# Patient Record
Sex: Female | Born: 1935 | Race: White | Hispanic: No | Smoking: Never smoker
Health system: Southern US, Community
[De-identification: ages and names within clinical notes are randomized; demographics above are authoritative.]

## PROBLEM LIST (undated history)

## (undated) DIAGNOSIS — F410 Panic disorder [episodic paroxysmal anxiety] without agoraphobia: Secondary | ICD-10-CM

## (undated) DIAGNOSIS — I10 Essential (primary) hypertension: Secondary | ICD-10-CM

## (undated) DIAGNOSIS — C801 Malignant (primary) neoplasm, unspecified: Secondary | ICD-10-CM

## (undated) DIAGNOSIS — E119 Type 2 diabetes mellitus without complications: Secondary | ICD-10-CM

## (undated) DIAGNOSIS — E785 Hyperlipidemia, unspecified: Secondary | ICD-10-CM

## (undated) DIAGNOSIS — Z973 Presence of spectacles and contact lenses: Secondary | ICD-10-CM

## (undated) DIAGNOSIS — I471 Supraventricular tachycardia, unspecified: Secondary | ICD-10-CM

## (undated) DIAGNOSIS — E039 Hypothyroidism, unspecified: Secondary | ICD-10-CM

## (undated) HISTORY — PX: ABDOMINAL HYSTERECTOMY: SHX81

## (undated) HISTORY — DX: Hypothyroidism, unspecified: E03.9

## (undated) HISTORY — PX: RECONSTRUCTION BREAST W/ LATISSIMUS DORSI FLAP: SUR1078

## (undated) HISTORY — DX: Essential (primary) hypertension: I10

## (undated) HISTORY — DX: Panic disorder (episodic paroxysmal anxiety): F41.0

## (undated) HISTORY — DX: Malignant (primary) neoplasm, unspecified: C80.1

## (undated) HISTORY — DX: Supraventricular tachycardia: I47.1

## (undated) HISTORY — DX: Supraventricular tachycardia, unspecified: I47.10

## (undated) HISTORY — PX: APPENDECTOMY: SHX54

## (undated) HISTORY — PX: CHOLECYSTECTOMY: SHX55

---

## 1983-12-20 HISTORY — PX: MASTECTOMY: SHX3

## 2000-09-08 ENCOUNTER — Other Ambulatory Visit: Admission: RE | Admit: 2000-09-08 | Discharge: 2000-09-08 | Payer: Self-pay | Admitting: Obstetrics and Gynecology

## 2001-08-30 ENCOUNTER — Ambulatory Visit (HOSPITAL_COMMUNITY): Admission: RE | Admit: 2001-08-30 | Discharge: 2001-08-30 | Payer: Self-pay | Admitting: Gastroenterology

## 2001-08-30 ENCOUNTER — Encounter (INDEPENDENT_AMBULATORY_CARE_PROVIDER_SITE_OTHER): Payer: Self-pay | Admitting: Specialist

## 2003-05-20 ENCOUNTER — Ambulatory Visit (HOSPITAL_COMMUNITY): Admission: RE | Admit: 2003-05-20 | Discharge: 2003-05-20 | Payer: Self-pay | Admitting: Obstetrics and Gynecology

## 2003-05-20 ENCOUNTER — Encounter: Payer: Self-pay | Admitting: Obstetrics and Gynecology

## 2003-05-27 ENCOUNTER — Ambulatory Visit (HOSPITAL_COMMUNITY): Admission: RE | Admit: 2003-05-27 | Discharge: 2003-05-27 | Payer: Self-pay | Admitting: Obstetrics and Gynecology

## 2003-05-27 ENCOUNTER — Encounter: Payer: Self-pay | Admitting: Obstetrics and Gynecology

## 2005-10-26 ENCOUNTER — Ambulatory Visit: Payer: Self-pay | Admitting: Cardiology

## 2005-11-03 ENCOUNTER — Ambulatory Visit: Payer: Self-pay

## 2005-11-03 ENCOUNTER — Encounter: Payer: Self-pay | Admitting: Cardiology

## 2006-07-21 ENCOUNTER — Ambulatory Visit: Payer: Self-pay | Admitting: Cardiology

## 2007-05-01 ENCOUNTER — Ambulatory Visit: Payer: Self-pay | Admitting: Cardiology

## 2007-08-16 ENCOUNTER — Ambulatory Visit: Payer: Self-pay

## 2007-08-16 ENCOUNTER — Ambulatory Visit: Payer: Self-pay | Admitting: Cardiology

## 2007-09-17 ENCOUNTER — Ambulatory Visit: Payer: Self-pay | Admitting: Cardiology

## 2007-10-31 ENCOUNTER — Ambulatory Visit: Payer: Self-pay | Admitting: Cardiology

## 2007-10-31 LAB — CONVERTED CEMR LAB
BUN: 21 mg/dL (ref 6–23)
GFR calc Af Amer: 91 mL/min
GFR calc non Af Amer: 76 mL/min
Potassium: 3.9 meq/L (ref 3.5–5.1)
Sodium: 143 meq/L (ref 135–145)

## 2007-11-02 ENCOUNTER — Ambulatory Visit: Payer: Self-pay | Admitting: Cardiology

## 2008-01-30 ENCOUNTER — Ambulatory Visit: Payer: Self-pay | Admitting: Cardiology

## 2008-10-17 ENCOUNTER — Ambulatory Visit: Payer: Self-pay | Admitting: Cardiology

## 2008-10-29 ENCOUNTER — Ambulatory Visit: Payer: Self-pay | Admitting: Cardiology

## 2009-06-17 ENCOUNTER — Telehealth: Payer: Self-pay | Admitting: Cardiology

## 2009-10-16 DIAGNOSIS — I471 Supraventricular tachycardia, unspecified: Secondary | ICD-10-CM | POA: Insufficient documentation

## 2009-10-16 DIAGNOSIS — E039 Hypothyroidism, unspecified: Secondary | ICD-10-CM | POA: Insufficient documentation

## 2009-10-16 DIAGNOSIS — I10 Essential (primary) hypertension: Secondary | ICD-10-CM

## 2009-10-19 ENCOUNTER — Ambulatory Visit: Payer: Self-pay | Admitting: Cardiology

## 2009-10-19 DIAGNOSIS — E663 Overweight: Secondary | ICD-10-CM | POA: Insufficient documentation

## 2010-01-11 ENCOUNTER — Telehealth: Payer: Self-pay | Admitting: Cardiology

## 2011-01-20 NOTE — Progress Notes (Signed)
Summary: refill--metorpolol  Phone Note Refill Request   Refills Requested: Medication #1:  METOPROLOL TARTRATE 50 MG TABS take one tablet two times a day   Supply Requested: 3 months [facility] on [address]   Method Requested: Fax to Local Pharmacy Initial call taken by: Migdalia Dk,  January 11, 2010 9:18 AM  Follow-up for Phone Call        Rx sent into pharmacy. LMOM for pt. Marrion Coy, CNA  January 11, 2010 12:32 PM  Follow-up by: Marrion Coy, CNA,  January 11, 2010 12:32 PM    Prescriptions: METOPROLOL TARTRATE 50 MG TABS (METOPROLOL TARTRATE) take one tablet two times a day  #180 x 2   Entered by:   Marrion Coy, CNA   Authorized by:   Rollene Rotunda, MD, [facility]   Signed by:   Marrion Coy, CNA on 01/11/2010   Method used:   Electronically to        [facility]  [phone]* (retail)       [address]       [place], [place]  [address]       Ph: [phone] or [phone]       Fax: [phone]   RxID:   [id number]

## 2011-01-26 ENCOUNTER — Other Ambulatory Visit: Payer: Self-pay | Admitting: Family Medicine

## 2011-01-26 DIAGNOSIS — M8580 Other specified disorders of bone density and structure, unspecified site: Secondary | ICD-10-CM

## 2011-02-14 ENCOUNTER — Ambulatory Visit
Admission: RE | Admit: 2011-02-14 | Discharge: 2011-02-14 | Disposition: A | Discharge status: Home / Self Care | Payer: Medicare Other | Source: Ambulatory Visit | Attending: Family Medicine | Admitting: Family Medicine

## 2011-02-14 DIAGNOSIS — M8580 Other specified disorders of bone density and structure, unspecified site: Secondary | ICD-10-CM

## 2011-03-03 ENCOUNTER — Telehealth: Payer: Self-pay | Admitting: Cardiology

## 2011-03-08 NOTE — Progress Notes (Signed)
Summary: refill request  Phone Note Refill Request Message from:  Patient on March 03, 2011 11:13 AM  Refills Requested: Medication #1:  METOPROLOL TARTRATE 50 MG TABS take one tablet two times a day [facility]   Method Requested: Telephone to Pharmacy Initial call taken by: Glynda Jaeger,  March 03, 2011 11:13 AM    Prescriptions: METOPROLOL TARTRATE 50 MG TABS (METOPROLOL TARTRATE) take one tablet two times a day  #180 x 2   Entered by:   Hardin Negus, RMA   Authorized by:   Rollene Rotunda, MD, [facility]   Signed by:   Hardin Negus, RMA on 03/03/2011   Method used:   Electronically to        [facility]  [phone]* (retail)       [address]       [place], [place]  [address]       Ph: [phone] or [phone]       Fax: [phone]   RxID:   [id number]

## 2011-04-09 ENCOUNTER — Encounter: Payer: Self-pay | Admitting: Cardiology

## 2011-04-12 ENCOUNTER — Ambulatory Visit (INDEPENDENT_AMBULATORY_CARE_PROVIDER_SITE_OTHER): Payer: Medicare Other | Admitting: Cardiology

## 2011-04-12 ENCOUNTER — Encounter: Payer: Self-pay | Admitting: Cardiology

## 2011-04-12 DIAGNOSIS — E663 Overweight: Secondary | ICD-10-CM

## 2011-04-12 DIAGNOSIS — I498 Other specified cardiac arrhythmias: Secondary | ICD-10-CM

## 2011-04-12 DIAGNOSIS — I10 Essential (primary) hypertension: Secondary | ICD-10-CM

## 2011-04-12 NOTE — Assessment & Plan Note (Signed)
The blood pressure continues to be high. I have instructed the patient to record a blood pressure diary and recording this. This will be presented for my review and pending these results I will make further suggestions about changes in therapy for optimal blood pressure control.  

## 2011-04-12 NOTE — Patient Instructions (Signed)
Follow up in 6 months with Dr. Hochrein. 

## 2011-04-12 NOTE — Assessment & Plan Note (Addendum)
The patient understands the need to lose weight with diet and exercise. We have discussed specific strategies for this.  We had a very long discussion about the specifics of this I think she is motivated to change. She would like six-month followup to discuss her progress and further plans.

## 2011-04-12 NOTE — Progress Notes (Signed)
HPI The patient presents for followup of hypertension and cardiovascular risk factors. Since I last saw her she has had no new cardiovascular complaints. She has apparently been found to be hyperglycemic. She had her dose of amlodipine reduced because of fatigue. She is doing some exercise but has just gotten back to this. She is not having any chest pressure, neck or arm discomfort. She is not having any palpitations, presyncope or syncope. She is not having any weight gain or edema. Unfortunately she hasn't lost weight and she admits to this being lifestyle with diet in particular.  Allergies  Allergen Reactions  . Penicillins     Current Outpatient Prescriptions  Medication Sig Dispense Refill  . amLODipine (NORVASC) 5 MG tablet Take 5 mg by mouth daily.        Marland Kitchen glimepiride (AMARYL) 1 MG tablet daily      . levothyroxine (SYNTHROID, LEVOTHROID) 88 MCG tablet Daily       . lisinopril-hydrochlorothiazide (PRINZIDE,ZESTORETIC) 20-25 MG per tablet daily      . metFORMIN (GLUCOPHAGE-XR) 500 MG 24 hr tablet Twice daily.      . metoprolol (LOPRESSOR) 50 MG tablet Take 50 mg by mouth 2 (two) times daily.        . pravastatin (PRAVACHOL) 40 MG tablet daily      . DISCONTD: amLODipine (NORVASC) 10 MG tablet Take 10 mg by mouth daily.        Marland Kitchen DISCONTD: hydrochlorothiazide (,MICROZIDE/HYDRODIURIL,) 12.5 MG capsule Take 12.5 mg by mouth daily.          Past Medical History  Diagnosis Date  . Hypertension   . Hypothyroidism   . Panic attacks     mild  . SVT (supraventricular tachycardia)     in the past  . Hx of hysterectomy     Past Surgical History  Procedure Date  . Appendectomy   . Cholecystectomy   . Mastectomy     ROS:  As stated in the HPI and negative for all other systems.  PHYSICAL EXAM BP 140/90  Pulse 98  Ht 5\' 7"  (1.702 m)  Wt 225 lb 6.4 oz (102.241 kg)  BMI 35.30 kg/m2 GENERAL:  Well appearing HEENT:  Pupils equal round and reactive, fundi not visualized, oral  mucosa unremarkable NECK:  No jugular venous distention, waveform within normal limits, carotid upstroke brisk and symmetric, no bruits, no thyromegaly LYMPHATICS:  No cervical, inguinal adenopathy LUNGS:  Clear to auscultation bilaterally BACK:  No CVA tenderness CHEST:  Unremarkable HEART:  PMI not displaced or sustained,S1 and S2 within normal limits, no S3, no S4, no clicks, no rubs, no murmurs ABD:  Flat, positive bowel sounds normal in frequency in pitch, no bruits, no rebound, no guarding, no midline pulsatile mass, no hepatomegaly, no splenomegaly EXT:  2 plus pulses throughout, no edema, no cyanosis no clubbing SKIN:  No rashes no nodules NEURO:  Cranial nerves II through XII grossly intact, motor grossly intact throughout PSYCH:  Cognitively intact, oriented to person place and time  AVW:UJWJX rhythm, rate 98, right axis deviation, first degree AV block, no acute ST-T wave changes  ASSESSMENT AND PLAN

## 2011-04-12 NOTE — Assessment & Plan Note (Signed)
She is having no episodes of this.  No change in therapy is indicated

## 2011-05-03 NOTE — Assessment & Plan Note (Signed)
[facility]                            CARDIOLOGY OFFICE NOTE   Michaela Rhodes, Michaela Rhodes                      MRN:          [id number]  DATE:11/02/2007                            DOB:          1936/07/22    PRIMARY CARE PHYSICIAN:  Molly Maduro L. Foy Guadalajara, M.D.   REASON FOR PRESENTATION:  Evaluate patient with hypertension.   HISTORY OF PRESENT ILLNESS:  Patient is a pleasant 75 year old with  difficult-to-control hypertension.  At the last appointment, I added  hydrochlorothiazide 12.5 mg daily.  She has been keeping a blood  pressure diary, and it has been very well controlled.  She has had some  swings into the 150s, but for the most part, it seems to be 120s-130s.  She has not had any lightheadedness.  She has had no presyncope or  syncope.  She has had no chest discomfort.   PAST MEDICAL HISTORY:  1. Hypertension x10 years.  2. Hypothyroidism.  3. Mild panic attacks.  4. Supraventricular tachycardia.  5. Hysterectomy.  6. Appendectomy in the 1940s.  7. Cholecystectomy in 1961.  8. Breast cancer, resected, with a left mastectomy in 1986.   ALLERGIES:  PENICILLIN.   MEDICATIONS:  1. Metoprolol 50 mg b.i.d.  2. Amlodipine 5 mg daily.  3. Hydrochlorothiazide 12.5 mg daily.   REVIEW OF SYSTEMS:  As stated in the HPI, otherwise negative for other  systems.   PHYSICAL EXAMINATION:  Patient is in no distress.  Blood pressure 147/85, heart rate 71 and regular.  Weight 214 pounds.  Body Mass Index 33.  HEENT:  Eyelids unremarkable.  Pupils are equal, round and reactive to  light.  Fundi not visualized.  Oral mucosa unremarkable.  NECK:  No jugular venous distention at 45 degrees.  Carotid upstroke  brisk and symmetric.  No bruits, no thyromegaly.  LUNGS:  Clear to auscultation bilaterally.  HEART:  PMI not displaced or sustained.  S1 and S2 within normal limits.  No S3, no S4, no clicks, rubs, or murmurs.  ABDOMEN:  Flat, positive bowel sounds.   Normal in frequency and pitch.  No bruits, rebound, guarding.  There are no midline pulsatile masses.  No organomegaly.  EXTREMITIES:  Pulses 2+ throughout.  No edema.   EKG:  Sinus rhythm.  Rate 70.  Axis within normal limits.  Intervals  within normal limits.  No acute ST-T wave changes.   ASSESSMENT/PLAN:  1. Hypertension:  Blood pressure is much better controlled.  At this      point, I will continue the medicines as listed.  She did have labs      demonstrating that she is tolerating hydrochlorothiazide from a      potassium and creatinine standpoint.  2. Followup:  I will bring her back in January for an exercise      treadmill test.     Rollene Rotunda, MD, [facility]  Electronically Signed    JH/MedQ  DD: 11/02/2007  DT: 11/03/2007  Job #: [id number]   cc:   Molly Maduro L. Foy Guadalajara, M.D.

## 2011-05-03 NOTE — Assessment & Plan Note (Signed)
[facility]                            CARDIOLOGY OFFICE NOTE   Michaela Rhodes, Michaela Rhodes                      MRN:          [id number]  DATE:10/17/2008                            DOB:          12-07-1936    PRIMARY CARE PHYSICIAN:  Molly Maduro L. Foy Guadalajara, MD   REASON FOR PRESENTATION:  Evaluate the patient with hypertension.   HISTORY OF PRESENT ILLNESS:  The patient is a now pleasant 75 year old  woman who presents for followup of her hypertension.  Since I last saw  her, she says her blood pressure has been well controlled.  It is  typically in 130s/80s.  It is never over 140 or 90.  She is tolerating  the medicines as listed.  She has had no chest pain or shortness of  breath.  She has not been as active, she use to be.  She cut her finger  and was in a cast for several weeks and so stopped swimming.  She is yet  to get back into this.   Of note, today her EKG demonstrates an atrial tachycardia.  Her rate is  101.  She says she does not feel this.  She does not have any presyncope  or syncope.   PAST MEDICAL HISTORY:  1. Hypertension x10 years.  2. Hypothyroidism.  3. Mild panic attacks.  4. Supraventricular tachycardia in the past.  5. Hysterectomy.  6. Appendectomy in 1940s.  7. Cholecystectomy in 1961.  8. Breast cancer resected with left mastectomy in 1986.   ALLERGIES:  PENICILLIN.   MEDICATIONS:  1. Metoprolol 50 mg twice a day.  2. Amlodipine 5 mg daily.  3. Hydrochlorothiazide 12.5 mg daily.  4. Synthroid 75 mcg daily.   REVIEW OF SYSTEMS:  As stated in the HPI and otherwise negative per  other systems.   PHYSICAL EXAMINATION:  GENERAL:  The patient is a pleasant and in no  distress.  VITAL SIGNS:  Weight 124 pounds (up 10 pounds since her last visit),  blood pressure 139/81, heart rate 101 and regular.  HEENT:  Eyelids are unremarkable, pupils equal, round, and reactive to  light, fundi not visualized, oral mucosa is  unremarkable.  NECK:  No jugular venous distention at 45 degrees, carotid upstroke  brisk and symmetrical, no bruits, no thyromegaly.  LYMPHATICS:  No cervical, axillary, or inguinal adenopathy.  LUNGS:  Clear to auscultation bilaterally.  BACK:  No costovertebral angle tenderness.  CHEST:  Unremarkable.  HEART:  PMI not displaced or sustained, S1 and S2 within normal, no S3,  no S4, no clicks, no rubs, no murmurs.  ABDOMEN:  Flat, positive bowel sounds, normal in frequency and pitch, no  bruits, no rebound, no guarding, no midline pulsatile mass,  no hepatomegaly, no splenomegaly.  SKIN:  No rashes, no nodules.  EXTREMITIES:  Pulses throughout 2+, no edema, no cyanosis, no clubbing.  NEUROLOGIC:  Oriented to person, place, and time, cranial nerves II-XII  grossly intact, motor grossly intact.   EKG sinus rhythm that evolves into the atrial tachycardia with regular  rhythm, the QT appears to be prolonged but  this could represent some of  the infusion of the T-wave and flattening of the QT.  Sinus rhythm with  QT is not prolonged, there is nonspecific T-wave flattening.   ASSESSMENT AND PLAN:  1. Hypertension.  Blood pressure is well controlled.  She will      continue the medications as listed.  2. Obesity.  She has gained weight and she has been inactive.  She is      encouraged to go back to swimming and other activities and try to      bring her weight down.  3. Tachycardia.  I will place 24-hour Holter monitor to make sure she      has no other dysrhythmias.  4. Prolonged QT.  I really think this is an artifact of the      arrhythmia.  Earlier in the EKG when she is in sinus rhythm, her QT      is not particularly prolong.  She does have a first-degree AV      block.  She has not had any tachy palpitations or syncope to      suggest a long QT syndrome.  Previous EKGs have not suggesting the      QT to be prolonged.  We will follow this with EKGs in the future      and I can  also measure on the monitor.  5. Hypothyroidism.  She remains on Synthroid and has this followed.  6. Followup.  We will see her back in 1 year, sooner if she has      anything on Holter that needs to be dressed or any symptomatic      tachy palpitations.     Rollene Rotunda, MD, [facility]  Electronically Signed    JH/MedQ  DD: 10/17/2008  DT: 10/18/2008  Job #: [id number]   cc:   Molly Maduro L. Foy Guadalajara, M.D.

## 2011-05-03 NOTE — Assessment & Plan Note (Signed)
[facility]                            CARDIOLOGY OFFICE NOTE   Michaela Rhodes, Michaela Rhodes                      MRN:          [id number]  DATE:08/16/2007                            DOB:          08-31-1936    PRIMARY CARE PHYSICIAN:  Dr. Marinda Elk.   REASON FOR PRESENTATION:  Evaluate patient with some palpitations and  hypertension.   HISTORY OF PRESENT ILLNESS:  The patient returns for follow up of the  above. At the last appointment, I did take the liberty of increasing her  beta blocker. She has had better blood pressure control following that.  Between this and being off of Synthroid, she has actually had no tachy-  palpitations since I saw her. She was coming in today for an exercise  treadmill test to see if we could induce any of her arrhythmias. She was  anxious about exercising. However, on hooking her up, she was quite  hypertensive. Initially, she was 156/106. I had her repeat this and she  went up to 195/110. She does report that systolics are in the 150s at  home, but diastolics are well controlled. As mentioned, she is not  having any tachy-palpitations. She has had no pre-syncope or syncope.  She has not been having any shortness of breath. She is trying to do a  little water aerobics. She says that when she does exercise, she gets a  musculoskeletal discomfort, but she relates this to breast cancer and  mastectomy where they repaired her chest with muscles from her back. It  feels more like a pulling muscular discomfort.   PAST MEDICAL HISTORY:  Hypertension x10 years, hypothyroidism, mild  panic attacks, supraventricular tachycardia, hysterectomy, appendectomy  in the 1940s, cholecystectomy in 1961, breast cancer resected with left  mastectomy in 1986.   ALLERGIES:  PENICILLIN.   CURRENT MEDICATIONS:  Metoprolol 50 mg b.i.d.   REVIEW OF SYSTEMS:  As stated in the HPI and otherwise negative for  other systems.   PHYSICAL  EXAMINATION:  GENERAL:  The patient is in no distress.  VITAL SIGNS:  Blood pressure 195/100, heart rate 92 and regular.  HEENT:  Eyes unremarkable, pupils equal, round, and reactive to light,  fundi not visualized, oral mucosa unremarkable.  NECK:  No jugular vein distension at 45 degrees, carotid upstroke brisk  and symmetric, no bruits, no thyromegaly.  LUNGS:  Clear to auscultation bilaterally.  HEART:  PMI not displaced or sustained, S1 and S2 within normal limits,  no S3, no S4, no murmurs  ABDOMEN:  Flat, positive bowel sounds, normal to frequency and pitch,  midline bruit loud, no midline pulsatile mass, no hepatomegaly.  SKIN:  No rashes, no nodules.  EXTREMITIES:  2+ pulses, no edema.   EKG:  Sinus rhythm, rate 92, no acute STT wave changes.   ASSESSMENT AND PLAN:  1. Hypertension. The patient's blood pressure is too elevated to do      the treadmill test today. I have taken the liberty of adding      amlodipine 5 mg daily to her regimen. She is going to keep  a blood      pressure check at home. I would like to try to do a treadmill test      again in the future, but not until we have seen her blood pressure      better controlled.  2. Palpitations. She is no longer having these. These can be managed      symptomatically.  3. Follow up. I will see her back with a treadmill test. I will      schedule her in about 6 weeks for a blood pressure check first.     Rollene Rotunda, MD, [facility]  Electronically Signed    JH/MedQ  DD: 08/16/2007  DT: 08/17/2007  Job #: [id number]   cc:   Molly Maduro L. Foy Guadalajara, M.D.

## 2011-05-03 NOTE — Assessment & Plan Note (Signed)
[facility]                            CARDIOLOGY OFFICE NOTE   Michaela Rhodes, Michaela Rhodes                      MRN:          [id number]  DATE:05/01/2007                            DOB:          09/06/1936    REFERRING PHYSICIAN:  Molly Maduro L. Foy Guadalajara, M.D.   REASON FOR PRESENTATION:  Evaluate patient with supraventricular  tachycardia and hypertension.   HISTORY OF PRESENT ILLNESS:  Patient returns for followup.  It has been  a little over a year.  At the initial visit, she had a rapid heart rate.  I put a Holter monitor on her, which demonstrated sinus rhythm and runs  of supraventricular tachycardia.  I managed this with metoprolol 25 mg  b.i.d.  She also had her Synthroid stopped by her primary doctor.  She  has not noticed any symptomatic tachy palpitations.  She has not had any  presyncope or syncope.  She has had no chest discomfort, neck or arm  discomfort.  She has had no shortness of breath, PND, or orthopnea.  She  has not been as active as we would hope.  She stopped working at [employer],  and so does not walk as much in a given day.  She has been a little bit  afraid to because of increased heart rate as well.   PAST MEDICAL HISTORY:  1. Hypertension x10 years, controlled.  2. Hypothyroidism, treated.  3. Mild panic attacks.  4. Supraventricular tachycardia.  5. Hysterectomy.  6. Appendectomy in the 1940s.  7. Cholecystectomy in 1961.  8. Breast cancer resected with left mastectomy in 1986.   ALLERGIES:  PENICILLIN.   MEDICATIONS:  Metoprolol 25 mg b.i.d.   REVIEW OF SYSTEMS:  As stated in the HPI, and otherwise negative for  other systems.   PHYSICAL EXAMINATION:  The patient is in no distress.  Blood pressure 170/110.  Heart rate 70 and regular.  Weight 200 pounds.  Body mass index 33.  HEENT:  Eyes unremarkable.  Pupils equal, round, and reactive to light.  Fundi not visualized.  Oral mucosa unremarkable.  NECK:  No jugular venous  distention.  Wave form within normal limits.  Carotid upstroke brisk and symmetric.  No bruits.  No thyromegaly.  LYMPHATICS:  No cervical, axillary, or inguinal adenopathy.  LUNGS:  Clear to auscultation bilaterally.  BACK:  No costovertebral angle tenderness.  CHEST:  Unremarkable.  HEART:  PMI not displaced or sustained.  S1 and S2 within normal limits.  No S3.  No S4.  No clicks.  No rubs.  No murmurs.  ABDOMEN:  Obese.  Positive bowel sounds.  Normal in frequency and pitch.  No bruits.  No rebound.  No guarding.  No midline pulsatile mass.  No  hepatomegaly.  No splenomegaly.  SKIN:  No rashes.  No nodules.  EXTREMITIES:  Two plus pulses throughout.  No edema.  No cyanosis.  No  clubbing.  NEUROLOGIC:  Oriented to person, place, and time.  Cranial nerves 2  through 12 grossly intact.  Motor grossly intact.   ASSESSMENT AND PLAN:  1. Tachycardia.  The patient  is not having any symptomatic paroxysms      of this.  She did go quickly just when I was listening to her.  She      had been leery of exercising because of this.  I am going to bring      her back and do an exercise treadmill test.  This will be on a      higher dose of beta blocker, 50 mg b.i.d.  This will allow [place] to      look at her heart rate and blood pressure, as well as give her a      prescription for exercise.  2. Hypertension.  This will be managed as above with the increase of      her beta blocker to 50 mg b.i.d.  3. Followup.  We will see the patient back at the time of her      treadmill.  She wants to come back in early July.     Rollene Rotunda, MD, [facility]     JH/MedQ  DD: 05/01/2007  DT: 05/01/2007  Job #: [id number]   cc:   Molly Maduro L. Foy Guadalajara, M.D.

## 2011-05-03 NOTE — Procedures (Signed)
[name]                              EXERCISE TREADMILL   NAME:Michaela Rhodes, Michaela Rhodes                      MRN:          [id number]  DATE:01/30/2008                            DOB:          [birth year]    PROCEDURE:  Exercise treadmill test.   INDICATION:  Evaluate patient with tachycardia, cardiovascular risk  factors and dyspnea.   PROCEDURAL NOTE:  The patient was exercised using standard Bruce  protocol.  Very soon after being put on the he treadmill, she went from  a heart rate of 93 sinus with first degree AV block to a tachycardia  that appeared to have a somewhat gradual onset, but an abrupt end.  It  may have been a reentrant atrial tachycardia.  We stopped the procedure.  The rate at that time was approximately 145, which was above her target.  Again, the rhythm broke very soon after we stopped walking.  We  restarted and saw the same phenomenon.  However, this time the  resolution of the tachycardia was more gradual.  As we watched her at  rest, she had bouts of atrial tachycardia nonsustained.  She never had  any symptoms with any of these.  She did not have any shortness of  breath or chest discomfort.  When she was at her peak heart rate, which  was 142 beats a minute, she had an appropriate blood pressure response  of 175/57.  There were no ischemic ST-T wave changes.  She did have  normal slowing of her heart rate at rest.  There were no premature  ventricular contractions.  The patient overall exercised for only 2  minutes and 59 seconds.   ASSESSMENT/PLAN:  This is technically a negative test as she achieved  target heart rate.  However, it is not particularly helpful in  determining risk as she was exercised for such a short period of time.  Rather this points out the difficulties of her tachyarrhythmia , which  remain predominantly asymptomatic.  She did have a hypertensive blood  pressure response.  At this point I am going to  increase her metoprolol  from 50 twice a day to 100 mg twice a day.  I plan to get a Holter  monitor in one month.  I will then see her after that.     Rollene Rotunda, MD, [facility]  Electronically Signed    JH/MedQ  DD: 01/30/2008  DT: 01/31/2008  Job #: [id number]   cc:   Katrina Stack, Dr.

## 2011-05-03 NOTE — Assessment & Plan Note (Signed)
[facility]                            CARDIOLOGY OFFICE NOTE   Michaela Rhodes, Michaela Rhodes                      MRN:          [id number]  DATE:09/17/2007                            DOB:          10/07/1936    PRIMARY:  Dr. Marinda Elk   REASON FOR PRESENTATION:  Evaluate patient with palpitations and  hypertension.   HISTORY OF PRESENT ILLNESS:  The patient is a 75 year old white female  with difficult-to-control hypertension.  At the last visit I was trying  to do a treadmill test on her but her blood pressure would not allow  this.  I started amlodipine 5 mg.  She has been keeping a blood pressure  diary at home.  Her blood pressure is better controlled but still not at  target.  She is running in the 140s to 160s systolic and her diastolics  are typically in the 90s.  She is not noticing any new palpitations.  She has had no presyncope or syncope.  She has had no chest discomfort,  neck or arm discomfort.  She had had no shortness of breath, PND or  orthopnea.  She tolerated the amlodipine and has not had any lower  extremity swelling.   PAST MEDICAL HISTORY:  1. Hypertension x10 years.  2. Hypothyroidism.  3. Mild panic attacks.  4. Supraventricular tachycardia.  5. Hysterectomy.  6. Appendectomy in the 1940s.  7. Cholecystectomy in 1961.  8. Breast cancer resected with left mastectomy in 1986.   ALLERGIES:  PENICILLIN.   MEDICATIONS:  1. Metoprolol 50 mg b.i.d.  2. Amlodipine 5 mg daily.   REVIEW OF SYSTEMS:  As stated in the HPI and otherwise negative for  other systems.   PHYSICAL EXAMINATION:  The patient is in no distress.  Weight is 212  pounds, body mass index 33, blood pressure 140/100, heart rate 80 and  regular.  HEENT:  Eyelids unremarkable.  Pupils equal, round, and react to light.  Fundi not visualized.  Oral mucosa unremarkable.  NECK:  No jugular venous distention at 45 degrees, carotid upstroke  brisk and symmetric, no  bruits, no thyromegaly.  LYMPHATICS:  No cervical, axillary or inguinal adenopathy.  LUNGS:  Clear to auscultation bilaterally.  BACK:  No costovertebral angle tenderness.  CHEST:  Unremarkable.  HEART:  PMI not displaced or sustained.  S1 and S2 within normal limits.  No S3, no S4.  No clicks, rubs or murmurs.  ABDOMEN:  Obese, positive bowel sounds normal in frequency and pitch.  No bruits, no rebound, no guarding.  No midline pulsatile mass.  No  hepatomegaly, no splenomegaly.  SKIN:  No rashes, no nodules.  EXTREMITIES:  Show 2+ pulses throughout.  No edema, no cyanosis, no  clubbing.  NEUROLOGIC:  Oriented to person, place and time.  Cranial nerves II-XII  grossly intact.  Motor grossly intact.   ASSESSMENT AND PLAN:  1. Hypertension.  Blood pressure is still not at target.  At this      point I am going to institute 12.5 mg of hydrochlorothiazide.  She      had  some problems with that she thought in the past but at a higher      dose.  I do believe that diuretics should be part of any multidrug      regimen unless there are absolutes or strong relative      contraindications.  She and I discussed this.  She will get a BMET      in 2 weeks.  She will keep her blood pressure diary.  2. Palpitations.  She is not having these.  Will manage these      symptomatically.  3. Followup.  I would at some point in time still like to do a      treadmill test and give her a prescription for exercise.  She is      worried about her weight, though she has tried multiple diets.      Exercise is the missing ingredient.   FOLLOWUP:  I will see her back in 2 months to further discuss the blood  pressure and plan for a screening exercise treadmill test.     Rollene Rotunda, MD, [facility]  Electronically Signed    JH/MedQ  DD: 09/17/2007  DT: 09/17/2007  Job #: [id number]   cc:   Molly Maduro L. Foy Guadalajara, M.D.

## 2011-05-06 NOTE — Procedures (Signed)
[facility]  Patient:    Michaela Rhodes, Michaela Rhodes Visit Number: [id number] MRN: [id number]          Service Type: END Location: ENDO Attending Physician:  Nelda Marseille Dictated by:   Petra Kuba, M.D. Proc. Date: 08/30/01 Admit Date:  08/30/2001   CC:         Ranae Plumber, M.D.   Procedure Report  PROCEDURE:  Colonoscopy with polypectomy.  INDICATIONS FOR PROCEDURE:  A patient with resolved anemia, questionable etiology due for colonic screening. Consent was signed after risks, benefits, methods, and options were thoroughly discussed in the office.  MEDICINES USED:  Demerol 50, Versed 6.  DESCRIPTION OF PROCEDURE:  Rectal inspection was pertinent for external hemorrhoids. Digital exam was negative. The video colonoscope was inserted and despite a long looping tortuous colon was able to be advanced to the cecum. This did require rolling her on her back an then back on her left side with various abdominal pressures. The cecum was identified by the appendiceal orifice and the ileocecal valve. In fact, the scope was inserted a short ways into the terminal ileum which was normal on quick evaluation. Photo documentation was obtained. The scope was slowly withdrawn. On insertion, some left sided occasional small diverticula were seen. The scope was slowly withdrawn. The cecum, ascending, transverse and majority of the descending was normal. As the scope was withdrawn around the sigmoid, the occasional diverticula were seen. A small mid sigmoid polyp was seen and was hot biopsied x 1. The scope was slowly withdrawn back to the rectum and no additional findings were seen. The scope was retroflexed pertinent for some internal hemorrhoids. The scope was straightened and readvanced a short ways up the sigmoid, air was suctioned, the scope removed. The patient tolerated the procedure well. There was no obvious or immediate complication.  ENDOSCOPIC  DIAGNOSIS: 1. Internal/external hemorrhoids. 2. Long looping tortuous colon. 3. Occasional left sided diverticula. 4. Tiny sigmoid polyp hot biopsied. 5. Otherwise within normal limits to the cecum and terminal ileum.  PLAN:  Await pathology to determine future colonic screening. Happy to see back p.r.n. otherwise return care to Dr. Foy Guadalajara and Ms. Womble and would recommend a repeat CBC in 1-2 months just to make sure her problem is completely resolved and otherwise recommend yearly rectals and guaiacs per them. Dictated by:   Petra Kuba, M.D. Attending Physician:  Nelda Marseille DD:  08/30/01 TD:  08/30/01 Job: [id number]

## 2012-01-27 LAB — HM MAMMOGRAPHY: HM Mammogram: NEGATIVE

## 2012-05-26 ENCOUNTER — Emergency Department (HOSPITAL_COMMUNITY): Payer: Medicare Other

## 2012-05-26 ENCOUNTER — Inpatient Hospital Stay (HOSPITAL_COMMUNITY)
Admission: EM | Admit: 2012-05-26 | Discharge: 2012-05-30 | DRG: 483 | Disposition: A | Discharge status: Skilled Nursing Facility | Payer: Medicare Other | Attending: Orthopedic Surgery | Admitting: Orthopedic Surgery

## 2012-05-26 ENCOUNTER — Encounter (HOSPITAL_COMMUNITY): Payer: Self-pay | Admitting: Emergency Medicine

## 2012-05-26 DIAGNOSIS — S42301A Unspecified fracture of shaft of humerus, right arm, initial encounter for closed fracture: Secondary | ICD-10-CM

## 2012-05-26 DIAGNOSIS — N179 Acute kidney failure, unspecified: Secondary | ICD-10-CM | POA: Diagnosis present

## 2012-05-26 DIAGNOSIS — E1165 Type 2 diabetes mellitus with hyperglycemia: Secondary | ICD-10-CM | POA: Diagnosis present

## 2012-05-26 DIAGNOSIS — N390 Urinary tract infection, site not specified: Secondary | ICD-10-CM | POA: Diagnosis present

## 2012-05-26 DIAGNOSIS — Y998 Other external cause status: Secondary | ICD-10-CM

## 2012-05-26 DIAGNOSIS — S42201A Unspecified fracture of upper end of right humerus, initial encounter for closed fracture: Secondary | ICD-10-CM | POA: Diagnosis present

## 2012-05-26 DIAGNOSIS — R7309 Other abnormal glucose: Secondary | ICD-10-CM | POA: Diagnosis present

## 2012-05-26 DIAGNOSIS — W108XXA Fall (on) (from) other stairs and steps, initial encounter: Secondary | ICD-10-CM | POA: Diagnosis present

## 2012-05-26 DIAGNOSIS — R000 Tachycardia, unspecified: Secondary | ICD-10-CM | POA: Diagnosis present

## 2012-05-26 DIAGNOSIS — I498 Other specified cardiac arrhythmias: Secondary | ICD-10-CM | POA: Diagnosis present

## 2012-05-26 DIAGNOSIS — D62 Acute posthemorrhagic anemia: Secondary | ICD-10-CM | POA: Diagnosis not present

## 2012-05-26 DIAGNOSIS — R739 Hyperglycemia, unspecified: Secondary | ICD-10-CM | POA: Diagnosis present

## 2012-05-26 DIAGNOSIS — E039 Hypothyroidism, unspecified: Secondary | ICD-10-CM | POA: Diagnosis present

## 2012-05-26 DIAGNOSIS — N189 Chronic kidney disease, unspecified: Secondary | ICD-10-CM | POA: Diagnosis present

## 2012-05-26 DIAGNOSIS — S42209A Unspecified fracture of upper end of unspecified humerus, initial encounter for closed fracture: Principal | ICD-10-CM | POA: Diagnosis present

## 2012-05-26 DIAGNOSIS — IMO0002 Reserved for concepts with insufficient information to code with codable children: Secondary | ICD-10-CM | POA: Diagnosis present

## 2012-05-26 DIAGNOSIS — I10 Essential (primary) hypertension: Secondary | ICD-10-CM | POA: Diagnosis present

## 2012-05-26 HISTORY — DX: Type 2 diabetes mellitus without complications: E11.9

## 2012-05-26 LAB — POCT I-STAT, CHEM 8
BUN: 43 mg/dL — ABNORMAL HIGH (ref 6–23)
Calcium, Ion: 1.14 mmol/L (ref 1.12–1.32)
Chloride: 105 mEq/L (ref 96–112)
Creatinine, Ser: 1.2 mg/dL — ABNORMAL HIGH (ref 0.50–1.10)
Glucose, Bld: 262 mg/dL — ABNORMAL HIGH (ref 70–99)
Potassium: 3.9 mEq/L (ref 3.5–5.1)

## 2012-05-26 LAB — CBC
HCT: 37.7 % (ref 36.0–46.0)
MCHC: 33.4 g/dL (ref 30.0–36.0)
MCV: 84 fL (ref 78.0–100.0)
Platelets: 315 10*3/uL (ref 150–400)
RDW: 13.1 % (ref 11.5–15.5)
WBC: 23.4 10*3/uL — ABNORMAL HIGH (ref 4.0–10.5)

## 2012-05-26 LAB — TYPE AND SCREEN: Antibody Screen: NEGATIVE

## 2012-05-26 MED ORDER — MORPHINE SULFATE 4 MG/ML IJ SOLN
6.0000 mg | Freq: Once | INTRAMUSCULAR | Status: AC
  Administered 2012-05-26: 6 mg via INTRAVENOUS
  Filled 2012-05-26: qty 2

## 2012-05-26 MED ORDER — SODIUM CHLORIDE 0.9 % IV SOLN
INTRAVENOUS | Status: DC
  Administered 2012-05-26: 19:00:00 via INTRAVENOUS

## 2012-05-26 MED ORDER — IOHEXOL 300 MG/ML  SOLN
100.0000 mL | Freq: Once | INTRAMUSCULAR | Status: AC | PRN
  Administered 2012-05-26: 100 mL via INTRAVENOUS

## 2012-05-26 MED ORDER — MORPHINE SULFATE 4 MG/ML IJ SOLN
4.0000 mg | Freq: Once | INTRAMUSCULAR | Status: AC
  Administered 2012-05-26: 4 mg via INTRAVENOUS
  Filled 2012-05-26: qty 1

## 2012-05-26 NOTE — ED Notes (Signed)
Pt lives at home alone. Pt fell down last 4 steps going down stairs. No loc. Bilateral hematomas to eyes. Rt shoulder pain and pain in rt ribs with deep breath

## 2012-05-26 NOTE — ED Notes (Signed)
ZOX:WRUE<AV> Expected date:05/26/12<BR> Expected time: 6:33 PM<BR> Means of arrival:Ambulance<BR> Comments:<BR> M10. 75 f. Fall from 33ft. No LOC, moving everything, on LSB. No bloodthinners or pertinant hx. 10 mins

## 2012-05-26 NOTE — ED Provider Notes (Signed)
History     CSN: [id number]  Arrival date & time 05/26/12  1839   First MD Initiated Contact with Patient 05/26/12 1853      Chief Complaint  Patient presents with  . Fall  . Facial Injury  . Shoulder Injury  . Chest Pain    rt ribs     (Consider location/radiation/quality/duration/timing/severity/associated sxs/prior treatment) HPI Patient reports she fell down 4 steps while at home medially prior to coming here. She does not know why she fell. She thinks she slipped. She denies syncope. Denies loss of consciousness. Complains of right shoulder pain left wrist pain, and pain in right ribs. Felt well prior to event. EMS treated patient with immobilization on long board with hard collar and CID. Pain is nonradiating, moderate to severe no shortness of breath no other associated symptoms. Past Medical History  Diagnosis Date  . Hypertension   . Hypothyroidism   . Panic attacks     mild  . SVT (supraventricular tachycardia)     in the past    Past Surgical History  Procedure Date  . Appendectomy   . Cholecystectomy   . Mastectomy   . Abdominal hysterectomy     Family History  Problem Relation Age of Onset  . Coronary artery disease      family hx of  . Diabetes      family hx of  . Hypertension      family hx of    History  Substance Use Topics  . Smoking status: Never Smoker   . Smokeless tobacco: Not on file  . Alcohol Use: No    OB History    Grav Para Term Preterm Abortions TAB SAB Ect Mult Living                  Review of Systems  Constitutional: Negative.   HENT: Negative.   Respiratory: Negative.   Cardiovascular: Negative.   Gastrointestinal: Negative.   Musculoskeletal: Positive for arthralgias.  Skin: Positive for wound.       Multiple bruises  Neurological: Negative.   Hematological: Negative.   Psychiatric/Behavioral: Negative.     Allergies  Penicillins  Home Medications   Current Outpatient Rx  Name Route Sig Dispense Refill   . AMLODIPINE BESYLATE 5 MG PO TABS Oral Take 5 mg by mouth daily.      Marland Kitchen GLIMEPIRIDE 1 MG PO TABS  daily    . LEVOTHYROXINE SODIUM 88 MCG PO TABS  Daily     . LISINOPRIL-HYDROCHLOROTHIAZIDE 20-25 MG PO TABS  daily    . METFORMIN HCL ER 500 MG PO TB24  Twice daily.    Marland Kitchen METOPROLOL TARTRATE 50 MG PO TABS Oral Take 50 mg by mouth 2 (two) times daily.      Marland Kitchen PRAVASTATIN SODIUM 40 MG PO TABS  daily      There were no vitals taken for this visit.  Physical Exam  Nursing note and vitals reviewed. Constitutional: She is oriented to person, place, and time. She appears well-developed and well-nourished.  HENT:       Positive raccoon eye with abrasions to bilateral temporal areas. Bilateral tympanic membranes normal  Eyes: Conjunctivae are normal. Pupils are equal, round, and reactive to light.  Neck: Neck supple. No tracheal deviation present. No thyromegaly present.  Cardiovascular: Regular rhythm.   No murmur heard.      Tachycardic  Pulmonary/Chest: Effort normal and breath sounds normal.  Abdominal: Soft. Bowel sounds are normal. She exhibits no  distension. There is no tenderness.  Musculoskeletal: Normal range of motion. She exhibits tenderness. She exhibits no edema.       Right upper extremity tender at mid upper arm. Radial pulse 2+ right lower extremity there is a deep abrasion at the proximal shin approximately 1 cm in diameter with mild corresponding tenderness DP pulse 2 plus left upper extremity mild tenderness at rest no deformity radial pulse 2+ left lower extremity without redness swelling or tenderness neurovascularly intact pelvis stable nontender  entire spine nontender  Neurological: She is alert and oriented to person, place, and time. Coordination normal.       Moves all extremities motor strength 5 over 5 overall  Skin: Skin is warm and dry. No rash noted.  Psychiatric: She has a normal mood and affect.    Date: 05/26/2012  Rate: 120  Rhythm: sinus tachycardia  QRS  Axis: normal  Intervals: normal  ST/T Wave abnormalities: normal  Conduction Disutrbances:none  Narrative Interpretation:   Old EKG Reviewed: none available  ED Course  Procedures (including critical care time) 7:10 PM spoke with patient's daughter via phone with patient's permission patient's daughter is a physician who states patient's baseline pulse is in the 110's,   10:20 PM nurse were admitted patient's right shoulder was fully flexed so that that her hand was behind her head.. Procedure: Time outcalled I gently reduced the patient's shoulder the anatomic position without premedication with minimal discomfort to the patient.  . Radial pulse 2+ post reduction. She was placed in a shoulder immobilizer Spoke with Dr. Dion Saucier who came to evaluate patient and made arrangements for transfer to [facility] [place]. At 11:45 PM she remains alert appropriate Glasgow Coma Score 15. She continues to complain of severe right shoulder pain for multiple doses of intravenous morphine Dr. Dion Saucier was present and has taken over care Spoke with Pts Daughter and son at length to advise them of pts condition and need for admission  Labs Reviewed  CBC  TYPE AND SCREEN  URINALYSIS, ROUTINE W REFLEX MICROSCOPIC   No results found.   No diagnosis found.  X-rays reviewed by me  MDM  Patient will need ORIF Diagnosis #1 fall #2 fracture right humerus #3 minor closed head trauma #4 contusions to multiple sites CRITICAL CARE Performed by: Doug Sou   Total critical care time: 30 minute  Critical care time was exclusive of separately billable procedures and treating other patients.  Critical care was necessary to treat or prevent imminent or life-threatening deterioration.  Critical care was time spent personally by me on the following activities: development of treatment plan with patient and/or surrogate as well as nursing, discussions with consultants, evaluation of patient's response to  treatment, examination of patient, obtaining history from patient or surrogate, ordering and performing treatments and interventions, ordering and review of laboratory studies, ordering and review of radiographic studies, pulse oximetry and re-evaluation of patient's condition.       Doug Sou, MD 05/26/12 2350

## 2012-05-26 NOTE — ED Notes (Signed)
Pt requests [place] to wait for pain meds to kick in before we get labs.  RN made aware.

## 2012-05-26 NOTE — ED Notes (Signed)
Ortho tech bedside 

## 2012-05-26 NOTE — ED Notes (Signed)
Ortho MD bedside.

## 2012-05-26 NOTE — ED Notes (Signed)
Patient transported to CT 

## 2012-05-26 NOTE — ED Notes (Signed)
Pt returns to room 

## 2012-05-26 NOTE — H&P (Signed)
ADMISSION H&P  Chief Complaint: Fall with right shoulder pain.  HPI: Michaela Rhodes is a 76 y.o. female who presented to the [facility] room for ligation of her right shoulder after she fell down 4 steps. She does not exactly remember how she fell, or why she fell, but and she denies loss of consciousness. She complains of pain, particularly over her right shoulder, rated 10/10, as well as diffusely, round her body as she says she "hurts all over". She apparently has had potential weight loss, at least 25-30 pounds, over the course of the past 6 months, of unexplained origin. The family is concerned because her blood sugars and thyroid have been in questionable control recently. She is right-hand dominant, and lives at home, and is very active.  Past Medical History  Diagnosis Date  . Hypertension   . Hypothyroidism   . Panic attacks     mild  . SVT (supraventricular tachycardia)     in the past   Past Surgical History  Procedure Date  . Appendectomy   . Cholecystectomy   . Mastectomy   . Abdominal hysterectomy    History   Social History  . Marital Status: Widowed    Spouse Name: N/A    Number of Children: N/A  . Years of Education: N/A   Social History Main Topics  . Smoking status: Never Smoker   . Smokeless tobacco: None  . Alcohol Use: No  . Drug Use: No  . Sexually Active: None   Other Topics Concern  . None   Social History Narrative  . None   Family History  Problem Relation Age of Onset  . Coronary artery disease      family hx of  . Diabetes      family hx of  . Hypertension      family hx of   Allergies  Allergen Reactions  . Penicillins Anaphylaxis and Hives   Prior to Admission medications   Medication Sig Start Date End Date Taking? Authorizing Provider  metoprolol (LOPRESSOR) 50 MG tablet Take 50 mg by mouth 2 (two) times daily.     Yes Historical Provider, MD  amLODipine (NORVASC) 5 MG tablet Take 5 mg by mouth daily.       Historical Provider, MD  glimepiride (AMARYL) 1 MG tablet daily 01/26/11   Historical Provider, MD  levothyroxine (SYNTHROID, LEVOTHROID) 88 MCG tablet Daily  01/26/11   Historical Provider, MD  lisinopril-hydrochlorothiazide (PRINZIDE,ZESTORETIC) 20-25 MG per tablet daily 01/26/11   Historical Provider, MD  metFORMIN (GLUCOPHAGE-XR) 500 MG 24 hr tablet Twice daily. 01/26/11   Historical Provider, MD  pravastatin (PRAVACHOL) 40 MG tablet daily 02/14/11   Historical Provider, MD     Positive ROS: All other systems have been reviewed and were otherwise negative with the exception of those mentioned in the HPI and as above.  Physical Exam: General: Alert, no acute distress. Her neck is not significantly tender. Cardiovascular: No pedal edema she does have tachycardia, with a pulse of 115. According to the daughter this is her baseline pulse rate. Respiratory: No cyanosis, no use of accessory musculature GI: No organomegaly, abdomen is soft and non-tender Skin: No lesions in the area of chief complaint with the exception of bruising over the right shoulder. She also has some bruising and ecchymosis around her face and head.  Neurologic: Sensation intact distally Psychiatric: Patient is competent for consent with normal mood and affect Lymphatic: No axillary or cervical lymphadenopathy  MUSCULOSKELETAL: Right  hand has bounding radial pulse, and all fingers do flex extend abduct. Sensation is intact throughout all nerve distributions. She is unable to lift her arm.  Assessment: Severely displaced comminuted right proximal humerus fracture, status post fall, with coexisting comorbidities including thyroid disease, diabetes, and recent weight loss as well as chronic tachycardia.  Plan: This is an acute severe complicated injury, and carries the risk for long-term morbidity, as well as loss of function. I've discussed the options with her, and her family, and recommended surgical intervention with either  open reduction internal fixation, or hemiarthroplasty versus reverse total shoulder replacement. There is substantial comminution based on the CT scan, and it may be that a were shoulder replacement will be the most predictable outcome, cement we can avoid the complications. I've discussed this with her at length.  I have also spoken at length with her daughter, who is currently in [place], who is a emergency room physician, telephone [phone]. Her name is Toniann Fail. We will be involving the medical team, for consultation, for management of her diabetes, as well as her thyroid, and to assess the possibility of some contributing medical factors to her recent falls.    [name], MD 05/26/2012 11:49 PM

## 2012-05-27 ENCOUNTER — Encounter (HOSPITAL_COMMUNITY): Payer: Self-pay | Admitting: Internal Medicine

## 2012-05-27 ENCOUNTER — Inpatient Hospital Stay (HOSPITAL_COMMUNITY): Payer: Medicare Other | Admitting: Certified Registered"

## 2012-05-27 ENCOUNTER — Encounter (HOSPITAL_COMMUNITY)
Admission: EM | Disposition: A | Discharge status: Skilled Nursing Facility | Payer: Self-pay | Source: Home / Self Care | Attending: Orthopedic Surgery

## 2012-05-27 ENCOUNTER — Encounter (HOSPITAL_COMMUNITY): Payer: Self-pay | Admitting: Certified Registered"

## 2012-05-27 ENCOUNTER — Inpatient Hospital Stay (HOSPITAL_COMMUNITY): Payer: Medicare Other

## 2012-05-27 DIAGNOSIS — E782 Mixed hyperlipidemia: Secondary | ICD-10-CM

## 2012-05-27 DIAGNOSIS — R7989 Other specified abnormal findings of blood chemistry: Secondary | ICD-10-CM

## 2012-05-27 DIAGNOSIS — R55 Syncope and collapse: Secondary | ICD-10-CM

## 2012-05-27 DIAGNOSIS — R739 Hyperglycemia, unspecified: Secondary | ICD-10-CM | POA: Diagnosis present

## 2012-05-27 DIAGNOSIS — R000 Tachycardia, unspecified: Secondary | ICD-10-CM | POA: Diagnosis present

## 2012-05-27 HISTORY — PX: TOTAL SHOULDER ARTHROPLASTY: SHX126

## 2012-05-27 LAB — URINE MICROSCOPIC-ADD ON

## 2012-05-27 LAB — URINALYSIS, ROUTINE W REFLEX MICROSCOPIC
Bilirubin Urine: NEGATIVE
Ketones, ur: 15 mg/dL — AB
Specific Gravity, Urine: 1.033 — ABNORMAL HIGH (ref 1.005–1.030)
pH: 5 (ref 5.0–8.0)

## 2012-05-27 SURGERY — OPEN REDUCTION INTERNAL FIXATION (ORIF) SHOULDER FRACTURE
Anesthesia: General | Laterality: Right

## 2012-05-27 SURGERY — OPEN REDUCTION INTERNAL FIXATION (ORIF) SHOULDER FRACTURE
Anesthesia: General | Site: Shoulder | Laterality: Right | Wound class: Clean

## 2012-05-27 SURGERY — ARTHROPLASTY, SHOULDER, TOTAL
Anesthesia: General | Site: Shoulder | Laterality: Right | Wound class: Clean

## 2012-05-27 MED ORDER — OXYCODONE-ACETAMINOPHEN 10-325 MG PO TABS: 1.0000 | ORAL_TABLET | Freq: Four times a day (QID) | ORAL | Status: AC | PRN

## 2012-05-27 MED ORDER — 0.9 % SODIUM CHLORIDE (POUR BTL) OPTIME
TOPICAL | Status: DC | PRN
  Administered 2012-05-27: 1000 mL

## 2012-05-27 MED ORDER — METHOCARBAMOL 500 MG PO TABS
500.0000 mg | ORAL_TABLET | Freq: Four times a day (QID) | ORAL | Status: DC | PRN
  Filled 2012-05-27: qty 1

## 2012-05-27 MED ORDER — ONDANSETRON HCL 4 MG/2ML IJ SOLN
INTRAMUSCULAR | Status: DC | PRN
  Administered 2012-05-27: 4 mg via INTRAVENOUS

## 2012-05-27 MED ORDER — MIDAZOLAM HCL 5 MG/5ML IJ SOLN
INTRAMUSCULAR | Status: DC | PRN
  Administered 2012-05-27: 1 mg via INTRAVENOUS

## 2012-05-27 MED ORDER — AMLODIPINE BESYLATE 5 MG PO TABS
5.0000 mg | ORAL_TABLET | Freq: Every day | ORAL | Status: DC
  Administered 2012-05-27 – 2012-05-30 (×3): 5 mg via ORAL
  Filled 2012-05-27 (×4): qty 1

## 2012-05-27 MED ORDER — ZOLPIDEM TARTRATE 5 MG PO TABS: 5.0000 mg | ORAL_TABLET | Freq: Every evening | ORAL | Status: DC | PRN

## 2012-05-27 MED ORDER — ACETAMINOPHEN 325 MG PO TABS
650.0000 mg | ORAL_TABLET | Freq: Four times a day (QID) | ORAL | Status: DC | PRN
  Filled 2012-05-27 (×2): qty 2

## 2012-05-27 MED ORDER — INSULIN ASPART 100 UNIT/ML ~~LOC~~ SOLN: 0.0000 [IU] | Freq: Three times a day (TID) | SUBCUTANEOUS | Status: DC

## 2012-05-27 MED ORDER — HYDROMORPHONE HCL PF 1 MG/ML IJ SOLN: 1.0000 mg | INTRAMUSCULAR | Status: DC | PRN

## 2012-05-27 MED ORDER — PHENOL 1.4 % MT LIQD: 1.0000 | OROMUCOSAL | Status: DC | PRN

## 2012-05-27 MED ORDER — LACTATED RINGERS IV SOLN
INTRAVENOUS | Status: DC | PRN
  Administered 2012-05-27 (×3): via INTRAVENOUS

## 2012-05-27 MED ORDER — METHOCARBAMOL 100 MG/ML IJ SOLN
500.0000 mg | Freq: Four times a day (QID) | INTRAVENOUS | Status: DC | PRN
  Filled 2012-05-27: qty 5

## 2012-05-27 MED ORDER — ONDANSETRON HCL 4 MG/2ML IJ SOLN: 4.0000 mg | Freq: Four times a day (QID) | INTRAMUSCULAR | Status: DC | PRN

## 2012-05-27 MED ORDER — LEVOFLOXACIN IN D5W 500 MG/100ML IV SOLN
500.0000 mg | INTRAVENOUS | Status: DC
  Administered 2012-05-27 – 2012-05-29 (×3): 500 mg via INTRAVENOUS
  Filled 2012-05-27 (×3): qty 100

## 2012-05-27 MED ORDER — MORPHINE SULFATE 2 MG/ML IJ SOLN
INTRAMUSCULAR | Status: AC
  Administered 2012-05-27: 2 mg via INTRAVENOUS
  Filled 2012-05-27: qty 1

## 2012-05-27 MED ORDER — LEVOTHYROXINE SODIUM 88 MCG PO TABS: 88.0000 ug | ORAL_TABLET | Freq: Every day | ORAL | Status: DC

## 2012-05-27 MED ORDER — SENNA 8.6 MG PO TABS
1.0000 | ORAL_TABLET | Freq: Two times a day (BID) | ORAL | Status: DC
  Administered 2012-05-27 – 2012-05-30 (×6): 8.6 mg via ORAL
  Filled 2012-05-27 (×7): qty 1

## 2012-05-27 MED ORDER — HYDROCODONE-ACETAMINOPHEN 10-325 MG PO TABS: 1.0000 | ORAL_TABLET | ORAL | Status: DC | PRN

## 2012-05-27 MED ORDER — LEVOTHYROXINE SODIUM 88 MCG PO TABS
88.0000 ug | ORAL_TABLET | Freq: Every day | ORAL | Status: DC
  Administered 2012-05-27 – 2012-05-30 (×4): 88 ug via ORAL
  Filled 2012-05-27 (×5): qty 1

## 2012-05-27 MED ORDER — CALCIUM CARBONATE-VITAMIN D 500-200 MG-UNIT PO TABS: 1.0000 | ORAL_TABLET | Freq: Every day | ORAL | Status: AC

## 2012-05-27 MED ORDER — METOPROLOL TARTRATE 50 MG PO TABS
50.0000 mg | ORAL_TABLET | Freq: Two times a day (BID) | ORAL | Status: DC
  Administered 2012-05-27 – 2012-05-30 (×7): 50 mg via ORAL
  Filled 2012-05-27 (×9): qty 1

## 2012-05-27 MED ORDER — ACETAMINOPHEN 650 MG RE SUPP: 650.0000 mg | Freq: Four times a day (QID) | RECTAL | Status: DC | PRN

## 2012-05-27 MED ORDER — LISINOPRIL 20 MG PO TABS
20.0000 mg | ORAL_TABLET | Freq: Every day | ORAL | Status: DC
  Administered 2012-05-27 – 2012-05-30 (×3): 20 mg via ORAL
  Filled 2012-05-27 (×4): qty 1

## 2012-05-27 MED ORDER — HETASTARCH-ELECTROLYTES 6 % IV SOLN
INTRAVENOUS | Status: DC | PRN
  Administered 2012-05-27: 11:00:00 via INTRAVENOUS

## 2012-05-27 MED ORDER — OXYCODONE HCL 5 MG PO TABS
5.0000 mg | ORAL_TABLET | ORAL | Status: DC | PRN
  Administered 2012-05-28: 10 mg via ORAL
  Filled 2012-05-27: qty 2

## 2012-05-27 MED ORDER — SIMVASTATIN 20 MG PO TABS
20.0000 mg | ORAL_TABLET | Freq: Every day | ORAL | Status: DC
  Administered 2012-05-27 – 2012-05-29 (×3): 20 mg via ORAL
  Filled 2012-05-27 (×4): qty 1

## 2012-05-27 MED ORDER — DIPHENHYDRAMINE HCL 12.5 MG/5ML PO ELIX
12.5000 mg | ORAL_SOLUTION | ORAL | Status: DC | PRN
  Filled 2012-05-27: qty 10

## 2012-05-27 MED ORDER — METOCLOPRAMIDE HCL 5 MG PO TABS
5.0000 mg | ORAL_TABLET | Freq: Three times a day (TID) | ORAL | Status: DC | PRN
  Filled 2012-05-27: qty 2

## 2012-05-27 MED ORDER — ACETAMINOPHEN 325 MG PO TABS: 650.0000 mg | ORAL_TABLET | Freq: Four times a day (QID) | ORAL | Status: DC | PRN

## 2012-05-27 MED ORDER — DOCUSATE SODIUM 100 MG PO CAPS
100.0000 mg | ORAL_CAPSULE | Freq: Two times a day (BID) | ORAL | Status: DC
  Administered 2012-05-27 – 2012-05-30 (×6): 100 mg via ORAL
  Filled 2012-05-27 (×7): qty 1

## 2012-05-27 MED ORDER — CEFAZOLIN SODIUM 1-5 GM-% IV SOLN
INTRAVENOUS | Status: AC
  Filled 2012-05-27: qty 100

## 2012-05-27 MED ORDER — HYDROCHLOROTHIAZIDE 25 MG PO TABS
25.0000 mg | ORAL_TABLET | Freq: Every day | ORAL | Status: DC
  Administered 2012-05-29 – 2012-05-30 (×2): 25 mg via ORAL
  Filled 2012-05-27 (×5): qty 1

## 2012-05-27 MED ORDER — ACETAMINOPHEN 10 MG/ML IV SOLN
1000.0000 mg | Freq: Once | INTRAVENOUS | Status: AC | PRN
  Filled 2012-05-27: qty 100

## 2012-05-27 MED ORDER — PHENYLEPHRINE HCL 10 MG/ML IJ SOLN
10.0000 mg | INTRAVENOUS | Status: DC | PRN
  Administered 2012-05-27: 10 ug/min via INTRAVENOUS
  Administered 2012-05-27: 11:00:00 via INTRAVENOUS

## 2012-05-27 MED ORDER — DOCUSATE SODIUM 100 MG PO CAPS
100.0000 mg | ORAL_CAPSULE | Freq: Two times a day (BID) | ORAL | Status: DC
  Filled 2012-05-27 (×2): qty 1

## 2012-05-27 MED ORDER — METOPROLOL TARTRATE 50 MG PO TABS: 50.0000 mg | ORAL_TABLET | Freq: Two times a day (BID) | ORAL | Status: DC

## 2012-05-27 MED ORDER — METOCLOPRAMIDE HCL 5 MG/ML IJ SOLN
5.0000 mg | Freq: Three times a day (TID) | INTRAMUSCULAR | Status: DC | PRN
  Filled 2012-05-27: qty 2

## 2012-05-27 MED ORDER — PROPOFOL 10 MG/ML IV EMUL
INTRAVENOUS | Status: DC | PRN
  Administered 2012-05-27: 130 mg via INTRAVENOUS

## 2012-05-27 MED ORDER — ONDANSETRON HCL 4 MG PO TABS: 4.0000 mg | ORAL_TABLET | Freq: Four times a day (QID) | ORAL | Status: DC | PRN

## 2012-05-27 MED ORDER — SIMVASTATIN 20 MG PO TABS: 20.0000 mg | ORAL_TABLET | Freq: Every day | ORAL | Status: DC

## 2012-05-27 MED ORDER — HYDROMORPHONE HCL PF 1 MG/ML IJ SOLN
0.5000 mg | INTRAMUSCULAR | Status: DC | PRN
  Administered 2012-05-27: 1 mg via INTRAVENOUS
  Filled 2012-05-27: qty 1

## 2012-05-27 MED ORDER — FENTANYL CITRATE 0.05 MG/ML IJ SOLN
INTRAMUSCULAR | Status: DC | PRN
Start: 2012-05-27 — End: 2012-05-27
  Administered 2012-05-27: 100 ug via INTRAVENOUS
  Administered 2012-05-27: 50 ug via INTRAVENOUS

## 2012-05-27 MED ORDER — CEFAZOLIN SODIUM-DEXTROSE 2-3 GM-% IV SOLR
2.0000 g | Freq: Four times a day (QID) | INTRAVENOUS | Status: AC
  Administered 2012-05-27 – 2012-05-28 (×3): 2 g via INTRAVENOUS
  Filled 2012-05-27 (×3): qty 50

## 2012-05-27 MED ORDER — ALUM & MAG HYDROXIDE-SIMETH 200-200-20 MG/5ML PO SUSP: 30.0000 mL | ORAL | Status: DC | PRN

## 2012-05-27 MED ORDER — SODIUM CHLORIDE 0.9 % IV SOLN: INTRAVENOUS | Status: DC

## 2012-05-27 MED ORDER — ROCURONIUM BROMIDE 100 MG/10ML IV SOLN
INTRAVENOUS | Status: DC | PRN
  Administered 2012-05-27: 50 mg via INTRAVENOUS

## 2012-05-27 MED ORDER — PHENYLEPHRINE HCL 10 MG/ML IJ SOLN
INTRAMUSCULAR | Status: DC | PRN
  Administered 2012-05-27 (×2): 80 ug via INTRAVENOUS
  Administered 2012-05-27: 120 ug via INTRAVENOUS
  Administered 2012-05-27: 80 ug via INTRAVENOUS
  Administered 2012-05-27: 120 ug via INTRAVENOUS

## 2012-05-27 MED ORDER — GLIMEPIRIDE 1 MG PO TABS
1.0000 mg | ORAL_TABLET | Freq: Every day | ORAL | Status: DC
  Filled 2012-05-27 (×2): qty 1

## 2012-05-27 MED ORDER — METOCLOPRAMIDE HCL 5 MG/ML IJ SOLN: 5.0000 mg | Freq: Three times a day (TID) | INTRAMUSCULAR | Status: DC | PRN

## 2012-05-27 MED ORDER — METHOCARBAMOL 500 MG PO TABS: 500.0000 mg | ORAL_TABLET | Freq: Four times a day (QID) | ORAL | Status: AC

## 2012-05-27 MED ORDER — MORPHINE SULFATE 2 MG/ML IJ SOLN: 1.0000 mg | INTRAMUSCULAR | Status: DC | PRN

## 2012-05-27 MED ORDER — MENTHOL 3 MG MT LOZG: 1.0000 | LOZENGE | OROMUCOSAL | Status: DC | PRN

## 2012-05-27 MED ORDER — HYDROMORPHONE HCL PF 1 MG/ML IJ SOLN: 0.2500 mg | INTRAMUSCULAR | Status: DC | PRN

## 2012-05-27 MED ORDER — ONDANSETRON HCL 4 MG/2ML IJ SOLN
4.0000 mg | Freq: Four times a day (QID) | INTRAMUSCULAR | Status: DC | PRN
  Filled 2012-05-27: qty 2

## 2012-05-27 MED ORDER — OXYCODONE-ACETAMINOPHEN 5-325 MG PO TABS
1.0000 | ORAL_TABLET | ORAL | Status: DC | PRN
  Administered 2012-05-27 – 2012-05-29 (×4): 2 via ORAL
  Administered 2012-05-30 (×2): 1 via ORAL
  Filled 2012-05-27: qty 1
  Filled 2012-05-27: qty 2
  Filled 2012-05-27: qty 1
  Filled 2012-05-27 (×3): qty 2

## 2012-05-27 MED ORDER — POTASSIUM CHLORIDE IN NACL 20-0.45 MEQ/L-% IV SOLN
INTRAVENOUS | Status: DC
  Administered 2012-05-27 – 2012-05-29 (×4): via INTRAVENOUS
  Filled 2012-05-27 (×6): qty 1000

## 2012-05-27 MED ORDER — CEFAZOLIN SODIUM 1-5 GM-% IV SOLN
INTRAVENOUS | Status: DC | PRN
  Administered 2012-05-27: 2 g via INTRAVENOUS

## 2012-05-27 MED ORDER — LISINOPRIL-HYDROCHLOROTHIAZIDE 20-25 MG PO TABS: 1.0000 | ORAL_TABLET | Freq: Every day | ORAL | Status: DC

## 2012-05-27 MED ORDER — INSULIN ASPART 100 UNIT/ML ~~LOC~~ SOLN
0.0000 [IU] | Freq: Three times a day (TID) | SUBCUTANEOUS | Status: DC
  Administered 2012-05-27: 2 [IU] via SUBCUTANEOUS
  Administered 2012-05-28: 3 [IU] via SUBCUTANEOUS
  Administered 2012-05-28 (×2): 2 [IU] via SUBCUTANEOUS
  Administered 2012-05-29: 3 [IU] via SUBCUTANEOUS
  Administered 2012-05-29: 2 [IU] via SUBCUTANEOUS
  Administered 2012-05-29: 3 [IU] via SUBCUTANEOUS
  Administered 2012-05-30: 2 [IU] via SUBCUTANEOUS
  Administered 2012-05-30: 3 [IU] via SUBCUTANEOUS

## 2012-05-27 MED ORDER — ONDANSETRON HCL 4 MG/2ML IJ SOLN: 4.0000 mg | Freq: Once | INTRAMUSCULAR | Status: AC | PRN

## 2012-05-27 MED ORDER — METOCLOPRAMIDE HCL 5 MG PO TABS: 5.0000 mg | ORAL_TABLET | Freq: Three times a day (TID) | ORAL | Status: DC | PRN

## 2012-05-27 MED ORDER — MORPHINE SULFATE 2 MG/ML IJ SOLN
2.0000 mg | Freq: Once | INTRAMUSCULAR | Status: AC
  Administered 2012-05-27: 2 mg via INTRAVENOUS

## 2012-05-27 MED ORDER — SENNA 8.6 MG PO TABS
1.0000 | ORAL_TABLET | Freq: Two times a day (BID) | ORAL | Status: DC
  Filled 2012-05-27 (×2): qty 1

## 2012-05-27 MED ORDER — POTASSIUM CHLORIDE IN NACL 20-0.45 MEQ/L-% IV SOLN
INTRAVENOUS | Status: DC
  Administered 2012-05-27: 03:00:00 via INTRAVENOUS
  Filled 2012-05-27 (×3): qty 1000

## 2012-05-27 SURGICAL SUPPLY — 60 items
BENZOIN TINCTURE PRP APPL 2/3 (GAUZE/BANDAGES/DRESSINGS) ×2 IMPLANT
BIT DRILL TWIST 2.7 (BIT) ×2 IMPLANT
CEMENT BONE DEPUY (Cement) ×2 IMPLANT
CEMENT RESTRICTOR DEPUY SZ 4 (Cement) ×2 IMPLANT
CLEANER TIP ELECTROSURG 2X2 (MISCELLANEOUS) IMPLANT
CLOTH BEACON ORANGE TIMEOUT ST (SAFETY) ×2 IMPLANT
CLSR STERI-STRIP ANTIMIC 1/2X4 (GAUZE/BANDAGES/DRESSINGS) ×2 IMPLANT
COVER SURGICAL LIGHT HANDLE (MISCELLANEOUS) ×2 IMPLANT
DRAPE C-ARM 42X72 X-RAY (DRAPES) ×2 IMPLANT
DRAPE INCISE IOBAN 66X45 STRL (DRAPES) ×2 IMPLANT
DRAPE U-SHAPE 47X51 STRL (DRAPES) ×2 IMPLANT
DRSG MEPILEX BORDER 4X8 (GAUZE/BANDAGES/DRESSINGS) ×2 IMPLANT
DURAPREP 26ML APPLICATOR (WOUND CARE) ×2 IMPLANT
ELECT NEEDLE BLADE 2-5/6 (NEEDLE) ×2 IMPLANT
ELECT REM PT RETURN 9FT ADLT (ELECTROSURGICAL)
ELECTRODE REM PT RTRN 9FT ADLT (ELECTROSURGICAL) IMPLANT
GAUZE XEROFORM 1X8 LF (GAUZE/BANDAGES/DRESSINGS) ×2 IMPLANT
GLOVE BIOGEL PI IND STRL 8 (GLOVE) ×2 IMPLANT
GLOVE BIOGEL PI INDICATOR 8 (GLOVE) ×2
GLOVE ORTHO TXT STRL SZ7.5 (GLOVE) ×6 IMPLANT
GLOVE SURG ORTHO 8.0 STRL STRW (GLOVE) ×4 IMPLANT
GOWN STRL NON-REIN LRG LVL3 (GOWN DISPOSABLE) IMPLANT
HANDPIECE INTERPULSE COAX TIP (DISPOSABLE) ×1
KIT BASIN OR (CUSTOM PROCEDURE TRAY) ×2 IMPLANT
KIT ROOM TURNOVER OR (KITS) ×2 IMPLANT
MANIFOLD NEPTUNE II (INSTRUMENTS) ×2 IMPLANT
NEEDLE HYPO 25GX1X1/2 BEV (NEEDLE) IMPLANT
NS IRRIG 1000ML POUR BTL (IV SOLUTION) ×2 IMPLANT
PACK SHOULDER (CUSTOM PROCEDURE TRAY) ×2 IMPLANT
PAD ARMBOARD 7.5X6 YLW CONV (MISCELLANEOUS) ×4 IMPLANT
PIN THREADED REVERSE (PIN) ×2 IMPLANT
SCREW  TITAN STRL 6.5MMX30MM (Screw) ×2 IMPLANT
SCREW BONE LOCKING 30MMX4.75MM (Screw) ×2 IMPLANT
SCREW LOCKING 15MMX4.7MM (Screw) ×4 IMPLANT
SCREW LOCKING 4.75MMX35MM (Screw) ×2 IMPLANT
SET HNDPC FAN SPRY TIP SCT (DISPOSABLE) ×1 IMPLANT
SPONGE GAUZE 4X4 12PLY (GAUZE/BANDAGES/DRESSINGS) ×4 IMPLANT
SPONGE LAP 18X18 X RAY DECT (DISPOSABLE) ×4 IMPLANT
SPONGE LAP 4X18 X RAY DECT (DISPOSABLE) IMPLANT
STAPLER VISISTAT 35W (STAPLE) IMPLANT
STRIP CLOSURE SKIN 1/2X4 (GAUZE/BANDAGES/DRESSINGS) ×2 IMPLANT
SUCTION FRAZIER TIP 10 FR DISP (SUCTIONS) ×2 IMPLANT
SUPPORT WRAP ARM LG (MISCELLANEOUS) ×2 IMPLANT
SUT FIBERWIRE #2 38 REV NDL BL (SUTURE) ×14
SUT FIBERWIRE #2 38 T-5 BLUE (SUTURE)
SUT MNCRL AB 4-0 PS2 18 (SUTURE) ×2 IMPLANT
SUT VIC AB 0 CTB1 27 (SUTURE) IMPLANT
SUT VIC AB 2-0 CT1 36 (SUTURE) ×2 IMPLANT
SUT VIC AB 3-0 FS2 27 (SUTURE) ×2 IMPLANT
SUT VIC AB 3-0 SH 18 (SUTURE) ×2 IMPLANT
SUTURE FIBERWR #2 38 T-5 BLUE (SUTURE) IMPLANT
SUTURE FIBERWR#2 38 REV NDL BL (SUTURE) ×7 IMPLANT
SYR BULB IRRIGATION 50ML (SYRINGE) ×2 IMPLANT
SYR CONTROL 10ML LL (SYRINGE) IMPLANT
TOWEL OR 17X24 6PK STRL BLUE (TOWEL DISPOSABLE) ×2 IMPLANT
TOWEL OR 17X26 10 PK STRL BLUE (TOWEL DISPOSABLE) ×2 IMPLANT
TOWER CARTRIDGE SMART MIX (DISPOSABLE) ×2 IMPLANT
TRAY HUM STD 44MM (Orthopedic Implant) ×2 IMPLANT
WATER STERILE IRR 1000ML POUR (IV SOLUTION) IMPLANT
humeral tray reverse shoulder ×2 IMPLANT

## 2012-05-27 NOTE — Anesthesia Preprocedure Evaluation (Addendum)
Anesthesia Evaluation  Patient identified by MRN, date of birth, ID band Patient awake    Reviewed: Allergy & Precautions, H&P , NPO status , Patient's Chart, lab work & pertinent test results  Airway Mallampati: II      Dental  (+) Teeth Intact and Dental Advisory Given   Pulmonary  breath sounds clear to auscultation        Cardiovascular Rhythm:Regular Rate:Tachycardia     Neuro/Psych    GI/Hepatic   Endo/Other    Renal/GU      Musculoskeletal   Abdominal (+) + obese,  Abdomen: soft.    Peds  Hematology   Anesthesia Other Findings   Reproductive/Obstetrics                           Anesthesia Physical Anesthesia Plan  ASA: III  Anesthesia Plan: General   Post-op Pain Management:    Induction: Intravenous  Airway Management Planned: Oral ETT  Additional Equipment:   Intra-op Plan:   Post-operative Plan: Extubation in OR  Informed Consent: I have reviewed the patients History and Physical, chart, labs and discussed the procedure including the risks, benefits and alternatives for the proposed anesthesia with the patient or authorized representative who has indicated his/her understanding and acceptance.   Dental advisory given  Plan Discussed with: CRNA and Surgeon  Anesthesia Plan Comments: (R. Comminuted Proximal Humerus Fracture Type 2 DM glucose 148 Htn H/O SVT per records NL LV function by Echo 05/27/12  Plan GA with interscalene block.  Kipp Brood, MD)       Anesthesia Quick Evaluation

## 2012-05-27 NOTE — Transfer of Care (Signed)
Immediate Anesthesia Transfer of Care Note  Patient: Michaela Rhodes  Procedure(s) Performed: Procedure(s) (LRB): TOTAL SHOULDER ARTHROPLASTY (Right)  Patient Location: PACU  Anesthesia Type: General  Level of Consciousness: awake, alert , oriented and patient cooperative  Airway & Oxygen Therapy: Patient Spontanous Breathing and Patient connected to face mask oxygen  Post-op Assessment: Report given to PACU RN, Post -op Vital signs reviewed and stable and Patient moving all extremities  Post vital signs: Reviewed and stable  Complications: No apparent anesthesia complications

## 2012-05-27 NOTE — Progress Notes (Signed)
Admitted pt to rm [id number] from [facility], pt alert and oriented, placed on bed comfortably, oriented to room, call bell placed within reach, orders carried out. Will monitor.

## 2012-05-27 NOTE — Progress Notes (Signed)
Subjective:  Patient reports pain as moderate, but occasionally severe.  Objective:   VITALS:   Filed Vitals:   05/27/12 0030 05/27/12 0045 05/27/12 0219 05/27/12 0514  BP: 123/68 121/70 124/82 97/45  Pulse: 112 106 122 81  Temp:   98 F (36.7 C) 97.5 F (36.4 C)  TempSrc:   Oral Oral  Resp:   18 18  Height:   5\' 7"  (1.702 m)   Weight:   93.2 kg (205 lb 7.5 oz)   SpO2: 89% 96% 97% 99%    Neurologically intact  LABS  Results for orders placed during the hospital encounter of 05/26/12 (from the past 24 hour(s))  CBC     Status: Abnormal   Collection Time   05/26/12  8:23 PM      Component Value Range   WBC 23.4 (*) 4.0 - 10.5 (K/uL)   RBC 4.49  3.87 - 5.11 (MIL/uL)   Hemoglobin 12.6  12.0 - 15.0 (g/dL)   HCT 16.1  09.6 - 04.5 (%)   MCV 84.0  78.0 - 100.0 (fL)   MCH 28.1  26.0 - 34.0 (pg)   MCHC 33.4  30.0 - 36.0 (g/dL)   RDW 40.9  81.1 - 91.4 (%)   Platelets 315  150 - 400 (K/uL)  TYPE AND SCREEN     Status: Normal   Collection Time   05/26/12  8:25 PM      Component Value Range   ABO/RH(D) B POS     Antibody Screen NEG     Sample Expiration 05/29/2012    ABO/RH     Status: Normal   Collection Time   05/26/12  8:25 PM      Component Value Range   ABO/RH(D) B POS    POCT I-STAT, CHEM 8     Status: Abnormal   Collection Time   05/26/12  8:36 PM      Component Value Range   Sodium 140  135 - 145 (mEq/L)   Potassium 3.9  3.5 - 5.1 (mEq/L)   Chloride 105  96 - 112 (mEq/L)   BUN 43 (*) 6 - 23 (mg/dL)   Creatinine, Ser 7.82 (*) 0.50 - 1.10 (mg/dL)   Glucose, Bld 956 (*) 70 - 99 (mg/dL)   Calcium, Ion 2.13  0.86 - 1.32 (mmol/L)   TCO2 22  0 - 100 (mmol/L)   Hemoglobin 13.3  12.0 - 15.0 (g/dL)   HCT 57.8  46.9 - 62.9 (%)  URINALYSIS, ROUTINE W REFLEX MICROSCOPIC     Status: Abnormal   Collection Time   05/26/12 11:49 PM      Component Value Range   Color, Urine YELLOW  YELLOW    APPearance CLOUDY (*) CLEAR    Specific Gravity, Urine 1.033 (*) 1.005 - 1.030      pH 5.0  5.0 - 8.0    Glucose, UA 250 (*) NEGATIVE (mg/dL)   Hgb urine dipstick SMALL (*) NEGATIVE    Bilirubin Urine NEGATIVE  NEGATIVE    Ketones, ur 15 (*) NEGATIVE (mg/dL)   Protein, ur NEGATIVE  NEGATIVE (mg/dL)   Urobilinogen, UA 1.0  0.0 - 1.0 (mg/dL)   Nitrite NEGATIVE  NEGATIVE    Leukocytes, UA SMALL (*) NEGATIVE   URINE MICROSCOPIC-ADD ON     Status: Abnormal   Collection Time   05/26/12 11:49 PM      Component Value Range   Squamous Epithelial / LPF RARE  RARE    WBC,  UA 7-10  <3 (WBC/hpf)   RBC / HPF 0-2  <3 (RBC/hpf)   Bacteria, UA MANY (*) RARE    Urine-Other MUCOUS PRESENT    SURGICAL PCR SCREEN     Status: Normal   Collection Time   05/27/12  3:41 AM      Component Value Range   MRSA, PCR NEGATIVE  NEGATIVE    Staphylococcus aureus NEGATIVE  NEGATIVE     Dg Ribs Unilateral W/chest Right  05/26/2012  *RADIOLOGY REPORT*  Clinical Data: Fall, chest pain  RIGHT RIBS AND CHEST - 3+ VIEW  Comparison: Chest radiographs dated 05/26/2012  Findings: No right rib fracture is seen.  Displaced right proximal humerus fracture.  Visualized right lung is clear.  IMPRESSION: No right rib fracture is seen.  Displaced right proximal humerus fracture.  Original Report Authenticated By: Charline Bills, M.D.   Dg Shoulder Right  05/26/2012  *RADIOLOGY REPORT*  Clinical Data: Fall down stairs, severe right shoulder fracture  RIGHT SHOULDER - 2+ VIEW  Comparison: None.  Findings: Evaluation is constrained due to difficulty with patient positioning.  Suspected three-part proximal humeral fracture.  Additional mild comminution.  Dominant component is a displaced femoral neck fracture.  Distal fracture fragments are inferiorly/medially displaced.  IMPRESSION: Evaluation is constrained due to difficulty with patient positioning.  Suspected three-part proximal humeral fracture.  Dominant component is a displaced femoral neck fracture, as above.  Original Report Authenticated By: Charline Bills,  M.D.   Dg Wrist Complete Left  05/26/2012  *RADIOLOGY REPORT*  Clinical Data: Fall down stairs, wrist pain  LEFT WRIST - COMPLETE 3+ VIEW  Comparison: None.  Findings: Mild deformity of the distal radius, favored to be chronic.  Otherwise, no evidence of fracture or dislocation.  Degenerative changes.  The visualized soft tissues are unremarkable.  IMPRESSION: Mild deformity of the distal radius, favored to be chronic. Correlate with the site of the patient's pain.  Otherwise, no evidence of fracture or dislocation.  Original Report Authenticated By: Charline Bills, M.D.   Dg Tibia/fibula Right  05/26/2012  *RADIOLOGY REPORT*  Clinical Data: Fall, anterior midshaft tibial pain  RIGHT TIBIA AND FIBULA - 2 VIEW  Comparison: None.  Findings: No fracture or dislocation is seen.  The joint spaces are preserved.  Mild soft tissue swelling overlying the anterior proximal tibia.  IMPRESSION: No fracture or dislocation is seen.  Mild soft tissue swelling.  Original Report Authenticated By: Charline Bills, M.D.   Ct Head Wo Contrast  05/26/2012  *RADIOLOGY REPORT*  Clinical Data:  Fall, head/neck pain  CT HEAD WITHOUT CONTRAST CT CERVICAL SPINE WITHOUT CONTRAST  Technique:  Multidetector CT imaging of the head and cervical spine was performed following the standard protocol without intravenous contrast.  Multiplanar CT image reconstructions of the cervical spine were also generated.  Comparison:  None.  CT HEAD  Findings: No evidence of parenchymal hemorrhage or extra-axial fluid collection. No mass lesion, mass effect, or midline shift.  No CT evidence of acute infarction.  Mild intracranial atherosclerosis.  Cerebral volume is age appropriate.  No ventriculomegaly.  The visualized paranasal sinuses are essentially clear. The mastoid air cells are unopacified.  Large extracranial hematoma overlying the right frontotemporal region (series 3/image 21).  No evidence of calvarial fracture.  IMPRESSION: Large  extracranial hematoma overlying the right frontotemporal region.  No evidence of calvarial fracture.  No evidence of acute intracranial abnormality.  CT CERVICAL SPINE  Findings: Normal cervical lordosis.  No evidence of fracture or dislocation.  Vertebral body heights are maintained.  The dens appears intact.  No prevertebral soft tissue swelling.  Mild multilevel degenerative changes.  Visualized thyroid is unremarkable.  Visualized lung apices are clear.  IMPRESSION: No evidence of traumatic injury to the cervical spine.  Mild multilevel degenerative changes.  Original Report Authenticated By: Charline Bills, M.D.   Ct Cervical Spine Wo Contrast  05/26/2012  *RADIOLOGY REPORT*  Clinical Data:  Fall, head/neck pain  CT HEAD WITHOUT CONTRAST CT CERVICAL SPINE WITHOUT CONTRAST  Technique:  Multidetector CT imaging of the head and cervical spine was performed following the standard protocol without intravenous contrast.  Multiplanar CT image reconstructions of the cervical spine were also generated.  Comparison:  None.  CT HEAD  Findings: No evidence of parenchymal hemorrhage or extra-axial fluid collection. No mass lesion, mass effect, or midline shift.  No CT evidence of acute infarction.  Mild intracranial atherosclerosis.  Cerebral volume is age appropriate.  No ventriculomegaly.  The visualized paranasal sinuses are essentially clear. The mastoid air cells are unopacified.  Large extracranial hematoma overlying the right frontotemporal region (series 3/image 21).  No evidence of calvarial fracture.  IMPRESSION: Large extracranial hematoma overlying the right frontotemporal region.  No evidence of calvarial fracture.  No evidence of acute intracranial abnormality.  CT CERVICAL SPINE  Findings: Normal cervical lordosis.  No evidence of fracture or dislocation.  Vertebral body heights are maintained.  The dens appears intact.  No prevertebral soft tissue swelling.  Mild multilevel degenerative changes.   Visualized thyroid is unremarkable.  Visualized lung apices are clear.  IMPRESSION: No evidence of traumatic injury to the cervical spine.  Mild multilevel degenerative changes.  Original Report Authenticated By: Charline Bills, M.D.   Ct Abdomen Pelvis W Contrast  05/26/2012  *RADIOLOGY REPORT*  Clinical Data: Fall, abdominal pain, prior appendectomy, cholecystectomy, hysterectomy, and mastectomy  CT ABDOMEN AND PELVIS WITH CONTRAST  Technique:  Multidetector CT imaging of the abdomen and pelvis was performed following the standard protocol during bolus administration of intravenous contrast.  Contrast: OMNIPAQUE IOHEXOL 300 MG/ML  SOLN  Comparison: None.  Findings: Mild dependent atelectasis at the lung bases.  Left mastectomy with breast reconstruction.  Soft tissue hematoma along the right lateral chest wall (series 2/image 1).  Small hiatal hernia.  Multiple hepatic cysts.  Spleen, pancreas, and adrenal glands are within normal limits.  Status post cholecystectomy.  Mild intrahepatic/extrahepatic ductal dilatation, likely postsurgical.  Common duct measures 11 mm.  Kidneys are notable for bilateral renal sinus cysts.  No hydronephrosis.  No evidence of bowel obstruction.  Prior appendectomy.  Colonic diverticulosis, without associated inflammatory changes.  No evidence of abdominal aortic aneurysm.  No abdominopelvic ascites.  No hemoperitoneum.  No free air.  No suspicious abdominopelvic lymphadenopathy.  Status post hysterectomy.  Bladder is within normal limits.  Degenerative changes of the visualized thoracolumbar spine.  Mild to moderate superior endplate compression deformity at L1, chronic.  IMPRESSION: No evidence of traumatic injury to the abdomen/pelvis.  Soft tissue hematoma along the right lateral chest wall, likely extending inferiorly from the right shoulder.  Additional ancillary findings as above.  Original Report Authenticated By: Charline Bills, M.D.   Dg Pelvis  Portable  05/26/2012  *RADIOLOGY REPORT*  Clinical Data: Fall, pelvic pain  PORTABLE PELVIS  Comparison: None.  Findings: There is no evidence of fracture or dislocation.  There is no evidence of arthropathy or other focal bony abnormality. Soft tissues are unremarkable.  IMPRESSION: Negative exam.  Original Report Authenticated  By: Elsie Stain, M.D.   Dg Chest Port 1 View  05/26/2012  *RADIOLOGY REPORT*  Clinical Data: Fall, pain  PORTABLE CHEST - 1 VIEW  Comparison: None.  Findings: Cardiomegaly.  Left mastectomy and axillary node dissection.  Clear lung fields. No effusion or pneumothorax. Right shoulder fracture, incompletely evaluated. Apparent overriding fracture fragments. Consider upper extremity radiographs for further evaluation.  IMPRESSION: Cardiomegaly without active infiltrates, rib fracture, or pneumothorax.  Right shoulder fracture, incompletely evaluated.  Original Report Authenticated By: Elsie Stain, M.D.   Dg Humerus Right  05/26/2012  *RADIOLOGY REPORT*  Clinical Data: Fall down stairs, severe right shoulder fracture  RIGHT HUMERUS - 2+ VIEW  Comparison: None.  Findings: Suspected three-part proximal humerus fracture.  See report from right shoulder radiographs for further description.  IMPRESSION: Suspected three-part proximal humerus fracture.  Original Report Authenticated By: Charline Bills, M.D.    Assessment/Plan: Day of Surgery   Principal Problem:  *Closed fracture of right proximal humerus Active Problems:  Tachycardia  Hyperglycemia   Right shoulder ORIF vs. Hemi vs. Reverse TSA.  RBA discussed.     Ketrina Boateng P 05/27/2012, 8:36 AM   Teryl Lucy, MD [phone] pager

## 2012-05-27 NOTE — ED Notes (Signed)
Report called to [id number], [facility] Notified

## 2012-05-27 NOTE — Anesthesia Postprocedure Evaluation (Signed)
  Anesthesia Post-op Note  Patient: Michaela Rhodes  Procedure(s) Performed: Procedure(s) (LRB): TOTAL SHOULDER ARTHROPLASTY (Right)  Patient Location: PACU  Anesthesia Type: General  Level of Consciousness: awake, alert  and oriented  Airway and Oxygen Therapy: Patient Spontanous Breathing and Patient connected to nasal cannula oxygen  Post-op Pain: none  Post-op Assessment: Post-op Vital signs reviewed and Patient's Cardiovascular Status Stable  Post-op Vital Signs: stable  Complications: No apparent anesthesia complications

## 2012-05-27 NOTE — Progress Notes (Signed)
Reviewed chart, labs, imaging and consult note by Dr.Kim.  Will continue to follow pt through post op course.  Will D/C glimepiride as it carries high risk for hypoglycemia in an elderly patient.  Will replace potassium and monitor lytes, check Mg level.   See orders.  Maryln Manuel, MD

## 2012-05-27 NOTE — Anesthesia Procedure Notes (Addendum)
Anesthesia Regional Block:  Interscalene brachial plexus block  Pre-Anesthetic Checklist: ,, timeout performed, Correct Patient, Correct Site, Correct Laterality, Correct Procedure, Correct Position, site marked, Risks and benefits discussed,  Surgical consent,  Pre-op evaluation,  At surgeon's request and post-op pain management  Laterality: Right  Prep: chloraprep       Needles:  Injection technique: Single-shot  Needle Type: Echogenic Stimulator Needle      Needle Gauge: 22 and 22 G    Additional Needles:  Procedures: ultrasound guided and nerve stimulator Interscalene brachial plexus block Narrative:  Start time: 05/27/2012 8:45 AM End time: 05/27/2012 8:55 AM  Performed by: Personally   Additional Notes: 20 cc 0.5% Naropin injected without difficulty  Kipp Brood, MD  Interscalene brachial plexus block Procedure Name: Intubation Date/Time: 05/27/2012 9:44 AM Performed by: Jerilee Hoh Pre-anesthesia Checklist: Patient identified, Emergency Drugs available, Suction available and Patient being monitored Patient Re-evaluated:Patient Re-evaluated prior to inductionOxygen Delivery Method: Circle system utilized Preoxygenation: Pre-oxygenation with 100% oxygen Intubation Type: IV induction Ventilation: Mask ventilation without difficulty Laryngoscope Size: Miller and 3 Grade View: Grade III Tube type: Oral Tube size: 7.5 mm Number of attempts: 3 Airway Equipment and Method: Bougie stylet Placement Confirmation: ETT inserted through vocal cords under direct vision,  positive ETCO2 and breath sounds checked- equal and bilateral Secured at: 22 cm Tube secured with: Tape Dental Injury: Teeth and Oropharynx as per pre-operative assessment  Difficulty Due To: Difficulty was unanticipated and Difficult Airway- due to anterior larynx Comments: Easy mask airway.  DL with MAC 3 blade, Grade III view, attempted to intubate.  Esophageal intubation immediately recognized. Resumed  mask ventilation.  DL x 2, attempted to pass bougie, but unable to.  DL x 3 by Dr. Noreene Larsson with Hyacinth Meeker 3 blade.  Bougie passed atraumatically, ETT easily passed over bougie.  Atraumatic intubation after 3rd attempt.

## 2012-05-27 NOTE — Op Note (Signed)
05/26/2012 - 05/27/2012  1:15 PM  PATIENT:  Michaela Rhodes    PRE-OPERATIVE DIAGNOSIS:  Four-part proximal humerus fracture  POST-OPERATIVE DIAGNOSIS:  Same  PROCEDURE:  TOTAL SHOULDER ARTHROPLASTY, reverse  SURGEON:  [name], MD  First assistant: Jones Broom, M.D., present and scrubbed throughout the case, critical for assistance with exposure, decision-making, instrumentation, and implantation.   Second  ASSISTANT: Janace Litten, OPA-C,  ANESTHESIA:   General  PREOPERATIVE INDICATIONS:  Michaela Rhodes is a  76 y.o. female with a diagnosis of fracture of proximal end of right humerus who failed conservative measures and elected for surgical management.    The risks benefits and alternatives were discussed with the patient preoperatively including but not limited to the risks of infection, bleeding, nerve injury, cardiopulmonary complications, the need for revision surgery, among others, and the patient was willing to proceed. We'll see discussed the risks of dislocation, brachial plexus palsy, loosening, the need for revision surgery, among others.  OPERATIVE IMPLANTS: Biomet size 12 cemented humeral fracture stem with a size +5 x 44 mm humeral tray with a size 44 x 36 mm diameter of curvature standard humeral bearing tray with a total of one bag of the every bone cement and a standard size base plate with a central nonlocking screw size 30 mm, and a total of 4 peripheral locking screws with a standard glenoid sphere set at DD position with slight inferior offset, using a 36 mm glenoid sphere. I also used a single versa dial standard adapter, and I implanted, but removed because it did not provide the adequate height, a size 44 mm standard reverse tray for the humeral tray.  OPERATIVE FINDINGS: Four-part proximal humerus fracture with a head split component and very poor bone quality  OPERATIVE PROCEDURE: The patient was brought to the operating room and placed in the supine  position. General anesthesia was administered. She was on Levaquin previously, however I ordered 2 g of Ancef with a test dose given her penicillin allergy. She tolerated this well. She was also placed in a beachchair position and had a regional block. The right upper extremity was prepped and draped in usual sterile fashion. Time out was performed. Deltopectoral approach was carried out. The shaft was significantly displaced medially. I identified the tuberosities, and the biceps tendon, and performed a biceps tenodesis, and divided the tuberosities in the bicipital groove. The subscapularis was tagged. A total of 4 #2 FiberWire were sutures were placed through the supraspinatus tuberosity.  I then exposed the shaft and cleaned the canal.  I then exposed the glenoid, and placed a central guidewire with slight inferior inclination after circumferentially releasing the labrum. A standard size base plate was selected. I drilled the central hole, and then reamed over the hole, and removing the cartilage. I took care to preserve the subchondral plate. I then impacted the baseplate in place, and then secured it centrally with a nonlocking screw, and then peripherally with locking screws. Excellent fixation was achieved. I then placed the glenoid sphere into position. I did so with a slight inferior offset.  I then turned my attention to the humerus. I sequentially hand reamed, up to size 13, and selected the size 12. I trialed this, and felt like it had reasonable soft tissue tension. I placed a canal restrictor distally, and then cemented the real prosthesis into place. I trialed with a trial tray, and selected where I thought was the right size, and then after placing the real tray and  implant into place, and was not happy with the soft tissue restoration. It had one finger tension, and was easily dislocatable. Therefore I removed the tray, and replaced with a +5, which improved the soft tissue tension to the  appropriate 2 finger tightness. I did this after trialing extensively with multiple different trials.  Then irrigated was copiously, and I had our to pass sutures through the shaft, as well as around the tuberosity, and around the medial side of the prosthesis. I secured the greater tuberosity to the lesser tuberosity and subscapularis, although given the lateralization and distal location of the shaft, the did not reach the shaft very well. Nonetheless I secured them around the shaft, and placed extensive bone graft in order to try and optimize tuberosity healing. Given the fact that the reverse prosthesis is a nonanatomic reconstruction, I was not able to anatomically get the greater tuberosity back to the shaft, but we did the best we could, and tried to provide adequate soft tissue sleeve and coverage over the stem and tray and the glenosphere.  I then irrigated the wounds copiously, and repaired the deltopectoral interval, as well as the cutaneous tissue with Vicryl, followed by routine closure for the skin with Steri-Strips and sterile gauze. She was placed in a sling. She was awakened and returned back in stable and satisfactory condition. There no complications and she tolerated the procedure well.

## 2012-05-27 NOTE — Progress Notes (Signed)
Subjective: Interval History: resting quietly, no complaints.  Objective: Vital signs in last 24 hours: Temp:  [98 F (36.7 C)] 98 F (36.7 C) (06/09 0219) Pulse Rate:  [106-122] 122  (06/09 0219) Resp:  [10-24] 18  (06/09 0219) BP: (112-137)/(65-82) 124/82 mmHg (06/09 0219) SpO2:  [89 %-98 %] 97 % (06/09 0219) Weight:  [93.2 kg (205 lb 7.5 oz)] 93.2 kg (205 lb 7.5 oz) (06/09 0219)  Intake/Output from previous day:   Intake/Output this shift:    BP 124/82  Pulse 122  Temp(Src) 98 F (36.7 C) (Oral)  Resp 18  Ht 5\' 7"  (1.702 m)  Wt 93.2 kg (205 lb 7.5 oz)  BMI 32.18 kg/m2  SpO2 97% General appearance: alert, cooperative and no distress Lungs: clear to auscultation bilaterally Heart: regular rate and rhythm  Results for orders placed during the hospital encounter of 05/26/12 (from the past 24 hour(s))  CBC     Status: Abnormal   Collection Time   05/26/12  8:23 PM      Component Value Range   WBC 23.4 (*) 4.0 - 10.5 (K/uL)   RBC 4.49  3.87 - 5.11 (MIL/uL)   Hemoglobin 12.6  12.0 - 15.0 (g/dL)   HCT 16.1  09.6 - 04.5 (%)   MCV 84.0  78.0 - 100.0 (fL)   MCH 28.1  26.0 - 34.0 (pg)   MCHC 33.4  30.0 - 36.0 (g/dL)   RDW 40.9  81.1 - 91.4 (%)   Platelets 315  150 - 400 (K/uL)  TYPE AND SCREEN     Status: Normal   Collection Time   05/26/12  8:25 PM      Component Value Range   ABO/RH(D) B POS     Antibody Screen NEG     Sample Expiration 05/29/2012    ABO/RH     Status: Normal   Collection Time   05/26/12  8:25 PM      Component Value Range   ABO/RH(D) B POS    POCT I-STAT, CHEM 8     Status: Abnormal   Collection Time   05/26/12  8:36 PM      Component Value Range   Sodium 140  135 - 145 (mEq/L)   Potassium 3.9  3.5 - 5.1 (mEq/L)   Chloride 105  96 - 112 (mEq/L)   BUN 43 (*) 6 - 23 (mg/dL)   Creatinine, Ser 7.82 (*) 0.50 - 1.10 (mg/dL)   Glucose, Bld 956 (*) 70 - 99 (mg/dL)   Calcium, Ion 2.13  0.86 - 1.32 (mmol/L)   TCO2 22  0 - 100 (mmol/L)   Hemoglobin 13.3   12.0 - 15.0 (g/dL)   HCT 57.8  46.9 - 62.9 (%)  URINALYSIS, ROUTINE W REFLEX MICROSCOPIC     Status: Abnormal   Collection Time   05/26/12 11:49 PM      Component Value Range   Color, Urine YELLOW  YELLOW    APPearance CLOUDY (*) CLEAR    Specific Gravity, Urine 1.033 (*) 1.005 - 1.030    pH 5.0  5.0 - 8.0    Glucose, UA 250 (*) NEGATIVE (mg/dL)   Hgb urine dipstick SMALL (*) NEGATIVE    Bilirubin Urine NEGATIVE  NEGATIVE    Ketones, ur 15 (*) NEGATIVE (mg/dL)   Protein, ur NEGATIVE  NEGATIVE (mg/dL)   Urobilinogen, UA 1.0  0.0 - 1.0 (mg/dL)   Nitrite NEGATIVE  NEGATIVE    Leukocytes, UA SMALL (*) NEGATIVE   URINE MICROSCOPIC-ADD  ON     Status: Abnormal   Collection Time   05/26/12 11:49 PM      Component Value Range   Squamous Epithelial / LPF RARE  RARE    WBC, UA 7-10  <3 (WBC/hpf)   RBC / HPF 0-2  <3 (RBC/hpf)   Bacteria, UA MANY (*) RARE    Urine-Other MUCOUS PRESENT      Studies/Results: Dg Ribs Unilateral W/chest Right  05/26/2012  *RADIOLOGY REPORT*  Clinical Data: Fall, chest pain  RIGHT RIBS AND CHEST - 3+ VIEW  Comparison: Chest radiographs dated 05/26/2012  Findings: No right rib fracture is seen.  Displaced right proximal humerus fracture.  Visualized right lung is clear.  IMPRESSION: No right rib fracture is seen.  Displaced right proximal humerus fracture.  Original Report Authenticated By: Charline Bills, M.D.   Dg Shoulder Right  05/26/2012  *RADIOLOGY REPORT*  Clinical Data: Fall down stairs, severe right shoulder fracture  RIGHT SHOULDER - 2+ VIEW  Comparison: None.  Findings: Evaluation is constrained due to difficulty with patient positioning.  Suspected three-part proximal humeral fracture.  Additional mild comminution.  Dominant component is a displaced femoral neck fracture.  Distal fracture fragments are inferiorly/medially displaced.  IMPRESSION: Evaluation is constrained due to difficulty with patient positioning.  Suspected three-part proximal humeral  fracture.  Dominant component is a displaced femoral neck fracture, as above.  Original Report Authenticated By: Charline Bills, M.D.   Dg Wrist Complete Left  05/26/2012  *RADIOLOGY REPORT*  Clinical Data: Fall down stairs, wrist pain  LEFT WRIST - COMPLETE 3+ VIEW  Comparison: None.  Findings: Mild deformity of the distal radius, favored to be chronic.  Otherwise, no evidence of fracture or dislocation.  Degenerative changes.  The visualized soft tissues are unremarkable.  IMPRESSION: Mild deformity of the distal radius, favored to be chronic. Correlate with the site of the patient's pain.  Otherwise, no evidence of fracture or dislocation.  Original Report Authenticated By: Charline Bills, M.D.   Dg Tibia/fibula Right  05/26/2012  *RADIOLOGY REPORT*  Clinical Data: Fall, anterior midshaft tibial pain  RIGHT TIBIA AND FIBULA - 2 VIEW  Comparison: None.  Findings: No fracture or dislocation is seen.  The joint spaces are preserved.  Mild soft tissue swelling overlying the anterior proximal tibia.  IMPRESSION: No fracture or dislocation is seen.  Mild soft tissue swelling.  Original Report Authenticated By: Charline Bills, M.D.   Ct Head Wo Contrast  05/26/2012  *RADIOLOGY REPORT*  Clinical Data:  Fall, head/neck pain  CT HEAD WITHOUT CONTRAST CT CERVICAL SPINE WITHOUT CONTRAST  Technique:  Multidetector CT imaging of the head and cervical spine was performed following the standard protocol without intravenous contrast.  Multiplanar CT image reconstructions of the cervical spine were also generated.  Comparison:  None.  CT HEAD  Findings: No evidence of parenchymal hemorrhage or extra-axial fluid collection. No mass lesion, mass effect, or midline shift.  No CT evidence of acute infarction.  Mild intracranial atherosclerosis.  Cerebral volume is age appropriate.  No ventriculomegaly.  The visualized paranasal sinuses are essentially clear. The mastoid air cells are unopacified.  Large extracranial  hematoma overlying the right frontotemporal region (series 3/image 21).  No evidence of calvarial fracture.  IMPRESSION: Large extracranial hematoma overlying the right frontotemporal region.  No evidence of calvarial fracture.  No evidence of acute intracranial abnormality.  CT CERVICAL SPINE  Findings: Normal cervical lordosis.  No evidence of fracture or dislocation.  Vertebral body heights are maintained.  The dens appears intact.  No prevertebral soft tissue swelling.  Mild multilevel degenerative changes.  Visualized thyroid is unremarkable.  Visualized lung apices are clear.  IMPRESSION: No evidence of traumatic injury to the cervical spine.  Mild multilevel degenerative changes.  Original Report Authenticated By: Charline Bills, M.D.   Ct Cervical Spine Wo Contrast  05/26/2012  *RADIOLOGY REPORT*  Clinical Data:  Fall, head/neck pain  CT HEAD WITHOUT CONTRAST CT CERVICAL SPINE WITHOUT CONTRAST  Technique:  Multidetector CT imaging of the head and cervical spine was performed following the standard protocol without intravenous contrast.  Multiplanar CT image reconstructions of the cervical spine were also generated.  Comparison:  None.  CT HEAD  Findings: No evidence of parenchymal hemorrhage or extra-axial fluid collection. No mass lesion, mass effect, or midline shift.  No CT evidence of acute infarction.  Mild intracranial atherosclerosis.  Cerebral volume is age appropriate.  No ventriculomegaly.  The visualized paranasal sinuses are essentially clear. The mastoid air cells are unopacified.  Large extracranial hematoma overlying the right frontotemporal region (series 3/image 21).  No evidence of calvarial fracture.  IMPRESSION: Large extracranial hematoma overlying the right frontotemporal region.  No evidence of calvarial fracture.  No evidence of acute intracranial abnormality.  CT CERVICAL SPINE  Findings: Normal cervical lordosis.  No evidence of fracture or dislocation.  Vertebral body heights  are maintained.  The dens appears intact.  No prevertebral soft tissue swelling.  Mild multilevel degenerative changes.  Visualized thyroid is unremarkable.  Visualized lung apices are clear.  IMPRESSION: No evidence of traumatic injury to the cervical spine.  Mild multilevel degenerative changes.  Original Report Authenticated By: Charline Bills, M.D.   Ct Abdomen Pelvis W Contrast  05/26/2012  *RADIOLOGY REPORT*  Clinical Data: Fall, abdominal pain, prior appendectomy, cholecystectomy, hysterectomy, and mastectomy  CT ABDOMEN AND PELVIS WITH CONTRAST  Technique:  Multidetector CT imaging of the abdomen and pelvis was performed following the standard protocol during bolus administration of intravenous contrast.  Contrast: OMNIPAQUE IOHEXOL 300 MG/ML  SOLN  Comparison: None.  Findings: Mild dependent atelectasis at the lung bases.  Left mastectomy with breast reconstruction.  Soft tissue hematoma along the right lateral chest wall (series 2/image 1).  Small hiatal hernia.  Multiple hepatic cysts.  Spleen, pancreas, and adrenal glands are within normal limits.  Status post cholecystectomy.  Mild intrahepatic/extrahepatic ductal dilatation, likely postsurgical.  Common duct measures 11 mm.  Kidneys are notable for bilateral renal sinus cysts.  No hydronephrosis.  No evidence of bowel obstruction.  Prior appendectomy.  Colonic diverticulosis, without associated inflammatory changes.  No evidence of abdominal aortic aneurysm.  No abdominopelvic ascites.  No hemoperitoneum.  No free air.  No suspicious abdominopelvic lymphadenopathy.  Status post hysterectomy.  Bladder is within normal limits.  Degenerative changes of the visualized thoracolumbar spine.  Mild to moderate superior endplate compression deformity at L1, chronic.  IMPRESSION: No evidence of traumatic injury to the abdomen/pelvis.  Soft tissue hematoma along the right lateral chest wall, likely extending inferiorly from the right shoulder.   Additional ancillary findings as above.  Original Report Authenticated By: Charline Bills, M.D.   Dg Pelvis Portable  05/26/2012  *RADIOLOGY REPORT*  Clinical Data: Fall, pelvic pain  PORTABLE PELVIS  Comparison: None.  Findings: There is no evidence of fracture or dislocation.  There is no evidence of arthropathy or other focal bony abnormality. Soft tissues are unremarkable.  IMPRESSION: Negative exam.  Original Report Authenticated By: Elsie Stain, M.D.  Dg Chest Port 1 View  05/26/2012  *RADIOLOGY REPORT*  Clinical Data: Fall, pain  PORTABLE CHEST - 1 VIEW  Comparison: None.  Findings: Cardiomegaly.  Left mastectomy and axillary node dissection.  Clear lung fields. No effusion or pneumothorax. Right shoulder fracture, incompletely evaluated. Apparent overriding fracture fragments. Consider upper extremity radiographs for further evaluation.  IMPRESSION: Cardiomegaly without active infiltrates, rib fracture, or pneumothorax.  Right shoulder fracture, incompletely evaluated.  Original Report Authenticated By: Elsie Stain, M.D.   Dg Humerus Right  05/26/2012  *RADIOLOGY REPORT*  Clinical Data: Fall down stairs, severe right shoulder fracture  RIGHT HUMERUS - 2+ VIEW  Comparison: None.  Findings: Suspected three-part proximal humerus fracture.  See report from right shoulder radiographs for further description.  IMPRESSION: Suspected three-part proximal humerus fracture.  Original Report Authenticated By: Charline Bills, M.D.    Scheduled Meds:   . amLODipine  5 mg Oral Daily  . docusate sodium  100 mg Oral BID  . glimepiride  1 mg Oral Q breakfast  . lisinopril  20 mg Oral Daily   And  . hydrochlorothiazide  25 mg Oral Daily  . insulin aspart  0-15 Units Subcutaneous TID WC  . levofloxacin (LEVAQUIN) IV  500 mg Intravenous Q24H  . levothyroxine  88 mcg Oral QAC breakfast  . metoprolol tartrate  50 mg Oral BID  .  morphine injection  2 mg Intravenous Once  .  morphine injection  4 mg  Intravenous Once  .  morphine injection  4 mg Intravenous Once  .  morphine injection  6 mg Intravenous Once  . senna  1 tablet Oral BID  . simvastatin  20 mg Oral q1800  . DISCONTD: insulin aspart  0-9 Units Subcutaneous TID WC  . DISCONTD: levothyroxine  88 mcg Oral QAC breakfast  . DISCONTD: lisinopril-hydrochlorothiazide  1 tablet Oral Daily  . DISCONTD: metoprolol  50 mg Oral BID  . DISCONTD: simvastatin  20 mg Oral q1800   Continuous Infusions:   . 0.45 % NaCl with KCl 20 mEq / L 75 mL/hr at 05/27/12 0325  . DISCONTD: sodium chloride 125 mL/hr at 05/26/12 1855  . DISCONTD: sodium chloride     PRN Meds:acetaminophen, acetaminophen, alum & mag hydroxide-simeth, diphenhydrAMINE, HYDROcodone-acetaminophen, HYDROmorphone (DILAUDID) injection, iohexol, menthol-cetylpyridinium, methocarbamol (ROBAXIN) IV, methocarbamol, metoCLOPramide (REGLAN) injection, metoCLOPramide, morphine injection, ondansetron (ZOFRAN) IV, ondansetron, phenol, zolpidem  Assessment/Plan: Right proximal humerus fracture: transferred from [facility] for surgery this am. Vital signs are stable, tachycardic in 120's. Pain controlled at this time.   LOS: 1 day   Commodore Bellew A.

## 2012-05-27 NOTE — Discharge Instructions (Signed)
Humerus Fracture, Treated with Open Reduction You have a fracture (break in bone) of your humerus. This is the large bone in your upper arm. These fractures are easily diagnosed with x-rays. TREATMENT  Simple fractures that will heal without disability are treated with simple immobilization. This is often followed with early range of motion exercises to keep the shoulder from becoming frozen (stuck in one position). Your caregiver feels you have an unstable fracture. Unstable fractures are treated with an open reduction (operation) and internal fixation. A screw is put into the fracture to hold the bone pieces in position while they heal. This is done to obtain the best possible result for shoulder function. These fixation devices are often left in place after healing. They generally do not cause problems and you usually will not know they are there. RELATED COMPLICATIONS The most common complication of upper arm fractures is adhesive capsulitis. This means the shoulder is frozen or difficult to move. Other complications include infection, non-union or malunion of the bones. This means the bones do not heal in the correct position or direction. BEFORE AND AFTER YOUR SURGERY Prior to surgery, an IV (intravenous line connected to your vein for giving fluids) may be started. You will be given an anesthetic (medications and gas to make you sleep). After surgery, you will be taken to the recovery area where a nurse will watch your progress. You may have a catheter (a long, narrow, hollow tube) in your bladder that helps you pass your water. Once you're awake, stable, and taking fluids well, you'll be returned to your room. You will receive physical therapy and other care until you are doing well and your caregiver feels it is safe for you to be transferred either to home or to an extended care facility. HOME CARE INSTRUCTIONS  You may resume normal diet and activities as directed or allowed.   Change  dressings if necessary or as instructed.   Only take over-the-counter or prescription medicines for pain, discomfort, or fever as directed by your caregiver.  SEEK IMMEDIATE MEDICAL CARE IF:   There is redness, swelling, or increasing pain in the wound.   There is pus coming from wound.   An unexplained oral temperature above 102 F (38.9 C) develops, or as your caregiver suggests.   A bad smell is coming from the wound or dressing.   The edges of the wound are not staying together after sutures or staples have been removed.  MAKE SURE YOU:   Understand these instructions.   Will watch your condition.   Will get help right away if you are not doing well or get worse.  Document Released: 05/31/2001 Document Revised: 11/24/2011 Document Reviewed: 07/25/2008 [employer] Patient Information 2012 [employer]. 

## 2012-05-28 ENCOUNTER — Encounter (HOSPITAL_COMMUNITY): Payer: Self-pay | Admitting: Orthopedic Surgery

## 2012-05-28 DIAGNOSIS — R000 Tachycardia, unspecified: Secondary | ICD-10-CM

## 2012-05-28 DIAGNOSIS — R55 Syncope and collapse: Secondary | ICD-10-CM

## 2012-05-28 DIAGNOSIS — R7989 Other specified abnormal findings of blood chemistry: Secondary | ICD-10-CM

## 2012-05-28 DIAGNOSIS — E782 Mixed hyperlipidemia: Secondary | ICD-10-CM

## 2012-05-28 LAB — CBC
HCT: 24.5 % — ABNORMAL LOW (ref 36.0–46.0)
MCV: 85.7 fL (ref 78.0–100.0)
RBC: 2.86 MIL/uL — ABNORMAL LOW (ref 3.87–5.11)
WBC: 9.3 10*3/uL (ref 4.0–10.5)

## 2012-05-28 LAB — COMPREHENSIVE METABOLIC PANEL
BUN: 41 mg/dL — ABNORMAL HIGH (ref 6–23)
CO2: 22 mEq/L (ref 19–32)
Chloride: 106 mEq/L (ref 96–112)
Creatinine, Ser: 1.32 mg/dL — ABNORMAL HIGH (ref 0.50–1.10)
GFR calc Af Amer: 45 mL/min — ABNORMAL LOW (ref >90)
GFR calc non Af Amer: 39 mL/min — ABNORMAL LOW (ref >90)
Total Bilirubin: 0.2 mg/dL — ABNORMAL LOW (ref 0.3–1.2)

## 2012-05-28 LAB — HEMOGLOBIN A1C
Hgb A1c MFr Bld: 6 % — ABNORMAL HIGH (ref <5.7)
Mean Plasma Glucose: 126 mg/dL — ABNORMAL HIGH (ref <117)

## 2012-05-28 LAB — GLUCOSE, CAPILLARY

## 2012-05-28 NOTE — Care Management Note (Signed)
    Page 1 of 1   05/28/2012     3:22:08 PM   CARE MANAGEMENT NOTE 05/28/2012  Patient:  Michaela Rhodes, Michaela Rhodes   Account Number:  [id number]  Date Initiated:  05/28/2012  Documentation initiated by:  Fransico Michael  Subjective/Objective Assessment:   admitted 05/26/12 s/p fall with shoulder pain     Action/Plan:   prior to admission, patient lived at home alone and was independent with ADLs   Anticipated DC Date:  05/31/2012   Anticipated DC Plan:  SKILLED NURSING FACILITY      DC Planning Services  CM consult      Choice offered to / List presented to:             Status of service:  In process, will continue to follow Medicare Important Message given?   (If response is "NO", the following Medicare IM given date fields will be blank) Date Medicare IM given:   Date Additional Medicare IM given:    Discharge Disposition:    Per UR Regulation:  Reviewed for med. necessity/level of care/duration of stay  If discussed at Long Length of Stay Meetings, dates discussed:    Comments:  PCP:  ContactSINIYAH, EVANGELIST (son) [phone]  05/28/12-1033-J.Conlee Sliter,RN,BSN  [id number]      76 yo female admitted on 05/26/12 s/p fall with fractured shoulder. Underwent total shoulder arthroplasty on 05/27/12. OT and Pt evaluated patient and recommended short term rehab at [facility]. CSW aware. CM will continue to monitor for discharge and home needs.

## 2012-05-28 NOTE — Progress Notes (Signed)
PT BP manually 92/52 HR 125. Pt has metoprolol, lisinopril, amlodipine, and HCTZ ordered for 1000.  Dr. Laural Benes on floor and notified. Will hold lisinopril, amlodipine, and HCTZ. Order placed to hold lisinopril, amlodipine, and HCTZ if SBP <110.  Metoprolol given per previous order to only hold if SBP <90 or HR<60. Will continue to monitor.

## 2012-05-28 NOTE — Evaluation (Signed)
Physical Therapy Evaluation Patient Details Name: Michaela Rhodes MRN: [id number] DOB: 03-11-1936 Today's Date: 05/28/2012 Time: 4098-1191 PT Time Calculation (min): 20 min  PT Assessment / Plan / Recommendation Clinical Impression  Pt is a 76 y/o female admitted for Right total shoulder secondary to fractures from fall. Pt requires assististance for all forms of mobility and as pt lives alone with no consistent support I would suggest D/C to short term SNF.     PT Assessment  Patient needs continued PT services    Follow Up Recommendations  Skilled nursing facility;Supervision/Assistance - 24 hour    Barriers to Discharge Decreased caregiver support Pt lives alone with no consistenct caregiver.     lEquipment Recommendations  Defer to next venue    Recommendations for Other Services     Frequency Min 3X/week    Precautions / Restrictions Precautions Precautions: Fall;Shoulder Precaution Booklet Issued: No Required Braces or Orthoses: Other Brace/Splint Other Brace/Splint: Right Shoulder immobilizer.  Restrictions Weight Bearing Restrictions: Yes RUE Weight Bearing: Non weight bearing   Pertinent Vitals/Pain Pt denied pain.  O2 sats on room air >90% throughout session.        Mobility  Bed Mobility Bed Mobility: Sit to Sidelying Left Sit to Sidelying Left: 2: Max assist;HOB flat Details for Bed Mobility Assistance: max assist for bilateral LE management.  Verbal and tactile cueing for technique pt attempting to transition from sit to supine vs sidelying.  Transfers Transfers: Sit to Stand;Stand to Sit Sit to Stand: 1: +2 Total assist;2: Max assist;With upper extremity assist;From chair/3-in-1;With armrests Sit to Stand: Patient Percentage: 40% Stand to Sit: 3: Mod assist;4: Min assist;To chair/3-in-1;With upper extremity assist;To bed Details for Transfer Assistance: Cues for technique incluling L hand placement and LE proximity to chair.  Required min-mod assist for  controlled descent to chair.   (Two trials #1 mod assist; #2 Min assist.  ) Ambulation/Gait Ambulation/Gait Assistance: 3: Mod assist Ambulation Distance (Feet): 10 Feet Assistive device: 1 person hand held assist Ambulation/Gait Assistance Details: Cues for trunk posture, assist to stabilize pt for balance in standing.  Cues to increase step length and estend bilatera knees.  Gait Pattern: Step-to pattern;Decreased stride length;Decreased dorsiflexion - left;Right flexed knee in stance;Left flexed knee in stance;Trunk flexed General Gait Details: Pt having difficulty advancing LLE, Pt unable to fully extend knees bilaterally, pt fatigues quickly and pt presents with posterior lean in standing.   Stairs: No Wheelchair Mobility Wheelchair Mobility: No    Exercises Total Joint Exercises Ankle Circles/Pumps: Both;10 reps;Seated Quad Sets: Both;10 reps;Seated   PT Diagnosis: Difficulty walking;Generalized weakness;Abnormality of gait;Acute pain  PT Problem List: Decreased strength;Decreased range of motion;Decreased activity tolerance;Decreased balance;Decreased mobility;Decreased cognition;Decreased knowledge of use of DME;Decreased safety awareness;Decreased knowledge of precautions;Cardiopulmonary status limiting activity;Pain PT Treatment Interventions: Gait training;DME instruction;Stair training;Functional mobility training;Therapeutic activities;Therapeutic exercise;Neuromuscular re-education;Balance training;Patient/family education   PT Goals Acute Rehab PT Goals PT Goal Formulation: With patient Time For Goal Achievement: 06/11/12 Potential to Achieve Goals: Fair Pt will go Supine/Side to Sit: with modified independence PT Goal: Supine/Side to Sit - Progress: Goal set today Pt will Sit at Edge of Bed: with modified independence;3-5 min PT Goal: Sit at Edge Of Bed - Progress: Goal set today Pt will go Sit to Supine/Side: with modified independence;with HOB 0 degrees PT Goal: Sit to  Supine/Side - Progress: Goal set today Pt will go Sit to Stand: with modified independence;with upper extremity assist PT Goal: Sit to Stand - Progress: Goal set today Pt will  go Stand to Sit: with modified independence PT Goal: Stand to Sit - Progress: Goal set today Pt will Ambulate: 51 - 150 feet;with modified independence;with least restrictive assistive device PT Goal: Ambulate - Progress: Goal set today  Visit Information  Last PT Received On: 05/28/12    Subjective Data      Prior Functioning  Home Living Lives With: Alone Available Help at Discharge: Available PRN/intermittently;Family Type of Home: House Home Access: Stairs to enter [employer] of Steps: 6 Entrance Stairs-Rails: Right Home Layout: Laundry or work area in basement;Two level Alternate Level Stairs-Number of Steps: full flight Alternate Level Stairs-Rails: Right Bathroom Shower/Tub: Health visitor: Standard Home Adaptive Equipment: None Prior Function Level of Independence: Independent Able to Take Stairs?: Yes Driving: Yes Vocation: Retired Musician: No difficulties Dominant Hand: Right    Cognition  Overall Cognitive Status: Appears within functional limits for tasks assessed/performed Arousal/Alertness: Awake/alert Orientation Level: Oriented X4 / Intact Behavior During Session: WFL for tasks performed    Extremity/Trunk Assessment Right Upper Extremity Assessment RUE ROM/Strength/Tone: Unable to fully assess Left Upper Extremity Assessment LUE ROM/Strength/Tone: Deficits;Due to pain LUE ROM/Strength/Tone Deficits: Limited use of L UE due to pain in L wrist.   Right Lower Extremity Assessment RLE ROM/Strength/Tone: Within functional levels Left Lower Extremity Assessment LLE ROM/Strength/Tone: Within functional levels Trunk Assessment Trunk Assessment: Normal   Balance Balance Balance Assessed: Yes Static Sitting Balance Static Sitting -  Balance Support: No upper extremity supported;Feet supported Static Sitting - Level of Assistance: 4: Min assist Static Sitting - Comment/# of Minutes: <1 min sitting with back unsupported.  Pt prensents with posterior lean required repeated tactile cues to correct.  Static Standing Balance Static Standing - Balance Support: No upper extremity supported Static Standing - Level of Assistance: 3: Mod assist Static Standing - Comment/# of Minutes: < 1 min with posterior lean required assist to correct.     End of Session PT - End of Session Equipment Utilized During Treatment: Gait belt Activity Tolerance: Patient limited by fatigue Patient left: in bed;with call bell/phone within reach;with nursing in room Nurse Communication: Mobility status   Martha Soltys 05/28/2012, 12:56 PM  Arien Morine L. Lexianna Weinrich DPT [phone]

## 2012-05-28 NOTE — Progress Notes (Signed)
  Echocardiogram 2D Echocardiogram has been performed.  Michaela Rhodes A 05/28/2012, 3:09 PM

## 2012-05-28 NOTE — Progress Notes (Signed)
*  PRELIMINARY RESULTS* Vascular Ultrasound Carotid Duplex (Doppler) has been completed.  Preliminary findings: Bilaterally no significant ICA stenosis with antegrade vertebral flow.  Farrel Demark, RDMS 05/28/2012, 12:10 PM

## 2012-05-28 NOTE — Progress Notes (Signed)
[facility] Progress Note  05/28/2012  Subjective: Pt without complaints.  Pt says that pain is controlled.  No SOB. No CP.    Objective:  Vital signs in last 24 hours: Filed Vitals:   05/27/12 1729 05/27/12 2228 05/28/12 0143 05/28/12 0457  BP: 103/53 109/51 94/50 108/49  Pulse: 87 101 79 82  Temp:  100.2 F (37.9 C) 97.2 F (36.2 C) 98.2 F (36.8 C)  TempSrc:  Oral Oral Oral  Resp:  18 18 18   Height:      Weight:    98.2 kg (216 lb 7.9 oz)  SpO2:  96% 96% 95%   Weight change: 5 kg (11 lb 0.4 oz)  Intake/Output Summary (Last 24 hours) at 05/28/12 0934 Last data filed at 05/28/12 0858  Gross per 24 hour  Intake 4542.5 ml  Output   1135 ml  Net 3407.5 ml   No results found for this basename: HGBA1C   Lab Results  Component Value Date   CREATININE 1.32* 05/28/2012    Review of Systems As above, otherwise all reviewed and reported negative  Physical Exam General - awake, no distress, cooperative HEENT - NCAT, MMM Lungs - BBS, clear CV - normal s1, s2 sounds Abd - soft, nondistended, no masses, nontender Ext - right shoulder sling  Lab Results: Results for orders placed during the hospital encounter of 05/26/12 (from the past 24 hour(s))  GLUCOSE, CAPILLARY     Status: Abnormal   Collection Time   05/27/12  1:46 PM      Component Value Range   Glucose-Capillary 115 (*) 70 - 99 (mg/dL)  GLUCOSE, CAPILLARY     Status: Abnormal   Collection Time   05/27/12  3:55 PM      Component Value Range   Glucose-Capillary 150 (*) 70 - 99 (mg/dL)   Comment 1 Documented in Chart     Comment 2 Notify RN    GLUCOSE, CAPILLARY     Status: Abnormal   Collection Time   05/27/12  9:07 PM      Component Value Range   Glucose-Capillary 139 (*) 70 - 99 (mg/dL)  CBC     Status: Abnormal   Collection Time   05/28/12  5:55 AM      Component Value Range   WBC 9.3  4.0 - 10.5 (K/uL)   RBC 2.86 (*) 3.87 - 5.11 (MIL/uL)   Hemoglobin 8.3 (*) 12.0 - 15.0 (g/dL)   HCT 16.1 (*)  09.6 - 46.0 (%)   MCV 85.7  78.0 - 100.0 (fL)   MCH 29.0  26.0 - 34.0 (pg)   MCHC 33.9  30.0 - 36.0 (g/dL)   RDW 04.5  40.9 - 81.1 (%)   Platelets 200  150 - 400 (K/uL)  COMPREHENSIVE METABOLIC PANEL     Status: Abnormal   Collection Time   05/28/12  5:55 AM      Component Value Range   Sodium 138  135 - 145 (mEq/L)   Potassium 3.9  3.5 - 5.1 (mEq/L)   Chloride 106  96 - 112 (mEq/L)   CO2 22  19 - 32 (mEq/L)   Glucose, Bld 157 (*) 70 - 99 (mg/dL)   BUN 41 (*) 6 - 23 (mg/dL)   Creatinine, Ser 9.14 (*) 0.50 - 1.10 (mg/dL)   Calcium 7.6 (*) 8.4 - 10.5 (mg/dL)   Total Protein 4.7 (*) 6.0 - 8.3 (g/dL)   Albumin 2.3 (*) 3.5 - 5.2 (g/dL)   AST 30  0 - 37 (U/L)   ALT 17  0 - 35 (U/L)   Alkaline Phosphatase 53  39 - 117 (U/L)   Total Bilirubin 0.2 (*) 0.3 - 1.2 (mg/dL)   GFR calc non Af Amer 39 (*) >90 (mL/min)   GFR calc Af Amer 45 (*) >90 (mL/min)  GLUCOSE, CAPILLARY     Status: Abnormal   Collection Time   05/28/12  6:30 AM      Component Value Range   Glucose-Capillary 149 (*) 70 - 99 (mg/dL)   Comment 1 Notify RN      Micro Results: Recent Results (from the past 240 hour(s))  SURGICAL PCR SCREEN     Status: Normal   Collection Time   05/27/12  3:41 AM      Component Value Range Status Comment   MRSA, PCR NEGATIVE  NEGATIVE  Final    Staphylococcus aureus NEGATIVE  NEGATIVE  Final     Medications:  Scheduled Meds:   . amLODipine  5 mg Oral Daily  .  ceFAZolin (ANCEF) IV  2 g Intravenous Q6H  . docusate sodium  100 mg Oral BID  . lisinopril  20 mg Oral Daily   And  . hydrochlorothiazide  25 mg Oral Daily  . insulin aspart  0-15 Units Subcutaneous TID WC  . levofloxacin (LEVAQUIN) IV  500 mg Intravenous Q24H  . levothyroxine  88 mcg Oral QAC breakfast  . metoprolol tartrate  50 mg Oral BID  . senna  1 tablet Oral BID  . simvastatin  20 mg Oral q1800  . DISCONTD: docusate sodium  100 mg Oral BID  . DISCONTD: glimepiride  1 mg Oral Q breakfast  . DISCONTD: senna  1  tablet Oral BID   Continuous Infusions:   . 0.45 % NaCl with KCl 20 mEq / L 75 mL/hr at 05/27/12 2303  . DISCONTD: 0.45 % NaCl with KCl 20 mEq / L 75 mL/hr at 05/27/12 0325   PRN Meds:.acetaminophen, acetaminophen, acetaminophen, alum & mag hydroxide-simeth, diphenhydrAMINE, HYDROmorphone (DILAUDID) injection, menthol-cetylpyridinium, methocarbamol (ROBAXIN) IV, methocarbamol, metoCLOPramide (REGLAN) injection, metoCLOPramide, ondansetron (ZOFRAN) IV, ondansetron (ZOFRAN) IV, ondansetron, oxyCODONE, oxyCODONE-acetaminophen, phenol, zolpidem, DISCONTD: 0.9 % irrigation (POUR BTL), DISCONTD: acetaminophen, DISCONTD: acetaminophen DISCONTD: alum & mag hydroxide-simeth, DISCONTD: diphenhydrAMINE, DISCONTD: HYDROcodone-acetaminophen, DISCONTD:  HYDROmorphone (DILAUDID) injection, DISCONTD:  HYDROmorphone (DILAUDID) injection, DISCONTD: menthol-cetylpyridinium, DISCONTD: methocarbamol (ROBAXIN) IV, DISCONTD: methocarbamol, DISCONTD: metoCLOPramide (REGLAN) injection, DISCONTD: metoCLOPramide, DISCONTD:  morphine injection, DISCONTD: ondansetron (ZOFRAN) IV DISCONTD: ondansetron, DISCONTD: phenol, DISCONTD: zolpidem  Assessment/Plan: POD1 s/p total right shoulder arthroplasty - post op mgmt per orthopedics team - pain well controlled  HTN - pt is having some low BPs post op, will hold amlodipine, hctz and lisinopril for SBP<110.   continue metoprolol - echo pending to evaluate systolic function  Anemia - likely from hydration and post op - will follow  Hypothyroidism - resume home meds  SVT - continue the metoprolol  DM 2 - controlled with supplemental insulin - holding oral meds while in hospital  UTI - continue Levaquin IV - awaiting urine culture  ARF vs CRF - IV fluids (monitoring volume status) - Monitoring   LOS: 2 days   Michaela Rhodes 05/28/2012, 9:34 AM  Cleora Fleet, MD, CDE, FAAFP [facility] [place], [place]  [phone]

## 2012-05-28 NOTE — Plan of Care (Signed)
Problem: Phase I Progression Outcomes Goal: OOB as tolerated unless otherwise ordered Outcome: Progressing OOB to chair     

## 2012-05-28 NOTE — Progress Notes (Signed)
Orthopedic Tech Progress Note Patient Details:  Michaela Rhodes 30-Jan-1936 [id number]  Ortho Devices Type of Ortho Device: Sling immobilizer Ortho Device/Splint Interventions: Application   [name], [name] 05/28/2012, 10:38 AM Trapeze bar

## 2012-05-28 NOTE — Evaluation (Signed)
Occupational Therapy Evaluation Patient Details Name: Michaela Rhodes MRN: [id number] DOB: Dec 22, 1935 Today's Date: 05/28/2012 Time: 1191-4782 OT Time Calculation (min): 29 min  OT Assessment / Plan / Recommendation Clinical Impression  Pt. presents after fall and s/p total right shoulder arthroplasty. Pt. will benefit from skilled OT to increase functional independence to min assist-supervision level at [facility] to next venue of care.    OT Assessment  Patient needs continued OT Services    Follow Up Recommendations  Skilled nursing facility    Barriers to Discharge Decreased caregiver support    Equipment Recommendations  Defer to next venue       Frequency  Min 2X/week    Precautions / Restrictions Precautions Precautions: Fall;Shoulder Precaution Booklet Issued: No Required Braces or Orthoses: Other Brace/Splint Other Brace/Splint: Right Shoulder immobilizer.  Restrictions Weight Bearing Restrictions: Yes RUE Weight Bearing: Non weight bearing       ADL  Eating/Feeding: Simulated;Moderate assistance (due to rt. UE restrictions) Where Assessed - Eating/Feeding: Chair Grooming: Performed;Wash/dry face;Set up (left handed) Where Assessed - Grooming: Supine, head of bed up Upper Body Bathing: Simulated;Maximal assistance Where Assessed - Upper Body Bathing: Supported sitting Lower Body Bathing: Simulated;+1 Total assistance Where Assessed - Lower Body Bathing: Supine, head of bed up;Rolling right and/or left Upper Body Dressing: Performed;Maximal assistance (don gown) Where Assessed - Upper Body Dressing: Supported sitting Lower Body Dressing: Performed;+1 Total assistance (donning bil socks) Where Assessed - Lower Body Dressing: Supine, head of bed up Toilet Transfer: Simulated;Moderate assistance Toilet Transfer Method: Stand pivot Toilet Transfer Equipment: Other (comment) Nurse, children's) Toileting - Clothing Manipulation and Hygiene: Simulated;Maximal assistance Where  Assessed - Toileting Clothing Manipulation and Hygiene: Sit on 3-in-1 or toilet Tub/Shower Transfer Method: Not assessed Transfers/Ambulation Related to ADLs: Pt. provided with max verbal cues for hand placement and technique ADL Comments: Pt. with increased confusion today due to recently receiving pain meds. Pt. stating "I doubt I will remember any of this". Pt. educated on don/doff of sling on rt. UE and hand and wrist exercises to decrease swelling along with ue positioning on pillow and use of ice to decrease pain/swelling.    OT Diagnosis: Generalized weakness;Acute pain  OT Problem List: Decreased strength;Decreased activity tolerance;Impaired balance (sitting and/or standing);Decreased safety awareness;Decreased knowledge of use of DME or AE;Decreased knowledge of precautions;Pain OT Treatment Interventions: Self-care/ADL training;DME and/or AE instruction;Therapeutic activities;Patient/family education;Balance training   OT Goals Acute Rehab OT Goals OT Goal Formulation: With patient Time For Goal Achievement: 06/11/12 Potential to Achieve Goals: Good ADL Goals Pt Will Perform Grooming: with set-up;with supervision;Standing at sink ADL Goal: Grooming - Progress: Goal set today Pt Will Perform Upper Body Bathing: with set-up;with supervision;Sitting, edge of bed ADL Goal: Upper Body Bathing - Progress: Goal set today Pt Will Perform Upper Body Dressing: with set-up;with supervision;Sitting, bed ADL Goal: Upper Body Dressing - Progress: Goal set today Pt Will Transfer to Toilet: with min assist;Stand pivot transfer;3-in-1 ADL Goal: Toilet Transfer - Progress: Goal set today Additional ADL Goal #1: Pt. will don/doff sling independently ADL Goal: Additional Goal #1 - Progress: Goal set today Additional ADL Goal #2: Pt. will complete bed mobility with supervision  ADL Goal: Additional Goal #2 - Progress: Goal set today Arm Goals Pt Will Perform AROM: with supervision, verbal cues  required/provided;to maintain range of motion;Right upper extremity;10 reps (hand/wrist) Arm Goal: AROM - Progress: Goal set today  Visit Information  Last OT Received On: 05/28/12 Assistance Needed: +2    Subjective Data  Subjective: "ha  ha ha ha, I knew you would come for me" Patient Stated Goal: "I guess to move around again"   Prior Functioning  Home Living Lives With: Alone Available Help at Discharge: Available PRN/intermittently;Family Type of Home: House Home Access: Stairs to enter [employer] of Steps: 6 Entrance Stairs-Rails: Right Home Layout: Laundry or work area in basement;Two level Alternate Level Stairs-Number of Steps: full flight Alternate Level Stairs-Rails: Right Bathroom Shower/Tub: Health visitor: Standard Home Adaptive Equipment: None Prior Function Level of Independence: Independent Able to Take Stairs?: Yes Driving: Yes Vocation: Retired Musician: No difficulties Dominant Hand: Right    Cognition  Overall Cognitive Status: Impaired Area of Impairment: Safety/judgement;Other (comment) (due to pain meds) Arousal/Alertness: Awake/alert Orientation Level: Oriented X4 / Intact Behavior During Session:  (intermittent confusion due to pain meds) Safety/Judgement: Decreased awareness of safety precautions;Decreased safety judgement for tasks assessed;Decreased awareness of need for assistance Safety/Judgement - Other Comments: Pt. not aware of rt. UE precautions Cognition - Other Comments: Pt. recently received pain meds and unable to recall certain words when reporting home setup and needing increased time to respond to commands    Extremity/Trunk Assessment Right Upper Extremity Assessment RUE ROM/Strength/Tone: Unable to fully assess (due to fracture) RUE Sensation: WFL - Light Touch RUE Coordination: WFL - fine motor Left Upper Extremity Assessment LUE ROM/Strength/Tone: Deficits;Due to pain LUE  ROM/Strength/Tone Deficits: Limited use of L UE due to pain in L wrist.   (Due to pt. used left hand to scoot herself after fall) LUE Sensation: WFL - Light Touch LUE Coordination: WFL - gross/fine motor Right Lower Extremity Assessment RLE ROM/Strength/Tone: Within functional levels Left Lower Extremity Assessment LLE ROM/Strength/Tone: Within functional levels Trunk Assessment Trunk Assessment: Normal   Mobility Bed Mobility Bed Mobility: Supine to Sit Supine to Sit: 3: Mod assist;HOB elevated (~45 degrees) Sit to Sidelying Left: 2: Max assist;HOB flat Details for Bed Mobility Assistance: Mod verbal cues for hand placement and technique Transfers Transfers: Sit to Stand;Stand to Sit Sit to Stand: 3: Mod assist;From bed;From elevated surface Sit to Stand: Patient Percentage: 40% Stand to Sit: 3: Mod assist;4: Min assist;To chair/3-in-1;With upper extremity assist;To bed Details for Transfer Assistance: Mod verbal cues for left hand placement and technique from elevated surface to chair         End of Session OT - End of Session Equipment Utilized During Treatment: Gait belt;Other (comment) (rt. UE sling) Activity Tolerance: Other (comment) (recent pain meds decreased cognition ) Patient left: in chair;with call bell/phone within reach Nurse Communication: Mobility status;Precautions   Cassandria Anger, OTR/L Pager [phone] 05/28/2012, 2:40 PM

## 2012-05-28 NOTE — Progress Notes (Signed)
Patient ID: Michaela Rhodes, female   DOB: [birth year], 76 y.o.   MRN: [id number]     Subjective:  Patient reports pain as mild.  She complains of a dull pain and denies any CP or SOB.  Objective:   VITALS:   Filed Vitals:   05/27/12 1729 05/27/12 2228 05/28/12 0143 05/28/12 0457  BP: 103/53 109/51 94/50 108/49  Pulse: 87 101 79 82  Temp:  100.2 F (37.9 C) 97.2 F (36.2 C) 98.2 F (36.8 C)  TempSrc:  Oral Oral Oral  Resp:  18 18 18   Height:      Weight:    98.2 kg (216 lb 7.9 oz)  SpO2:  96% 96% 95%    ABD soft Sensation intact distally Dorsiflexion/Plantar flexion intact Incision: dressing C/D/I and no drainage  LABS  Results for orders placed during the hospital encounter of 05/26/12 (from the past 24 hour(s))  GLUCOSE, CAPILLARY     Status: Abnormal   Collection Time   05/27/12  1:46 PM      Component Value Range   Glucose-Capillary 115 (*) 70 - 99 (mg/dL)  GLUCOSE, CAPILLARY     Status: Abnormal   Collection Time   05/27/12  3:55 PM      Component Value Range   Glucose-Capillary 150 (*) 70 - 99 (mg/dL)   Comment 1 Documented in Chart     Comment 2 Notify RN    GLUCOSE, CAPILLARY     Status: Abnormal   Collection Time   05/27/12  9:07 PM      Component Value Range   Glucose-Capillary 139 (*) 70 - 99 (mg/dL)  CBC     Status: Abnormal   Collection Time   05/28/12  5:55 AM      Component Value Range   WBC 9.3  4.0 - 10.5 (K/uL)   RBC 2.86 (*) 3.87 - 5.11 (MIL/uL)   Hemoglobin 8.3 (*) 12.0 - 15.0 (g/dL)   HCT 40.9 (*) 81.1 - 46.0 (%)   MCV 85.7  78.0 - 100.0 (fL)   MCH 29.0  26.0 - 34.0 (pg)   MCHC 33.9  30.0 - 36.0 (g/dL)   RDW 91.4  78.2 - 95.6 (%)   Platelets 200  150 - 400 (K/uL)  COMPREHENSIVE METABOLIC PANEL     Status: Abnormal   Collection Time   05/28/12  5:55 AM      Component Value Range   Sodium 138  135 - 145 (mEq/L)   Potassium 3.9  3.5 - 5.1 (mEq/L)   Chloride 106  96 - 112 (mEq/L)   CO2 22  19 - 32 (mEq/L)   Glucose, Bld 157 (*) 70 - 99  (mg/dL)   BUN 41 (*) 6 - 23 (mg/dL)   Creatinine, Ser 2.13 (*) 0.50 - 1.10 (mg/dL)   Calcium 7.6 (*) 8.4 - 10.5 (mg/dL)   Total Protein 4.7 (*) 6.0 - 8.3 (g/dL)   Albumin 2.3 (*) 3.5 - 5.2 (g/dL)   AST 30  0 - 37 (U/L)   ALT 17  0 - 35 (U/L)   Alkaline Phosphatase 53  39 - 117 (U/L)   Total Bilirubin 0.2 (*) 0.3 - 1.2 (mg/dL)   GFR calc non Af Amer 39 (*) >90 (mL/min)   GFR calc Af Amer 45 (*) >90 (mL/min)  GLUCOSE, CAPILLARY     Status: Abnormal   Collection Time   05/28/12  6:30 AM      Component Value Range   Glucose-Capillary 149 (*)  70 - 99 (mg/dL)   Comment 1 Notify RN      Dg Ribs Unilateral W/chest Right  05/26/2012  *RADIOLOGY REPORT*  Clinical Data: Fall, chest pain  RIGHT RIBS AND CHEST - 3+ VIEW  Comparison: Chest radiographs dated 05/26/2012  Findings: No right rib fracture is seen.  Displaced right proximal humerus fracture.  Visualized right lung is clear.  IMPRESSION: No right rib fracture is seen.  Displaced right proximal humerus fracture.  Original Report Authenticated By: Charline Bills, M.D.   Dg Shoulder Right  05/26/2012  *RADIOLOGY REPORT*  Clinical Data: Fall down stairs, severe right shoulder fracture  RIGHT SHOULDER - 2+ VIEW  Comparison: None.  Findings: Evaluation is constrained due to difficulty with patient positioning.  Suspected three-part proximal humeral fracture.  Additional mild comminution.  Dominant component is a displaced femoral neck fracture.  Distal fracture fragments are inferiorly/medially displaced.  IMPRESSION: Evaluation is constrained due to difficulty with patient positioning.  Suspected three-part proximal humeral fracture.  Dominant component is a displaced femoral neck fracture, as above.  Original Report Authenticated By: Charline Bills, M.D.   Dg Wrist Complete Left  05/26/2012  *RADIOLOGY REPORT*  Clinical Data: Fall down stairs, wrist pain  LEFT WRIST - COMPLETE 3+ VIEW  Comparison: None.  Findings: Mild deformity of the distal  radius, favored to be chronic.  Otherwise, no evidence of fracture or dislocation.  Degenerative changes.  The visualized soft tissues are unremarkable.  IMPRESSION: Mild deformity of the distal radius, favored to be chronic. Correlate with the site of the patient's pain.  Otherwise, no evidence of fracture or dislocation.  Original Report Authenticated By: Charline Bills, M.D.   Dg Tibia/fibula Right  05/26/2012  *RADIOLOGY REPORT*  Clinical Data: Fall, anterior midshaft tibial pain  RIGHT TIBIA AND FIBULA - 2 VIEW  Comparison: None.  Findings: No fracture or dislocation is seen.  The joint spaces are preserved.  Mild soft tissue swelling overlying the anterior proximal tibia.  IMPRESSION: No fracture or dislocation is seen.  Mild soft tissue swelling.  Original Report Authenticated By: Charline Bills, M.D.   Ct Head Wo Contrast  05/26/2012  *RADIOLOGY REPORT*  Clinical Data:  Fall, head/neck pain  CT HEAD WITHOUT CONTRAST CT CERVICAL SPINE WITHOUT CONTRAST  Technique:  Multidetector CT imaging of the head and cervical spine was performed following the standard protocol without intravenous contrast.  Multiplanar CT image reconstructions of the cervical spine were also generated.  Comparison:  None.  CT HEAD  Findings: No evidence of parenchymal hemorrhage or extra-axial fluid collection. No mass lesion, mass effect, or midline shift.  No CT evidence of acute infarction.  Mild intracranial atherosclerosis.  Cerebral volume is age appropriate.  No ventriculomegaly.  The visualized paranasal sinuses are essentially clear. The mastoid air cells are unopacified.  Large extracranial hematoma overlying the right frontotemporal region (series 3/image 21).  No evidence of calvarial fracture.  IMPRESSION: Large extracranial hematoma overlying the right frontotemporal region.  No evidence of calvarial fracture.  No evidence of acute intracranial abnormality.  CT CERVICAL SPINE  Findings: Normal cervical lordosis.  No  evidence of fracture or dislocation.  Vertebral body heights are maintained.  The dens appears intact.  No prevertebral soft tissue swelling.  Mild multilevel degenerative changes.  Visualized thyroid is unremarkable.  Visualized lung apices are clear.  IMPRESSION: No evidence of traumatic injury to the cervical spine.  Mild multilevel degenerative changes.  Original Report Authenticated By: Charline Bills, M.D.   Ct Cervical Spine  Wo Contrast  05/26/2012  *RADIOLOGY REPORT*  Clinical Data:  Fall, head/neck pain  CT HEAD WITHOUT CONTRAST CT CERVICAL SPINE WITHOUT CONTRAST  Technique:  Multidetector CT imaging of the head and cervical spine was performed following the standard protocol without intravenous contrast.  Multiplanar CT image reconstructions of the cervical spine were also generated.  Comparison:  None.  CT HEAD  Findings: No evidence of parenchymal hemorrhage or extra-axial fluid collection. No mass lesion, mass effect, or midline shift.  No CT evidence of acute infarction.  Mild intracranial atherosclerosis.  Cerebral volume is age appropriate.  No ventriculomegaly.  The visualized paranasal sinuses are essentially clear. The mastoid air cells are unopacified.  Large extracranial hematoma overlying the right frontotemporal region (series 3/image 21).  No evidence of calvarial fracture.  IMPRESSION: Large extracranial hematoma overlying the right frontotemporal region.  No evidence of calvarial fracture.  No evidence of acute intracranial abnormality.  CT CERVICAL SPINE  Findings: Normal cervical lordosis.  No evidence of fracture or dislocation.  Vertebral body heights are maintained.  The dens appears intact.  No prevertebral soft tissue swelling.  Mild multilevel degenerative changes.  Visualized thyroid is unremarkable.  Visualized lung apices are clear.  IMPRESSION: No evidence of traumatic injury to the cervical spine.  Mild multilevel degenerative changes.  Original Report Authenticated By:  Charline Bills, M.D.   Ct Abdomen Pelvis W Contrast  05/26/2012  *RADIOLOGY REPORT*  Clinical Data: Fall, abdominal pain, prior appendectomy, cholecystectomy, hysterectomy, and mastectomy  CT ABDOMEN AND PELVIS WITH CONTRAST  Technique:  Multidetector CT imaging of the abdomen and pelvis was performed following the standard protocol during bolus administration of intravenous contrast.  Contrast: OMNIPAQUE IOHEXOL 300 MG/ML  SOLN  Comparison: None.  Findings: Mild dependent atelectasis at the lung bases.  Left mastectomy with breast reconstruction.  Soft tissue hematoma along the right lateral chest wall (series 2/image 1).  Small hiatal hernia.  Multiple hepatic cysts.  Spleen, pancreas, and adrenal glands are within normal limits.  Status post cholecystectomy.  Mild intrahepatic/extrahepatic ductal dilatation, likely postsurgical.  Common duct measures 11 mm.  Kidneys are notable for bilateral renal sinus cysts.  No hydronephrosis.  No evidence of bowel obstruction.  Prior appendectomy.  Colonic diverticulosis, without associated inflammatory changes.  No evidence of abdominal aortic aneurysm.  No abdominopelvic ascites.  No hemoperitoneum.  No free air.  No suspicious abdominopelvic lymphadenopathy.  Status post hysterectomy.  Bladder is within normal limits.  Degenerative changes of the visualized thoracolumbar spine.  Mild to moderate superior endplate compression deformity at L1, chronic.  IMPRESSION: No evidence of traumatic injury to the abdomen/pelvis.  Soft tissue hematoma along the right lateral chest wall, likely extending inferiorly from the right shoulder.  Additional ancillary findings as above.  Original Report Authenticated By: Charline Bills, M.D.   Ct Shoulder Right Wo Contrast  05/27/2012  *RADIOLOGY REPORT*  Clinical Data: Status post fall.  Evaluate humeral fracture.  CT OF THE RIGHT SHOULDER WITHOUT CONTRAST  Technique:  Multidetector CT imaging was performed according to the  standard protocol. Multiplanar CT image reconstructions were also generated.  Comparison: Radiographs same date.  Findings: There is an extensively comminuted and displaced fracture of the proximal right humerus.  The humeral head is posteriorly and inferiorly subluxed with respect to the glenoid.  There is an extensively comminuted fracture involving the tuberosities. Transverse fracture through the humeral neck demonstrates marked anterior medial displacement, lodged along the anterior glenoid rim.  No glenoid or other scapular  fracture is demonstrated.  The right clavicle and acromioclavicular joint are intact.  There is moderate sized shoulder joint effusion.  There is diffuse soft tissue edema/hemorrhage within the right axilla and deltoid musculature.  IMPRESSION: Extensively comminuted and displaced fracture of the proximal right humerus as described.  The humeral head is posteriorly and inferiorly subluxed with respect to the glenoid.  Original Report Authenticated By: Gerrianne Scale, M.D.   Dg Pelvis Portable  05/26/2012  *RADIOLOGY REPORT*  Clinical Data: Fall, pelvic pain  PORTABLE PELVIS  Comparison: None.  Findings: There is no evidence of fracture or dislocation.  There is no evidence of arthropathy or other focal bony abnormality. Soft tissues are unremarkable.  IMPRESSION: Negative exam.  Original Report Authenticated By: Elsie Stain, M.D.   Dg Chest Port 1 View  05/26/2012  *RADIOLOGY REPORT*  Clinical Data: Fall, pain  PORTABLE CHEST - 1 VIEW  Comparison: None.  Findings: Cardiomegaly.  Left mastectomy and axillary node dissection.  Clear lung fields. No effusion or pneumothorax. Right shoulder fracture, incompletely evaluated. Apparent overriding fracture fragments. Consider upper extremity radiographs for further evaluation.  IMPRESSION: Cardiomegaly without active infiltrates, rib fracture, or pneumothorax.  Right shoulder fracture, incompletely evaluated.  Original Report  Authenticated By: Elsie Stain, M.D.   Dg Shoulder Right Port  05/27/2012  *RADIOLOGY REPORT*  Clinical Data: Fracture of the proximal right humerus.  PORTABLE RIGHT SHOULDER - 2+ VIEW  Comparison: CT 05/26/2002  Findings: Two views of the right shoulder demonstrate a total shoulder arthroplasty.  The patient has low lung volumes.  The shoulder appears to be grossly located on these two views.  IMPRESSION: Right shoulder arthroplasty without gross hardware complication.  Original Report Authenticated By: Richarda Overlie, M.D.   Dg Humerus Right  05/26/2012  *RADIOLOGY REPORT*  Clinical Data: Fall down stairs, severe right shoulder fracture  RIGHT HUMERUS - 2+ VIEW  Comparison: None.  Findings: Suspected three-part proximal humerus fracture.  See report from right shoulder radiographs for further description.  IMPRESSION: Suspected three-part proximal humerus fracture.  Original Report Authenticated By: Charline Bills, M.D.    Assessment/Plan: 1 Day Post-Op   Principal Problem:  *Closed fracture of right proximal humerus Active Problems:  Tachycardia  Hyperglycemia   Advance diet Up with therapy ABLA: will observe for now.  Pulse is improved.  Recheck h/h in am.    Haskel Khan 05/28/2012, 7:52 AM   Teryl Lucy, MD [phone] pager

## 2012-05-29 ENCOUNTER — Encounter (HOSPITAL_COMMUNITY): Payer: Self-pay | Admitting: Orthopedic Surgery

## 2012-05-29 DIAGNOSIS — R55 Syncope and collapse: Secondary | ICD-10-CM

## 2012-05-29 DIAGNOSIS — R000 Tachycardia, unspecified: Secondary | ICD-10-CM

## 2012-05-29 DIAGNOSIS — E782 Mixed hyperlipidemia: Secondary | ICD-10-CM

## 2012-05-29 DIAGNOSIS — D62 Acute posthemorrhagic anemia: Secondary | ICD-10-CM | POA: Diagnosis not present

## 2012-05-29 DIAGNOSIS — R7989 Other specified abnormal findings of blood chemistry: Secondary | ICD-10-CM

## 2012-05-29 LAB — BASIC METABOLIC PANEL
CO2: 19 mEq/L (ref 19–32)
Calcium: 7.7 mg/dL — ABNORMAL LOW (ref 8.4–10.5)
Chloride: 105 mEq/L (ref 96–112)
Potassium: 4.5 mEq/L (ref 3.5–5.1)
Sodium: 135 mEq/L (ref 135–145)

## 2012-05-29 LAB — URINE CULTURE
Colony Count: NO GROWTH
Culture  Setup Time: 201306100218

## 2012-05-29 LAB — CBC
MCV: 86.9 fL (ref 78.0–100.0)
Platelets: 183 10*3/uL (ref 150–400)
RBC: 2.67 MIL/uL — ABNORMAL LOW (ref 3.87–5.11)
WBC: 9.6 10*3/uL (ref 4.0–10.5)

## 2012-05-29 LAB — GLUCOSE, CAPILLARY
Glucose-Capillary: 177 mg/dL — ABNORMAL HIGH (ref 70–99)
Glucose-Capillary: 148 mg/dL — ABNORMAL HIGH (ref 70–99)
Glucose-Capillary: 151 mg/dL — ABNORMAL HIGH (ref 70–99)
Glucose-Capillary: 178 mg/dL — ABNORMAL HIGH (ref 70–99)
Glucose-Capillary: 135 mg/dL — ABNORMAL HIGH (ref 70–99)

## 2012-05-29 LAB — PREPARE RBC (CROSSMATCH)

## 2012-05-29 LAB — ABO/RH: ABO/RH(D): B POS

## 2012-05-29 MED ORDER — LEVOFLOXACIN 500 MG PO TABS
500.0000 mg | ORAL_TABLET | ORAL | Status: DC
  Administered 2012-05-29: 500 mg via ORAL
  Filled 2012-05-29 (×2): qty 1

## 2012-05-29 MED ORDER — DIPHENHYDRAMINE HCL 25 MG PO CAPS
25.0000 mg | ORAL_CAPSULE | Freq: Once | ORAL | Status: AC
  Administered 2012-05-29: 25 mg via ORAL
  Filled 2012-05-29: qty 1

## 2012-05-29 MED ORDER — ACETAMINOPHEN 325 MG PO TABS
650.0000 mg | ORAL_TABLET | Freq: Once | ORAL | Status: AC
  Administered 2012-05-29: 650 mg via ORAL

## 2012-05-29 NOTE — Progress Notes (Signed)
Patient explained that she has not been able to void much after the removal of her foley. We got her up to [facility] where she voided 200. The post void residual from the subsequent bladder scan showed 624. An In and Out cath was  Performed with an output of 750. Will continue to monitor throughout the night. Michaela Rhodes, [name]

## 2012-05-29 NOTE — Progress Notes (Signed)
Subjective:  Patient reports pain as mild.  Feels a little "woozy".  Objective:   VITALS:   Filed Vitals:   05/28/12 1451 05/28/12 1800 05/28/12 2100 05/29/12 0402  BP:  126/57 127/59 130/59  Pulse:  104 92 83  Temp:  98.7 F (37.1 C) 98.8 F (37.1 C) 99 F (37.2 C)  TempSrc:  Oral Oral Oral  Resp:  18 18 18   Height:      Weight:    98.8 kg (217 lb 13 oz)  SpO2: 94% 92% 97% 96%   Dressing clean. Neurologically intact Intact pulses distally  LABS  Results for orders placed during the hospital encounter of 05/26/12 (from the past 24 hour(s))  GLUCOSE, CAPILLARY     Status: Abnormal   Collection Time   05/28/12 11:10 AM      Component Value Range   Glucose-Capillary 159 (*) 70 - 99 (mg/dL)   Comment 1 Notify RN    GLUCOSE, CAPILLARY     Status: Abnormal   Collection Time   05/28/12  3:56 PM      Component Value Range   Glucose-Capillary 147 (*) 70 - 99 (mg/dL)   Comment 1 Notify RN    GLUCOSE, CAPILLARY     Status: Abnormal   Collection Time   05/28/12  9:04 PM      Component Value Range   Glucose-Capillary 169 (*) 70 - 99 (mg/dL)   Comment 1 Notify RN    BASIC METABOLIC PANEL     Status: Abnormal   Collection Time   05/29/12  5:00 AM      Component Value Range   Sodium 135  135 - 145 (mEq/L)   Potassium 4.5  3.5 - 5.1 (mEq/L)   Chloride 105  96 - 112 (mEq/L)   CO2 19  19 - 32 (mEq/L)   Glucose, Bld 162 (*) 70 - 99 (mg/dL)   BUN 36 (*) 6 - 23 (mg/dL)   Creatinine, Ser 1.30  0.50 - 1.10 (mg/dL)   Calcium 7.7 (*) 8.4 - 10.5 (mg/dL)   GFR calc non Af Amer 54 (*) >90 (mL/min)   GFR calc Af Amer 62 (*) >90 (mL/min)  CBC     Status: Abnormal   Collection Time   05/29/12  5:00 AM      Component Value Range   WBC 9.6  4.0 - 10.5 (K/uL)   RBC 2.67 (*) 3.87 - 5.11 (MIL/uL)   Hemoglobin 7.5 (*) 12.0 - 15.0 (g/dL)   HCT 86.5 (*) 78.4 - 46.0 (%)   MCV 86.9  78.0 - 100.0 (fL)   MCH 28.1  26.0 - 34.0 (pg)   MCHC 32.3  30.0 - 36.0 (g/dL)   RDW 69.6  29.5 - 28.4  (%)   Platelets 183  150 - 400 (K/uL)  GLUCOSE, CAPILLARY     Status: Abnormal   Collection Time   05/29/12  6:25 AM      Component Value Range   Glucose-Capillary 151 (*) 70 - 99 (mg/dL)   Comment 1 Notify RN      Dg Shoulder Right Port  05/27/2012  *RADIOLOGY REPORT*  Clinical Data: Fracture of the proximal right humerus.  PORTABLE RIGHT SHOULDER - 2+ VIEW  Comparison: CT 05/26/2002  Findings: Two views of the right shoulder demonstrate a total shoulder arthroplasty.  The patient has low lung volumes.  The shoulder appears to be grossly located on these two views.  IMPRESSION: Right shoulder arthroplasty without gross  hardware complication.  Original Report Authenticated By: Richarda Overlie, M.D.    Assessment/Plan: 2 Days Post-Op   Principal Problem:  *Closed fracture of right proximal humerus Active Problems:  Tachycardia  Hyperglycemia  Postoperative anemia due to acute blood loss will transfuse 2 units prbcs.  Advance diet Up with therapy Discharge to SNF   Michaela Rhodes P 05/29/2012, 8:52 AM   Teryl Lucy, MD [phone] pager

## 2012-05-29 NOTE — Progress Notes (Signed)
Clinical Social Work Department BRIEF PSYCHOSOCIAL ASSESSMENT 05/29/2012  Patient:  Michaela Rhodes, Michaela Rhodes     Account Number:  [id number]     Admit date:  05/26/2012  Clinical Social Worker:  Juliette Mangle  Date/Time:  05/29/2012 11:00 AM  Referred by:  Physician  Date Referred:  05/28/2012 Referred for  SNF Placement   Other Referral:   Interview type:  Patient Other interview type:    PSYCHOSOCIAL DATA Living Status:  ALONE Admitted from facility:   Level of care:   Primary support name:  Michaela Rhodes Primary support relationship to patient:  FAMILY Degree of support available:   Good    CURRENT CONCERNS Current Concerns  Post-Acute Placement   Other Concerns:    SOCIAL WORK ASSESSMENT / PLAN CSW received a referral for SNF placement. CSW met with patient at bedside.  CSW explained role, offered support and discussed SNF placement. Patient is agreeable to this. Patient reported that she lives alone but has help from her son and daughter-in-law. CSW will fax out patient's information and provide her with bed offers. Patient requested that CSW contact her daughter-in-law, Nicholos Johns for decisions.  CSW agreed to do so.   Assessment/plan status:  Psychosocial Support/Ongoing Assessment of Needs Other assessment/ plan:   Information/referral to community resources:   Provided patient with choice list of facilities.    PATIENT'S/FAMILY'S RESPONSE TO PLAN OF CARE: patient was appreciative and receptive to information provided by CSW. CSW will contact patient's daughter-in-law annd continue to follow and assist with all d/c needs.   Sabino Niemann, MSW, [employer] [phone]

## 2012-05-29 NOTE — Progress Notes (Signed)
Attempt PIV placement using [place] to locate and access vein in LLFA and left AC - veins accessed with blood return but unable to thread into vein. VSandritter RN/VABC

## 2012-05-29 NOTE — Progress Notes (Signed)
OT Cancellation Note  Treatment cancelled today due to medical issues with patient which prohibited therapy. Pt. With hgb of 7.5, will re-attempt after hgb increases.  Arieanna Pressey, OTR/L Pager [phone] 05/29/2012, 1:11 PM

## 2012-05-29 NOTE — Progress Notes (Signed)
Utilization Review Completed.Sandra Tellefsen T6/10/2012   

## 2012-05-29 NOTE — Progress Notes (Signed)
Unable to give blood, lost IV, discussed options with patient and family (who is an ED physician), they have elected to forego blood transfusion.  Will continue to monitor for severe symptoms.  DC transfusion at this time.

## 2012-05-29 NOTE — Progress Notes (Signed)
Pt/family did not want pt to receive blood at this time , IV team unable to get I IV ,see note on this please , I called MD Landau to inform him of this and gave number for him to call family  Waiting on further orders

## 2012-05-29 NOTE — Progress Notes (Deleted)
Clinical Social Work Department BRIEF PSYCHOSOCIAL ASSESSMENT 05/29/2012  Patient:  Michaela Rhodes     Account Number:  [id number]     Admit date:  05/25/2012  Clinical Social Worker:  Juliette Mangle  Date/Time:  05/29/2012 02:00 PM  Referred by:  Physician  Date Referred:  05/28/2012 Referred for  SNF Placement   Other Referral:   Interview type:  Patient Other interview type:    PSYCHOSOCIAL DATA Living Status:  ALONE Admitted from facility:   Level of care:   Primary support name:  Michaela Rhodes Primary support relationship to patient:  FRIEND Degree of support available:   Poor    CURRENT CONCERNS Current Concerns  Post-Acute Placement   Other Concerns:    SOCIAL WORK ASSESSMENT / PLAN CSW received a referral for SNF placement. CSW met with patient at bedside.  CSW explained role, offered support and discussed SNF placement. Patient is agreeable to this. Patient reported that she lives alone and has very little support. CSW provided patient with choice list. CSW  will fax out patient's information and provide her with bed offers   Assessment/plan status:  Psychosocial Support/Ongoing Assessment of Needs Other assessment/ plan:   Information/referral to community resources:   CSW provided patient with choice list.    PATIENT'S/FAMILY'S RESPONSE TO PLAN OF CARE: patient was appreciative and receptive to information provided by CSW. Patient is agreeable to the discharge disposition    Michaela Rhodes, MSW, [place] [phone]

## 2012-05-29 NOTE — Progress Notes (Signed)
Pt helped up to [facility]  Will sit up for dinner tolerated well CMS to right arm remains unchanged and sling in place

## 2012-05-29 NOTE — Progress Notes (Addendum)
Clinical Social Work Department CLINICAL SOCIAL WORK PLACEMENT NOTE 05/29/2012  Patient:  Michaela Rhodes, Michaela Rhodes  Account Number:  [id number] Admit date:  05/26/2012  Clinical Social Worker:  Faven Watterson Lubertha Basque  Date/time:  05/29/2012 11:00 AM  Clinical Social Work is seeking post-discharge placement for this patient at the following level of care:   SKILLED NURSING   (*CSW will update this form in Epic as items are completed)   05/29/2012  Patient/family provided with [facility] Department of Clinical Social Work's list of facilities offering this level of care within the geographic area requested by the patient (or if unable, by the patient's family).  05/29/2012  Patient/family informed of their freedom to choose among providers that offer the needed level of care, that participate in Medicare, Medicaid or managed care program needed by the patient, have an available bed and are willing to accept the patient.  05/29/2012  Patient/family informed of [facility]' ownership interest in [facility], as well as of the fact that they are under no obligation to receive care at this facility.  PASARR submitted to EDS on 05/29/2012 PASARR number received from EDS on 05/29/2012  FL2 transmitted to all facilities in geographic area requested by pt/family on  05/29/2012 FL2 transmitted to all facilities within larger geographic area on 05/29/2012  Patient informed that his/her managed care company has contracts with or will negotiate with  certain facilities, including the following:     Patient/family informed of bed offers received:  05/29/2012 Patient chooses bed at [facility] Physician recommends and patient chooses bed at   [facility]   Patient to be transferred to  on  05/30/12 Patient to be transferred to facility by  05/30/12   The following physician request were entered in Epic:   Additional Comments:  Sabino Niemann, MSW,  LCSWA [phone]

## 2012-05-29 NOTE — Progress Notes (Signed)
[facility] Progress Note  05/29/2012  Subjective: Pt says she is in NO pain.  She denies CP, SOB or palpatations.  She is eating this morning.    Objective:  Vital signs in last 24 hours: Filed Vitals:   05/28/12 1800 05/28/12 2100 05/29/12 0402 05/29/12 1133  BP: 126/57 127/59 130/59 100/67  Pulse: 104 92 83 74  Temp: 98.7 F (37.1 C) 98.8 F (37.1 C) 99 F (37.2 C) 98 F (36.7 C)  TempSrc: Oral Oral Oral Oral  Resp: 18 18 18 18   Height:      Weight:   98.8 kg (217 lb 13 oz)   SpO2: 92% 97% 96% 99%   Weight change: 0.6 kg (1 lb 5.2 oz)  Intake/Output Summary (Last 24 hours) at 05/29/12 1214 Last data filed at 05/29/12 0700  Gross per 24 hour  Intake 2206.25 ml  Output   1150 ml  Net 1056.25 ml   Lab Results  Component Value Date   HGBA1C 6.0* 05/28/2012   Lab Results  Component Value Date   CREATININE 1.01 05/29/2012    Review of Systems As above, otherwise all reviewed and reported negative  Physical Exam General - awake, no distress, cooperative  HEENT - NCAT, MMM Lungs - BBS, clear  CV - normal s1, s2 sounds  Abd - soft, nondistended, no masses, nontender  Ext - right shoulder sling  Lab Results: Results for orders placed during the hospital encounter of 05/26/12 (from the past 24 hour(s))  GLUCOSE, CAPILLARY     Status: Abnormal   Collection Time   05/28/12  3:56 PM      Component Value Range   Glucose-Capillary 147 (*) 70 - 99 (mg/dL)   Comment 1 Notify RN    GLUCOSE, CAPILLARY     Status: Abnormal   Collection Time   05/28/12  9:04 PM      Component Value Range   Glucose-Capillary 169 (*) 70 - 99 (mg/dL)   Comment 1 Notify RN    BASIC METABOLIC PANEL     Status: Abnormal   Collection Time   05/29/12  5:00 AM      Component Value Range   Sodium 135  135 - 145 (mEq/L)   Potassium 4.5  3.5 - 5.1 (mEq/L)   Chloride 105  96 - 112 (mEq/L)   CO2 19  19 - 32 (mEq/L)   Glucose, Bld 162 (*) 70 - 99 (mg/dL)   BUN 36 (*) 6 - 23 (mg/dL)   Creatinine, Ser 4.09  0.50 - 1.10 (mg/dL)   Calcium 7.7 (*) 8.4 - 10.5 (mg/dL)   GFR calc non Af Amer 54 (*) >90 (mL/min)   GFR calc Af Amer 62 (*) >90 (mL/min)  CBC     Status: Abnormal   Collection Time   05/29/12  5:00 AM      Component Value Range   WBC 9.6  4.0 - 10.5 (K/uL)   RBC 2.67 (*) 3.87 - 5.11 (MIL/uL)   Hemoglobin 7.5 (*) 12.0 - 15.0 (g/dL)   HCT 81.1 (*) 91.4 - 46.0 (%)   MCV 86.9  78.0 - 100.0 (fL)   MCH 28.1  26.0 - 34.0 (pg)   MCHC 32.3  30.0 - 36.0 (g/dL)   RDW 78.2  95.6 - 21.3 (%)   Platelets 183  150 - 400 (K/uL)  GLUCOSE, CAPILLARY     Status: Abnormal   Collection Time   05/29/12  6:25 AM      Component  Value Range   Glucose-Capillary 151 (*) 70 - 99 (mg/dL)   Comment 1 Notify RN    TYPE AND SCREEN     Status: Normal (Preliminary result)   Collection Time   05/29/12  8:50 AM      Component Value Range   ABO/RH(D) B POS     Antibody Screen NEG     Sample Expiration 06/01/2012     Unit Number [id number]     Blood Component Type RED CELLS,LR     Unit division 00     Status of Unit ALLOCATED     Transfusion Status OK TO TRANSFUSE     Crossmatch Result Compatible     Unit Number [id number]     Blood Component Type RED CELLS,LR     Unit division 00     Status of Unit ALLOCATED     Transfusion Status OK TO TRANSFUSE     Crossmatch Result Compatible    PREPARE RBC (CROSSMATCH)     Status: Normal   Collection Time   05/29/12  8:50 AM      Component Value Range   Order Confirmation ORDER PROCESSED BY BLOOD BANK    ABO/RH     Status: Normal   Collection Time   05/29/12  8:50 AM      Component Value Range   ABO/RH(D) B POS    GLUCOSE, CAPILLARY     Status: Abnormal   Collection Time   05/29/12 11:14 AM      Component Value Range   Glucose-Capillary 178 (*) 70 - 99 (mg/dL)   Comment 1 Notify RN      Micro Results: Recent Results (from the past 240 hour(s))  SURGICAL PCR SCREEN     Status: Normal   Collection Time   05/27/12  3:41 AM      Component  Value Range Status Comment   MRSA, PCR NEGATIVE  NEGATIVE  Final    Staphylococcus aureus NEGATIVE  NEGATIVE  Final   URINE CULTURE     Status: Normal   Collection Time   05/27/12  7:00 PM      Component Value Range Status Comment   Specimen Description URINE, RANDOM   Final    Special Requests NONE   Final    Culture  Setup Time [id number]   Final    Colony Count NO GROWTH   Final    Culture NO GROWTH   Final    Report Status 05/29/2012 FINAL   Final     Medications:  Scheduled Meds:   . acetaminophen  650 mg Oral Once  . amLODipine  5 mg Oral Daily  . diphenhydrAMINE  25 mg Oral Once  . docusate sodium  100 mg Oral BID  . lisinopril  20 mg Oral Daily   And  . hydrochlorothiazide  25 mg Oral Daily  . insulin aspart  0-15 Units Subcutaneous TID WC  . levofloxacin (LEVAQUIN) IV  500 mg Intravenous Q24H  . levothyroxine  88 mcg Oral QAC breakfast  . metoprolol tartrate  50 mg Oral BID  . senna  1 tablet Oral BID  . simvastatin  20 mg Oral q1800   Continuous Infusions:   . 0.45 % NaCl with KCl 20 mEq / L 75 mL/hr at 05/29/12 1033   PRN Meds:.acetaminophen, acetaminophen, alum & mag hydroxide-simeth, diphenhydrAMINE, HYDROmorphone (DILAUDID) injection, menthol-cetylpyridinium, methocarbamol (ROBAXIN) IV, methocarbamol, metoCLOPramide (REGLAN) injection, metoCLOPramide, ondansetron (ZOFRAN) IV, ondansetron, oxyCODONE, oxyCODONE-acetaminophen, phenol, zolpidem  Assessment/Plan:  POD 2  s/p total right shoulder arthroplasty  - post op mgmt per orthopedics team  - pain well controlled  - to SNF rehab when bed available  HTN  - continue home amlodipine, hctz and lisinopril holding for SBP<110. continue metoprolol as well - echo reviewed, normal systolic heart function, pt notified of results   Anemia  - likely from hydration and post op  - transfusing PRBCs today per ortho  Hypothyroidism  - resume home meds   SVT  - continue the metoprolol   DM 2  - controlled with  supplemental insulin  - holding oral meds while in hospital  - change to carb modified diet  UTI  - change Levaquin to p.o.    ARF vs CRF  - IV fluids (monitoring volume status)  - improved   LOS: 3 days   Oneisha Ammons 05/29/2012, 12:14 PM  Cleora Fleet, MD, CDE, FAAFP [facility] [place], [place]  [phone]

## 2012-05-30 DIAGNOSIS — IMO0002 Reserved for concepts with insufficient information to code with codable children: Secondary | ICD-10-CM | POA: Diagnosis present

## 2012-05-30 DIAGNOSIS — E1165 Type 2 diabetes mellitus with hyperglycemia: Secondary | ICD-10-CM | POA: Diagnosis present

## 2012-05-30 DIAGNOSIS — N179 Acute kidney failure, unspecified: Secondary | ICD-10-CM | POA: Diagnosis present

## 2012-05-30 DIAGNOSIS — E782 Mixed hyperlipidemia: Secondary | ICD-10-CM

## 2012-05-30 DIAGNOSIS — E119 Type 2 diabetes mellitus without complications: Secondary | ICD-10-CM

## 2012-05-30 DIAGNOSIS — I10 Essential (primary) hypertension: Secondary | ICD-10-CM | POA: Diagnosis present

## 2012-05-30 LAB — GLUCOSE, CAPILLARY: Glucose-Capillary: 183 mg/dL — ABNORMAL HIGH (ref 70–99)

## 2012-05-30 LAB — CBC
HCT: 22.1 % — ABNORMAL LOW (ref 36.0–46.0)
Hemoglobin: 7.4 g/dL — ABNORMAL LOW (ref 12.0–15.0)
MCH: 28.5 pg (ref 26.0–34.0)
MCHC: 33.5 g/dL (ref 30.0–36.0)
RDW: 13.6 % (ref 11.5–15.5)

## 2012-05-30 LAB — BASIC METABOLIC PANEL
BUN: 28 mg/dL — ABNORMAL HIGH (ref 6–23)
Calcium: 8.2 mg/dL — ABNORMAL LOW (ref 8.4–10.5)
GFR calc Af Amer: 68 mL/min — ABNORMAL LOW (ref >90)
GFR calc non Af Amer: 59 mL/min — ABNORMAL LOW (ref >90)
Glucose, Bld: 149 mg/dL — ABNORMAL HIGH (ref 70–99)
Sodium: 136 mEq/L (ref 135–145)

## 2012-05-30 MED ORDER — SENNA 8.6 MG PO TABS: 1.0000 | ORAL_TABLET | Freq: Two times a day (BID) | ORAL | Status: DC

## 2012-05-30 MED ORDER — DSS 100 MG PO CAPS: 100.0000 mg | ORAL_CAPSULE | Freq: Two times a day (BID) | ORAL | Status: AC

## 2012-05-30 NOTE — Progress Notes (Signed)
Discussed discharge instructions with pt including signs and symptoms of infx, how and when to call the diet, wound care, diet, activity restrictions, follow up appts, and medications. Pt verbalized understanding and denied any questions. Pt is to be transported to [facility] via ambulance. Will continue to monitor pt closely until ambulance arrives.  Juliane Lack, RN

## 2012-05-30 NOTE — Consult Note (Signed)
[facility] PROGRESS NOTE  Michaela Rhodes ZHY:[id number] DOB: Jul 23, 1936 DOA: 05/26/2012 PCP: No primary provider on file. [facility] Requesting physician: Teryl Lucy, MD Reason for consultation: Management of diabetes, thyroid issues.  Attending service: Orthopedics  Impression/Recommendations: 1. Status post fall with right humerus fracture: Patient denied syncope. May have lost her balance. Syncope evaluation unremarkable. Blood sugar was high on admission. 2. Status post total right shoulder arthroplasty: Per orthopedics. 3. Hypertension: Stable. Tachycardic on admission secondary to missed metoprolol dose at home. Continue amlodipine, hydrochlorothiazide, lisinopril, metoprolol.  4. History of SVT: Stable. Continue metoprolol. 5. Diabetes mellitus type 2: Stable. Hemoglobin A1c 6.0. Continue oral medications on discharge.  6. Possible UTI: Completed a course of Levaquin. Urine culture was unrevealing. 7. Acute renal failure: Appears resolved. 8. Acute blood loss anemia: Stable. Patient refused transfusion. 9. Hypothyroidism: Continue replacement therapy.  Medical issues appear stable. Will sign off. Please call for any questions.  Family Communication: None bedside Disposition Plan: Skilled nursing facility per orthopedics today.  Brendia Sacks, MD  [facility] Pager [phone]. If 8PM-8AM, please contact night-coverage at [email or web address], password [facility] 05/30/2012, 9:27 AM  LOS: 4 days   Brief narrative: 76 year old woman presented to the emergency department status post fall with shoulder fracture.  Other Consultants:  Physical therapy: Skilled nursing facility  Occupational therapy: Skilled nursing facility  Procedures:  05/27/2012 Total shoulder arthroplasty, reverse  05/28/2012 Carotid duplex: No significant bilateral ICA stenosis with antegrade vertebral flow bilaterally.  05/28/2012 2-D echocardiogram: Left  ventricular ejection fraction 50-55%, normal. Grade 2 diastolic dysfunction. Pulmonary hypertension.  HPI/Subjective: Feels okay.  Objective: Filed Vitals:   05/29/12 2137 05/29/12 2148 05/30/12 0502 05/30/12 0903  BP:  121/55 136/55 133/68  Pulse: 82 82 74 135  Temp:  98.9 F (37.2 C) 98.2 F (36.8 C) 98.2 F (36.8 C)  TempSrc:  Oral Oral Oral  Resp:  20 18 18   Height:      Weight:   97.705 kg (215 lb 6.4 oz)   SpO2:  99% 100% 94%    Intake/Output Summary (Last 24 hours) at 05/30/12 0927 Last data filed at 05/30/12 0819  Gross per 24 hour  Intake    480 ml  Output   2025 ml  Net  -1545 ml    Exam:   General:  Appears calm and comfortable.  Cardiovascular: Regular rate and rhythm. No murmur, rub, gallop.  Respiratory: Clear to auscultation bilaterally. No wheezes, rales, rhonchi. Normal respiratory effort.  Psychiatric: Grossly normal mood and affect. Speech fluent and appropriate.  Data Reviewed: Basic Metabolic Panel:  Lab 05/30/12 3244 05/29/12 0500 05/28/12 0555 05/26/12 2036  NA 136 135 138 140  K 3.7 4.5 3.9 3.9  CL 103 105 106 105  CO2 24 19 22  --  GLUCOSE 149* 162* 157* 262*  BUN 28* 36* 41* 43*  CREATININE 0.94 1.01 1.32* 1.20*  CALCIUM 8.2* 7.7* 7.6* --  MG -- -- -- --  PHOS -- -- -- --   Liver Function Tests:  Lab 05/28/12 0555  AST 30  ALT 17  ALKPHOS 53  BILITOT 0.2*  PROT 4.7*  ALBUMIN 2.3*   CBC:  Lab 05/30/12 0650 05/29/12 0500 05/28/12 0555 05/26/12 2036 05/26/12 2023  WBC 9.4 9.6 9.3 -- 23.4*  NEUTROABS -- -- -- -- --  HGB 7.4* 7.5* 8.3* 13.3 12.6  HCT 22.1* 23.2* 24.5* 39.0 37.7  MCV 85.0 86.9 85.7 -- 84.0  PLT 209 183 200 -- 315  CBG:  Lab 05/30/12 0554 05/29/12 2139 05/29/12 1558 05/29/12 1114 05/29/12 0625  GLUCAP 127* 179* 135* 178* 151*    Recent Results (from the past 240 hour(s))  SURGICAL PCR SCREEN     Status: Normal   Collection Time   05/27/12  3:41 AM      Component Value Range Status Comment   MRSA,  PCR NEGATIVE  NEGATIVE Final    Staphylococcus aureus NEGATIVE  NEGATIVE Final   URINE CULTURE     Status: Normal   Collection Time   05/27/12  7:00 PM      Component Value Range Status Comment   Specimen Description URINE, RANDOM   Final    Special Requests NONE   Final    Culture  Setup Time [id number]   Final    Colony Count NO GROWTH   Final    Culture NO GROWTH   Final    Report Status 05/29/2012 FINAL   Final      Studies: Dg Ribs Unilateral W/chest Right  05/26/2012  *RADIOLOGY REPORT*  Clinical Data: Fall, chest pain  RIGHT RIBS AND CHEST - 3+ VIEW  Comparison: Chest radiographs dated 05/26/2012  Findings: No right rib fracture is seen.  Displaced right proximal humerus fracture.  Visualized right lung is clear.  IMPRESSION: No right rib fracture is seen.  Displaced right proximal humerus fracture.  Original Report Authenticated By: Charline Bills, M.D.   Dg Shoulder Right  05/26/2012  *RADIOLOGY REPORT*  Clinical Data: Fall down stairs, severe right shoulder fracture  RIGHT SHOULDER - 2+ VIEW  Comparison: None.  Findings: Evaluation is constrained due to difficulty with patient positioning.  Suspected three-part proximal humeral fracture.  Additional mild comminution.  Dominant component is a displaced femoral neck fracture.  Distal fracture fragments are inferiorly/medially displaced.  IMPRESSION: Evaluation is constrained due to difficulty with patient positioning.  Suspected three-part proximal humeral fracture.  Dominant component is a displaced femoral neck fracture, as above.  Original Report Authenticated By: Charline Bills, M.D.   Dg Wrist Complete Left  05/26/2012  *RADIOLOGY REPORT*  Clinical Data: Fall down stairs, wrist pain  LEFT WRIST - COMPLETE 3+ VIEW  Comparison: None.  Findings: Mild deformity of the distal radius, favored to be chronic.  Otherwise, no evidence of fracture or dislocation.  Degenerative changes.  The visualized soft tissues are unremarkable.   IMPRESSION: Mild deformity of the distal radius, favored to be chronic. Correlate with the site of the patient's pain.  Otherwise, no evidence of fracture or dislocation.  Original Report Authenticated By: Charline Bills, M.D.   Dg Tibia/fibula Right  05/26/2012  *RADIOLOGY REPORT*  Clinical Data: Fall, anterior midshaft tibial pain  RIGHT TIBIA AND FIBULA - 2 VIEW  Comparison: None.  Findings: No fracture or dislocation is seen.  The joint spaces are preserved.  Mild soft tissue swelling overlying the anterior proximal tibia.  IMPRESSION: No fracture or dislocation is seen.  Mild soft tissue swelling.  Original Report Authenticated By: Charline Bills, M.D.   Ct Head Wo Contrast  05/26/2012  *RADIOLOGY REPORT*  Clinical Data:  Fall, head/neck pain  CT HEAD WITHOUT CONTRAST CT CERVICAL SPINE WITHOUT CONTRAST  Technique:  Multidetector CT imaging of the head and cervical spine was performed following the standard protocol without intravenous contrast.  Multiplanar CT image reconstructions of the cervical spine were also generated.  Comparison:  None.  CT HEAD  Findings: No evidence of parenchymal hemorrhage or extra-axial fluid collection. No mass lesion, mass effect,  or midline shift.  No CT evidence of acute infarction.  Mild intracranial atherosclerosis.  Cerebral volume is age appropriate.  No ventriculomegaly.  The visualized paranasal sinuses are essentially clear. The mastoid air cells are unopacified.  Large extracranial hematoma overlying the right frontotemporal region (series 3/image 21).  No evidence of calvarial fracture.  IMPRESSION: Large extracranial hematoma overlying the right frontotemporal region.  No evidence of calvarial fracture.  No evidence of acute intracranial abnormality.  CT CERVICAL SPINE  Findings: Normal cervical lordosis.  No evidence of fracture or dislocation.  Vertebral body heights are maintained.  The dens appears intact.  No prevertebral soft tissue swelling.  Mild  multilevel degenerative changes.  Visualized thyroid is unremarkable.  Visualized lung apices are clear.  IMPRESSION: No evidence of traumatic injury to the cervical spine.  Mild multilevel degenerative changes.  Original Report Authenticated By: Charline Bills, M.D.   Ct Abdomen Pelvis W Contrast  05/26/2012  *RADIOLOGY REPORT*  Clinical Data: Fall, abdominal pain, prior appendectomy, cholecystectomy, hysterectomy, and mastectomy  CT ABDOMEN AND PELVIS WITH CONTRAST  Technique:  Multidetector CT imaging of the abdomen and pelvis was performed following the standard protocol during bolus administration of intravenous contrast.  Contrast: OMNIPAQUE IOHEXOL 300 MG/ML  SOLN  Comparison: None.  Findings: Mild dependent atelectasis at the lung bases.  Left mastectomy with breast reconstruction.  Soft tissue hematoma along the right lateral chest wall (series 2/image 1).  Small hiatal hernia.  Multiple hepatic cysts.  Spleen, pancreas, and adrenal glands are within normal limits.  Status post cholecystectomy.  Mild intrahepatic/extrahepatic ductal dilatation, likely postsurgical.  Common duct measures 11 mm.  Kidneys are notable for bilateral renal sinus cysts.  No hydronephrosis.  No evidence of bowel obstruction.  Prior appendectomy.  Colonic diverticulosis, without associated inflammatory changes.  No evidence of abdominal aortic aneurysm.  No abdominopelvic ascites.  No hemoperitoneum.  No free air.  No suspicious abdominopelvic lymphadenopathy.  Status post hysterectomy.  Bladder is within normal limits.  Degenerative changes of the visualized thoracolumbar spine.  Mild to moderate superior endplate compression deformity at L1, chronic.  IMPRESSION: No evidence of traumatic injury to the abdomen/pelvis.  Soft tissue hematoma along the right lateral chest wall, likely extending inferiorly from the right shoulder.  Additional ancillary findings as above.  Original Report Authenticated By: Charline Bills,  M.D.   Ct Shoulder Right Wo Contrast  05/27/2012  *RADIOLOGY REPORT*  Clinical Data: Status post fall.  Evaluate humeral fracture.  CT OF THE RIGHT SHOULDER WITHOUT CONTRAST  Technique:  Multidetector CT imaging was performed according to the standard protocol. Multiplanar CT image reconstructions were also generated.  Comparison: Radiographs same date.  Findings: There is an extensively comminuted and displaced fracture of the proximal right humerus.  The humeral head is posteriorly and inferiorly subluxed with respect to the glenoid.  There is an extensively comminuted fracture involving the tuberosities. Transverse fracture through the humeral neck demonstrates marked anterior medial displacement, lodged along the anterior glenoid rim.  No glenoid or other scapular fracture is demonstrated.  The right clavicle and acromioclavicular joint are intact.  There is moderate sized shoulder joint effusion.  There is diffuse soft tissue edema/hemorrhage within the right axilla and deltoid musculature.  IMPRESSION: Extensively comminuted and displaced fracture of the proximal right humerus as described.  The humeral head is posteriorly and inferiorly subluxed with respect to the glenoid.  Original Report Authenticated By: Gerrianne Scale, M.D.   Dg Pelvis Portable  05/26/2012  *RADIOLOGY  REPORT*  Clinical Data: Fall, pelvic pain  PORTABLE PELVIS  Comparison: None.  Findings: There is no evidence of fracture or dislocation.  There is no evidence of arthropathy or other focal bony abnormality. Soft tissues are unremarkable.  IMPRESSION: Negative exam.  Original Report Authenticated By: Elsie Stain, M.D.   Dg Chest Port 1 View  05/26/2012  *RADIOLOGY REPORT*  Clinical Data: Fall, pain  PORTABLE CHEST - 1 VIEW  Comparison: None.  Findings: Cardiomegaly.  Left mastectomy and axillary node dissection.  Clear lung fields. No effusion or pneumothorax. Right shoulder fracture, incompletely evaluated. Apparent overriding  fracture fragments. Consider upper extremity radiographs for further evaluation.  IMPRESSION: Cardiomegaly without active infiltrates, rib fracture, or pneumothorax.  Right shoulder fracture, incompletely evaluated.  Original Report Authenticated By: Elsie Stain, M.D.   Scheduled Meds:   . acetaminophen  650 mg Oral Once  . amLODipine  5 mg Oral Daily  . diphenhydrAMINE  25 mg Oral Once  . docusate sodium  100 mg Oral BID  . lisinopril  20 mg Oral Daily   And  . hydrochlorothiazide  25 mg Oral Daily  . insulin aspart  0-15 Units Subcutaneous TID WC  . levofloxacin  500 mg Oral Q24H  . levothyroxine  88 mcg Oral QAC breakfast  . metoprolol tartrate  50 mg Oral BID  . senna  1 tablet Oral BID  . simvastatin  20 mg Oral q1800  . DISCONTD: levofloxacin (LEVAQUIN) IV  500 mg Intravenous Q24H   Continuous Infusions:   . DISCONTD: 0.45 % NaCl with KCl 20 mEq / L 75 mL/hr at 05/29/12 1033    Principal Problem:  *Closed fracture of right proximal humerus Active Problems:  Tachycardia  Hyperglycemia  Postoperative anemia due to acute blood loss

## 2012-05-30 NOTE — Progress Notes (Signed)
Called [facility] and gave report to Brion Aliment, LPN.  Juliane Lack, RN

## 2012-05-30 NOTE — Progress Notes (Signed)
CSW spoke with patient's daughter-in-law and she reported that the patient insurance is not in-network with [facility]. Patient and patient's daughter-in-law would like to accept the bed at [facility]. Janie at [facility] confirmed they have a bed available today.   Sabino Niemann, MSW, [employer] [phone]

## 2012-05-30 NOTE — Progress Notes (Signed)
Patient successfully voided independently this AM. Kenitra Leventhal, [name]

## 2012-05-30 NOTE — Progress Notes (Signed)
Physical Therapy Treatment Patient Details Name: Michaela Rhodes MRN: [id number] DOB: Nov 06, 1936 Today's Date: 05/30/2012 Time: 4098-1191 PT Time Calculation (min): 20 min  PT Assessment / Plan / Recommendation Comments on Treatment Session  Pt mobility imprvoving.  Plan to D/c to snf today.     Follow Up Recommendations  Skilled nursing facility;Supervision/Assistance - 24 hour    Barriers to Discharge        Equipment Recommendations  Defer to next venue    Recommendations for Other Services    Frequency Min 3X/week   Plan Discharge plan remains appropriate;Frequency remains appropriate    Precautions / Restrictions Precautions Precautions: Fall;Shoulder Precaution Booklet Issued: No Required Braces or Orthoses: Other Brace/Splint Other Brace/Splint: Right Shoulder immobilizer.  Restrictions Weight Bearing Restrictions: Yes RUE Weight Bearing: Non weight bearing   Pertinent Vitals/Pain Pt reports pain 2/10. Initially pt stated 8/10, changed to 2/10 after PT explained the pain scale.   O2 sats dropped for mid 90s on room air at rest to mid 80s on room air while ambulating.  HR as high as 135 while ambulating.       Mobility  Bed Mobility Bed Mobility: Not assessed Transfers Transfers: Sit to Stand;Stand to Sit Sit to Stand: 4: Min assist;From chair/3-in-1;With upper extremity assist Sit to Stand: Patient Percentage: 80% Stand to Sit: To chair/3-in-1;With upper extremity assist;4: Min assist Details for Transfer Assistance: Min assist for LE and trunk extension to stand due to LE weakness.  Cues for hand placement for both standing and sitting.   Ambulation/Gait Ambulation/Gait Assistance: 4: Min assist Ambulation Distance (Feet): 60 Feet Assistive device: Lofstrands Ambulation/Gait Assistance Details: repeated cueing for sequencing.  Pt had difficutly remembering.  Pt unsteady and required intermittent assist for balance.   Gait Pattern: Step-to pattern;Decreased  stride length;Decreased dorsiflexion - left;Right flexed knee in stance;Left flexed knee in stance;Trunk flexed Gait velocity: decreased General Gait Details: Pt able to advance L LE better today, pt reports feeling more secure with loftstrand crutch.   Stairs: No Wheelchair Mobility Wheelchair Mobility: No    Exercises Shoulder Exercises Elbow Flexion: AAROM;Right;10 reps;Seated Elbow Extension: AAROM;10 reps;Seated;Right Wrist Flexion: AROM;Right;10 reps Wrist Extension: AROM;10 reps;Seated Digit Composite Flexion: Right;AROM;10 reps;Seated Composite Extension: AROM;Right;10 reps;Seated Neck Flexion: AROM;10 reps;Seated Neck Extension: AROM;10 reps;Seated Neck Lateral Flexion - Right: AROM;10 reps;Seated Neck Lateral Flexion - Left: AROM;10 reps;Seated   PT Diagnosis:    PT Problem List:   PT Treatment Interventions:     PT Goals Acute Rehab PT Goals PT Goal Formulation: With patient Time For Goal Achievement: 06/11/12 Potential to Achieve Goals: Fair Pt will go Supine/Side to Sit: with modified independence Pt will go Sit to Stand: with modified independence;with upper extremity assist PT Goal: Sit to Stand - Progress: Progressing toward goal Pt will go Stand to Sit: with modified independence PT Goal: Stand to Sit - Progress: Progressing toward goal Pt will Ambulate: 51 - 150 feet;with modified independence;with least restrictive assistive device PT Goal: Ambulate - Progress: Progressing toward goal  Visit Information  Last PT Received On: 05/30/12 Assistance Needed: +2    Subjective Data      Cognition  Overall Cognitive Status: Appears within functional limits for tasks assessed/performed Arousal/Alertness: Awake/alert Orientation Level: Oriented X4 / Intact Behavior During Session: [facility] for tasks performed    Balance  Balance Balance Assessed: No  End of Session PT - End of Session Equipment Utilized During Treatment: Gait belt Activity Tolerance: Patient  tolerated treatment well Patient left: in chair;with call  bell/phone within reach Nurse Communication: Mobility status    Michaela Rhodes 05/30/2012, 12:53 PM

## 2012-05-30 NOTE — Progress Notes (Signed)
Clinical social worker assisted with patient discharge to skilled nursing facility, [facility].  CSW addressed all family questions and concerns. CSW copied chart and added all important documents. CSW also set up patient transportation with [facility]. Clinical Social Worker will sign off for now as social work intervention is no longer needed.   Taniqua Issa, MSW, LCSWA [phone] 

## 2012-05-30 NOTE — Discharge Summary (Signed)
Physician Discharge Summary  Patient ID: Michaela Rhodes MRN: [id number] DOB/AGE: February 22, 1936 76 y.o.  Admit date: 05/26/2012 Discharge date: 05/30/2012  Admission Diagnoses:  Closed fracture of right proximal humerus  Discharge Diagnoses:  Principal Problem:  *Closed fracture of right proximal humerus Active Problems:  Tachycardia  Hyperglycemia  Postoperative anemia due to acute blood loss   Past Medical History  Diagnosis Date  . Hypertension   . Hypothyroidism   . Panic attacks     mild  . SVT (supraventricular tachycardia)     in the past  . DM2 (diabetes mellitus, type 2)     Surgeries: Procedure(s): TOTAL SHOULDER ARTHROPLASTY on 05/26/2012 - 05/27/2012   Consultants (if any): Treatment Team:  Joanna Hews, MD  Discharged Condition: Improved  Hospital Course: Michaela Rhodes is an 76 y.o. female who was admitted 05/26/2012 with a diagnosis of Closed fracture of right proximal humerus and went to the operating room on 05/26/2012 - 05/27/2012 and underwent the above named procedures.    She was given perioperative antibiotics:  Anti-infectives     Start     Dose/Rate Route Frequency Ordered Stop   05/30/12 0000   levofloxacin (LEVAQUIN) tablet 500 mg        500 mg Oral Every 24 hours 05/29/12 1220 06/01/12 2359   05/27/12 1630   ceFAZolin (ANCEF) IVPB 2 g/50 mL premix        2 g 100 mL/hr over 30 Minutes Intravenous Every 6 hours 05/27/12 1541 05/28/12 0530   05/27/12 0130   levofloxacin (LEVAQUIN) IVPB 500 mg  Status:  Discontinued        500 mg 100 mL/hr over 60 Minutes Intravenous Every 24 hours 05/27/12 0120 05/29/12 1220        .  She was given sequential compression devices, early ambulation, and scds for DVT prophylaxis.  She benefited maximally from the hospital stay and there were no complications.  She did have ABLA and elected not to have transfusions after extensive discussions with patient and daughter who is an ED MD.  Recent vital signs:  Filed  Vitals:   05/30/12 0502  BP: 136/55  Pulse: 74  Temp: 98.2 F (36.8 C)  Resp: 18    Recent laboratory studies:  Lab Results  Component Value Date   HGB 7.4* 05/30/2012   HGB 7.5* 05/29/2012   HGB 8.3* 05/28/2012   Lab Results  Component Value Date   WBC 9.4 05/30/2012   PLT 209 05/30/2012   No results found for this basename: INR   Lab Results  Component Value Date   NA 136 05/30/2012   K 3.7 05/30/2012   CL 103 05/30/2012   CO2 24 05/30/2012   BUN 28* 05/30/2012   CREATININE 0.94 05/30/2012   GLUCOSE 149* 05/30/2012    Discharge Medications:   Medication List  As of 05/30/2012  8:49 AM   TAKE these medications         amLODipine 5 MG tablet   Commonly known as: NORVASC   Take 5 mg by mouth daily.      calcium-vitamin D 500-200 MG-UNIT per tablet   Commonly known as: OSCAL WITH D   Take 1 tablet by mouth daily.      DSS 100 MG Caps   Take 100 mg by mouth 2 (two) times daily.      glimepiride 1 MG tablet   Commonly known as: AMARYL   Take 1 mg by mouth daily before breakfast.  levothyroxine 100 MCG tablet   Commonly known as: SYNTHROID, LEVOTHROID   Take 100 mcg by mouth daily.      lisinopril-hydrochlorothiazide 20-25 MG per tablet   Commonly known as: PRINZIDE,ZESTORETIC   Take 1 tablet by mouth daily.      metFORMIN 500 MG tablet   Commonly known as: GLUCOPHAGE   Take 1,000 mg by mouth 2 (two) times daily with a meal.      methocarbamol 500 MG tablet   Commonly known as: ROBAXIN   Take 1 tablet (500 mg total) by mouth 4 (four) times daily.      metoprolol 50 MG tablet   Commonly known as: LOPRESSOR   Take 50 mg by mouth 2 (two) times daily.      oxyCODONE-acetaminophen 10-325 MG per tablet   Commonly known as: PERCOCET   Take 1-2 tablets by mouth every 6 (six) hours as needed for pain. MAXIMUM TOTAL ACETAMINOPHEN DOSE IS 4000 MG PER DAY      pravastatin 40 MG tablet   Commonly known as: PRAVACHOL   Take 40 mg by mouth daily.      senna 8.6 MG  Tabs   Commonly known as: SENOKOT   Take 1 tablet (8.6 mg total) by mouth 2 (two) times daily.            Diagnostic Studies: Dg Ribs Unilateral W/chest Right  05/26/2012  *RADIOLOGY REPORT*  Clinical Data: Fall, chest pain  RIGHT RIBS AND CHEST - 3+ VIEW  Comparison: Chest radiographs dated 05/26/2012  Findings: No right rib fracture is seen.  Displaced right proximal humerus fracture.  Visualized right lung is clear.  IMPRESSION: No right rib fracture is seen.  Displaced right proximal humerus fracture.  Original Report Authenticated By: Charline Bills, M.D.   Dg Shoulder Right  05/26/2012  *RADIOLOGY REPORT*  Clinical Data: Fall down stairs, severe right shoulder fracture  RIGHT SHOULDER - 2+ VIEW  Comparison: None.  Findings: Evaluation is constrained due to difficulty with patient positioning.  Suspected three-part proximal humeral fracture.  Additional mild comminution.  Dominant component is a displaced femoral neck fracture.  Distal fracture fragments are inferiorly/medially displaced.  IMPRESSION: Evaluation is constrained due to difficulty with patient positioning.  Suspected three-part proximal humeral fracture.  Dominant component is a displaced femoral neck fracture, as above.  Original Report Authenticated By: Charline Bills, M.D.   Dg Wrist Complete Left  05/26/2012  *RADIOLOGY REPORT*  Clinical Data: Fall down stairs, wrist pain  LEFT WRIST - COMPLETE 3+ VIEW  Comparison: None.  Findings: Mild deformity of the distal radius, favored to be chronic.  Otherwise, no evidence of fracture or dislocation.  Degenerative changes.  The visualized soft tissues are unremarkable.  IMPRESSION: Mild deformity of the distal radius, favored to be chronic. Correlate with the site of the patient's pain.  Otherwise, no evidence of fracture or dislocation.  Original Report Authenticated By: Charline Bills, M.D.   Dg Tibia/fibula Right  05/26/2012  *RADIOLOGY REPORT*  Clinical Data: Fall, anterior  midshaft tibial pain  RIGHT TIBIA AND FIBULA - 2 VIEW  Comparison: None.  Findings: No fracture or dislocation is seen.  The joint spaces are preserved.  Mild soft tissue swelling overlying the anterior proximal tibia.  IMPRESSION: No fracture or dislocation is seen.  Mild soft tissue swelling.  Original Report Authenticated By: Charline Bills, M.D.   Ct Head Wo Contrast  05/26/2012  *RADIOLOGY REPORT*  Clinical Data:  Fall, head/neck pain  CT HEAD WITHOUT CONTRAST  CT CERVICAL SPINE WITHOUT CONTRAST  Technique:  Multidetector CT imaging of the head and cervical spine was performed following the standard protocol without intravenous contrast.  Multiplanar CT image reconstructions of the cervical spine were also generated.  Comparison:  None.  CT HEAD  Findings: No evidence of parenchymal hemorrhage or extra-axial fluid collection. No mass lesion, mass effect, or midline shift.  No CT evidence of acute infarction.  Mild intracranial atherosclerosis.  Cerebral volume is age appropriate.  No ventriculomegaly.  The visualized paranasal sinuses are essentially clear. The mastoid air cells are unopacified.  Large extracranial hematoma overlying the right frontotemporal region (series 3/image 21).  No evidence of calvarial fracture.  IMPRESSION: Large extracranial hematoma overlying the right frontotemporal region.  No evidence of calvarial fracture.  No evidence of acute intracranial abnormality.  CT CERVICAL SPINE  Findings: Normal cervical lordosis.  No evidence of fracture or dislocation.  Vertebral body heights are maintained.  The dens appears intact.  No prevertebral soft tissue swelling.  Mild multilevel degenerative changes.  Visualized thyroid is unremarkable.  Visualized lung apices are clear.  IMPRESSION: No evidence of traumatic injury to the cervical spine.  Mild multilevel degenerative changes.  Original Report Authenticated By: Charline Bills, M.D.   Ct Cervical Spine Wo Contrast  05/26/2012   *RADIOLOGY REPORT*  Clinical Data:  Fall, head/neck pain  CT HEAD WITHOUT CONTRAST CT CERVICAL SPINE WITHOUT CONTRAST  Technique:  Multidetector CT imaging of the head and cervical spine was performed following the standard protocol without intravenous contrast.  Multiplanar CT image reconstructions of the cervical spine were also generated.  Comparison:  None.  CT HEAD  Findings: No evidence of parenchymal hemorrhage or extra-axial fluid collection. No mass lesion, mass effect, or midline shift.  No CT evidence of acute infarction.  Mild intracranial atherosclerosis.  Cerebral volume is age appropriate.  No ventriculomegaly.  The visualized paranasal sinuses are essentially clear. The mastoid air cells are unopacified.  Large extracranial hematoma overlying the right frontotemporal region (series 3/image 21).  No evidence of calvarial fracture.  IMPRESSION: Large extracranial hematoma overlying the right frontotemporal region.  No evidence of calvarial fracture.  No evidence of acute intracranial abnormality.  CT CERVICAL SPINE  Findings: Normal cervical lordosis.  No evidence of fracture or dislocation.  Vertebral body heights are maintained.  The dens appears intact.  No prevertebral soft tissue swelling.  Mild multilevel degenerative changes.  Visualized thyroid is unremarkable.  Visualized lung apices are clear.  IMPRESSION: No evidence of traumatic injury to the cervical spine.  Mild multilevel degenerative changes.  Original Report Authenticated By: Charline Bills, M.D.   Ct Abdomen Pelvis W Contrast  05/26/2012  *RADIOLOGY REPORT*  Clinical Data: Fall, abdominal pain, prior appendectomy, cholecystectomy, hysterectomy, and mastectomy  CT ABDOMEN AND PELVIS WITH CONTRAST  Technique:  Multidetector CT imaging of the abdomen and pelvis was performed following the standard protocol during bolus administration of intravenous contrast.  Contrast: OMNIPAQUE IOHEXOL 300 MG/ML  SOLN  Comparison: None.   Findings: Mild dependent atelectasis at the lung bases.  Left mastectomy with breast reconstruction.  Soft tissue hematoma along the right lateral chest wall (series 2/image 1).  Small hiatal hernia.  Multiple hepatic cysts.  Spleen, pancreas, and adrenal glands are within normal limits.  Status post cholecystectomy.  Mild intrahepatic/extrahepatic ductal dilatation, likely postsurgical.  Common duct measures 11 mm.  Kidneys are notable for bilateral renal sinus cysts.  No hydronephrosis.  No evidence of bowel obstruction.  Prior appendectomy.  Colonic diverticulosis, without associated inflammatory changes.  No evidence of abdominal aortic aneurysm.  No abdominopelvic ascites.  No hemoperitoneum.  No free air.  No suspicious abdominopelvic lymphadenopathy.  Status post hysterectomy.  Bladder is within normal limits.  Degenerative changes of the visualized thoracolumbar spine.  Mild to moderate superior endplate compression deformity at L1, chronic.  IMPRESSION: No evidence of traumatic injury to the abdomen/pelvis.  Soft tissue hematoma along the right lateral chest wall, likely extending inferiorly from the right shoulder.  Additional ancillary findings as above.  Original Report Authenticated By: Charline Bills, M.D.   Ct Shoulder Right Wo Contrast  05/27/2012  *RADIOLOGY REPORT*  Clinical Data: Status post fall.  Evaluate humeral fracture.  CT OF THE RIGHT SHOULDER WITHOUT CONTRAST  Technique:  Multidetector CT imaging was performed according to the standard protocol. Multiplanar CT image reconstructions were also generated.  Comparison: Radiographs same date.  Findings: There is an extensively comminuted and displaced fracture of the proximal right humerus.  The humeral head is posteriorly and inferiorly subluxed with respect to the glenoid.  There is an extensively comminuted fracture involving the tuberosities. Transverse fracture through the humeral neck demonstrates marked anterior medial displacement,  lodged along the anterior glenoid rim.  No glenoid or other scapular fracture is demonstrated.  The right clavicle and acromioclavicular joint are intact.  There is moderate sized shoulder joint effusion.  There is diffuse soft tissue edema/hemorrhage within the right axilla and deltoid musculature.  IMPRESSION: Extensively comminuted and displaced fracture of the proximal right humerus as described.  The humeral head is posteriorly and inferiorly subluxed with respect to the glenoid.  Original Report Authenticated By: Gerrianne Scale, M.D.   Dg Pelvis Portable  05/26/2012  *RADIOLOGY REPORT*  Clinical Data: Fall, pelvic pain  PORTABLE PELVIS  Comparison: None.  Findings: There is no evidence of fracture or dislocation.  There is no evidence of arthropathy or other focal bony abnormality. Soft tissues are unremarkable.  IMPRESSION: Negative exam.  Original Report Authenticated By: Elsie Stain, M.D.   Dg Chest Port 1 View  05/26/2012  *RADIOLOGY REPORT*  Clinical Data: Fall, pain  PORTABLE CHEST - 1 VIEW  Comparison: None.  Findings: Cardiomegaly.  Left mastectomy and axillary node dissection.  Clear lung fields. No effusion or pneumothorax. Right shoulder fracture, incompletely evaluated. Apparent overriding fracture fragments. Consider upper extremity radiographs for further evaluation.  IMPRESSION: Cardiomegaly without active infiltrates, rib fracture, or pneumothorax.  Right shoulder fracture, incompletely evaluated.  Original Report Authenticated By: Elsie Stain, M.D.   Dg Shoulder Right Port  05/27/2012  *RADIOLOGY REPORT*  Clinical Data: Fracture of the proximal right humerus.  PORTABLE RIGHT SHOULDER - 2+ VIEW  Comparison: CT 05/26/2002  Findings: Two views of the right shoulder demonstrate a total shoulder arthroplasty.  The patient has low lung volumes.  The shoulder appears to be grossly located on these two views.  IMPRESSION: Right shoulder arthroplasty without gross hardware complication.   Original Report Authenticated By: Richarda Overlie, M.D.   Dg Humerus Right  05/26/2012  *RADIOLOGY REPORT*  Clinical Data: Fall down stairs, severe right shoulder fracture  RIGHT HUMERUS - 2+ VIEW  Comparison: None.  Findings: Suspected three-part proximal humerus fracture.  See report from right shoulder radiographs for further description.  IMPRESSION: Suspected three-part proximal humerus fracture.  Original Report Authenticated By: Charline Bills, M.D.    Disposition: Final discharge disposition not confirmed  Discharge Orders    Future Orders Please Complete By Expires   Diet  general      Call MD / Call 911      Comments:   If you experience chest pain or shortness of breath, CALL 911 and be transported to the hospital emergency room.  If you develope a fever above 101 F, pus (white drainage) or increased drainage or redness at the wound, or calf pain, call your surgeon's office.   Discharge instructions      Comments:   Change dressing in 3 days and reapply fresh dressing, unless you have a splint (half cast).  If you have a splint/cast, just leave in place until your follow-up appointment.    Keep wounds dry for 3 weeks.  Leave steri-strips in place on skin.  Do not apply lotion or anything to the wound.   Constipation Prevention      Comments:   Drink plenty of fluids.  Prune juice may be helpful.  You may use a stool softener, such as Colace (over the counter) 100 mg twice a day.  Use MiraLax (over the counter) for constipation as needed.      Follow-up Information    Follow up with Klair Leising P, MD in 2 weeks.   Contact information:   [facility] [address], [address] [place] [address] [phone]           Signed: [name] 05/30/2012, 8:49 AM

## 2012-05-30 NOTE — Progress Notes (Signed)
Occupational Therapy Treatment Patient Details Name: Michaela Rhodes MRN: [id number] DOB: May 11, 1936 Today's Date: 05/30/2012 Time: 4098-1191 OT Time Calculation (min): 26 min  OT Assessment / Plan / Recommendation Comments on Treatment Session Pt. anticipates D/C to SNF today    Follow Up Recommendations  Skilled nursing facility       Equipment Recommendations  Defer to next venue       Frequency Min 2X/week      Precautions / Restrictions Precautions Precautions: Fall;Shoulder Precaution Booklet Issued: No Required Braces or Orthoses: Other Brace/Splint Other Brace/Splint: Right Shoulder immobilizer.  Restrictions Weight Bearing Restrictions: Yes RUE Weight Bearing: Non weight bearing   Pertinent Vitals/Pain 5/10 shoulder    ADL  Grooming: Performed;Teeth care;Minimal assistance Where Assessed - Grooming: Supported sitting ADL Comments: Pt. educated on sling use don/doff and techniques for completing ADLs with maintaining precautions      OT Goals Acute Rehab OT Goals OT Goal Formulation: With patient Time For Goal Achievement: 06/11/12 Potential to Achieve Goals: Good ADL Goals Additional ADL Goal #1: Pt. will don/doff sling independently ADL Goal: Additional Goal #1 - Progress: Progressing toward goals Arm Goals Pt Will Perform AROM: with supervision, verbal cues required/provided;to maintain range of motion;Right upper extremity;10 reps Arm Goal: AROM - Progress: Progressing toward goal  Visit Information  Last OT Received On: 05/30/12 Assistance Needed: +2          Cognition  Overall Cognitive Status: Appears within functional limits for tasks assessed/performed Arousal/Alertness: Awake/alert Orientation Level: Oriented X4 / Intact Behavior During Session: [facility] for tasks performed       Exercises Shoulder Exercises Elbow Flexion: AAROM;Right;10 reps;Seated Elbow Extension: AAROM;10 reps;Seated;Right Wrist Flexion: AROM;Right;10 reps Wrist  Extension: AROM;10 reps;Seated Digit Composite Flexion: Right;AROM;10 reps;Seated Composite Extension: AROM;Right;10 reps;Seated Neck Flexion: AROM;10 reps;Seated Neck Extension: AROM;10 reps;Seated Neck Lateral Flexion - Right: AROM;10 reps;Seated Neck Lateral Flexion - Left: AROM;10 reps;Seated  Balance    End of Session OT - End of Session Patient left: in chair;with call bell/phone within reach Nurse Communication: Mobility status;Precautions   Cassandria Anger, OTR/L Pager [phone] 05/30/2012, 12:26 PM

## 2012-05-31 LAB — TYPE AND SCREEN
ABO/RH(D): B POS
Antibody Screen: NEGATIVE
Unit division: 0

## 2012-06-26 ENCOUNTER — Ambulatory Visit (INDEPENDENT_AMBULATORY_CARE_PROVIDER_SITE_OTHER): Payer: Medicare Other | Admitting: Cardiology

## 2012-06-26 ENCOUNTER — Encounter: Payer: Self-pay | Admitting: Cardiology

## 2012-06-26 VITALS — BP 106/73 | HR 114 | Ht 68.0 in | Wt 205.8 lb

## 2012-06-26 DIAGNOSIS — I10 Essential (primary) hypertension: Secondary | ICD-10-CM

## 2012-06-26 DIAGNOSIS — R55 Syncope and collapse: Secondary | ICD-10-CM

## 2012-06-26 DIAGNOSIS — I498 Other specified cardiac arrhythmias: Secondary | ICD-10-CM

## 2012-06-26 DIAGNOSIS — E119 Type 2 diabetes mellitus without complications: Secondary | ICD-10-CM

## 2012-06-26 NOTE — Assessment & Plan Note (Signed)
As above she will discuss this with her primary provider.

## 2012-06-26 NOTE — Patient Instructions (Addendum)
The current medical regimen is effective;  continue present plan and medications.  Follow up with Dr Hochrein in 2 months. 

## 2012-06-26 NOTE — Assessment & Plan Note (Signed)
It is not clear that the patient had a syncopal episode. Symptoms however are concerning. I will have her keep a strict blood pressure diary with frequent readings and we talked about this at length. I have asked her to go back to her primary doctors to discuss any modifications to her diabetes medication given one documented episode of hypoglycemia and an A1c is 6.0. Further evaluation of this will be based on future symptoms or her blood pressure readings area of

## 2012-06-26 NOTE — Assessment & Plan Note (Signed)
She's had no symptomatic recurrence of this. I do not think is playing a role with regard to her recent event.

## 2012-06-26 NOTE — Assessment & Plan Note (Signed)
I will follow the BP diary as above.  For now she will continue the current medications.  Of note she was not orthostatic in the office though her repeat blood pressure was somewhat low.

## 2012-06-26 NOTE — Progress Notes (Signed)
HPI The patient presents for followup of hypertension and SVT.  She was in the hospital recently with a broken right shoulder. She fell down some stairs. She thinks she might have had a syncopal episode. She doesn't recall the actual event. She wonders whether she could have had low blood sugar or low blood pressure. She does get some mild dizziness occasionally upon standing or first lying down. She doesn't feel any palpitations and has had no symptoms similar to her previous SVT. She does not get chest pressure, neck or arm discomfort. She has no shortness of breath, PND or orthopnea. She has also had one low blood sugar reported in rehabilitation. I did go back and look at her hospital records and found that her most recent hemoglobin A1c was 6.0. She does not check her blood sugars were her blood pressure at home.  Allergies  Allergen Reactions  . Penicillins Anaphylaxis and Hives    Current Outpatient Prescriptions  Medication Sig Dispense Refill  . calcium-vitamin D (OSCAL WITH D) 500-200 MG-UNIT per tablet Take 1 tablet by mouth daily.  100 tablet  2  . glimepiride (AMARYL) 1 MG tablet Take 1 mg by mouth daily before breakfast.      . levothyroxine (SYNTHROID, LEVOTHROID) 100 MCG tablet Take 100 mcg by mouth daily.      Marland Kitchen lisinopril-hydrochlorothiazide (PRINZIDE,ZESTORETIC) 20-25 MG per tablet Take 1 tablet by mouth daily.      . metFORMIN (GLUCOPHAGE) 500 MG tablet Take 1,000 mg by mouth 2 (two) times daily with a meal.      . metoprolol (LOPRESSOR) 50 MG tablet Take 50 mg by mouth 2 (two) times daily.        . pravastatin (PRAVACHOL) 40 MG tablet Take 40 mg by mouth daily.        Past Medical History  Diagnosis Date  . Hypertension   . Hypothyroidism   . Panic attacks     mild  . SVT (supraventricular tachycardia)     in the past  . DM2 (diabetes mellitus, type 2)     Past Surgical History  Procedure Date  . Appendectomy   . Cholecystectomy   . Mastectomy     Left  .  Abdominal hysterectomy     BSO as well  . Reconstruction breast w/ latissimus dorsi flap   . Orif shoulder fracture 05/27/2012    Procedure: OPEN REDUCTION INTERNAL FIXATION (ORIF) SHOULDER FRACTURE;  Surgeon: [name], MD;  Location: [facility] OR;  Service: Orthopedics;  Laterality: Right;  . Total shoulder arthroplasty 05/27/2012    Procedure: TOTAL SHOULDER ARTHROPLASTY;  Surgeon: [name], MD;  Location: [facility] OR;  Service: Orthopedics;  Laterality: Right;    ROS:  As stated in the HPI and negative for all other systems.  PHYSICAL EXAM BP 130/85  Pulse 99  Ht 5\' 8"  (1.727 m)  Wt 205 lb 12.8 oz (93.35 kg)  BMI 31.29 kg/m2 GENERAL:  Well appearing HEENT:  Pupils equal round and reactive, fundi not visualized, oral mucosa unremarkable NECK:  No jugular venous distention, waveform within normal limits, carotid upstroke brisk and symmetric, no bruits, no thyromegaly LYMPHATICS:  No cervical, inguinal adenopathy LUNGS:  Clear to auscultation bilaterally BACK:  No CVA tenderness CHEST:  Unremarkable HEART:  PMI not displaced or sustained,S1 and S2 within normal limits, no S3, no S4, no clicks, no rubs, no murmurs ABD:  Flat, positive bowel sounds normal in frequency in pitch, no bruits, no rebound, no guarding,  no midline pulsatile mass, no hepatomegaly, no splenomegaly EXT:  2 plus pulses throughout, no edema, no cyanosis no clubbing SKIN:  No rashes no nodules NEURO:  Cranial nerves II through XII grossly intact, motor grossly intact throughout [facility]:  Cognitively intact, oriented to person place and time  JXB:[id number] rhythm, rate 98, right axis deviation, first degree AV block, no acute ST-T wave changes  06/26/2012   ASSESSMENT AND PLAN

## 2012-07-12 ENCOUNTER — Other Ambulatory Visit: Payer: Self-pay | Admitting: Cardiology

## 2012-07-12 MED ORDER — LEVOTHYROXINE SODIUM 100 MCG PO TABS: 100.0000 ug | ORAL_TABLET | Freq: Every day | ORAL | Status: DC

## 2012-07-12 MED ORDER — METOPROLOL TARTRATE 50 MG PO TABS: 50.0000 mg | ORAL_TABLET | Freq: Two times a day (BID) | ORAL | Status: DC

## 2012-07-26 ENCOUNTER — Encounter: Payer: Self-pay | Admitting: Family Medicine

## 2012-07-26 ENCOUNTER — Ambulatory Visit (INDEPENDENT_AMBULATORY_CARE_PROVIDER_SITE_OTHER): Payer: Medicare Other | Admitting: Family Medicine

## 2012-07-26 VITALS — BP 140/78 | HR 80 | Temp 97.8°F | Resp 12 | Ht 65.0 in | Wt 208.0 lb

## 2012-07-26 DIAGNOSIS — I498 Other specified cardiac arrhythmias: Secondary | ICD-10-CM

## 2012-07-26 DIAGNOSIS — C50912 Malignant neoplasm of unspecified site of left female breast: Secondary | ICD-10-CM

## 2012-07-26 DIAGNOSIS — C50919 Malignant neoplasm of unspecified site of unspecified female breast: Secondary | ICD-10-CM

## 2012-07-26 DIAGNOSIS — D649 Anemia, unspecified: Secondary | ICD-10-CM

## 2012-07-26 DIAGNOSIS — E119 Type 2 diabetes mellitus without complications: Secondary | ICD-10-CM

## 2012-07-26 DIAGNOSIS — I10 Essential (primary) hypertension: Secondary | ICD-10-CM

## 2012-07-26 DIAGNOSIS — E039 Hypothyroidism, unspecified: Secondary | ICD-10-CM

## 2012-07-26 LAB — POCT HEMOGLOBIN: Hemoglobin: 12 g/dL — AB (ref 12.2–16.2)

## 2012-07-26 NOTE — Patient Instructions (Addendum)
Touch base prior to office follow up in 2 months if blood sugar is consistently climbing back over 160.

## 2012-07-26 NOTE — Progress Notes (Signed)
Subjective:    Patient ID: Michaela Rhodes, female    DOB: 1936/03/30, 76 y.o.   MRN: [id number]  HPI  New patient to establish care. Past medical history reviewed. She has remote history of left breast cancer, history of obesity, hypertension, hyperlipidemia, hypothyroidism, and type 2 diabetes. Also history of reported SVT. She had episode back in June were she had apparent syncopal episode. It was not apparent whether this was hypoglycemia induced. She had severe fracture right humerus which required right shoulder replacement. She had anemia with hemoglobin 7.4 leaving hospital. She's had good dietary intake since then and no dizziness and no recurrent syncope. Hemoglobin A1c 6.0% back in June.  Patient has not had confirmed hypoglycemia but took herself off of medications including Amaryl and metformin several weeks ago. Her blood sugars been relatively stable since then. Most fasting blood sugars low 100s. No symptoms of hyperglycemia. Patient had reported TSH back in March which was reportedly normal. She is compliant with all regular medications. No recent chest pains. No palpitations.  She's had multiple surgeries including cholecystectomy, breast reconstructive surgery, appendectomy, and hysterectomy for benign disease.  Family history significant for mother with heart disease and father with history of stroke.  Patient is a widowed. Retired Youth worker. No history of smoking or alcohol use.  Past Medical History  Diagnosis Date  . Hypertension   . Hypothyroidism   . Panic attacks     mild  . SVT (supraventricular tachycardia)     in the past  . DM2 (diabetes mellitus, type 2)    Past Surgical History  Procedure Date  . Appendectomy   . Cholecystectomy   . Mastectomy     Left  . Abdominal hysterectomy     BSO as well  . Reconstruction breast w/ latissimus dorsi flap   . Orif shoulder fracture 05/27/2012    Procedure: OPEN REDUCTION INTERNAL FIXATION (ORIF)  SHOULDER FRACTURE;  Surgeon: [name], MD;  Location: [facility] OR;  Service: Orthopedics;  Laterality: Right;  . Total shoulder arthroplasty 05/27/2012    Procedure: TOTAL SHOULDER ARTHROPLASTY;  Surgeon: [name], MD;  Location: [facility] OR;  Service: Orthopedics;  Laterality: Right;    reports that she has never smoked. She does not have any smokeless tobacco history on file. She reports that she does not drink alcohol or use illicit drugs. family history includes Coronary artery disease in an unspecified family member; Diabetes in an unspecified family member; Heart disease in her mother; Hypertension in her father, mother, sister, and unspecified family member; and Stroke in her father. Allergies  Allergen Reactions  . Penicillins Anaphylaxis and Hives      Review of Systems  Constitutional: Negative for fever, activity change, appetite change and fatigue.  HENT: Negative for hearing loss, ear pain, sore throat and trouble swallowing.   Eyes: Negative for visual disturbance.  Respiratory: Negative for cough and shortness of breath.   Cardiovascular: Negative for chest pain and palpitations.  Gastrointestinal: Negative for abdominal pain, diarrhea, constipation and blood in stool.  Genitourinary: Negative for dysuria and hematuria.  Musculoskeletal: Negative for myalgias, back pain and arthralgias.  Skin: Negative for rash.  Neurological: Negative for dizziness, syncope and headaches.  Hematological: Negative for adenopathy.  Psychiatric/Behavioral: Negative for confusion and dysphoric mood.       Objective:   Physical Exam  Constitutional: She is oriented to person, place, and time. She appears well-developed and well-nourished.  HENT:  Mouth/Throat: Oropharynx is clear and  moist.  Neck: Neck supple. No thyromegaly present.  Cardiovascular: Normal rate and regular rhythm.   Pulmonary/Chest: Effort normal and breath sounds normal. No respiratory distress. She has no wheezes.  She has no rales.  Musculoskeletal: She exhibits no edema.  Lymphadenopathy:    She has no cervical adenopathy.  Neurological: She is alert and oriented to person, place, and time.          Assessment & Plan:  #1 type 2 diabetes. Recent A1c showed good control. No prior confirmed hypoglycemia but blood sugars have been fairly stable off medications for the past few weeks. Followup in 2 months. Reassess A1c them. Continue monitoring her blood sugars in the meantime and be in touch if consistent fasting blood sugars over 140 -160 range #2 anemia post surgery. Recheck hemoglobin though clinically appears improved  #3 hypertension. Marginal control by today's reading. Work on weight loss #4 history of hypothyroidism #5 history of SVT currently appears to be sinus rhythm and rate controlled #6 history of hyperlipidemia

## 2012-08-28 ENCOUNTER — Encounter: Payer: Self-pay | Admitting: Cardiology

## 2012-08-28 ENCOUNTER — Ambulatory Visit (INDEPENDENT_AMBULATORY_CARE_PROVIDER_SITE_OTHER): Payer: Medicare Other | Admitting: Cardiology

## 2012-08-28 VITALS — BP 153/97 | HR 109 | Ht 66.0 in | Wt 207.4 lb

## 2012-08-28 DIAGNOSIS — R002 Palpitations: Secondary | ICD-10-CM

## 2012-08-28 NOTE — Patient Instructions (Addendum)
The current medical regimen is effective;  continue present plan and medications.  Please have blood work today (TSH).  Follow up in 1 year with Dr Hochrein.  You will receive a letter in the mail 2 months before you are due.  Please call us when you receive this letter to schedule your follow up appointment.  

## 2012-08-28 NOTE — Progress Notes (Signed)
HPI The patient presents for followup of hypertension and SVT.  Since I last saw her she has had no syncopal episodes. She's had no symptomatic tachypalpitations that were her heart rate going fast. She still recovering from her syncopal episode with fall and right shoulder injury. He has very limited use of her right shoulder but he is going through physical therapy. With this she denies any symptoms such as chest pressure, neck or arm discomfort. She's had no new shortness of breath, PND orthopnea.  Allergies  Allergen Reactions  . Penicillins Anaphylaxis and Hives    Current Outpatient Prescriptions  Medication Sig Dispense Refill  . calcium-vitamin D (OSCAL WITH D) 500-200 MG-UNIT per tablet Take 1 tablet by mouth daily.  100 tablet  2  . glimepiride (AMARYL) 1 MG tablet Take 1 mg by mouth daily before breakfast.      . levothyroxine (SYNTHROID, LEVOTHROID) 100 MCG tablet Take 1 tablet (100 mcg total) by mouth daily.  30 tablet  9  . lisinopril-hydrochlorothiazide (PRINZIDE,ZESTORETIC) 20-25 MG per tablet Take 1 tablet by mouth daily.      . metFORMIN (GLUCOPHAGE) 500 MG tablet Take 1,000 mg by mouth 2 (two) times daily with a meal.      . metoprolol (LOPRESSOR) 50 MG tablet Take 1 tablet (50 mg total) by mouth 2 (two) times daily.  60 tablet  9  . pravastatin (PRAVACHOL) 40 MG tablet Take 40 mg by mouth daily.        Past Medical History  Diagnosis Date  . Hypertension   . Hypothyroidism   . Panic attacks     mild  . SVT (supraventricular tachycardia)     in the past  . DM2 (diabetes mellitus, type 2)     Past Surgical History  Procedure Date  . Appendectomy   . Cholecystectomy   . Mastectomy     Left  . Abdominal hysterectomy     BSO as well  . Reconstruction breast w/ latissimus dorsi flap   . Orif shoulder fracture 05/27/2012    Procedure: OPEN REDUCTION INTERNAL FIXATION (ORIF) SHOULDER FRACTURE;  Surgeon: [name], MD;  Location: [facility];  Service:  Orthopedics;  Laterality: Right;  . Total shoulder arthroplasty 05/27/2012    Procedure: TOTAL SHOULDER ARTHROPLASTY;  Surgeon: [name], MD;  Location: [facility] OR;  Service: Orthopedics;  Laterality: Right;    ROS:  As stated in the HPI and negative for all other systems.  PHYSICAL EXAM BP 153/97  Pulse 109  Ht 5\' 6"  (1.676 m)  Wt 207 lb 6.4 oz (94.076 kg)  BMI 33.48 kg/m2 GENERAL:  Well appearing NECK:  No jugular venous distention, waveform within normal limits, carotid upstroke brisk and symmetric, no bruits, no thyromegaly LYMPHATICS:  No cervical, inguinal adenopathy LUNGS:  Clear to auscultation bilaterally BACK:  No CVA tenderness CHEST:  Unremarkable HEART:  PMI not displaced or sustained,S1 and S2 within normal limits, no S3, no S4, no clicks, no rubs, no murmurs ABD:  Flat, positive bowel sounds normal in frequency in pitch, no bruits, no rebound, no guarding, no midline pulsatile mass, no hepatomegaly, no splenomegaly EXT:  2 plus pulses throughout, no edema, no cyanosis no clubbing    ASSESSMENT AND PLAN  Syncope -  She's had no further episodes. No further evaluation is warranted.  SUPRAVENTRICULAR TACHYCARDIA -  She's had no symptomatic recurrence of this. I do not think is playing a role with regard to her recent event.  She does have a sinus tachycardia today and I note on her blood pressure diary that is usually quite elevated. I will check a TSH.  HYPERTENSION -  The BP diary of blood pressures are running with systolics in the 110 range and she tolerates this. No change in therapy is indicated.

## 2012-09-03 ENCOUNTER — Other Ambulatory Visit: Payer: Self-pay | Admitting: Cardiology

## 2012-09-03 MED ORDER — PRAVASTATIN SODIUM 40 MG PO TABS: 40.0000 mg | ORAL_TABLET | Freq: Every day | ORAL | Status: DC

## 2012-09-03 MED ORDER — METOPROLOL TARTRATE 50 MG PO TABS: 50.0000 mg | ORAL_TABLET | Freq: Two times a day (BID) | ORAL | Status: DC

## 2012-09-25 ENCOUNTER — Ambulatory Visit (INDEPENDENT_AMBULATORY_CARE_PROVIDER_SITE_OTHER): Payer: Medicare Other | Admitting: Family Medicine

## 2012-09-25 ENCOUNTER — Encounter: Payer: Self-pay | Admitting: Family Medicine

## 2012-09-25 VITALS — BP 150/100 | Temp 97.5°F | Wt 212.0 lb

## 2012-09-25 DIAGNOSIS — Z23 Encounter for immunization: Secondary | ICD-10-CM

## 2012-09-25 DIAGNOSIS — E119 Type 2 diabetes mellitus without complications: Secondary | ICD-10-CM

## 2012-09-25 DIAGNOSIS — E785 Hyperlipidemia, unspecified: Secondary | ICD-10-CM | POA: Insufficient documentation

## 2012-09-25 DIAGNOSIS — I10 Essential (primary) hypertension: Secondary | ICD-10-CM

## 2012-09-25 LAB — LIPID PANEL
Cholesterol: 161 mg/dL (ref 0–200)
HDL: 41.4 mg/dL (ref >39.00)
LDL Cholesterol: 89 mg/dL (ref 0–99)
Total CHOL/HDL Ratio: 4
Triglycerides: 151 mg/dL — ABNORMAL HIGH (ref 0.0–149.0)
VLDL: 30.2 mg/dL (ref 0.0–40.0)

## 2012-09-25 LAB — HEMOGLOBIN A1C: Hgb A1c MFr Bld: 8.1 % — ABNORMAL HIGH (ref 4.6–6.5)

## 2012-09-25 LAB — BASIC METABOLIC PANEL
BUN: 27 mg/dL — ABNORMAL HIGH (ref 6–23)
Chloride: 103 mEq/L (ref 96–112)
GFR: 48.5 mL/min — ABNORMAL LOW (ref >60.00)
Potassium: 4.5 mEq/L (ref 3.5–5.1)
Sodium: 140 mEq/L (ref 135–145)

## 2012-09-25 LAB — HEPATIC FUNCTION PANEL
Albumin: 3.6 g/dL (ref 3.5–5.2)
Alkaline Phosphatase: 105 U/L (ref 39–117)
Total Protein: 6.9 g/dL (ref 6.0–8.3)

## 2012-09-25 NOTE — Progress Notes (Signed)
  Subjective:    Patient ID: Michaela Rhodes, female    DOB: 04/25/1936, 76 y.o.   MRN: [id number]  HPI  Medical followup. Patient has history of supraventricular tachycardia, type 2 diabetes, hypertension, breast cancer, and hypothyroidism. She had recent TSH which was normal. Refer to prior note. She stopped taking metformin and Amaryl a couple months ago. Blood sugars range generally between 100- 150 fasting. We planned repeat A1c today. She takes pravastatin for hyperlipidemia. No recent lipids. She does not have any history of CAD. Cardiac symptoms stable. No recent chest pains or dizziness. No recurrent syncope.  Patient not had flu vaccine yet. No history of Pneumovax. DEXA scan less than 2 years ago normal  Past Medical History  Diagnosis Date  . Hypertension   . Hypothyroidism   . Panic attacks     mild  . SVT (supraventricular tachycardia)     in the past  . DM2 (diabetes mellitus, type 2)    Past Surgical History  Procedure Date  . Appendectomy   . Cholecystectomy   . Mastectomy     Left  . Abdominal hysterectomy     BSO as well  . Reconstruction breast w/ latissimus dorsi flap   . Orif shoulder fracture 05/27/2012    Procedure: OPEN REDUCTION INTERNAL FIXATION (ORIF) SHOULDER FRACTURE;  Surgeon: [name], MD;  Location: [facility];  Service: Orthopedics;  Laterality: Right;  . Total shoulder arthroplasty 05/27/2012    Procedure: TOTAL SHOULDER ARTHROPLASTY;  Surgeon: [name], MD;  Location: [facility];  Service: Orthopedics;  Laterality: Right;    reports that she has never smoked. She does not have any smokeless tobacco history on file. She reports that she does not drink alcohol or use illicit drugs. family history includes Coronary artery disease in an unspecified family member; Diabetes in an unspecified family member; Heart disease in her mother; Hypertension in her father, mother, sister, and unspecified family member; and Stroke in her father. Allergies    Allergen Reactions  . Penicillins Anaphylaxis and Hives     Review of Systems  Constitutional: Negative for chills, appetite change, fatigue and unexpected weight change.  Eyes: Negative for visual disturbance.  Respiratory: Negative for cough, chest tightness, shortness of breath and wheezing.   Cardiovascular: Negative for chest pain, palpitations and leg swelling.  Genitourinary: Negative for dysuria.  Neurological: Negative for dizziness, seizures, syncope, weakness, light-headedness and headaches.  Hematological: Negative for adenopathy.       Objective:   Physical Exam  Constitutional: She is oriented to person, place, and time. She appears well-developed and well-nourished.  HENT:  Mouth/Throat: Oropharynx is clear and moist.  Neck: Neck supple. No thyromegaly present.  Cardiovascular: Normal rate and regular rhythm.   Pulmonary/Chest: Effort normal and breath sounds normal. No respiratory distress. She has no wheezes. She has no rales.  Musculoskeletal: She exhibits no edema.  Neurological: She is alert and oriented to person, place, and time.  Skin: No rash noted.          Assessment & Plan:  #1 type 2 diabetes. Currently off medications. Recheck A1c. She's had previous diarrhea with metformin. Consider reinitiating Amaryl if A1c is climbing back up #2 hyperlipidemia. Check lipid and hepatic panel  #3 hypothyroidism with recent TSH at goal  #4 health maintenance. Flu vaccine given. Pneumovax given. DEXA less than 2 years ago. Continue yearly mammogram

## 2012-09-25 NOTE — Patient Instructions (Addendum)
Consider bringing your blood pressure cuff next visit to compare with ours Continue to monitor blood pressure closely and be in touch if blood pressure readings consistently greater than 140/90

## 2012-09-27 ENCOUNTER — Other Ambulatory Visit: Payer: Self-pay | Admitting: *Deleted

## 2012-09-27 MED ORDER — GLIMEPIRIDE 1 MG PO TABS: 1.0000 mg | ORAL_TABLET | Freq: Every day | ORAL | Status: DC

## 2012-09-27 NOTE — Progress Notes (Signed)
Quick Note:  Attempt to call- VM - LMTCB to discuss labs and med change ______

## 2012-09-27 NOTE — Progress Notes (Signed)
Quick Note:  Pt informed ______ 

## 2012-10-04 LAB — HM DIABETES EYE EXAM

## 2012-10-05 ENCOUNTER — Encounter: Payer: Self-pay | Admitting: Family Medicine

## 2012-11-05 ENCOUNTER — Other Ambulatory Visit: Payer: Self-pay

## 2012-11-05 MED ORDER — LISINOPRIL-HYDROCHLOROTHIAZIDE 20-25 MG PO TABS: 1.0000 | ORAL_TABLET | Freq: Every day | ORAL | Status: DC

## 2012-11-05 NOTE — Telephone Encounter (Signed)
..   Requested Prescriptions   Signed Prescriptions Disp Refills  . lisinopril-hydrochlorothiazide (PRINZIDE,ZESTORETIC) 20-25 MG per tablet 30 tablet 6    Sig: Take 1 tablet by mouth daily.    Authorizing Provider: Rollene Rotunda    Ordering User: Christella Hartigan, Lashane Whelpley Judie Petit

## 2012-12-26 ENCOUNTER — Ambulatory Visit: Payer: Medicare Other | Admitting: Family Medicine

## 2013-02-21 ENCOUNTER — Ambulatory Visit (INDEPENDENT_AMBULATORY_CARE_PROVIDER_SITE_OTHER): Payer: Medicare Other | Admitting: Family Medicine

## 2013-02-21 ENCOUNTER — Encounter: Payer: Self-pay | Admitting: Family Medicine

## 2013-02-21 VITALS — BP 124/80 | HR 100 | Temp 97.8°F | Wt 225.0 lb

## 2013-02-21 DIAGNOSIS — I10 Essential (primary) hypertension: Secondary | ICD-10-CM

## 2013-02-21 LAB — HEMOGLOBIN A1C: Hgb A1c MFr Bld: 8.2 % — ABNORMAL HIGH (ref 4.6–6.5)

## 2013-02-21 NOTE — Progress Notes (Signed)
  Subjective:    Patient ID: Michaela Rhodes, female    DOB: September 05, 1936, 77 y.o.   MRN: [id number]  HPI Medical followup.  Type 2 diabetes. Last A1c 8.0%. She takes metformin 500 mg twice daily and unable to titrate because of diarrhea We started glimepiride 1 mg daily last visit. Fasting blood sugars averaging around 120 No hypoglycemia. Gets eye exams every September. She has early cataracts  Hypertension treated with lisinopril HCTZ and metoprolol. Blood pressures have generally been well controlled. She continues in physical therapy regarding right shoulder injury.  Hyperlipidemia and lipids were at goal when checked last fall. Hypothyroidism treated with levothyroxine. Recent TSH normal. Compliant with all medications.  Past Medical History  Diagnosis Date  . Hypertension   . Hypothyroidism   . Panic attacks     mild  . SVT (supraventricular tachycardia)     in the past  . DM2 (diabetes mellitus, type 2)    Past Surgical History  Procedure Laterality Date  . Appendectomy    . Cholecystectomy    . Mastectomy      Left  . Abdominal hysterectomy      BSO as well  . Reconstruction breast w/ latissimus dorsi flap    . Orif shoulder fracture  05/27/2012    Procedure: OPEN REDUCTION INTERNAL FIXATION (ORIF) SHOULDER FRACTURE;  Surgeon: [name], MD;  Location: [facility];  Service: Orthopedics;  Laterality: Right;  . Total shoulder arthroplasty  05/27/2012    Procedure: TOTAL SHOULDER ARTHROPLASTY;  Surgeon: [name], MD;  Location: [facility];  Service: Orthopedics;  Laterality: Right;    reports that she has never smoked. She does not have any smokeless tobacco history on file. She reports that she does not drink alcohol or use illicit drugs. family history includes Coronary artery disease in an unspecified family member; Diabetes in an unspecified family member; Heart disease in her mother; Hypertension in her father, mother, sister, and unspecified family member; and  Stroke in her father. Allergies  Allergen Reactions  . Penicillins Anaphylaxis and Hives      Review of Systems  Constitutional: Negative for fatigue and unexpected weight change.  Eyes: Negative for visual disturbance.  Respiratory: Negative for cough, chest tightness, shortness of breath and wheezing.   Cardiovascular: Negative for chest pain, palpitations and leg swelling.  Endocrine: Negative for polyuria.  Neurological: Negative for dizziness, seizures, syncope, weakness, light-headedness and headaches.       Objective:   Physical Exam  Constitutional: She appears well-developed and well-nourished.  Neck: Neck supple. No thyromegaly present.  Cardiovascular: Normal rate and regular rhythm.   Pulmonary/Chest: Effort normal and breath sounds normal. No respiratory distress. She has no wheezes. She has no rales.  Musculoskeletal: She exhibits no edema.          Assessment & Plan:  #1 type 2 diabetes. Recent suboptimal control. Try to lose some weight. Recheck A1c. Titrate Amaryl if necessary #2 hypertension. Stable at goal. Continue current medications #3 hypothyroidism. Recent TSH normal #4 hyperlipidemia. Continue pravastatin. Recent lipids at goal

## 2013-02-21 NOTE — Progress Notes (Signed)
Quick Note:  Pt informed on VM ______ 

## 2013-02-21 NOTE — Patient Instructions (Signed)
I want you to lose some weight Continue to monitor blood pressure. We will call you with lab results.

## 2013-05-24 ENCOUNTER — Ambulatory Visit: Payer: Medicare Other | Admitting: Family Medicine

## 2013-07-08 ENCOUNTER — Telehealth: Payer: Self-pay | Admitting: Family Medicine

## 2013-07-08 MED ORDER — GLIMEPIRIDE 1 MG PO TABS: 1.0000 mg | ORAL_TABLET | Freq: Every day | ORAL | Status: DC
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
## 2013-07-08 NOTE — Telephone Encounter (Signed)
Refill for 6 months. 

## 2013-07-08 NOTE — Telephone Encounter (Signed)
Please advise 

## 2013-07-08 NOTE — Telephone Encounter (Signed)
Refill sent to pharmacy.   

## 2013-07-09 ENCOUNTER — Other Ambulatory Visit: Payer: Self-pay | Admitting: *Deleted

## 2013-07-09 MED ORDER — METOPROLOL TARTRATE 50 MG PO TABS: 50.0000 mg | ORAL_TABLET | Freq: Two times a day (BID) | ORAL | Status: DC

## 2013-07-09 MED ORDER — PRAVASTATIN SODIUM 40 MG PO TABS: 40.0000 mg | ORAL_TABLET | Freq: Every day | ORAL | Status: DC

## 2013-07-09 MED ORDER — LISINOPRIL-HYDROCHLOROTHIAZIDE 20-25 MG PO TABS: 1.0000 | ORAL_TABLET | Freq: Every day | ORAL | Status: DC

## 2013-08-27 ENCOUNTER — Ambulatory Visit: Payer: Medicare Other | Admitting: Family Medicine

## 2013-08-30 ENCOUNTER — Ambulatory Visit (INDEPENDENT_AMBULATORY_CARE_PROVIDER_SITE_OTHER): Payer: Medicare Other | Admitting: Family Medicine

## 2013-08-30 ENCOUNTER — Encounter: Payer: Self-pay | Admitting: Family Medicine

## 2013-08-30 VITALS — BP 124/82 | HR 111 | Temp 98.6°F | Wt 224.0 lb

## 2013-08-30 DIAGNOSIS — Z23 Encounter for immunization: Secondary | ICD-10-CM

## 2013-08-30 DIAGNOSIS — E119 Type 2 diabetes mellitus without complications: Secondary | ICD-10-CM

## 2013-08-30 DIAGNOSIS — E785 Hyperlipidemia, unspecified: Secondary | ICD-10-CM

## 2013-08-30 DIAGNOSIS — I10 Essential (primary) hypertension: Secondary | ICD-10-CM

## 2013-08-30 DIAGNOSIS — E669 Obesity, unspecified: Secondary | ICD-10-CM | POA: Insufficient documentation

## 2013-08-30 DIAGNOSIS — E039 Hypothyroidism, unspecified: Secondary | ICD-10-CM

## 2013-08-30 LAB — HEPATIC FUNCTION PANEL
AST: 22 U/L (ref 0–37)
Alkaline Phosphatase: 128 U/L — ABNORMAL HIGH (ref 39–117)
Bilirubin, Direct: 0.1 mg/dL (ref 0.0–0.3)
Total Protein: 7 g/dL (ref 6.0–8.3)

## 2013-08-30 LAB — LIPID PANEL
Cholesterol: 190 mg/dL (ref 0–200)
Total CHOL/HDL Ratio: 5
Triglycerides: 235 mg/dL — ABNORMAL HIGH (ref 0.0–149.0)

## 2013-08-30 LAB — LDL CHOLESTEROL, DIRECT: Direct LDL: 119.2 mg/dL

## 2013-08-30 LAB — BASIC METABOLIC PANEL
BUN: 29 mg/dL — ABNORMAL HIGH (ref 6–23)
Calcium: 9.5 mg/dL (ref 8.4–10.5)
Chloride: 106 mEq/L (ref 96–112)
Creatinine, Ser: 1.1 mg/dL (ref 0.4–1.2)

## 2013-08-30 LAB — TSH: TSH: 7.88 u[IU]/mL — ABNORMAL HIGH (ref 0.35–5.50)

## 2013-08-30 MED ORDER — GLIMEPIRIDE 1 MG PO TABS: 1.0000 mg | ORAL_TABLET | Freq: Every day | ORAL | Status: DC

## 2013-08-30 MED ORDER — METFORMIN HCL ER 750 MG PO TB24: 750.0000 mg | ORAL_TABLET | Freq: Every day | ORAL | Status: DC

## 2013-08-30 MED ORDER — METOPROLOL TARTRATE 50 MG PO TABS: 50.0000 mg | ORAL_TABLET | Freq: Two times a day (BID) | ORAL | Status: DC

## 2013-08-30 MED ORDER — PRAVASTATIN SODIUM 40 MG PO TABS: 40.0000 mg | ORAL_TABLET | Freq: Every day | ORAL | Status: DC

## 2013-08-30 MED ORDER — LEVOTHYROXINE SODIUM 100 MCG PO TABS: 100.0000 ug | ORAL_TABLET | Freq: Every day | ORAL | Status: DC

## 2013-08-30 MED ORDER — LISINOPRIL-HYDROCHLOROTHIAZIDE 20-25 MG PO TABS: 1.0000 | ORAL_TABLET | Freq: Every day | ORAL | Status: DC

## 2013-08-30 NOTE — Patient Instructions (Signed)
Call and let me know if you have any diarrhea on your new metformin.

## 2013-08-30 NOTE — Progress Notes (Signed)
Subjective:    Patient ID: Michaela Rhodes, female    DOB: [birth year], 77 y.o.   MRN: [id number]  HPI  Patient here for medical followup. Problems include history of obesity, type 2 diabetes, hypertension, dyslipidemia, remote history of breast cancer, history of SVT, hypothyroidism. Medications reviewed. She stopped metformin on her own several weeks ago secondary to diarrhea. She had been on metformin 500 mg twice daily. Diarrhea has improved off this.  Last A1c 8.2%. Fasting CBGs around 160. Last eye exam was October and needs to set up repeat. Denies any neuropathy symptoms. Currently only takes Amaryl 1 mg daily. Needs flu vaccine  Denies any recent palpitations, dizziness, or chest pains. Hypertension well controlled with lisinopril HCTZ and metoprolol.  Takes levothyroxin for hypothyroidism. She needs followup labs. Checks pravastatin for hyperlipidemia. No myalgias.  Past Medical History  Diagnosis Date  . Hypertension   . Hypothyroidism   . Panic attacks     mild  . SVT (supraventricular tachycardia)     in the past  . DM2 (diabetes mellitus, type 2)    Past Surgical History  Procedure Laterality Date  . Appendectomy    . Cholecystectomy    . Mastectomy      Left  . Abdominal hysterectomy      BSO as well  . Reconstruction breast w/ latissimus dorsi flap    . Orif shoulder fracture  05/27/2012    Procedure: OPEN REDUCTION INTERNAL FIXATION (ORIF) SHOULDER FRACTURE;  Surgeon: [name], MD;  Location: [facility];  Service: Orthopedics;  Laterality: Right;  . Total shoulder arthroplasty  05/27/2012    Procedure: TOTAL SHOULDER ARTHROPLASTY;  Surgeon: [name], MD;  Location: [facility];  Service: Orthopedics;  Laterality: Right;    reports that she has never smoked. She does not have any smokeless tobacco history on file. She reports that she does not drink alcohol or use illicit drugs. family history includes Coronary artery disease in an other family member; Diabetes in  an other family member; Heart disease in her mother; Hypertension in her father, mother, sister, and another family member; Stroke in her father. Allergies  Allergen Reactions  . Penicillins Anaphylaxis and Hives     Review of Systems  Constitutional: Negative for appetite change, fatigue and unexpected weight change.  Eyes: Negative for visual disturbance.  Respiratory: Negative for cough, chest tightness, shortness of breath and wheezing.   Cardiovascular: Negative for chest pain, palpitations and leg swelling.  Endocrine: Negative for polydipsia and polyuria.  Genitourinary: Negative for dysuria.  Neurological: Negative for dizziness, seizures, syncope, weakness, light-headedness and headaches.       Objective:   Physical Exam  Constitutional: She is oriented to person, place, and time. She appears well-developed and well-nourished.  Neck: Neck supple. No thyromegaly present.  Cardiovascular: Normal rate and regular rhythm.   Pulmonary/Chest: Effort normal and breath sounds normal. No respiratory distress. She has no wheezes. She has no rales.  Musculoskeletal: She exhibits no edema.  Neurological: She is alert and oriented to person, place, and time. No cranial nerve deficit.          Assessment & Plan:  #1 type 2 diabetes. History of poor compliance. History of suboptimal control. Try changing to extended release metformin 750 mg once daily-secondary to diarrhea with immediate release. Continue low-dose Amaryl. Recheck A1c today. Set up eye exam. Work on weight loss. #2 hypertension. Stable. Refilled medications for one year #3 hypothyroidism. Recheck TSH. Refill levothyroxin for one year #  4 hyperlipidemia. Recheck lipid and hepatic panel. Refill pravastatin for one year #5 health maintenance. Flu vaccine given. Pneumovax up-to-date.

## 2013-09-03 ENCOUNTER — Other Ambulatory Visit: Payer: Self-pay

## 2013-09-03 MED ORDER — LEVOTHYROXINE SODIUM 112 MCG PO TABS: 112.0000 ug | ORAL_TABLET | Freq: Every day | ORAL | Status: DC

## 2013-09-12 ENCOUNTER — Encounter: Payer: Self-pay | Admitting: Cardiology

## 2013-09-12 ENCOUNTER — Ambulatory Visit (INDEPENDENT_AMBULATORY_CARE_PROVIDER_SITE_OTHER): Payer: Medicare Other | Admitting: Cardiology

## 2013-09-12 VITALS — BP 148/109 | HR 109 | Ht 66.0 in | Wt 226.0 lb

## 2013-09-12 DIAGNOSIS — I10 Essential (primary) hypertension: Secondary | ICD-10-CM

## 2013-09-12 DIAGNOSIS — R55 Syncope and collapse: Secondary | ICD-10-CM

## 2013-09-12 DIAGNOSIS — I498 Other specified cardiac arrhythmias: Secondary | ICD-10-CM

## 2013-09-12 NOTE — Patient Instructions (Addendum)
The current medical regimen is effective;  continue present plan and medications.  Follow up in 1 year with Dr Hochrein.  You will receive a letter in the mail 2 months before you are due.  Please call us when you receive this letter to schedule your follow up appointment.  

## 2013-09-12 NOTE — Progress Notes (Signed)
HPI The patient presents for followup of hypertension and SVT.  Since I last saw her she has had no syncopal episodes or recurrent symptomatic tachypalpitations that lasted for more than 15 minutes. She then she's done well. She's under stress trying to sell her house. The patient denies any new symptoms such as chest discomfort, neck or arm discomfort. There has been no new shortness of breath, PND or orthopnea. There has been no presyncope or syncope.  Her thyroid medicine was recently adjusted. Her A1c is elevated at 8.2.  Allergies  Allergen Reactions  . Penicillins Anaphylaxis and Hives    Current Outpatient Prescriptions  Medication Sig Dispense Refill  . calcium-vitamin D (OSCAL WITH D) 500-200 MG-UNIT per tablet Take 1 tablet by mouth daily.  100 tablet  2  . glimepiride (AMARYL) 1 MG tablet Take 1 tablet (1 mg total) by mouth daily before breakfast.  90 tablet  3  . levothyroxine (SYNTHROID, LEVOTHROID) 112 MCG tablet Take 1 tablet (112 mcg total) by mouth daily.  90 tablet  3  . lisinopril-hydrochlorothiazide (PRINZIDE,ZESTORETIC) 20-25 MG per tablet Take 1 tablet by mouth daily.  90 tablet  3  . metFORMIN (GLUCOPHAGE XR) 750 MG 24 hr tablet Take 1 tablet (750 mg total) by mouth daily with breakfast.  30 tablet  6  . metoprolol (LOPRESSOR) 50 MG tablet Take 1 tablet (50 mg total) by mouth 2 (two) times daily.  180 tablet  3  . pravastatin (PRAVACHOL) 40 MG tablet Take 1 tablet (40 mg total) by mouth daily.  90 tablet  3   No current facility-administered medications for this visit.    Past Medical History  Diagnosis Date  . Hypertension   . Hypothyroidism   . Panic attacks     mild  . SVT (supraventricular tachycardia)     in the past  . DM2 (diabetes mellitus, type 2)     Past Surgical History  Procedure Laterality Date  . Appendectomy    . Cholecystectomy    . Mastectomy      Left  . Abdominal hysterectomy      BSO as well  . Reconstruction breast w/ latissimus  dorsi flap    . Orif shoulder fracture  05/27/2012    Procedure: OPEN REDUCTION INTERNAL FIXATION (ORIF) SHOULDER FRACTURE;  Surgeon: [name], MD;  Location: [facility];  Service: Orthopedics;  Laterality: Right;  . Total shoulder arthroplasty  05/27/2012    Procedure: TOTAL SHOULDER ARTHROPLASTY;  Surgeon: [name], MD;  Location: [facility];  Service: Orthopedics;  Laterality: Right;    ROS:  As stated in the HPI and negative for all other systems.  PHYSICAL EXAM BP 148/109  Pulse 109  Ht 5\' 6"  (1.676 m)  Wt 226 lb (102.513 kg)  BMI 36.49 kg/m2 GENERAL:  Well appearing NECK:  No jugular venous distention, waveform within normal limits, carotid upstroke brisk and symmetric, no bruits, no thyromegaly LYMPHATICS:  No cervical, inguinal adenopathy LUNGS:  Clear to auscultation bilaterally BACK:  No CVA tenderness HEART:  PMI not displaced or sustained,S1 and S2 within normal limits, no S3, no S4, no clicks, no rubs, no murmurs ABD:  Flat, positive bowel sounds normal in frequency in pitch, no bruits, no rebound, no guarding, no midline pulsatile mass, no hepatomegaly, no splenomegaly EXT:  2 plus pulses throughout, no edema, no cyanosis no clubbing  EKG:  SINUS TACHYCARDIA, RATE 107, FIRST DEGREE av BLOCK, rightward axis, nonspecific T-wave flattening. No change from  previous.  09/12/2013  ASSESSMENT AND PLAN   SUPRAVENTRICULAR TACHYCARDIA -  She's had no symptomatic recurrence of this. I do not think is playing a role with regard to her recent event.  She does have a sinus tachycardia today and I note on her blood pressure diary that is usually quite elevated. I will check a TSH.  HYPERTENSION -  The BP diary of blood pressures are running with systolics in the 110 range and she tolerates this. No change in therapy is indicated.

## 2013-09-19 ENCOUNTER — Encounter: Payer: Self-pay | Admitting: Family Medicine

## 2013-11-28 ENCOUNTER — Ambulatory Visit: Payer: Medicare Other | Admitting: Family Medicine

## 2013-11-28 ENCOUNTER — Telehealth: Payer: Self-pay

## 2013-11-28 DIAGNOSIS — Z0289 Encounter for other administrative examinations: Secondary | ICD-10-CM

## 2013-11-28 NOTE — Telephone Encounter (Signed)
Left message that patient missed appt today

## 2014-05-16 ENCOUNTER — Other Ambulatory Visit: Payer: Self-pay

## 2014-05-16 DIAGNOSIS — E119 Type 2 diabetes mellitus without complications: Secondary | ICD-10-CM

## 2014-05-16 MED ORDER — METFORMIN HCL ER 750 MG PO TB24: 750.0000 mg | ORAL_TABLET | Freq: Every day | ORAL | Status: DC

## 2014-06-18 ENCOUNTER — Telehealth: Payer: Self-pay | Admitting: Family Medicine

## 2014-06-18 MED ORDER — METFORMIN HCL ER 750 MG PO TB24: 750.0000 mg | ORAL_TABLET | Freq: Every day | ORAL | Status: DC

## 2014-06-18 NOTE — Telephone Encounter (Signed)
Rx sent to pharmacy   

## 2014-06-18 NOTE — Telephone Encounter (Signed)
[facility] [place], [place] - [address] Is requesting 90 day re-fill on metFORMIN (GLUCOPHAGE XR) 750 MG 24 hr tablet

## 2014-08-13 LAB — HM DIABETES EYE EXAM

## 2014-08-14 ENCOUNTER — Encounter: Payer: Self-pay | Admitting: Family Medicine

## 2014-09-10 ENCOUNTER — Other Ambulatory Visit: Payer: Self-pay | Admitting: Cardiology

## 2014-10-14 ENCOUNTER — Encounter: Payer: Self-pay | Admitting: Cardiology

## 2014-10-14 ENCOUNTER — Ambulatory Visit (INDEPENDENT_AMBULATORY_CARE_PROVIDER_SITE_OTHER): Payer: Medicare Other | Admitting: Cardiology

## 2014-10-14 VITALS — BP 110/60 | HR 72 | Ht 67.0 in | Wt 206.0 lb

## 2014-10-14 DIAGNOSIS — I10 Essential (primary) hypertension: Secondary | ICD-10-CM

## 2014-10-14 DIAGNOSIS — I471 Supraventricular tachycardia, unspecified: Secondary | ICD-10-CM

## 2014-10-14 NOTE — Progress Notes (Signed)
HPI The patient presents for followup of hypertension and SVT.  Since I last saw her she has had no syncopal episodes or recurrent symptomatic tachypalpitations.  She is not exercising.  However, she does chores of daily living.  With this she denies any cardiovascular symptoms.  The patient denies any new symptoms such as chest discomfort, neck or arm discomfort. There has been no new shortness of breath, PND or orthopnea. There have been no reported palpitations, presyncope or syncope.  Allergies  Allergen Reactions  . Penicillins Anaphylaxis and Hives    Current Outpatient Prescriptions  Medication Sig Dispense Refill  . calcium-vitamin D (OSCAL WITH D) 500-200 MG-UNIT per tablet Take 1 tablet by mouth daily.  100 tablet  2  . glimepiride (AMARYL) 1 MG tablet Take 1 tablet (1 mg total) by mouth daily before breakfast.  90 tablet  3  . levothyroxine (SYNTHROID, LEVOTHROID) 112 MCG tablet Take 1 tablet (112 mcg total) by mouth daily.  90 tablet  3  . lisinopril-hydrochlorothiazide (PRINZIDE,ZESTORETIC) 20-25 MG per tablet TAKE ONE TABLET BY MOUTH ONCE DAILY.  90 tablet  0  . metFORMIN (GLUCOPHAGE XR) 750 MG 24 hr tablet Take 1 tablet (750 mg total) by mouth daily with breakfast.  90 tablet  0  . metoprolol (LOPRESSOR) 50 MG tablet TAKE ONE TABLET BY MOUTH TWICE DAILY.  180 tablet  0  . pravastatin (PRAVACHOL) 40 MG tablet TAKE ONE TABLET BY MOUTH ONCE DAILY.  90 tablet  0   No current facility-administered medications for this visit.    Past Medical History  Diagnosis Date  . Hypertension   . Hypothyroidism   . Panic attacks     mild  . SVT (supraventricular tachycardia)     in the past  . DM2 (diabetes mellitus, type 2)     Past Surgical History  Procedure Laterality Date  . Appendectomy    . Cholecystectomy    . Mastectomy      Left  . Abdominal hysterectomy      BSO as well  . Reconstruction breast w/ latissimus dorsi flap    . Orif shoulder fracture  05/27/2012   Procedure: OPEN REDUCTION INTERNAL FIXATION (ORIF) SHOULDER FRACTURE;  Surgeon: Johnny Bridge, MD;  Location: [place];  Service: Orthopedics;  Laterality: Right;  . Total shoulder arthroplasty  05/27/2012    Procedure: TOTAL SHOULDER ARTHROPLASTY;  Surgeon: Johnny Bridge, MD;  Location: [place];  Service: Orthopedics;  Laterality: Right;    ROS:  As stated in the HPI and negative for all other systems.  PHYSICAL EXAM BP 110/60  Pulse 72  Ht 5\' 7"  (1.702 m)  Wt 206 lb (93.441 kg)  BMI 32.26 kg/m2 GENERAL:  Well appearing NECK:  No jugular venous distention, waveform within normal limits, carotid upstroke brisk and symmetric, no bruits, no thyromegaly LYMPHATICS:  No cervical, inguinal adenopathy LUNGS:  Clear to auscultation bilaterally BACK:  No CVA tenderness HEART:  PMI not displaced or sustained,S1 and S2 within normal limits, no S3, no S4, no clicks, no rubs, no murmurs ABD:  Flat, positive bowel sounds normal in frequency in pitch, no bruits, no rebound, no guarding, no midline pulsatile mass, no hepatomegaly, no splenomegaly EXT:  2 plus pulses throughout, no edema, no cyanosis no clubbing  EKG:  Sinus rhythm, rate 72, axis within normal limits, RSR in V1, poor anterior R wave progression, cannot exclude old inferior infarct, intervals within normal limits, no acute ST-T wave changes. I will a  10/14/2014  ASSESSMENT AND PLAN   SUPRAVENTRICULAR TACHYCARDIA -  She's had no symptomatic recurrence of this. She has had no falls or syncope.  Therefore, no further work up is indicated.   HYPERTENSION -  The blood pressure is at target. No change in medications is indicated. We will continue with therapeutic lifestyle changes (TLC).

## 2014-10-14 NOTE — Patient Instructions (Signed)
Your physician recommends that you schedule a follow-up appointment in: as needed with Dr. Hochrein  

## 2014-12-16 ENCOUNTER — Telehealth: Payer: Self-pay

## 2014-12-16 NOTE — Telephone Encounter (Signed)
Pt is calling to schedule an appt with Dr. Elease Hashimoto to f/u on meds and to get her flu shot. Please call pt and schedule.

## 2014-12-16 NOTE — Telephone Encounter (Signed)
Pt sch for 12-22-2014

## 2014-12-22 ENCOUNTER — Ambulatory Visit (INDEPENDENT_AMBULATORY_CARE_PROVIDER_SITE_OTHER): Payer: Commercial Managed Care - HMO | Admitting: Family Medicine

## 2014-12-22 ENCOUNTER — Ambulatory Visit (INDEPENDENT_AMBULATORY_CARE_PROVIDER_SITE_OTHER): Payer: Commercial Managed Care - HMO

## 2014-12-22 ENCOUNTER — Encounter: Payer: Self-pay | Admitting: Family Medicine

## 2014-12-22 VITALS — BP 120/80 | HR 113 | Temp 98.2°F | Wt 214.0 lb

## 2014-12-22 DIAGNOSIS — L259 Unspecified contact dermatitis, unspecified cause: Secondary | ICD-10-CM

## 2014-12-22 DIAGNOSIS — IMO0002 Reserved for concepts with insufficient information to code with codable children: Secondary | ICD-10-CM

## 2014-12-22 DIAGNOSIS — E039 Hypothyroidism, unspecified: Secondary | ICD-10-CM | POA: Diagnosis not present

## 2014-12-22 DIAGNOSIS — E1165 Type 2 diabetes mellitus with hyperglycemia: Secondary | ICD-10-CM | POA: Diagnosis not present

## 2014-12-22 DIAGNOSIS — I10 Essential (primary) hypertension: Secondary | ICD-10-CM | POA: Diagnosis not present

## 2014-12-22 DIAGNOSIS — E785 Hyperlipidemia, unspecified: Secondary | ICD-10-CM

## 2014-12-22 DIAGNOSIS — Z23 Encounter for immunization: Secondary | ICD-10-CM

## 2014-12-22 LAB — HEPATIC FUNCTION PANEL
ALT: 15 U/L (ref 0–35)
AST: 15 U/L (ref 0–37)
Albumin: 4 g/dL (ref 3.5–5.2)
Alkaline Phosphatase: 129 U/L — ABNORMAL HIGH (ref 39–117)
Bilirubin, Direct: 0.1 mg/dL (ref 0.0–0.3)
TOTAL PROTEIN: 7.1 g/dL (ref 6.0–8.3)
Total Bilirubin: 0.6 mg/dL (ref 0.2–1.2)

## 2014-12-22 LAB — BASIC METABOLIC PANEL
BUN: 41 mg/dL — ABNORMAL HIGH (ref 6–23)
CALCIUM: 9.6 mg/dL (ref 8.4–10.5)
CO2: 26 mEq/L (ref 19–32)
Chloride: 102 mEq/L (ref 96–112)
Creatinine, Ser: 1.4 mg/dL — ABNORMAL HIGH (ref 0.4–1.2)
GFR: 39.12 mL/min — ABNORMAL LOW (ref >60.00)
GLUCOSE: 308 mg/dL — AB (ref 70–99)
Potassium: 5 mEq/L (ref 3.5–5.1)
Sodium: 139 mEq/L (ref 135–145)

## 2014-12-22 LAB — TSH: TSH: 9.04 u[IU]/mL — ABNORMAL HIGH (ref 0.35–4.50)

## 2014-12-22 LAB — LIPID PANEL
CHOLESTEROL: 154 mg/dL (ref 0–200)
HDL: 47.4 mg/dL (ref >39.00)
LDL Cholesterol: 84 mg/dL (ref 0–99)
NONHDL: 106.6
TRIGLYCERIDES: 113 mg/dL (ref 0.0–149.0)
Total CHOL/HDL Ratio: 3
VLDL: 22.6 mg/dL (ref 0.0–40.0)

## 2014-12-22 LAB — HEMOGLOBIN A1C: Hgb A1c MFr Bld: 10.1 % — ABNORMAL HIGH (ref 4.6–6.5)

## 2014-12-22 LAB — HM DIABETES FOOT EXAM: HM DIABETIC FOOT EXAM: NORMAL

## 2014-12-22 MED ORDER — METHYLPREDNISOLONE ACETATE 80 MG/ML IJ SUSP
80.0000 mg | Freq: Once | INTRAMUSCULAR | Status: AC
  Administered 2014-12-22: 80 mg via INTRAMUSCULAR

## 2014-12-22 NOTE — Progress Notes (Signed)
Pre visit review using our clinic review tool, if applicable. No additional management support is needed unless otherwise documented below in the visit note. 

## 2014-12-22 NOTE — Patient Instructions (Signed)

## 2014-12-22 NOTE — Addendum Note (Signed)
Addended by: Marcina Millard on: 12/22/2014 09:48 AM   Modules accepted: Orders

## 2014-12-22 NOTE — Progress Notes (Signed)
Subjective:    Patient ID: Michaela Rhodes, female    DOB: May 21, 1936, 79 y.o.   MRN: [id number]  HPI Patient seen for medical follow-up. Her chronic problems include history of obesity, type 2 diabetes, hypothyroidism, hypertension, dyslipidemia. Medications reviewed. Compliant with all. She rarely checks blood sugars. Last A1c was elevated. She also had elevated TSH last year we increased her levothyroxin and she has not had follow-up labs in over one year. She still needs flu vaccine. No recent chest pains. No dizziness. No headaches. Generally feels well. No fatigue issues.  She did have recent left ankle injury. She sprained her ankle and applied some type of sports cream and developed pruritic rash on her left leg along with some edema with the rash. No pain. No fevers or chills. She also has small eschar which she thinks may have occurred with the injury. On exam 6 weeks ago reportedly normal  Reviewed with no change:  Past Medical History  Diagnosis Date  . Hypertension   . Hypothyroidism   . Panic attacks     mild  . SVT (supraventricular tachycardia)     in the past  . DM2 (diabetes mellitus, type 2)    Past Surgical History  Procedure Laterality Date  . Appendectomy    . Cholecystectomy    . Mastectomy      Left  . Abdominal hysterectomy      BSO as well  . Reconstruction breast w/ latissimus dorsi flap    . Orif shoulder fracture  05/27/2012    Procedure: OPEN REDUCTION INTERNAL FIXATION (ORIF) SHOULDER FRACTURE;  Surgeon: Johnny Bridge, MD;  Location: [place];  Service: Orthopedics;  Laterality: Right;  . Total shoulder arthroplasty  05/27/2012    Procedure: TOTAL SHOULDER ARTHROPLASTY;  Surgeon: Johnny Bridge, MD;  Location: [place];  Service: Orthopedics;  Laterality: Right;    reports that she has never smoked. She does not have any smokeless tobacco history on file. She reports that she does not drink alcohol or use illicit drugs. family history includes Coronary  artery disease in an other family member; Diabetes in an other family member; Heart disease in her mother; Hypertension in her father, mother, sister, and another family member; Stroke in her father. Allergies  Allergen Reactions  . Penicillins Anaphylaxis and Hives      Review of Systems  Constitutional: Negative for fatigue.  Eyes: Negative for visual disturbance.  Respiratory: Negative for cough, chest tightness, shortness of breath and wheezing.   Cardiovascular: Positive for leg swelling. Negative for chest pain and palpitations.  Gastrointestinal: Negative for nausea and vomiting.  Endocrine: Negative for polydipsia and polyuria.  Genitourinary: Negative for dysuria.  Neurological: Negative for dizziness, seizures, syncope, weakness, light-headedness and headaches.       Objective:   Physical Exam  Constitutional: She appears well-developed and well-nourished.  Neck: Neck supple. No thyromegaly present.  Cardiovascular: Normal rate and regular rhythm.   Pulmonary/Chest: Effort normal and breath sounds normal. No respiratory distress. She has no wheezes. She has no rales.  Musculoskeletal:  Left leg reveals 1 cm eschar anterior leg. She has some diffuse edema of the left leg mostly anterior and lateral. Nontender. No calf tenderness. Good distal pulses  Lymphadenopathy:    She has no cervical adenopathy.  Skin:  Slightly raised nontender rash involving much of the left anterior and lateral leg. This is nontender and no increased warmth. Consistent with contact dermatitis. No evidence to suggest cellulitis  Assessment & Plan:  #1 type 2 diabetes. History of poor control and poor compliance with follow-up. Repeat A1c. Monitor blood sugar more frequently #2 dyslipidemia. Recheck lipid panel. Goal LDL less than 100. Continue pravastatin #3 hypertension stable and at goal #4 hypothyroidism. Recheck TSH. #5 contact dermatitis left leg. Discussed options. Cautious  Depo-Medrol 80 mg IM. Monitor blood sugars closely and she is aware these may increase slightly over the next week or so. May need to double up her glimepiride short-term watch closely for signs of secondary infection #6 health maintenance. Flu vaccine given

## 2014-12-23 ENCOUNTER — Other Ambulatory Visit: Payer: Self-pay

## 2014-12-23 ENCOUNTER — Telehealth: Payer: Self-pay | Admitting: Family Medicine

## 2014-12-23 ENCOUNTER — Other Ambulatory Visit: Payer: Self-pay | Admitting: Family Medicine

## 2014-12-23 DIAGNOSIS — E039 Hypothyroidism, unspecified: Secondary | ICD-10-CM

## 2014-12-23 DIAGNOSIS — IMO0002 Reserved for concepts with insufficient information to code with codable children: Secondary | ICD-10-CM

## 2014-12-23 DIAGNOSIS — E1165 Type 2 diabetes mellitus with hyperglycemia: Secondary | ICD-10-CM

## 2014-12-23 MED ORDER — METOPROLOL TARTRATE 50 MG PO TABS: 50.0000 mg | ORAL_TABLET | Freq: Two times a day (BID) | ORAL | Status: DC

## 2014-12-23 MED ORDER — GLIMEPIRIDE 2 MG PO TABS: 2.0000 mg | ORAL_TABLET | Freq: Every day | ORAL | Status: DC

## 2014-12-23 MED ORDER — METFORMIN HCL ER 750 MG PO TB24: 750.0000 mg | ORAL_TABLET | Freq: Every day | ORAL | Status: DC

## 2014-12-23 MED ORDER — LEVOTHYROXINE SODIUM 125 MCG PO TABS: 125.0000 ug | ORAL_TABLET | Freq: Every day | ORAL | Status: DC

## 2014-12-23 MED ORDER — LISINOPRIL-HYDROCHLOROTHIAZIDE 20-25 MG PO TABS: 1.0000 | ORAL_TABLET | Freq: Every day | ORAL | Status: DC

## 2014-12-23 MED ORDER — PRAVASTATIN SODIUM 40 MG PO TABS: 40.0000 mg | ORAL_TABLET | Freq: Every day | ORAL | Status: DC

## 2014-12-23 NOTE — Telephone Encounter (Signed)
emmi mailed  °

## 2014-12-25 ENCOUNTER — Encounter: Payer: Self-pay | Admitting: Family Medicine

## 2014-12-25 ENCOUNTER — Ambulatory Visit (INDEPENDENT_AMBULATORY_CARE_PROVIDER_SITE_OTHER): Payer: Commercial Managed Care - HMO | Admitting: Family Medicine

## 2014-12-25 ENCOUNTER — Ambulatory Visit (HOSPITAL_BASED_OUTPATIENT_CLINIC_OR_DEPARTMENT_OTHER): Payer: Commercial Managed Care - HMO | Admitting: Cardiology

## 2014-12-25 VITALS — BP 124/80 | HR 80 | Temp 97.9°F

## 2014-12-25 DIAGNOSIS — F410 Panic disorder [episodic paroxysmal anxiety] without agoraphobia: Secondary | ICD-10-CM | POA: Diagnosis present

## 2014-12-25 DIAGNOSIS — M7732 Calcaneal spur, left foot: Secondary | ICD-10-CM | POA: Diagnosis not present

## 2014-12-25 DIAGNOSIS — E785 Hyperlipidemia, unspecified: Secondary | ICD-10-CM | POA: Diagnosis present

## 2014-12-25 DIAGNOSIS — X58XXXA Exposure to other specified factors, initial encounter: Secondary | ICD-10-CM

## 2014-12-25 DIAGNOSIS — I10 Essential (primary) hypertension: Secondary | ICD-10-CM

## 2014-12-25 DIAGNOSIS — I82812 Embolism and thrombosis of superficial veins of left lower extremities: Secondary | ICD-10-CM | POA: Insufficient documentation

## 2014-12-25 DIAGNOSIS — Z888 Allergy status to other drugs, medicaments and biological substances status: Secondary | ICD-10-CM | POA: Diagnosis not present

## 2014-12-25 DIAGNOSIS — E119 Type 2 diabetes mellitus without complications: Secondary | ICD-10-CM | POA: Insufficient documentation

## 2014-12-25 DIAGNOSIS — Z96619 Presence of unspecified artificial shoulder joint: Secondary | ICD-10-CM | POA: Diagnosis present

## 2014-12-25 DIAGNOSIS — R600 Localized edema: Secondary | ICD-10-CM

## 2014-12-25 DIAGNOSIS — Z90710 Acquired absence of both cervix and uterus: Secondary | ICD-10-CM | POA: Diagnosis not present

## 2014-12-25 DIAGNOSIS — N179 Acute kidney failure, unspecified: Secondary | ICD-10-CM | POA: Diagnosis present

## 2014-12-25 DIAGNOSIS — E1165 Type 2 diabetes mellitus with hyperglycemia: Secondary | ICD-10-CM | POA: Diagnosis present

## 2014-12-25 DIAGNOSIS — L259 Unspecified contact dermatitis, unspecified cause: Secondary | ICD-10-CM | POA: Diagnosis present

## 2014-12-25 DIAGNOSIS — Z9049 Acquired absence of other specified parts of digestive tract: Secondary | ICD-10-CM | POA: Diagnosis present

## 2014-12-25 DIAGNOSIS — E039 Hypothyroidism, unspecified: Secondary | ICD-10-CM | POA: Diagnosis present

## 2014-12-25 DIAGNOSIS — M7989 Other specified soft tissue disorders: Secondary | ICD-10-CM

## 2014-12-25 DIAGNOSIS — L0390 Cellulitis, unspecified: Secondary | ICD-10-CM | POA: Diagnosis not present

## 2014-12-25 DIAGNOSIS — E038 Other specified hypothyroidism: Secondary | ICD-10-CM | POA: Diagnosis not present

## 2014-12-25 DIAGNOSIS — D72829 Elevated white blood cell count, unspecified: Secondary | ICD-10-CM | POA: Diagnosis present

## 2014-12-25 DIAGNOSIS — S93402A Sprain of unspecified ligament of left ankle, initial encounter: Secondary | ICD-10-CM | POA: Insufficient documentation

## 2014-12-25 DIAGNOSIS — R609 Edema, unspecified: Secondary | ICD-10-CM | POA: Diagnosis not present

## 2014-12-25 DIAGNOSIS — S8012XA Contusion of left lower leg, initial encounter: Secondary | ICD-10-CM | POA: Diagnosis not present

## 2014-12-25 DIAGNOSIS — Z880 Allergy status to penicillin: Secondary | ICD-10-CM | POA: Diagnosis not present

## 2014-12-25 DIAGNOSIS — L03116 Cellulitis of left lower limb: Secondary | ICD-10-CM | POA: Diagnosis present

## 2014-12-25 DIAGNOSIS — S81802A Unspecified open wound, left lower leg, initial encounter: Secondary | ICD-10-CM | POA: Diagnosis not present

## 2014-12-25 DIAGNOSIS — S8992XA Unspecified injury of left lower leg, initial encounter: Secondary | ICD-10-CM | POA: Diagnosis not present

## 2014-12-25 DIAGNOSIS — R21 Rash and other nonspecific skin eruption: Secondary | ICD-10-CM | POA: Diagnosis present

## 2014-12-25 MED ORDER — CEPHALEXIN 500 MG PO CAPS: 500.0000 mg | ORAL_CAPSULE | Freq: Three times a day (TID) | ORAL | Status: DC

## 2014-12-25 NOTE — Patient Instructions (Signed)

## 2014-12-25 NOTE — Progress Notes (Signed)
Left lower venous duplex performed  

## 2014-12-25 NOTE — Progress Notes (Signed)
Pre visit review using our clinic review tool, if applicable. No additional management support is needed unless otherwise documented below in the visit note. 

## 2014-12-25 NOTE — Progress Notes (Signed)
Subjective:    Patient ID: Michaela Rhodes, female    DOB: 1936/09/08, 79 y.o.   MRN: [id number]  HPI Patient seen with some progressive left leg swelling, drainage, erythema and warmth to touch. Refer to recent note for details: "She did have recent left ankle injury. She sprained her ankle and applied some type of sports cream and developed pruritic rash on her left leg along with some edema with the rash. No pain. No fevers or chills. She also has small eschar which she thinks may have occurred with the injury. On exam 6 weeks ago reportedly normal"  This appeared to be compatible with contact type dermatitis. She received Depo-Medrol but has not seen any improvement in superficial blisters. She has a little more tenderness to palpation now. She is still ambulating without difficulty. No fevers or chills. She does not recall direct injury to her mid leg region but did have small eschar there without visible drainage. Area around the eschar appears to be slightly more swollen now. Patient does have history of type 2 diabetes  Past Medical History  Diagnosis Date  . Hypertension   . Hypothyroidism   . Panic attacks     mild  . SVT (supraventricular tachycardia)     in the past  . DM2 (diabetes mellitus, type 2)    Past Surgical History  Procedure Laterality Date  . Appendectomy    . Cholecystectomy    . Mastectomy      Left  . Abdominal hysterectomy      BSO as well  . Reconstruction breast w/ latissimus dorsi flap    . Orif shoulder fracture  05/27/2012    Procedure: OPEN REDUCTION INTERNAL FIXATION (ORIF) SHOULDER FRACTURE;  Surgeon: Johnny Bridge, MD;  Location: [place];  Service: Orthopedics;  Laterality: Right;  . Total shoulder arthroplasty  05/27/2012    Procedure: TOTAL SHOULDER ARTHROPLASTY;  Surgeon: Johnny Bridge, MD;  Location: [place];  Service: Orthopedics;  Laterality: Right;    reports that she has never smoked. She does not have any smokeless tobacco history on  file. She reports that she does not drink alcohol or use illicit drugs. family history includes Coronary artery disease in an other family member; Diabetes in an other family member; Heart disease in her mother; Hypertension in her father, mother, sister, and another family member; Stroke in her father. Allergies  Allergen Reactions  . Penicillins Anaphylaxis and Hives       Review of Systems  Constitutional: Negative for fever and chills.  Respiratory: Negative for cough and shortness of breath.   Cardiovascular: Positive for leg swelling. Negative for chest pain and palpitations.  Gastrointestinal: Negative for nausea and vomiting.  Neurological: Negative for dizziness.       Objective:   Physical Exam  Constitutional: She appears well-developed and well-nourished.  Cardiovascular: Normal rate and regular rhythm.   Pulmonary/Chest: Effort normal and breath sounds normal. No respiratory distress. She has no wheezes. She has no rales.  Musculoskeletal:  Left lower extremity reveals fairly diffuse edema entire left lower extremity. She has large area involving most of the ankle and leg with erythema and slight warmth. She has some superficial vesicles are draining in places She has eschar along the midportion of the leg and approximately 3-4 cm surrounding zone of some fluctuance and minimal tenderness. No visible purulent drainage. No foul odor          Assessment & Plan:  Left leg edema with probable  cellulitis changes. Obtain venous Doppler to rule out DVT. Start Keflex 500 mg 3 times a day for 10 days. Set up orthopedic referral as she has some mild fluctuance near site of eschar-?hematoma.

## 2014-12-26 ENCOUNTER — Emergency Department (HOSPITAL_COMMUNITY): Payer: Commercial Managed Care - HMO

## 2014-12-26 ENCOUNTER — Encounter (HOSPITAL_COMMUNITY): Payer: Self-pay | Admitting: Emergency Medicine

## 2014-12-26 ENCOUNTER — Inpatient Hospital Stay (HOSPITAL_COMMUNITY)
Admission: EM | Admit: 2014-12-26 | Discharge: 2014-12-31 | DRG: 603 | Disposition: A | Discharge status: Home / Self Care | Payer: Commercial Managed Care - HMO | Attending: Internal Medicine | Admitting: Internal Medicine

## 2014-12-26 DIAGNOSIS — Z96619 Presence of unspecified artificial shoulder joint: Secondary | ICD-10-CM | POA: Diagnosis present

## 2014-12-26 DIAGNOSIS — T148XXA Other injury of unspecified body region, initial encounter: Secondary | ICD-10-CM

## 2014-12-26 DIAGNOSIS — E039 Hypothyroidism, unspecified: Secondary | ICD-10-CM | POA: Diagnosis present

## 2014-12-26 DIAGNOSIS — Z9049 Acquired absence of other specified parts of digestive tract: Secondary | ICD-10-CM | POA: Diagnosis present

## 2014-12-26 DIAGNOSIS — Z888 Allergy status to other drugs, medicaments and biological substances status: Secondary | ICD-10-CM | POA: Diagnosis not present

## 2014-12-26 DIAGNOSIS — N179 Acute kidney failure, unspecified: Secondary | ICD-10-CM | POA: Diagnosis present

## 2014-12-26 DIAGNOSIS — D72829 Elevated white blood cell count, unspecified: Secondary | ICD-10-CM | POA: Diagnosis present

## 2014-12-26 DIAGNOSIS — L0390 Cellulitis, unspecified: Secondary | ICD-10-CM | POA: Diagnosis not present

## 2014-12-26 DIAGNOSIS — R739 Hyperglycemia, unspecified: Secondary | ICD-10-CM | POA: Diagnosis present

## 2014-12-26 DIAGNOSIS — Z880 Allergy status to penicillin: Secondary | ICD-10-CM

## 2014-12-26 DIAGNOSIS — Z90710 Acquired absence of both cervix and uterus: Secondary | ICD-10-CM | POA: Diagnosis not present

## 2014-12-26 DIAGNOSIS — I10 Essential (primary) hypertension: Secondary | ICD-10-CM | POA: Diagnosis present

## 2014-12-26 DIAGNOSIS — M869 Osteomyelitis, unspecified: Secondary | ICD-10-CM

## 2014-12-26 DIAGNOSIS — E038 Other specified hypothyroidism: Secondary | ICD-10-CM | POA: Insufficient documentation

## 2014-12-26 DIAGNOSIS — E785 Hyperlipidemia, unspecified: Secondary | ICD-10-CM | POA: Diagnosis present

## 2014-12-26 DIAGNOSIS — E1165 Type 2 diabetes mellitus with hyperglycemia: Secondary | ICD-10-CM | POA: Diagnosis present

## 2014-12-26 DIAGNOSIS — L03116 Cellulitis of left lower limb: Secondary | ICD-10-CM | POA: Diagnosis present

## 2014-12-26 DIAGNOSIS — L259 Unspecified contact dermatitis, unspecified cause: Secondary | ICD-10-CM | POA: Diagnosis present

## 2014-12-26 DIAGNOSIS — R609 Edema, unspecified: Secondary | ICD-10-CM

## 2014-12-26 DIAGNOSIS — F410 Panic disorder [episodic paroxysmal anxiety] without agoraphobia: Secondary | ICD-10-CM | POA: Diagnosis present

## 2014-12-26 DIAGNOSIS — R21 Rash and other nonspecific skin eruption: Secondary | ICD-10-CM | POA: Diagnosis present

## 2014-12-26 HISTORY — DX: Hyperlipidemia, unspecified: E78.5

## 2014-12-26 LAB — CBC WITH DIFFERENTIAL/PLATELET
BASOS ABS: 0.1 10*3/uL (ref 0.0–0.1)
Basophils Relative: 0 % (ref 0–1)
Eosinophils Absolute: 1.3 10*3/uL — ABNORMAL HIGH (ref 0.0–0.7)
Eosinophils Relative: 9 % — ABNORMAL HIGH (ref 0–5)
HCT: 40.6 % (ref 36.0–46.0)
Hemoglobin: 13.5 g/dL (ref 12.0–15.0)
LYMPHS ABS: 2.4 10*3/uL (ref 0.7–4.0)
Lymphocytes Relative: 17 % (ref 12–46)
MCH: 28.2 pg (ref 26.0–34.0)
MCHC: 33.3 g/dL (ref 30.0–36.0)
MCV: 84.9 fL (ref 78.0–100.0)
MONO ABS: 0.7 10*3/uL (ref 0.1–1.0)
MONOS PCT: 5 % (ref 3–12)
NEUTROS PCT: 69 % (ref 43–77)
Neutro Abs: 9.9 10*3/uL — ABNORMAL HIGH (ref 1.7–7.7)
Platelets: 340 10*3/uL (ref 150–400)
RBC: 4.78 MIL/uL (ref 3.87–5.11)
RDW: 12.8 % (ref 11.5–15.5)
WBC: 14.3 10*3/uL — AB (ref 4.0–10.5)

## 2014-12-26 LAB — I-STAT CHEM 8, ED
BUN: 48 mg/dL — AB (ref 6–23)
CREATININE: 1.4 mg/dL — AB (ref 0.50–1.10)
Calcium, Ion: 1.21 mmol/L (ref 1.13–1.30)
Chloride: 103 mEq/L (ref 96–112)
GLUCOSE: 164 mg/dL — AB (ref 70–99)
HCT: 43 % (ref 36.0–46.0)
Hemoglobin: 14.6 g/dL (ref 12.0–15.0)
Potassium: 4.3 mmol/L (ref 3.5–5.1)
SODIUM: 141 mmol/L (ref 135–145)
TCO2: 24 mmol/L (ref 0–100)

## 2014-12-26 MED ORDER — LIDOCAINE-EPINEPHRINE 1 %-1:100000 IJ SOLN
10.0000 mL | Freq: Once | INTRAMUSCULAR | Status: DC
  Filled 2014-12-26: qty 1

## 2014-12-26 MED ORDER — VANCOMYCIN HCL IN DEXTROSE 1-5 GM/200ML-% IV SOLN
1000.0000 mg | Freq: Once | INTRAVENOUS | Status: AC
  Administered 2014-12-26: 1000 mg via INTRAVENOUS
  Filled 2014-12-26: qty 200

## 2014-12-26 MED ORDER — SODIUM CHLORIDE 0.9 % IV SOLN
Freq: Once | INTRAVENOUS | Status: AC
  Administered 2014-12-26: via INTRAVENOUS

## 2014-12-26 NOTE — ED Notes (Signed)
No answer

## 2014-12-26 NOTE — H&P (Signed)
Michaela Rhodes is an 79 y.o. female.   Dr. Elease Hashimoto (pcp) Michaela Rhodes (ortho)  Chief Complaint: rash HPI: 79 yo female with dm2, htn, hyperlipidemia, hypothyroidism apparently c/o left leg redness and swelling x 1 week.  Pt was tx with keflex which gave her itching.  Pt had u/s at orthopedics which was negative for DVT.  Pt presented to the ED for evaluation and Dr. Sabra Heck performed and I and D.  And found blood.  No pus.  Denies fever, chills.  Pt will be admitted for cellulitis.   Past Medical History  Diagnosis Date  . Hypertension   . Hypothyroidism   . Panic attacks     mild  . SVT (supraventricular tachycardia)     in the past  . DM2 (diabetes mellitus, type 2)   . Hyperlipidemia     Past Surgical History  Procedure Laterality Date  . Appendectomy    . Cholecystectomy    . Mastectomy      Left  . Abdominal hysterectomy      BSO as well  . Reconstruction breast w/ latissimus dorsi flap    . Orif shoulder fracture  05/27/2012    Procedure: OPEN REDUCTION INTERNAL FIXATION (ORIF) SHOULDER FRACTURE;  Surgeon: Johnny Bridge, MD;  Location: [place];  Service: Orthopedics;  Laterality: Right;  . Total shoulder arthroplasty  05/27/2012    Procedure: TOTAL SHOULDER ARTHROPLASTY;  Surgeon: Johnny Bridge, MD;  Location: [place];  Service: Orthopedics;  Laterality: Right;    Family History  Problem Relation Age of Onset  . Coronary artery disease      family hx of  . Diabetes      family hx of  . Hypertension      family hx of  . Heart disease Mother   . Hypertension Mother   . Stroke Father   . Hypertension Father   . Hypertension Sister    Social History:  reports that she has never smoked. She does not have any smokeless tobacco history on file. She reports that she does not drink alcohol or use illicit drugs.  Allergies:  Allergies  Allergen Reactions  . Penicillins Anaphylaxis and Hives  . Keflex [Cephalexin]     Rash      (Not in a hospital  admission)  Results for orders placed or performed during the hospital encounter of 12/26/14 (from the past 48 hour(s))  CBC with Differential     Status: Abnormal   Collection Time: 12/26/14 10:36 PM  Result Value Ref Range   WBC 14.3 (H) 4.0 - 10.5 K/uL   RBC 4.78 3.87 - 5.11 MIL/uL   Hemoglobin 13.5 12.0 - 15.0 g/dL   HCT 40.6 36.0 - 46.0 %   MCV 84.9 78.0 - 100.0 fL   MCH 28.2 26.0 - 34.0 pg   MCHC 33.3 30.0 - 36.0 g/dL   RDW 12.8 11.5 - 15.5 %   Platelets 340 150 - 400 K/uL   Neutrophils Relative % 69 43 - 77 %   Neutro Abs 9.9 (H) 1.7 - 7.7 K/uL   Lymphocytes Relative 17 12 - 46 %   Lymphs Abs 2.4 0.7 - 4.0 K/uL   Monocytes Relative 5 3 - 12 %   Monocytes Absolute 0.7 0.1 - 1.0 K/uL   Eosinophils Relative 9 (H) 0 - 5 %   Eosinophils Absolute 1.3 (H) 0.0 - 0.7 K/uL   Basophils Relative 0 0 - 1 %   Basophils Absolute 0.1 0.0 -  0.1 K/uL  I-stat chem 8, ed     Status: Abnormal   Collection Time: 12/26/14 10:53 PM  Result Value Ref Range   Sodium 141 135 - 145 mmol/L   Potassium 4.3 3.5 - 5.1 mmol/L   Chloride 103 96 - 112 mEq/L   BUN 48 (H) 6 - 23 mg/dL   Creatinine, Ser 1.40 (H) 0.50 - 1.10 mg/dL   Glucose, Bld 164 (H) 70 - 99 mg/dL   Calcium, Ion 1.21 1.13 - 1.30 mmol/L   TCO2 24 0 - 100 mmol/L   Hemoglobin 14.6 12.0 - 15.0 g/dL   HCT 43.0 36.0 - 46.0 %   No results found.  Review of Systems  Constitutional: Negative for fever, chills, weight loss, malaise/fatigue and diaphoresis.  HENT: Negative for congestion, ear discharge, ear pain, hearing loss, nosebleeds, sore throat and tinnitus.   Eyes: Negative for blurred vision, double vision, photophobia, pain, discharge and redness.  Respiratory: Negative for cough, hemoptysis, sputum production, shortness of breath, wheezing and stridor.   Cardiovascular: Negative for chest pain, palpitations, orthopnea, claudication, leg swelling and PND.  Gastrointestinal: Negative for heartburn, nausea, vomiting, abdominal pain,  diarrhea, constipation, blood in stool and melena.  Genitourinary: Negative for dysuria, urgency, frequency, hematuria and flank pain.  Musculoskeletal: Negative for myalgias, back pain, joint pain, falls and neck pain.  Skin: Negative for itching and rash.  Neurological: Negative for dizziness, tingling, tremors, sensory change, speech change, focal weakness, seizures, loss of consciousness, weakness and headaches.  Endo/Heme/Allergies: Negative for environmental allergies and polydipsia. Does not bruise/bleed easily.  Psychiatric/Behavioral: Negative for depression, suicidal ideas, hallucinations, memory loss and substance abuse. The patient is not nervous/anxious and does not have insomnia.     Blood pressure 151/86, pulse 110, temperature 97.7 F (36.5 C), temperature source Oral, resp. rate 18, height 5\' 6"  (1.676 m), weight 95.255 kg (210 lb), SpO2 96 %. Physical Exam  Constitutional: She is oriented to person, place, and time. She appears well-developed and well-nourished.  HENT:  Head: Normocephalic and atraumatic.  Eyes: Conjunctivae and EOM are normal. Pupils are equal, round, and reactive to light. No scleral icterus.  Neck: Normal range of motion. Neck supple. No JVD present. No tracheal deviation present. No thyromegaly present.  Cardiovascular: Normal rate and regular rhythm.  Exam reveals no gallop and no friction rub.   No murmur heard. Respiratory: Effort normal and breath sounds normal. No respiratory distress. She has no wheezes. She has no rales.  GI: Soft. Bowel sounds are normal. She exhibits no distension. There is no tenderness. There is no rebound and no guarding.  Musculoskeletal: Normal range of motion. She exhibits no edema or tenderness.  Lymphadenopathy:    She has no cervical adenopathy.  Neurological: She is alert and oriented to person, place, and time. She has normal reflexes. She displays normal reflexes. No cranial nerve deficit. She exhibits normal muscle  tone. Coordination normal.  Skin: Rash noted. There is erythema. No pallor.  Psychiatric: She has a normal mood and affect. Her behavior is normal. Judgment and thought content normal.     Assessment/Plan Cellulitis Culture pending, appreciate ER input.  Start on iv vanco and iv levaquin Blood culture x2 sets  ARF:  Mild renal insufficiency Hydrate gently with normal saline Consider u/a, urine sodium urine creatinine and urine eosinophils if persistent  Hyperglycemia/Dm2: fsbs ac and qhs, iss  Hypothyroidism Check tsh  Hypertension:  Cont current tx    Jani Gravel 12/26/2014, 11:52 PM

## 2014-12-26 NOTE — ED Notes (Signed)
Pt sent by PCP for treatment for possible infection in left lower leg with stasis ulcers and blistering; pt had neg [place] already

## 2014-12-26 NOTE — ED Provider Notes (Signed)
CSN: [id number]     Arrival date & time 12/26/14  1722 History   First MD Initiated Contact with Patient 12/26/14 2157     Chief Complaint  Patient presents with  . Leg Pain     (Consider location/radiation/quality/duration/timing/severity/associated sxs/prior Treatment) HPI Comments: 79 year old female who is a diabetic who presents with left lower extremity swelling after bumping her anterior leg 2 weeks ago when she fell up the steps. She started with a small amount of redness which has progressed to involve her entire left lower extremity from the knee through the foot and is now associated with blistering to the right and left lateral ankles. She was seen at the orthopedic office today for an ultrasound of the leg which was negative reportedly for DVT, she was sent to the hospital for evaluation of infectious symptoms. She has been on Keflex by her family doctor but this has not helped in fact it is constant allergic reaction which caused itching. She has a known sensitivity to penicillin. She denies systemic symptoms including fevers chills nausea or vomiting. The symptoms are persistent, gradually worsening and are now severe. She has minimal pain in her leg but states that she has diabetic neuropathy which causes decreased sensation at baseline.  Patient is a 79 y.o. female presenting with leg pain. The history is provided by the patient.  Leg Pain   Past Medical History  Diagnosis Date  . Hypertension   . Hypothyroidism   . Panic attacks     mild  . SVT (supraventricular tachycardia)     in the past  . DM2 (diabetes mellitus, type 2)    Past Surgical History  Procedure Laterality Date  . Appendectomy    . Cholecystectomy    . Mastectomy      Left  . Abdominal hysterectomy      BSO as well  . Reconstruction breast w/ latissimus dorsi flap    . Orif shoulder fracture  05/27/2012    Procedure: OPEN REDUCTION INTERNAL FIXATION (ORIF) SHOULDER FRACTURE;  Surgeon: Johnny Bridge,  MD;  Location: [facility];  Service: Orthopedics;  Laterality: Right;  . Total shoulder arthroplasty  05/27/2012    Procedure: TOTAL SHOULDER ARTHROPLASTY;  Surgeon: Johnny Bridge, MD;  Location: [facility];  Service: Orthopedics;  Laterality: Right;   Family History  Problem Relation Age of Onset  . Coronary artery disease      family hx of  . Diabetes      family hx of  . Hypertension      family hx of  . Heart disease Mother   . Hypertension Mother   . Stroke Father   . Hypertension Father   . Hypertension Sister    History  Substance Use Topics  . Smoking status: Never Smoker   . Smokeless tobacco: Not on file  . Alcohol Use: No   OB History    No data available     Review of Systems  All other systems reviewed and are negative.     Allergies  Penicillins and Keflex  Home Medications   Prior to Admission medications   Medication Sig Start Date End Date Taking? Authorizing Provider  calcium-vitamin D (OSCAL WITH D) 500-200 MG-UNIT per tablet Take 1 tablet by mouth daily. 05/27/12  Yes Johnny Bridge, MD  glimepiride (AMARYL) 2 MG tablet Take 1 tablet (2 mg total) by mouth daily with breakfast. 12/23/14  Yes [name], MD  levothyroxine (SYNTHROID, LEVOTHROID) 125 MCG tablet  Take 1 tablet (125 mcg total) by mouth daily before breakfast. 12/23/14  Yes [name], MD  lisinopril-hydrochlorothiazide (PRINZIDE,ZESTORETIC) 20-25 MG per tablet Take 1 tablet by mouth daily. 12/23/14  Yes [name], MD  metFORMIN (GLUCOPHAGE XR) 750 MG 24 hr tablet Take 1 tablet (750 mg total) by mouth daily with breakfast. 12/23/14  Yes [name], MD  metoprolol (LOPRESSOR) 50 MG tablet Take 1 tablet (50 mg total) by mouth 2 (two) times daily. 12/23/14  Yes [name], MD  pravastatin (PRAVACHOL) 40 MG tablet Take 1 tablet (40 mg total) by mouth daily. 12/23/14  Yes [name], MD  cephALEXin (KEFLEX) 500 MG capsule Take 1 capsule (500 mg total) by mouth 3 (three)  times daily. Patient not taking: Reported on 12/26/2014 12/25/14   [name], MD   BP 151/86 mmHg  Pulse 110  Temp(Src) 97.7 F (36.5 C) (Oral)  Resp 18  Ht 5\' 6"  (1.676 m)  Wt 210 lb (95.255 kg)  BMI 33.91 kg/m2  SpO2 96% Physical Exam  Constitutional: She appears well-developed and well-nourished. No distress.  HENT:  Head: Normocephalic and atraumatic.  Mouth/Throat: Oropharynx is clear and moist. No oropharyngeal exudate.  Eyes: Conjunctivae and EOM are normal. Pupils are equal, round, and reactive to light. Right eye exhibits no discharge. Left eye exhibits no discharge. No scleral icterus.  Neck: Normal range of motion. Neck supple. No JVD present. No thyromegaly present.  Cardiovascular: Normal rate, regular rhythm, normal heart sounds and intact distal pulses.  Exam reveals no gallop and no friction rub.   No murmur heard. Pulmonary/Chest: Effort normal and breath sounds normal. No respiratory distress. She has no wheezes. She has no rales.  Abdominal: Soft. Bowel sounds are normal. She exhibits no distension and no mass. There is no tenderness.  Musculoskeletal: Normal range of motion. She exhibits edema and tenderness.  Multiple superficial blistering lesions draining clear fluid, no purulent drainage, significant redness from just below the knee through the foot. Blistering lesions to the right and left lateral malleolus areas. Central hematoma to the left anterior medial mid lower extremity. Minimal tenderness to the leg throughout. No subcutaneous emphysema palpated.  Lymphadenopathy:    She has no cervical adenopathy.  Neurological: She is alert. Coordination normal.  Skin: Skin is warm and dry. No rash noted. No erythema.  Psychiatric: She has a normal mood and affect. Her behavior is normal.  Nursing note and vitals reviewed.   ED Course  Procedures (including critical care time) Labs Review Labs Reviewed  CBC WITH DIFFERENTIAL - Abnormal; Notable for the  following:    WBC 14.3 (*)    Neutro Abs 9.9 (*)    Eosinophils Relative 9 (*)    Eosinophils Absolute 1.3 (*)    All other components within normal limits  I-STAT CHEM 8, ED - Abnormal; Notable for the following:    BUN 48 (*)    Creatinine, Ser 1.40 (*)    Glucose, Bld 164 (*)    All other components within normal limits    Imaging Review No results found.    MDM   Final diagnoses:  Swelling  Cellulitis of left lower extremity  Hematoma    Swelling present to the left lower extremity, bedside ultrasound confirms that she has a deep tissue infection including a fluid collection as well as evidence of cellulitis throughout. She will need incision and drainage, x-ray to rule out free air or subcutaneous air and admission to  the hospital for failed outpatient therapy of what appears to be a lower extremity infection of the soft tissues. Will give IV vancomycin, labs, anticipate admission.  Procedure Note:  Ultrasound soft tissue performed by Johnna Acosta, MD  Lower extremity Pain and Soft tissue infection Abcess present and Cellulitis present Abcess present and Cellulitis present  I personally performed the ultrasound and interpreted the results.  White blood cell count elevated at 14,000, renal function creatinine 1.4 and BUN of 48. Performed incision and drainage with large amount of hematoma that was expressed from the wound. Sterile dressing ordered for nursing to place, antibiotics ordered, discussed with Dr. Maudie Mercury who will admit.  INCISION AND DRAINAGE Performed by: Noemi Chapel D Consent: Verbal consent obtained. Risks and benefits: risks, benefits and alternatives were discussed Type: abscess  Body area: Left lower extremity  Anesthesia: local infiltration  Incision was made with a scalpel.  Local anesthetic: lidocaine 1 % with epinephrine  Anesthetic total: 2 ml  Complexity: complex Blunt dissection to break up loculations  Drainage: Bloody, hematoma  present   Drainage amount: Large   Packing material: None  Patient tolerance: Patient tolerated the procedure well with no immediate complications.   Meds given in ED:  Medications  lidocaine-EPINEPHrine (XYLOCAINE W/EPI) 1 %-1:100000 (with pres) injection 10 mL (not administered)  vancomycin (VANCOCIN) IVPB 1000 mg/200 mL premix (not administered)      Johnna Acosta, MD 12/26/14 2319

## 2014-12-26 NOTE — ED Notes (Signed)
Pt st's she tripped on steps 2 weeks ago.  Now pt has open wound with blisters, redness and swelling to left lower leg.

## 2014-12-27 DIAGNOSIS — E038 Other specified hypothyroidism: Secondary | ICD-10-CM

## 2014-12-27 DIAGNOSIS — I10 Essential (primary) hypertension: Secondary | ICD-10-CM

## 2014-12-27 DIAGNOSIS — L03116 Cellulitis of left lower limb: Principal | ICD-10-CM

## 2014-12-27 DIAGNOSIS — E785 Hyperlipidemia, unspecified: Secondary | ICD-10-CM

## 2014-12-27 DIAGNOSIS — L0390 Cellulitis, unspecified: Secondary | ICD-10-CM | POA: Diagnosis present

## 2014-12-27 DIAGNOSIS — E1165 Type 2 diabetes mellitus with hyperglycemia: Secondary | ICD-10-CM

## 2014-12-27 LAB — CBC WITH DIFFERENTIAL/PLATELET
Basophils Absolute: 0 10*3/uL (ref 0.0–0.1)
Basophils Relative: 0 % (ref 0–1)
Eosinophils Absolute: 1.2 10*3/uL — ABNORMAL HIGH (ref 0.0–0.7)
Eosinophils Relative: 10 % — ABNORMAL HIGH (ref 0–5)
HEMATOCRIT: 37.4 % (ref 36.0–46.0)
HEMOGLOBIN: 12.2 g/dL (ref 12.0–15.0)
LYMPHS ABS: 2.4 10*3/uL (ref 0.7–4.0)
Lymphocytes Relative: 19 % (ref 12–46)
MCH: 27.3 pg (ref 26.0–34.0)
MCHC: 32.6 g/dL (ref 30.0–36.0)
MCV: 83.7 fL (ref 78.0–100.0)
Monocytes Absolute: 0.6 10*3/uL (ref 0.1–1.0)
Monocytes Relative: 5 % (ref 3–12)
Neutro Abs: 8.3 10*3/uL — ABNORMAL HIGH (ref 1.7–7.7)
Neutrophils Relative %: 66 % (ref 43–77)
PLATELETS: 321 10*3/uL (ref 150–400)
RBC: 4.47 MIL/uL (ref 3.87–5.11)
RDW: 12.8 % (ref 11.5–15.5)
WBC: 12.6 10*3/uL — AB (ref 4.0–10.5)

## 2014-12-27 LAB — COMPREHENSIVE METABOLIC PANEL
ALBUMIN: 3.2 g/dL — AB (ref 3.5–5.2)
ALT: 16 U/L (ref 0–35)
ANION GAP: 7 (ref 5–15)
AST: 17 U/L (ref 0–37)
Alkaline Phosphatase: 111 U/L (ref 39–117)
BUN: 41 mg/dL — ABNORMAL HIGH (ref 6–23)
CO2: 23 mmol/L (ref 19–32)
CREATININE: 1.25 mg/dL — AB (ref 0.50–1.10)
Calcium: 8.5 mg/dL (ref 8.4–10.5)
Chloride: 104 mEq/L (ref 96–112)
GFR calc Af Amer: 47 mL/min — ABNORMAL LOW (ref >90)
GFR calc non Af Amer: 41 mL/min — ABNORMAL LOW (ref >90)
Glucose, Bld: 247 mg/dL — ABNORMAL HIGH (ref 70–99)
Potassium: 3.7 mmol/L (ref 3.5–5.1)
Sodium: 134 mmol/L — ABNORMAL LOW (ref 135–145)
TOTAL PROTEIN: 5.7 g/dL — AB (ref 6.0–8.3)
Total Bilirubin: 0.5 mg/dL (ref 0.3–1.2)

## 2014-12-27 LAB — HEMOGLOBIN A1C
Hgb A1c MFr Bld: 9.9 % — ABNORMAL HIGH (ref <5.7)
Mean Plasma Glucose: 237 mg/dL — ABNORMAL HIGH (ref <117)

## 2014-12-27 LAB — GLUCOSE, CAPILLARY
GLUCOSE-CAPILLARY: 238 mg/dL — AB (ref 70–99)
Glucose-Capillary: 214 mg/dL — ABNORMAL HIGH (ref 70–99)
Glucose-Capillary: 237 mg/dL — ABNORMAL HIGH (ref 70–99)

## 2014-12-27 LAB — SEDIMENTATION RATE: Sed Rate: 11 mm/hr (ref 0–22)

## 2014-12-27 MED ORDER — LISINOPRIL-HYDROCHLOROTHIAZIDE 20-25 MG PO TABS: 1.0000 | ORAL_TABLET | Freq: Every day | ORAL | Status: DC

## 2014-12-27 MED ORDER — INSULIN ASPART 100 UNIT/ML ~~LOC~~ SOLN: 0.0000 [IU] | Freq: Every day | SUBCUTANEOUS | Status: DC

## 2014-12-27 MED ORDER — INSULIN ASPART 100 UNIT/ML ~~LOC~~ SOLN
0.0000 [IU] | SUBCUTANEOUS | Status: DC
  Administered 2014-12-27: 3 [IU] via SUBCUTANEOUS
  Administered 2014-12-27 – 2014-12-28 (×2): 2 [IU] via SUBCUTANEOUS
  Administered 2014-12-28 (×2): 5 [IU] via SUBCUTANEOUS
  Administered 2014-12-28: 2 [IU] via SUBCUTANEOUS
  Administered 2014-12-29: 3 [IU] via SUBCUTANEOUS
  Administered 2014-12-29: 5 [IU] via SUBCUTANEOUS
  Administered 2014-12-29 – 2014-12-30 (×2): 2 [IU] via SUBCUTANEOUS
  Administered 2014-12-30 (×2): 3 [IU] via SUBCUTANEOUS
  Administered 2014-12-30: 5 [IU] via SUBCUTANEOUS
  Administered 2014-12-30: 3 [IU] via SUBCUTANEOUS
  Administered 2014-12-31: 2 [IU] via SUBCUTANEOUS
  Administered 2014-12-31 (×2): 3 [IU] via SUBCUTANEOUS

## 2014-12-27 MED ORDER — VANCOMYCIN HCL 10 G IV SOLR
1250.0000 mg | INTRAVENOUS | Status: DC
  Administered 2014-12-27 – 2014-12-31 (×4): 1250 mg via INTRAVENOUS
  Filled 2014-12-27 (×6): qty 1250

## 2014-12-27 MED ORDER — ONDANSETRON HCL 4 MG/2ML IJ SOLN: 4.0000 mg | Freq: Three times a day (TID) | INTRAMUSCULAR | Status: AC | PRN

## 2014-12-27 MED ORDER — INSULIN ASPART 100 UNIT/ML ~~LOC~~ SOLN
0.0000 [IU] | Freq: Three times a day (TID) | SUBCUTANEOUS | Status: DC
  Administered 2014-12-27: 3 [IU] via SUBCUTANEOUS

## 2014-12-27 MED ORDER — HYDROCHLOROTHIAZIDE 25 MG PO TABS
25.0000 mg | ORAL_TABLET | Freq: Every day | ORAL | Status: DC
  Administered 2014-12-27 – 2014-12-31 (×5): 25 mg via ORAL
  Filled 2014-12-27 (×5): qty 1

## 2014-12-27 MED ORDER — ACETAMINOPHEN 325 MG PO TABS: 650.0000 mg | ORAL_TABLET | Freq: Four times a day (QID) | ORAL | Status: DC | PRN

## 2014-12-27 MED ORDER — METOPROLOL TARTRATE 50 MG PO TABS
50.0000 mg | ORAL_TABLET | Freq: Two times a day (BID) | ORAL | Status: DC
  Administered 2014-12-27 – 2014-12-31 (×9): 50 mg via ORAL
  Filled 2014-12-27 (×10): qty 1

## 2014-12-27 MED ORDER — LEVOTHYROXINE SODIUM 125 MCG PO TABS
125.0000 ug | ORAL_TABLET | Freq: Every day | ORAL | Status: DC
  Administered 2014-12-27: 125 ug via ORAL
  Filled 2014-12-27 (×2): qty 1

## 2014-12-27 MED ORDER — LISINOPRIL 20 MG PO TABS
20.0000 mg | ORAL_TABLET | Freq: Every day | ORAL | Status: DC
  Administered 2014-12-27 – 2014-12-31 (×5): 20 mg via ORAL
  Filled 2014-12-27 (×5): qty 1

## 2014-12-27 MED ORDER — CALCIUM CARBONATE-VITAMIN D 500-200 MG-UNIT PO TABS
1.0000 | ORAL_TABLET | Freq: Every day | ORAL | Status: DC
  Administered 2014-12-27 – 2014-12-31 (×5): 1 via ORAL
  Filled 2014-12-27 (×5): qty 1

## 2014-12-27 MED ORDER — PRAVASTATIN SODIUM 40 MG PO TABS
40.0000 mg | ORAL_TABLET | Freq: Every day | ORAL | Status: DC
  Administered 2014-12-27: 40 mg via ORAL
  Filled 2014-12-27 (×2): qty 1

## 2014-12-27 MED ORDER — METFORMIN HCL ER 750 MG PO TB24
750.0000 mg | ORAL_TABLET | Freq: Every day | ORAL | Status: DC
  Administered 2014-12-27 – 2014-12-29 (×3): 750 mg via ORAL
  Filled 2014-12-27 (×4): qty 1

## 2014-12-27 MED ORDER — SODIUM CHLORIDE 0.9 % IJ SOLN
3.0000 mL | Freq: Two times a day (BID) | INTRAMUSCULAR | Status: DC
  Administered 2014-12-28 – 2014-12-30 (×3): 3 mL via INTRAVENOUS

## 2014-12-27 MED ORDER — GLIMEPIRIDE 2 MG PO TABS
2.0000 mg | ORAL_TABLET | Freq: Every day | ORAL | Status: DC
  Administered 2014-12-27 – 2014-12-28 (×2): 2 mg via ORAL
  Filled 2014-12-27 (×3): qty 1

## 2014-12-27 MED ORDER — LEVOFLOXACIN IN D5W 750 MG/150ML IV SOLN
750.0000 mg | INTRAVENOUS | Status: DC
  Administered 2014-12-27 – 2014-12-29 (×2): 750 mg via INTRAVENOUS
  Filled 2014-12-27 (×2): qty 150

## 2014-12-27 MED ORDER — ACETAMINOPHEN 650 MG RE SUPP: 650.0000 mg | Freq: Four times a day (QID) | RECTAL | Status: DC | PRN

## 2014-12-27 MED ORDER — LEVOTHYROXINE SODIUM 150 MCG PO TABS
150.0000 ug | ORAL_TABLET | Freq: Every day | ORAL | Status: DC
  Administered 2014-12-28 – 2014-12-30 (×3): 150 ug via ORAL
  Filled 2014-12-27 (×4): qty 1

## 2014-12-27 MED ORDER — SODIUM CHLORIDE 0.9 % IV SOLN
INTRAVENOUS | Status: AC
  Administered 2014-12-27: 01:00:00 via INTRAVENOUS

## 2014-12-27 MED ORDER — DIPHENHYDRAMINE HCL 25 MG PO CAPS
25.0000 mg | ORAL_CAPSULE | Freq: Four times a day (QID) | ORAL | Status: DC | PRN
  Administered 2014-12-27 – 2014-12-30 (×10): 25 mg via ORAL
  Filled 2014-12-27 (×11): qty 1

## 2014-12-27 NOTE — Progress Notes (Signed)
Attempted report 

## 2014-12-27 NOTE — ED Notes (Signed)
Attempted report 

## 2014-12-27 NOTE — Progress Notes (Addendum)
[facility] PROGRESS NOTE  Michaela Rhodes:[id number] DOB: [birth year] DOA: 12/26/2014 PCP: [name], MD  Assessment/Plan:  Cellulitis -Culture pending, appreciate ER input.  -Start on iv vanco and iv levaquin -Blood culture x2 sets pending -Pack incision site with plain iodoform, change packing daily  ARF:  -Mild renal insufficiency -Continue normal saline 100 ml/hr  Diabetes type 2 uncontrolled -1/4 hemoglobin A1c= 10.1 -Patient is on 2 hyperglycemic medications and has failed to meet the ADA recommended hemoglobin A1c<7 -Control with moderate SSI -Will continue home dose of metformin, Amaryl, in a.m. will add Lantus after obtaining amount of insulin required overnight  HLD -Lipid panel shows that LDL> 70 mg/dL recommended by the ADA guidelines -Increase pravastatin to 60 mg daily  Hypothyroidism -1/4 TSH= 9.04 -Increase Synthroid to 150 g daily  Hypertension:  - Continue HCTZ 25 mg daily -continue lisinopril 20 mg daily    Code Status: Full  Family Communication: None Disposition Plan: Resolution abscess   Consultants: Dr. Hyacinth Meeker   Procedures: 1/8 I & D of left lower extremity abscess   Cultures None  Antibiotics: Levofloxacin 1/9>> Vancomycin 1/9>>    HPI/Subjective: 79 yo WF PMHx DM type 2, HTN, SVT, hyperlipidemia, hypothyroidism c/o left leg redness and swelling x 1 week. Pt was tx with keflex which gave her itching. Pt had u/s at orthopedics which was negative for DVT. Pt presented to the ED for evaluation and Dr. Hyacinth Meeker performed and I and D. And found blood. No pus. Denies fever, chills. Pt will be admitted for cellulitis.     Objective: Filed Vitals:   12/26/14 2345 12/27/14 0000 12/27/14 0100 12/27/14 0502  BP: 109/43 119/63 129/64 122/54  Pulse: 74 80 78 70  Temp:   97.9 F (36.6 C) 97.6 F (36.4 C)  TempSrc:   Oral Oral  Resp:   18 18  Height:   5\' 6"  (1.676 m)   Weight:   96.8 kg (213 lb 6.5 oz) 96.6  kg (212 lb 15.4 oz)  SpO2: 99% 100% 100% 99%    Intake/Output Summary (Last 24 hours) at 12/27/14 1430 Last data filed at 12/27/14 1235  Gross per 24 hour  Intake    240 ml  Output      0 ml  Net    240 ml   Filed Weights   12/26/14 1727 12/27/14 0100 12/27/14 0502  Weight: 95.255 kg (210 lb) 96.8 kg (213 lb 6.5 oz) 96.6 kg (212 lb 15.4 oz)     Exam: General: A/O 4, NAD, No acute respiratory distress Lungs: Clear to auscultation bilaterally without wheezes or crackles Cardiovascular: Regular rate and rhythm without murmur gallop or rub normal S1 and S2 Abdomen: Nontender, nondistended, soft, bowel sounds positive, no rebound, no ascites, no appreciable mass Extremities: No significant cyanosis, clubbing, or edema right lower extremities. Left lower extremity has a open incision where I and D was performed, copious serosanguineous drainage; abscess tract is approximately 5 cm at its longest from the incision opening   Data Reviewed: Basic Metabolic Panel:  Recent Labs Lab 12/22/14 0940 12/26/14 2253 12/27/14 0507  NA 139 141 134*  K 5.0 4.3 3.7  CL 102 103 104  CO2 26  --  23  GLUCOSE 308* 164* 247*  BUN 41* 48* 41*  CREATININE 1.4* 1.40* 1.25*  CALCIUM 9.6  --  8.5   Liver Function Tests:  Recent Labs Lab 12/22/14 0940 12/27/14 0507  AST 15 17  ALT 15 16  ALKPHOS 129* 111  BILITOT 0.6 0.5  PROT 7.1 5.7*  ALBUMIN 4.0 3.2*   No results for input(s): LIPASE, AMYLASE in the last 168 hours. No results for input(s): AMMONIA in the last 168 hours. CBC:  Recent Labs Lab 12/26/14 2236 12/26/14 2253 12/27/14 0507  WBC 14.3*  --  12.6*  NEUTROABS 9.9*  --  8.3*  HGB 13.5 14.6 12.2  HCT 40.6 43.0 37.4  MCV 84.9  --  83.7  PLT 340  --  321   Cardiac Enzymes: No results for input(s): CKTOTAL, CKMB, CKMBINDEX, TROPONINI in the last 168 hours. BNP (last 3 results) No results for input(s): PROBNP in the last 8760 hours. CBG:  Recent Labs Lab  12/27/14 0111 12/27/14 0654 12/27/14 1141  GLUCAP 214* 237* 238*    No results found for this or any previous visit (from the past 240 hour(s)).   Studies: Dg Tibia/fibula Left  12/26/2014   CLINICAL DATA:  Fall up stairs 2 weeks ago, injuring left tib-fib. Swelling that was drained. Open wound on the mid anterior part of the left tib-fib. Blisters on the lateral part of the ankle. Redness.  EXAM: LEFT TIBIA AND FIBULA - 2 VIEW  COMPARISON:  None.  FINDINGS: Diffusely increased density throughout the soft tissues suggesting soft tissue edema. Nodular soft tissue prominence in the ankle region suggesting skin lesions. Correlation with physical examination is suggested. Focal soft tissue gas collection anterior to the tibia at the mid/ distal shaft region. This may be sequela of recent drainage procedure or may indicate soft tissue infection. Underlying bones appear intact. No evidence of bone destruction or cortical loss to suggest osteomyelitis. No evidence of acute fracture or dislocation. Degenerative changes in the left knee.  IMPRESSION: Diffuse soft tissue edema in the left lower leg. Focal gas collections adjacent to the left anterior medial tibial midshaft may represent postprocedural change or soft tissue infection. Multiple skin lesions suggested at the ankles. No acute bony abnormalities.   Electronically Signed   By: Burman Nieves M.D.   On: 12/26/2014 23:55    Scheduled Meds: . calcium-vitamin D  1 tablet Oral Daily  . glimepiride  2 mg Oral Q breakfast  . hydrochlorothiazide  25 mg Oral Daily  . insulin aspart  0-5 Units Subcutaneous QHS  . insulin aspart  0-9 Units Subcutaneous TID WC  . levofloxacin (LEVAQUIN) IV  750 mg Intravenous Q48H  . levothyroxine  125 mcg Oral QAC breakfast  . lisinopril  20 mg Oral Daily  . metFORMIN  750 mg Oral Q breakfast  . metoprolol  50 mg Oral BID  . pravastatin  40 mg Oral Daily  . sodium chloride  3 mL Intravenous Q12H  . vancomycin   1,250 mg Intravenous Q24H   Continuous Infusions:   Active Problems:   Essential hypertension   Hyperglycemia   Hyperlipidemia   Cellulitis of left lower extremity   Cellulitis    Time spent: 40 minutes  [name], J  [facility] Pager [phone]. If 7PM-7AM, please contact night-coverage at [email or web address], password [facility] 12/27/2014, 2:30 PM  LOS: 1 day

## 2014-12-27 NOTE — Progress Notes (Signed)
ANTIBIOTIC CONSULT NOTE - INITIAL  Pharmacy Consult for Vancomycin/Levaquin  Indication: Cellulitis  Allergies  Allergen Reactions  . Penicillins Anaphylaxis and Hives  . Keflex [Cephalexin]     Rash     Patient Measurements: Height: 5\' 6"  (167.6 cm) Weight: 213 lb 6.5 oz (96.8 kg) IBW/kg (Calculated) : 59.3  Vital Signs: Temp: 97.9 F (36.6 C) (01/09 0100) Temp Source: Oral (01/09 0100) BP: 129/64 mmHg (01/09 0100) Pulse Rate: 78 (01/09 0100)  Labs:  Recent Labs  12/26/14 2236 12/26/14 2253  WBC 14.3*  --   HGB 13.5 14.6  PLT 340  --   CREATININE  --  1.40*   Estimated Creatinine Clearance: 40.1 mL/min (by C-G formula based on Cr of 1.4).  Medical History: Past Medical History  Diagnosis Date  . Hypertension   . Hypothyroidism   . Panic attacks     mild  . SVT (supraventricular tachycardia)     in the past  . DM2 (diabetes mellitus, type 2)   . Hyperlipidemia    Assessment: Starting vancomycin/levaquin per pharmacy for left leg cellulitis. Mild leukocytosis present, mild renal dysfunction with CrCl ~40, other labs as above.   Goal of Therapy:  Vancomycin trough level 10-15 mcg/ml  Plan:  -Vancomycin 1250 mg IV q24h -Levaquin 750 mg IV q48h, may need dose change if SCr improves -Trend WBC, temp, renal function, cultures -Drug levels as indicated   Michaela Rhodes 12/27/2014,1:28 AM

## 2014-12-27 NOTE — Progress Notes (Signed)
Attempted report x 2 

## 2014-12-27 NOTE — Progress Notes (Signed)
Utilization review completed.  

## 2014-12-28 ENCOUNTER — Inpatient Hospital Stay (HOSPITAL_COMMUNITY): Payer: Commercial Managed Care - HMO

## 2014-12-28 DIAGNOSIS — E1165 Type 2 diabetes mellitus with hyperglycemia: Secondary | ICD-10-CM | POA: Insufficient documentation

## 2014-12-28 DIAGNOSIS — E038 Other specified hypothyroidism: Secondary | ICD-10-CM | POA: Insufficient documentation

## 2014-12-28 DIAGNOSIS — N179 Acute kidney failure, unspecified: Secondary | ICD-10-CM | POA: Insufficient documentation

## 2014-12-28 LAB — GLUCOSE, CAPILLARY
GLUCOSE-CAPILLARY: 104 mg/dL — AB (ref 70–99)
GLUCOSE-CAPILLARY: 124 mg/dL — AB (ref 70–99)
GLUCOSE-CAPILLARY: 154 mg/dL — AB (ref 70–99)
GLUCOSE-CAPILLARY: 87 mg/dL (ref 70–99)
Glucose-Capillary: 138 mg/dL — ABNORMAL HIGH (ref 70–99)
Glucose-Capillary: 128 mg/dL — ABNORMAL HIGH (ref 70–99)
Glucose-Capillary: 138 mg/dL — ABNORMAL HIGH (ref 70–99)
Glucose-Capillary: 150 mg/dL — ABNORMAL HIGH (ref 70–99)
Glucose-Capillary: 205 mg/dL — ABNORMAL HIGH (ref 70–99)
Glucose-Capillary: 218 mg/dL — ABNORMAL HIGH (ref 70–99)

## 2014-12-28 LAB — COMPREHENSIVE METABOLIC PANEL
ALBUMIN: 2.9 g/dL — AB (ref 3.5–5.2)
ALT: 13 U/L (ref 0–35)
AST: 18 U/L (ref 0–37)
Alkaline Phosphatase: 101 U/L (ref 39–117)
Anion gap: 9 (ref 5–15)
BILIRUBIN TOTAL: 0.5 mg/dL (ref 0.3–1.2)
BUN: 31 mg/dL — AB (ref 6–23)
CALCIUM: 8.8 mg/dL (ref 8.4–10.5)
CO2: 23 mmol/L (ref 19–32)
Chloride: 106 mEq/L (ref 96–112)
Creatinine, Ser: 1.32 mg/dL — ABNORMAL HIGH (ref 0.50–1.10)
GFR calc Af Amer: 44 mL/min — ABNORMAL LOW (ref >90)
GFR, EST NON AFRICAN AMERICAN: 38 mL/min — AB (ref >90)
Glucose, Bld: 226 mg/dL — ABNORMAL HIGH (ref 70–99)
Potassium: 4 mmol/L (ref 3.5–5.1)
Sodium: 138 mmol/L (ref 135–145)
Total Protein: 5.2 g/dL — ABNORMAL LOW (ref 6.0–8.3)

## 2014-12-28 LAB — CBC WITH DIFFERENTIAL/PLATELET
Basophils Absolute: 0 10*3/uL (ref 0.0–0.1)
Basophils Relative: 0 % (ref 0–1)
Eosinophils Absolute: 1.2 10*3/uL — ABNORMAL HIGH (ref 0.0–0.7)
Eosinophils Relative: 10 % — ABNORMAL HIGH (ref 0–5)
HCT: 38.1 % (ref 36.0–46.0)
Hemoglobin: 12.4 g/dL (ref 12.0–15.0)
Lymphocytes Relative: 16 % (ref 12–46)
Lymphs Abs: 1.8 10*3/uL (ref 0.7–4.0)
MCH: 27.6 pg (ref 26.0–34.0)
MCHC: 32.5 g/dL (ref 30.0–36.0)
MCV: 84.7 fL (ref 78.0–100.0)
MONO ABS: 0.6 10*3/uL (ref 0.1–1.0)
MONOS PCT: 5 % (ref 3–12)
Neutro Abs: 8 10*3/uL — ABNORMAL HIGH (ref 1.7–7.7)
Neutrophils Relative %: 69 % (ref 43–77)
PLATELETS: 263 10*3/uL (ref 150–400)
RBC: 4.5 MIL/uL (ref 3.87–5.11)
RDW: 12.9 % (ref 11.5–15.5)
WBC: 11.6 10*3/uL — ABNORMAL HIGH (ref 4.0–10.5)

## 2014-12-28 LAB — MAGNESIUM: Magnesium: 1.6 mg/dL (ref 1.5–2.5)

## 2014-12-28 MED ORDER — PRAVASTATIN SODIUM 40 MG PO TABS
60.0000 mg | ORAL_TABLET | Freq: Every day | ORAL | Status: DC
Start: 2014-12-28 — End: 2014-12-31
  Administered 2014-12-28 – 2014-12-31 (×4): 60 mg via ORAL
  Filled 2014-12-28 (×4): qty 1

## 2014-12-28 MED ORDER — SODIUM CHLORIDE 0.9 % IV SOLN
INTRAVENOUS | Status: DC
  Administered 2014-12-28 (×3): via INTRAVENOUS

## 2014-12-28 MED ORDER — INSULIN GLARGINE 100 UNIT/ML ~~LOC~~ SOLN
8.0000 [IU] | Freq: Every day | SUBCUTANEOUS | Status: DC
  Administered 2014-12-28 – 2014-12-29 (×2): 8 [IU] via SUBCUTANEOUS
  Filled 2014-12-28 (×2): qty 0.08

## 2014-12-28 NOTE — Progress Notes (Signed)
[facility] PROGRESS NOTE  Michaela Rhodes WNU:[id number] DOB: October 23, 1936 DOA: 12/26/2014 PCP: [name], MD  Assessment/Plan:  Cellulitis -Culture pending, -Continue iv vanco and iv levaquin -Blood culture x2 sets pending -Wound culture pending -Pack incision site with plain iodoform, change packing daily -Due to patient's comorbidities will obtain MRI of left lower extremity R/O osteomyelitis  ARF:  -Mild renal insufficiency -Continue normal saline 100 ml/hr  Diabetes type 2 uncontrolled -1/4 hemoglobin A1c= 10.1 -Patient is on 2 hyperglycemic medications and has failed to meet the ADA recommended hemoglobin A1c<7 -Control with moderate SSI -Start Lantus 8 units daily  -Continue metformin XR 750 mg daily, discontinue Amaryl  HLD -Lipid panel shows that LDL> 70 mg/dL recommended by the ADA guidelines -Increase pravastatin to 60 mg daily  Hypothyroidism -1/4 TSH= 9.04 -Increase Synthroid to 150 g daily  Hypertension:  - Continue HCTZ 25 mg daily -continue lisinopril 20 mg daily    Code Status: Full  Family Communication: None Disposition Plan: Resolution abscess   Consultants: Dr. Sabra Heck   Procedures: 1/8 I & D of left lower extremity abscess   Cultures None  Antibiotics: Levofloxacin 1/9>> Vancomycin 1/9>>    HPI/Subjective: 79 yo WF PMHx DM type 2, HTN, SVT, hyperlipidemia, hypothyroidism c/o left leg redness and swelling x 1 week. Pt was tx with keflex which gave her itching. Pt had u/s at orthopedics which was negative for DVT. Pt presented to the ED for evaluation and Dr. Sabra Heck performed and I and D. And found blood. No pus. Denies fever, chills. Pt will be admitted for cellulitis.  1/10 A/O x 4 sitting in chair comfortably, states does have pain with ambulation    Objective: Filed Vitals:   12/27/14 0502 12/27/14 1704 12/27/14 2019 12/28/14 0540  BP: 122/54 126/74 113/62 128/78  Pulse: 70 96 113 91  Temp: 97.6 F  (36.4 C) 98.3 F (36.8 C) 98.6 F (37 C) 98.1 F (36.7 C)  TempSrc: Oral Oral Oral Oral  Resp: 18 18 18 18   Height:      Weight: 96.6 kg (212 lb 15.4 oz)   96.879 kg (213 lb 9.3 oz)  SpO2: 99% 96% 94% 94%    Intake/Output Summary (Last 24 hours) at 12/28/14 0942 Last data filed at 12/28/14 0650  Gross per 24 hour  Intake   1320 ml  Output      0 ml  Net   1320 ml   Filed Weights   12/27/14 0100 12/27/14 0502 12/28/14 0540  Weight: 96.8 kg (213 lb 6.5 oz) 96.6 kg (212 lb 15.4 oz) 96.879 kg (213 lb 9.3 oz)     Exam: General: A/O 4, NAD, No acute respiratory distress Lungs: Clear to auscultation bilaterally without wheezes or crackles Cardiovascular: Regular rate and rhythm without murmur gallop or rub normal S1 and S2 Abdomen: Nontender, nondistended, soft, bowel sounds positive, no rebound, no ascites, no appreciable mass Extremities: No significant cyanosis, clubbing, or edema right lower extremities. Left lower extremity has a open incision where I and D was performed, copious serosanguineous drainage; abscess tract is approximately 5 cm at its longest from the incision opening   Data Reviewed: Basic Metabolic Panel:  Recent Labs Lab 12/22/14 0940 12/26/14 2253 12/27/14 0507  NA 139 141 134*  K 5.0 4.3 3.7  CL 102 103 104  CO2 26  --  23  GLUCOSE 308* 164* 247*  BUN 41* 48* 41*  CREATININE 1.4* 1.40* 1.25*  CALCIUM 9.6  --  8.5  Liver Function Tests:  Recent Labs Lab 12/22/14 0940 12/27/14 0507  AST 15 17  ALT 15 16  ALKPHOS 129* 111  BILITOT 0.6 0.5  PROT 7.1 5.7*  ALBUMIN 4.0 3.2*   No results for input(s): LIPASE, AMYLASE in the last 168 hours. No results for input(s): AMMONIA in the last 168 hours. CBC:  Recent Labs Lab 12/26/14 2236 12/26/14 2253 12/27/14 0507  WBC 14.3*  --  12.6*  NEUTROABS 9.9*  --  8.3*  HGB 13.5 14.6 12.2  HCT 40.6 43.0 37.4  MCV 84.9  --  83.7  PLT 340  --  321   Cardiac Enzymes: No results for input(s):  CKTOTAL, CKMB, CKMBINDEX, TROPONINI in the last 168 hours. BNP (last 3 results) No results for input(s): PROBNP in the last 8760 hours. CBG:  Recent Labs Lab 12/27/14 2204 12/28/14 0016 12/28/14 0226 12/28/14 0410 12/28/14 0900  GLUCAP 128* 104* 138* 150* 205*    No results found for this or any previous visit (from the past 240 hour(s)).   Studies: Dg Tibia/fibula Left  12/26/2014   CLINICAL DATA:  Fall up stairs 2 weeks ago, injuring left tib-fib. Swelling that was drained. Open wound on the mid anterior part of the left tib-fib. Blisters on the lateral part of the ankle. Redness.  EXAM: LEFT TIBIA AND FIBULA - 2 VIEW  COMPARISON:  None.  FINDINGS: Diffusely increased density throughout the soft tissues suggesting soft tissue edema. Nodular soft tissue prominence in the ankle region suggesting skin lesions. Correlation with physical examination is suggested. Focal soft tissue gas collection anterior to the tibia at the mid/ distal shaft region. This may be sequela of recent drainage procedure or may indicate soft tissue infection. Underlying bones appear intact. No evidence of bone destruction or cortical loss to suggest osteomyelitis. No evidence of acute fracture or dislocation. Degenerative changes in the left knee.  IMPRESSION: Diffuse soft tissue edema in the left lower leg. Focal gas collections adjacent to the left anterior medial tibial midshaft may represent postprocedural change or soft tissue infection. Multiple skin lesions suggested at the ankles. No acute bony abnormalities.   Electronically Signed   By: Lucienne Capers M.D.   On: 12/26/2014 23:55    Scheduled Meds: . calcium-vitamin D  1 tablet Oral Daily  . glimepiride  2 mg Oral Q breakfast  . hydrochlorothiazide  25 mg Oral Daily  . insulin aspart  0-15 Units Subcutaneous 6 times per day  . levofloxacin (LEVAQUIN) IV  750 mg Intravenous Q48H  . levothyroxine  150 mcg Oral QAC breakfast  . lisinopril  20 mg Oral Daily   . metFORMIN  750 mg Oral Q breakfast  . metoprolol  50 mg Oral BID  . pravastatin  60 mg Oral Daily  . sodium chloride  3 mL Intravenous Q12H  . vancomycin  1,250 mg Intravenous Q24H   Continuous Infusions: . sodium chloride 100 mL/hr at 12/28/14 2355    Active Problems:   Essential hypertension   Hyperglycemia   Hyperlipidemia   Cellulitis of left lower extremity   Cellulitis   Diabetes type 2, uncontrolled   Other specified hypothyroidism    Time spent: 40 minutes  WOODS, CURTIS, J  [facility] Pager [phone]. If 7PM-7AM, please contact night-coverage at [email or web address], password [facility] 12/28/2014, 9:42 AM  LOS: 2 days

## 2014-12-29 LAB — COMPREHENSIVE METABOLIC PANEL
ALBUMIN: 2.6 g/dL — AB (ref 3.5–5.2)
ALT: 12 U/L (ref 0–35)
AST: 16 U/L (ref 0–37)
Alkaline Phosphatase: 92 U/L (ref 39–117)
Anion gap: 4 — ABNORMAL LOW (ref 5–15)
BUN: 27 mg/dL — ABNORMAL HIGH (ref 6–23)
CHLORIDE: 109 meq/L (ref 96–112)
CO2: 24 mmol/L (ref 19–32)
CREATININE: 1.09 mg/dL (ref 0.50–1.10)
Calcium: 8.1 mg/dL — ABNORMAL LOW (ref 8.4–10.5)
GFR calc non Af Amer: 48 mL/min — ABNORMAL LOW (ref >90)
GFR, EST AFRICAN AMERICAN: 56 mL/min — AB (ref >90)
Glucose, Bld: 102 mg/dL — ABNORMAL HIGH (ref 70–99)
POTASSIUM: 3.9 mmol/L (ref 3.5–5.1)
SODIUM: 137 mmol/L (ref 135–145)
Total Bilirubin: 0.5 mg/dL (ref 0.3–1.2)
Total Protein: 4.8 g/dL — ABNORMAL LOW (ref 6.0–8.3)

## 2014-12-29 LAB — CBC WITH DIFFERENTIAL/PLATELET
BASOS PCT: 0 % (ref 0–1)
Basophils Absolute: 0 10*3/uL (ref 0.0–0.1)
EOS ABS: 1.2 10*3/uL — AB (ref 0.0–0.7)
Eosinophils Relative: 11 % — ABNORMAL HIGH (ref 0–5)
HCT: 36.7 % (ref 36.0–46.0)
HEMOGLOBIN: 11.8 g/dL — AB (ref 12.0–15.0)
Lymphocytes Relative: 17 % (ref 12–46)
Lymphs Abs: 1.8 10*3/uL (ref 0.7–4.0)
MCH: 27.1 pg (ref 26.0–34.0)
MCHC: 32.2 g/dL (ref 30.0–36.0)
MCV: 84.4 fL (ref 78.0–100.0)
MONOS PCT: 7 % (ref 3–12)
Monocytes Absolute: 0.7 10*3/uL (ref 0.1–1.0)
NEUTROS PCT: 65 % (ref 43–77)
Neutro Abs: 7.1 10*3/uL (ref 1.7–7.7)
PLATELETS: 270 10*3/uL (ref 150–400)
RBC: 4.35 MIL/uL (ref 3.87–5.11)
RDW: 12.9 % (ref 11.5–15.5)
WBC: 10.8 10*3/uL — ABNORMAL HIGH (ref 4.0–10.5)

## 2014-12-29 LAB — GLUCOSE, CAPILLARY
GLUCOSE-CAPILLARY: 135 mg/dL — AB (ref 70–99)
GLUCOSE-CAPILLARY: 181 mg/dL — AB (ref 70–99)
Glucose-Capillary: 104 mg/dL — ABNORMAL HIGH (ref 70–99)
Glucose-Capillary: 118 mg/dL — ABNORMAL HIGH (ref 70–99)
Glucose-Capillary: 153 mg/dL — ABNORMAL HIGH (ref 70–99)
Glucose-Capillary: 180 mg/dL — ABNORMAL HIGH (ref 70–99)

## 2014-12-29 LAB — MAGNESIUM: Magnesium: 1.5 mg/dL (ref 1.5–2.5)

## 2014-12-29 MED ORDER — EPINEPHRINE HCL 0.1 MG/ML IJ SOSY
0.1000 mg | PREFILLED_SYRINGE | Freq: Once | INTRAMUSCULAR | Status: DC
  Filled 2014-12-29: qty 10

## 2014-12-29 MED ORDER — SODIUM CHLORIDE 0.9 % IV SOLN
500.0000 mg | Freq: Three times a day (TID) | INTRAVENOUS | Status: DC
  Filled 2014-12-29 (×3): qty 500

## 2014-12-29 MED ORDER — CIPROFLOXACIN IN D5W 400 MG/200ML IV SOLN
400.0000 mg | Freq: Two times a day (BID) | INTRAVENOUS | Status: DC
  Administered 2014-12-29 – 2014-12-31 (×4): 400 mg via INTRAVENOUS
  Filled 2014-12-29 (×5): qty 200

## 2014-12-29 MED ORDER — MAGNESIUM OXIDE 400 (241.3 MG) MG PO TABS
400.0000 mg | ORAL_TABLET | Freq: Two times a day (BID) | ORAL | Status: DC
  Administered 2014-12-30 – 2014-12-31 (×3): 400 mg via ORAL
  Filled 2014-12-29 (×5): qty 1

## 2014-12-29 MED ORDER — INSULIN GLARGINE 100 UNIT/ML ~~LOC~~ SOLN
10.0000 [IU] | Freq: Every day | SUBCUTANEOUS | Status: DC
  Administered 2014-12-30 – 2014-12-31 (×2): 10 [IU] via SUBCUTANEOUS
  Filled 2014-12-29 (×2): qty 0.1

## 2014-12-29 MED ORDER — METHYLPREDNISOLONE SODIUM SUCC 40 MG IJ SOLR
40.0000 mg | Freq: Once | INTRAMUSCULAR | Status: AC
  Administered 2014-12-29: 40 mg via INTRAVENOUS
  Filled 2014-12-29: qty 1

## 2014-12-29 NOTE — Progress Notes (Signed)
ANTIBIOTIC CONSULT NOTE - INITIAL  Pharmacy Consult for Imipenem-cilastatin (Primaxin), continue vancomycin Indication: Cellulitis  Allergies  Allergen Reactions  . Penicillins Anaphylaxis and Hives  . Keflex [Cephalexin]     Rash     Patient Measurements: Height: 5\' 6"  (167.6 cm) Weight: 217 lb 12.8 oz (98.793 kg) IBW/kg (Calculated) : 59.3  Vital Signs: Temp: 98 F (36.7 C) (01/11 1300) Temp Source: Oral (01/11 0453) BP: 137/76 mmHg (01/11 1300) Pulse Rate: 92 (01/11 1300)  Labs:  Recent Labs  12/27/14 0507 12/28/14 0924 12/29/14 0450  WBC 12.6* 11.6* 10.8*  HGB 12.2 12.4 11.8*  PLT 321 263 270  CREATININE 1.25* 1.32* 1.09   Estimated Creatinine Clearance: 52.1 mL/min (by C-G formula based on Cr of 1.09).  Medical History: Past Medical History  Diagnosis Date  . Hypertension   . Hypothyroidism   . Panic attacks     mild  . SVT (supraventricular tachycardia)     in the past  . DM2 (diabetes mellitus, type 2)   . Hyperlipidemia    Assessment: On day # 4 of IV vancomycin and day #3 levaquin for cellulitis. Blood cultures x 2 no growth so far,  Wound culture pending. Dr. Allyson Sabal plans to continue IV vanco andchanged IV Levaquin to imipenem because of slow improvement.  Dr. Allyson Sabal aware of h/o PCN allergy->anaphylaxis & hives.  Dr. Allyson Sabal wants to proceed with primaxin IV, observe patient closely after 1st dose.  SCr improved, SCr = 1.09, estimated  CrCl ~52, other labs as above.   Goal of Therapy:  Vancomycin trough level 10-15 mcg/ml  Plan:  Start Primaxin 500 mg IV q8h.  Nurse to monitor for adverse reaction.  Continue vancomycin 1250 mg IV q24h Levaquin DC'd rend WBC, temp, renal function, cultures Drug levels as indicated   Thank you for allowing pharmacy to be part of this patients care team. Nicole Cella, RPh Clinical Pharmacist Pager: [phone] 12/29/2014,2:30 PM

## 2014-12-29 NOTE — Progress Notes (Addendum)
[facility] PROGRESS NOTE  Michaela Rhodes BTD:[id number] DOB: February 02, 1936 DOA: 12/26/2014 PCP: Michaela Post, MD  Assessment/Plan:  Cellulitis -Culture no growth so far -Continue iv vanco and  change IV Levaquin to iv ciprofloxacin  Due to more activity against Pseudomonas aeruginos -Blood culture x2 sets pending -Wound culture pending -Pack incision site with plain iodoform, change packing daily  MRI of left lower extremity shows Cellulitis of the left lower leg with a 1 x 3.8 x 4 cm fluid collection in the mid anterior lower leg subcutaneous fat concerning for an abscess.  Patient had I and D to drain abcess in ED and abcess is draining  Will consult ortho if worsens   Hives due to keflex Not imp[roving with benadryl Will give one dose IV solumedrol   ARF: Improving -Mild renal insufficiency -Continue normal saline 100 ml/hr  Diabetes type 2 uncontrolled -1/4 hemoglobin A1c= 10.1 -Patient is on 2 hyperglycemic medications and has failed to meet the ADA recommended hemoglobin A1c<7 -Control with moderate SSI Increase Lantus to 10 units daily Discontinue metformin XR 750 mg daily, discontinue Amaryl  HLD -Lipid panel shows that LDL> 70 mg/dL recommended by the ADA guidelines -Increase pravastatin to 60 mg daily  Hypothyroidism -1/4 TSH= 9.04 -Increase Synthroid to 150 g daily  Hypertension:  - Continue HCTZ 25 mg daily -continue lisinopril 20 mg daily    Code Status: Full  Family Communication: None Disposition Plan: Resolution abscess   Consultants: Dr. Sabra Heck   Procedures: 1/8 I & D of left lower extremity abscess   Cultures None  Antibiotics: Levofloxacin 1/9>> Vancomycin 1/9>>    HPI/Subjective: 79 yo WF PMHx DM type 2, HTN, SVT, hyperlipidemia, hypothyroidism c/o left leg redness and swelling x 1 week. Pt was tx with keflex which gave her itching. Pt had u/s at orthopedics which was negative for DVT. Pt presented to the ED  for evaluation and Dr. Sabra Heck performed and I and D. And found blood. No pus. Denies fever, chills. Pt will be admitted for cellulitis.  1/10 A/O x 4 sitting in chair comfortably, states does have pain with ambulation    Objective: Filed Vitals:   12/28/14 0540 12/28/14 1501 12/28/14 2032 12/29/14 0453  BP: 128/78 114/64 126/79 120/70  Pulse: 91 100 78 68  Temp: 98.1 F (36.7 C) 97.6 F (36.4 C) 98.1 F (36.7 C) 97.8 F (36.6 C)  TempSrc: Oral Oral Oral Oral  Resp: 18 18 18 18   Height:      Weight: 96.879 kg (213 lb 9.3 oz)   98.793 kg (217 lb 12.8 oz)  SpO2: 94% 100% 100% 97%    Intake/Output Summary (Last 24 hours) at 12/29/14 1308 Last data filed at 12/28/14 2300  Gross per 24 hour  Intake    680 ml  Output      0 ml  Net    680 ml   Filed Weights   12/27/14 0502 12/28/14 0540 12/29/14 0453  Weight: 96.6 kg (212 lb 15.4 oz) 96.879 kg (213 lb 9.3 oz) 98.793 kg (217 lb 12.8 oz)     Exam: General: A/O 4, NAD, No acute respiratory distress Lungs: Clear to auscultation bilaterally without wheezes or crackles Cardiovascular: Regular rate and rhythm without murmur gallop or rub normal S1 and S2 Abdomen: Nontender, nondistended, soft, bowel sounds positive, no rebound, no ascites, no appreciable mass Extremities: No significant cyanosis, clubbing, or edema right lower extremities. Left lower extremity has a open incision where I and D was performed, copious  serosanguineous drainage; abscess tract is approximately 5 cm at its longest from the incision opening   Data Reviewed: Basic Metabolic Panel:  Recent Labs Lab 12/26/14 2253 12/27/14 0507 12/28/14 0924 12/29/14 0450  NA 141 134* 138 137  K 4.3 3.7 4.0 3.9  CL 103 104 106 109  CO2  --  23 23 24   GLUCOSE 164* 247* 226* 102*  BUN 48* 41* 31* 27*  CREATININE 1.40* 1.25* 1.32* 1.09  CALCIUM  --  8.5 8.8 8.1*  MG  --   --  1.6 1.5   Liver Function Tests:  Recent Labs Lab 12/27/14 0507 12/28/14 0924  12/29/14 0450  AST 17 18 16   ALT 16 13 12   ALKPHOS 111 101 92  BILITOT 0.5 0.5 0.5  PROT 5.7* 5.2* 4.8*  ALBUMIN 3.2* 2.9* 2.6*   No results for input(s): LIPASE, AMYLASE in the last 168 hours. No results for input(s): AMMONIA in the last 168 hours. CBC:  Recent Labs Lab 12/26/14 2236 12/26/14 2253 12/27/14 0507 12/28/14 0924 12/29/14 0450  WBC 14.3*  --  12.6* 11.6* 10.8*  NEUTROABS 9.9*  --  8.3* 8.0* 7.1  HGB 13.5 14.6 12.2 12.4 11.8*  HCT 40.6 43.0 37.4 38.1 36.7  MCV 84.9  --  83.7 84.7 84.4  PLT 340  --  321 263 270   Cardiac Enzymes: No results for input(s): CKTOTAL, CKMB, CKMBINDEX, TROPONINI in the last 168 hours. BNP (last 3 results) No results for input(s): PROBNP in the last 8760 hours. CBG:  Recent Labs Lab 12/28/14 1711 12/28/14 2028 12/29/14 0021 12/29/14 0405 12/29/14 1124  GLUCAP 87 218* 135* 104* 153*    Recent Results (from the past 240 hour(s))  Culture, blood (routine x 2)     Status: None (Preliminary result)   Collection Time: 12/28/14 11:50 AM  Result Value Ref Range Status   Specimen Description RIGHT ANTECUBITAL  Final   Special Requests BOTTLES DRAWN AEROBIC ONLY 5CC  Final   Culture   Final           BLOOD CULTURE RECEIVED NO GROWTH TO DATE CULTURE WILL BE HELD FOR 5 DAYS BEFORE ISSUING A FINAL NEGATIVE REPORT Performed at [facility]    Report Status PENDING  Incomplete  Culture, blood (routine x 2)     Status: None (Preliminary result)   Collection Time: 12/28/14 12:00 PM  Result Value Ref Range Status   Specimen Description BLOOD RIGHT HAND  Final   Special Requests BOTTLES DRAWN AEROBIC ONLY 5CC  Final   Culture   Final           BLOOD CULTURE RECEIVED NO GROWTH TO DATE CULTURE WILL BE HELD FOR 5 DAYS BEFORE ISSUING A FINAL NEGATIVE REPORT Performed at [facility]    Report Status PENDING  Incomplete     Studies: Michaela Rhodes  12/28/2014   CLINICAL DATA:  79 year old female who is  a diabetic who presents with left lower extremity swelling after bumping her anterior leg 2 weeks ago when she fell up the steps. She started with a small amount of redness which has progressed to involve her entire left lower extremity from the knee through the foot and is now associated with blistering to the right and left lateral ankles.  EXAM: MRI OF LOWER LEFT EXTREMITY WITHOUT Rhodes  TECHNIQUE: Multiplanar, multisequence Michaela imaging of the left lower leg was performed. No intravenous Rhodes was administered.  COMPARISON:  None.  FINDINGS:  No marrow signal abnormality. No fracture or dislocation. No periosteal reaction. There is no focal muscle signal abnormality. The fascial planes are maintained.  Generalized edema within the subcutaneous fat of the left lower leg from the knee to the level of the ankle with overlying skin thickening. There has been an incision in and drainage of the left anterior lower leg. There is a 1 x 3.8 x 4 cm fluid collection in the subcutaneous fat of the mid anterior left lower leg. There is a focal area of fluid signal overlying the lateral malleolus likely representing a skin blister. There is a focal area of fluid signal overlying the lateral malleolus likely representing a skin blister.  There is no other fluid collection or hematoma.  IMPRESSION: 1. Cellulitis of the left lower leg with a 1 x 3.8 x 4 cm fluid collection in the mid anterior lower leg subcutaneous fat concerning for an abscess. No evidence up osteomyelitis or myositis. 2. Skin blisters over the lateral malleoli bilaterally.   Electronically Signed   By: Kathreen Devoid   On: 12/28/2014 14:13    Scheduled Meds: . calcium-vitamin D  1 tablet Oral Daily  . hydrochlorothiazide  25 mg Oral Daily  . insulin aspart  0-15 Units Subcutaneous 6 times per day  . insulin glargine  8 Units Subcutaneous Daily  . levofloxacin (LEVAQUIN) IV  750 mg Intravenous Q48H  . levothyroxine  150 mcg Oral QAC breakfast  .  lisinopril  20 mg Oral Daily  . metFORMIN  750 mg Oral Q breakfast  . metoprolol  50 mg Oral BID  . pravastatin  60 mg Oral Daily  . sodium chloride  3 mL Intravenous Q12H  . vancomycin  1,250 mg Intravenous Q24H   Continuous Infusions: . sodium chloride 125 mL/hr at 12/28/14 2152    Active Problems:   Essential hypertension   Hyperglycemia   Hyperlipidemia   Cellulitis of left lower extremity   Cellulitis   Diabetes type 2, uncontrolled   Other specified hypothyroidism   Acute renal failure syndrome    Time spent: 40 minutes  [facility] Pager [phone]. If 7PM-7AM, please contact night-coverage at [email or web address], password [facility] 12/29/2014, 1:08 PM  LOS: 3 days

## 2014-12-29 NOTE — Progress Notes (Signed)
Pt continues to have hives on her abdomen, chest and back. Benadryl 25 mg is providing minimal relief.

## 2014-12-30 ENCOUNTER — Encounter (HOSPITAL_COMMUNITY)
Admission: EM | Disposition: A | Discharge status: Home / Self Care | Payer: Self-pay | Source: Home / Self Care | Attending: Internal Medicine

## 2014-12-30 ENCOUNTER — Encounter (HOSPITAL_COMMUNITY): Payer: Self-pay | Admitting: Anesthesiology

## 2014-12-30 ENCOUNTER — Inpatient Hospital Stay (HOSPITAL_COMMUNITY): Payer: Commercial Managed Care - HMO

## 2014-12-30 DIAGNOSIS — L03116 Cellulitis of left lower limb: Secondary | ICD-10-CM

## 2014-12-30 DIAGNOSIS — R609 Edema, unspecified: Secondary | ICD-10-CM

## 2014-12-30 LAB — CBC WITH DIFFERENTIAL/PLATELET
Basophils Absolute: 0 10*3/uL (ref 0.0–0.1)
Basophils Relative: 0 % (ref 0–1)
EOS ABS: 0 10*3/uL (ref 0.0–0.7)
EOS PCT: 0 % (ref 0–5)
HCT: 38.7 % (ref 36.0–46.0)
HEMOGLOBIN: 12.7 g/dL (ref 12.0–15.0)
LYMPHS ABS: 0.9 10*3/uL (ref 0.7–4.0)
Lymphocytes Relative: 8 % — ABNORMAL LOW (ref 12–46)
MCH: 27.9 pg (ref 26.0–34.0)
MCHC: 32.8 g/dL (ref 30.0–36.0)
MCV: 84.9 fL (ref 78.0–100.0)
MONOS PCT: 1 % — AB (ref 3–12)
Monocytes Absolute: 0.1 10*3/uL (ref 0.1–1.0)
Neutro Abs: 10.6 10*3/uL — ABNORMAL HIGH (ref 1.7–7.7)
Neutrophils Relative %: 91 % — ABNORMAL HIGH (ref 43–77)
Platelets: 296 10*3/uL (ref 150–400)
RBC: 4.56 MIL/uL (ref 3.87–5.11)
RDW: 12.9 % (ref 11.5–15.5)
WBC: 11.7 10*3/uL — ABNORMAL HIGH (ref 4.0–10.5)

## 2014-12-30 LAB — GLUCOSE, CAPILLARY
GLUCOSE-CAPILLARY: 161 mg/dL — AB (ref 70–99)
GLUCOSE-CAPILLARY: 138 mg/dL — AB (ref 70–99)
GLUCOSE-CAPILLARY: 160 mg/dL — AB (ref 70–99)
Glucose-Capillary: 98 mg/dL (ref 70–99)
Glucose-Capillary: 245 mg/dL — ABNORMAL HIGH (ref 70–99)

## 2014-12-30 LAB — COMPREHENSIVE METABOLIC PANEL
ALBUMIN: 3 g/dL — AB (ref 3.5–5.2)
ALT: 14 U/L (ref 0–35)
AST: 18 U/L (ref 0–37)
Alkaline Phosphatase: 96 U/L (ref 39–117)
Anion gap: 5 (ref 5–15)
BILIRUBIN TOTAL: 0.5 mg/dL (ref 0.3–1.2)
BUN: 30 mg/dL — AB (ref 6–23)
CALCIUM: 8.2 mg/dL — AB (ref 8.4–10.5)
CO2: 24 mmol/L (ref 19–32)
Chloride: 104 mEq/L (ref 96–112)
Creatinine, Ser: 1.1 mg/dL (ref 0.50–1.10)
GFR calc Af Amer: 55 mL/min — ABNORMAL LOW (ref >90)
GFR calc non Af Amer: 48 mL/min — ABNORMAL LOW (ref >90)
Glucose, Bld: 166 mg/dL — ABNORMAL HIGH (ref 70–99)
Potassium: 3.7 mmol/L (ref 3.5–5.1)
Sodium: 133 mmol/L — ABNORMAL LOW (ref 135–145)
TOTAL PROTEIN: 5.5 g/dL — AB (ref 6.0–8.3)

## 2014-12-30 LAB — MAGNESIUM: Magnesium: 1.5 mg/dL (ref 1.5–2.5)

## 2014-12-30 SURGERY — IRRIGATION AND DEBRIDEMENT EXTREMITY
Anesthesia: General | Laterality: Left

## 2014-12-30 MED ORDER — DOXYCYCLINE HYCLATE 50 MG PO CAPS: 50.0000 mg | ORAL_CAPSULE | Freq: Two times a day (BID) | ORAL | Status: DC

## 2014-12-30 MED ORDER — TRAMADOL-ACETAMINOPHEN 37.5-325 MG PO TABS: 1.0000 | ORAL_TABLET | Freq: Four times a day (QID) | ORAL | Status: DC | PRN

## 2014-12-30 MED ORDER — PREDNISONE 20 MG PO TABS
20.0000 mg | ORAL_TABLET | Freq: Every day | ORAL | Status: DC
  Administered 2014-12-30 – 2014-12-31 (×2): 20 mg via ORAL
  Filled 2014-12-30 (×3): qty 1

## 2014-12-30 MED ORDER — LEVOTHYROXINE SODIUM 125 MCG PO TABS
125.0000 ug | ORAL_TABLET | Freq: Every day | ORAL | Status: DC
  Administered 2014-12-31: 125 ug via ORAL
  Filled 2014-12-30 (×2): qty 1

## 2014-12-30 MED ORDER — PREDNISONE 20 MG PO TABS: 20.0000 mg | ORAL_TABLET | Freq: Every day | ORAL | Status: AC

## 2014-12-30 MED ORDER — CIPROFLOXACIN HCL 500 MG PO TABS: 500.0000 mg | ORAL_TABLET | Freq: Two times a day (BID) | ORAL | Status: DC

## 2014-12-30 MED ORDER — PREDNISONE 20 MG PO TABS: 20.0000 mg | ORAL_TABLET | Freq: Every day | ORAL | Status: DC

## 2014-12-30 NOTE — Evaluation (Signed)
Physical Therapy Evaluation Patient Details Name: Michaela Rhodes MRN: [id number] DOB: August 26, 1936 Today's Date: 12/30/2014   History of Present Illness  Pt is a 79 yo female with dm2, htn, hyperlipidemia, hypothyroidism apparently c/o left leg redness and swelling x 1 week. Pt was tx with keflex which gave her itching. Pt had u/s at orthopedics which was negative for DVT. Pt presented to the ED for evaluation and Dr. Sabra Heck performed and I and D. Pt admitted for cellulitis.   Clinical Impression  Pt admitted with above diagnosis. Pt currently with functional limitations due to the deficits listed below (see PT Problem List). At the time of PT eval pt was able to perform transfers and ambulation without assistance. Pt will benefit from skilled PT to increase their independence and safety with mobility to allow discharge to the venue listed below.  Pt to have another I&D later today. Will check back tomorrow for d/c needs.      Follow Up Recommendations No PT follow up    Equipment Recommendations  Rolling walker with 5" wheels    Recommendations for Other Services       Precautions / Restrictions Precautions Precautions: Fall Restrictions Weight Bearing Restrictions: No      Mobility  Bed Mobility               General bed mobility comments: Pt sitting up in recliner upon PT arrival.   Transfers Overall transfer level: Needs assistance Equipment used: None Transfers: Sit to/from Stand Sit to Stand: Modified independent (Device/Increase time)         General transfer comment: No unsteadiness noted, increased time for safety.   Ambulation/Gait Ambulation/Gait assistance: Modified independent (Device/Increase time) Ambulation Distance (Feet): 500 Feet Assistive device: None Gait Pattern/deviations: Step-through pattern;Decreased stride length;Trendelenburg Gait velocity: Decreased Gait velocity interpretation: Below normal speed for age/gender General Gait  Details: Pt was able to ambulate with no unsteadiness noted. No AD required and pt did not report fatigue at end of gait training.   Stairs            Wheelchair Mobility    Modified Rankin (Stroke Patients Only)       Balance Overall balance assessment: Needs assistance Sitting-balance support: Feet supported;No upper extremity supported Sitting balance-Leahy Scale: Normal     Standing balance support: No upper extremity supported;During functional activity Standing balance-Leahy Scale: Fair                               Pertinent Vitals/Pain Pain Assessment: No/denies pain    Home Living Family/patient expects to be discharged to:: Private residence Living Arrangements: Alone Available Help at Discharge: Family;Available PRN/intermittently Type of Home: House Home Access: Stairs to enter     Home Layout: Able to live on main level with bedroom/bathroom Home Equipment: None Additional Comments: Son and daughter-in-law live very close and check in on her often     Prior Function Level of Independence: Independent               Hand Dominance   Dominant Hand: Right    Extremity/Trunk Assessment   Upper Extremity Assessment: RUE deficits/detail RUE Deficits / Details: Pt reports chronic shoulder pain and AROM/strength are limited at eval.          Lower Extremity Assessment: Overall WFL for tasks assessed (4-/5 hip flexor weakness bilaterally)      Cervical / Trunk Assessment: Normal  Communication   Communication:  No difficulties  Cognition Arousal/Alertness: Awake/alert Behavior During Therapy: WFL for tasks assessed/performed Overall Cognitive Status: Within Functional Limits for tasks assessed                      General Comments      Exercises        Assessment/Plan    PT Assessment Patient needs continued PT services  PT Diagnosis Difficulty walking;Abnormality of gait   PT Problem List Decreased  strength;Decreased range of motion;Decreased activity tolerance;Decreased balance;Decreased mobility;Decreased knowledge of use of DME;Decreased safety awareness;Decreased knowledge of precautions;Cardiopulmonary status limiting activity  PT Treatment Interventions DME instruction;Gait training;Stair training;Functional mobility training;Therapeutic activities;Therapeutic exercise;Neuromuscular re-education;Patient/family education   PT Goals (Current goals can be found in the Care Plan section) Acute Rehab PT Goals Patient Stated Goal: Return to her home PT Goal Formulation: With patient Time For Goal Achievement: 01/13/15 Potential to Achieve Goals: Good    Frequency Min 3X/week   Barriers to discharge        Co-evaluation               End of Session Equipment Utilized During Treatment: Gait belt Activity Tolerance: Patient tolerated treatment well Patient left: in chair;with call bell/phone within reach;with nursing/sitter in room Nurse Communication: Mobility status         Time: 1117-1130 PT Time Calculation (min) (ACUTE ONLY): 13 min   Charges:   PT Evaluation $Initial PT Evaluation Tier I: 1 Procedure PT Treatments $Gait Training: 8-22 mins   PT G Codes:        Rolinda Roan Jan 01, 2015, 2:02 PM   Rolinda Roan, PT, DPT Acute Rehabilitation Services Pager: [phone]

## 2014-12-30 NOTE — Progress Notes (Signed)
PT Cancellation Note  Patient Details Name: BRIZEIDA MCMURRY MRN: [id number] DOB: Jun 06, 1936   Cancelled Treatment:    Reason Eval/Treat Not Completed: Patient at procedure or test/unavailable. Pt off unit at MRI during attempted PT eval. Will check back as time allows.   Rolinda Roan 12/30/2014, 9:57 AM   Rolinda Roan, PT, DPT Acute Rehabilitation Services Pager: [phone]

## 2014-12-30 NOTE — Care Management (Signed)
Utilization Review completed   Ashrita Chrismer,RN, BSN,CCM 

## 2014-12-30 NOTE — Consult Note (Signed)
[facility] (OTS)  Reason for Consult: Abscess L lower leg, subacute wound  Referring Physician: Derrek Gu, MD   HPI: Michaela Rhodes is an 79 y.o. white female with history of diabetes, hypothyroidism and hypertension who sustained an injury approximately 3 weeks ago. Patient states that she tripped going up a set of steps. This resulted in a wound to her left anterior shin. It appeared to be completely superficial. Patient to use some over-the-counter ointments for treatment. This did not resolve. She subsequently went to her primary care physician who felt that it was a contact dermatitis from an agent that she use that was over-the-counter. She was given Depo-Medrol at the first visit. Several days later she presented back to the PCP with progressive worsening of her symptoms. On this presentation it appeared to be more cellulitic in nature. She was then referred for Doppler which was negative for DVT. She was also started on Keflex which caused her some itching. She was ultimately referred to orthopedics who recommended that she be admitted to medicine for IV antibiotics. Patient was seen in the emergency department on 12/26/2014. I&D was performed in the emergency department. No cultures were sent at that time. Blood cultures and wound cultures were sent on 12/28/2014. Patient has been on vancomycin and fluoroquinolone since admission. She was started on Levaquin but is since been converted to Cipro IV. MRI of her tibia was performed to evaluate the possibility of osteomyelitis given her comorbidities. No osseous was evident however persistent abscess was noted in the subcutaneous tissue. Orthopedics consultation to evaluate and treat her persistent abscess.  Patient seen today on 5 N. 11. She does not complain of any significant pain she denies any baseline for her. Does not appear to comprehend the magnitude of her diabetes. Denies any additional wounds. No other complaints or  concerns.   Hemoglobin A1c: 9.9 Sedimentation rate: 11  Past Medical History  Diagnosis Date  . Hypertension   . Hypothyroidism   . Panic attacks     mild  . SVT (supraventricular tachycardia)     in the past  . DM2 (diabetes mellitus, type 2)   . Hyperlipidemia     Past Surgical History  Procedure Laterality Date  . Appendectomy    . Cholecystectomy    . Mastectomy      Left  . Abdominal hysterectomy      BSO as well  . Reconstruction breast w/ latissimus dorsi flap    . Orif shoulder fracture  05/27/2012    Procedure: OPEN REDUCTION INTERNAL FIXATION (ORIF) SHOULDER FRACTURE;  Surgeon: Johnny Bridge, MD;  Location: [place];  Service: Orthopedics;  Laterality: Right;  . Total shoulder arthroplasty  05/27/2012    Procedure: TOTAL SHOULDER ARTHROPLASTY;  Surgeon: Johnny Bridge, MD;  Location: [place];  Service: Orthopedics;  Laterality: Right;    Family History  Problem Relation Age of Onset  . Coronary artery disease      family hx of  . Diabetes      family hx of  . Hypertension      family hx of  . Heart disease Mother   . Hypertension Mother   . Stroke Father   . Hypertension Father   . Hypertension Sister     Social History:  reports that she has never smoked. She does not have any smokeless tobacco history on file. She reports that she does not drink alcohol or use illicit drugs.  Allergies:  Allergies  Allergen  Reactions  . Penicillins Anaphylaxis and Hives  . Keflex [Cephalexin]     Rash     Medications:  I have reviewed the patient's current medications. Prior to Admission:  Prescriptions prior to admission  Medication Sig Dispense Refill Last Dose  . calcium-vitamin D (OSCAL WITH D) 500-200 MG-UNIT per tablet Take 1 tablet by mouth daily. 100 tablet 2 12/25/2014 at Unknown time  . glimepiride (AMARYL) 2 MG tablet Take 1 tablet (2 mg total) by mouth daily with breakfast. 90 tablet 2 12/26/2014 at Unknown time  . levothyroxine (SYNTHROID, LEVOTHROID) 125  MCG tablet Take 1 tablet (125 mcg total) by mouth daily before breakfast. 90 tablet 2 12/26/2014 at Unknown time  . lisinopril-hydrochlorothiazide (PRINZIDE,ZESTORETIC) 20-25 MG per tablet Take 1 tablet by mouth daily. 90 tablet 2 12/26/2014 at Unknown time  . metFORMIN (GLUCOPHAGE XR) 750 MG 24 hr tablet Take 1 tablet (750 mg total) by mouth daily with breakfast. 90 tablet 2 12/26/2014 at Unknown time  . metoprolol (LOPRESSOR) 50 MG tablet Take 1 tablet (50 mg total) by mouth 2 (two) times daily. 180 tablet 2 12/26/2014 at Unknown time  . pravastatin (PRAVACHOL) 40 MG tablet Take 1 tablet (40 mg total) by mouth daily. 90 tablet 2 12/26/2014 at Unknown time  . cephALEXin (KEFLEX) 500 MG capsule Take 1 capsule (500 mg total) by mouth 3 (three) times daily. (Patient not taking: Reported on 12/26/2014) 30 capsule 0 Not Taking at Unknown time    Results for orders placed or performed during the hospital encounter of 12/26/14 (from the past 48 hour(s))  Culture, blood (routine x 2)     Status: None (Preliminary result)   Collection Time: 12/28/14 11:50 AM  Result Value Ref Range   Specimen Description RIGHT ANTECUBITAL    Special Requests BOTTLES DRAWN AEROBIC ONLY 5CC    Culture             BLOOD CULTURE RECEIVED NO GROWTH TO DATE CULTURE WILL BE HELD FOR 5 DAYS BEFORE ISSUING A FINAL NEGATIVE REPORT Performed at [facility]    Report Status PENDING   Culture, blood (routine x 2)     Status: None (Preliminary result)   Collection Time: 12/28/14 12:00 PM  Result Value Ref Range   Specimen Description BLOOD RIGHT HAND    Special Requests BOTTLES DRAWN AEROBIC ONLY 5CC    Culture             BLOOD CULTURE RECEIVED NO GROWTH TO DATE CULTURE WILL BE HELD FOR 5 DAYS BEFORE ISSUING A FINAL NEGATIVE REPORT Performed at [facility]    Report Status PENDING   Glucose, capillary     Status: None   Collection Time: 12/28/14  5:11 PM  Result Value Ref Range   Glucose-Capillary 87 70 - 99 mg/dL    Wound culture     Status: None (Preliminary result)   Collection Time: 12/28/14  5:48 PM  Result Value Ref Range   Specimen Description WOUND LEFT LEG    Special Requests NONE    Gram Stain PENDING    Culture      NO GROWTH 1 DAY Performed at [facility]    Report Status PENDING   Glucose, capillary     Status: Abnormal   Collection Time: 12/28/14  8:28 PM  Result Value Ref Range   Glucose-Capillary 218 (H) 70 - 99 mg/dL  Glucose, capillary     Status: Abnormal   Collection Time: 12/29/14 12:21 AM  Result Value Ref Range   Glucose-Capillary 135 (H) 70 - 99 mg/dL  Glucose, capillary     Status: Abnormal   Collection Time: 12/29/14  4:05 AM  Result Value Ref Range   Glucose-Capillary 104 (H) 70 - 99 mg/dL  Comprehensive metabolic panel     Status: Abnormal   Collection Time: 12/29/14  4:50 AM  Result Value Ref Range   Sodium 137 135 - 145 mmol/L    Comment: Please note change in reference range.   Potassium 3.9 3.5 - 5.1 mmol/L    Comment: Please note change in reference range.   Chloride 109 96 - 112 mEq/L   CO2 24 19 - 32 mmol/L   Glucose, Bld 102 (H) 70 - 99 mg/dL   BUN 27 (H) 6 - 23 mg/dL   Creatinine, Ser 1.09 0.50 - 1.10 mg/dL   Calcium 8.1 (L) 8.4 - 10.5 mg/dL   Total Protein 4.8 (L) 6.0 - 8.3 g/dL   Albumin 2.6 (L) 3.5 - 5.2 g/dL   AST 16 0 - 37 U/L   ALT 12 0 - 35 U/L   Alkaline Phosphatase 92 39 - 117 U/L   Total Bilirubin 0.5 0.3 - 1.2 mg/dL   GFR calc non Af Amer 48 (L) >90 mL/min   GFR calc Af Amer 56 (L) >90 mL/min    Comment: (NOTE) The eGFR has been calculated using the CKD EPI equation. This calculation has not been validated in all clinical situations. eGFR's persistently <90 mL/min signify possible Chronic Kidney Disease.    Anion gap 4 (L) 5 - 15  CBC with Differential     Status: Abnormal   Collection Time: 12/29/14  4:50 AM  Result Value Ref Range   WBC 10.8 (H) 4.0 - 10.5 K/uL   RBC 4.35 3.87 - 5.11 MIL/uL   Hemoglobin 11.8 (L)  12.0 - 15.0 g/dL   HCT 36.7 36.0 - 46.0 %   MCV 84.4 78.0 - 100.0 fL   MCH 27.1 26.0 - 34.0 pg   MCHC 32.2 30.0 - 36.0 g/dL   RDW 12.9 11.5 - 15.5 %   Platelets 270 150 - 400 K/uL   Neutrophils Relative % 65 43 - 77 %   Neutro Abs 7.1 1.7 - 7.7 K/uL   Lymphocytes Relative 17 12 - 46 %   Lymphs Abs 1.8 0.7 - 4.0 K/uL   Monocytes Relative 7 3 - 12 %   Monocytes Absolute 0.7 0.1 - 1.0 K/uL   Eosinophils Relative 11 (H) 0 - 5 %   Eosinophils Absolute 1.2 (H) 0.0 - 0.7 K/uL   Basophils Relative 0 0 - 1 %   Basophils Absolute 0.0 0.0 - 0.1 K/uL  Magnesium     Status: None   Collection Time: 12/29/14  4:50 AM  Result Value Ref Range   Magnesium 1.5 1.5 - 2.5 mg/dL  Glucose, capillary     Status: Abnormal   Collection Time: 12/29/14 11:24 AM  Result Value Ref Range   Glucose-Capillary 153 (H) 70 - 99 mg/dL   Comment 1 Notify RN   Glucose, capillary     Status: Abnormal   Collection Time: 12/29/14  4:21 PM  Result Value Ref Range   Glucose-Capillary 181 (H) 70 - 99 mg/dL   Comment 1 Notify RN   Glucose, capillary     Status: Abnormal   Collection Time: 12/29/14  8:07 PM  Result Value Ref Range   Glucose-Capillary 118 (H) 70 - 99 mg/dL  Glucose, capillary     Status: Abnormal   Collection Time: 12/29/14 11:48 PM  Result Value Ref Range   Glucose-Capillary 180 (H) 70 - 99 mg/dL  Glucose, capillary     Status: Abnormal   Collection Time: 12/30/14  4:11 AM  Result Value Ref Range   Glucose-Capillary 161 (H) 70 - 99 mg/dL  Comprehensive metabolic panel     Status: Abnormal   Collection Time: 12/30/14  5:55 AM  Result Value Ref Range   Sodium 133 (L) 135 - 145 mmol/L    Comment: Please note change in reference range.   Potassium 3.7 3.5 - 5.1 mmol/L    Comment: Please note change in reference range.   Chloride 104 96 - 112 mEq/L   CO2 24 19 - 32 mmol/L   Glucose, Bld 166 (H) 70 - 99 mg/dL   BUN 30 (H) 6 - 23 mg/dL   Creatinine, Ser 1.10 0.50 - 1.10 mg/dL   Calcium 8.2 (L) 8.4  - 10.5 mg/dL   Total Protein 5.5 (L) 6.0 - 8.3 g/dL   Albumin 3.0 (L) 3.5 - 5.2 g/dL   AST 18 0 - 37 U/L   ALT 14 0 - 35 U/L   Alkaline Phosphatase 96 39 - 117 U/L   Total Bilirubin 0.5 0.3 - 1.2 mg/dL   GFR calc non Af Amer 48 (L) >90 mL/min   GFR calc Af Amer 55 (L) >90 mL/min    Comment: (NOTE) The eGFR has been calculated using the CKD EPI equation. This calculation has not been validated in all clinical situations. eGFR's persistently <90 mL/min signify possible Chronic Kidney Disease.    Anion gap 5 5 - 15  CBC with Differential     Status: Abnormal   Collection Time: 12/30/14  5:55 AM  Result Value Ref Range   WBC 11.7 (H) 4.0 - 10.5 K/uL   RBC 4.56 3.87 - 5.11 MIL/uL   Hemoglobin 12.7 12.0 - 15.0 g/dL   HCT 38.7 36.0 - 46.0 %   MCV 84.9 78.0 - 100.0 fL   MCH 27.9 26.0 - 34.0 pg   MCHC 32.8 30.0 - 36.0 g/dL   RDW 12.9 11.5 - 15.5 %   Platelets 296 150 - 400 K/uL   Neutrophils Relative % 91 (H) 43 - 77 %   Neutro Abs 10.6 (H) 1.7 - 7.7 K/uL   Lymphocytes Relative 8 (L) 12 - 46 %   Lymphs Abs 0.9 0.7 - 4.0 K/uL   Monocytes Relative 1 (L) 3 - 12 %   Monocytes Absolute 0.1 0.1 - 1.0 K/uL   Eosinophils Relative 0 0 - 5 %   Eosinophils Absolute 0.0 0.0 - 0.7 K/uL   Basophils Relative 0 0 - 1 %   Basophils Absolute 0.0 0.0 - 0.1 K/uL  Magnesium     Status: None   Collection Time: 12/30/14  5:55 AM  Result Value Ref Range   Magnesium 1.5 1.5 - 2.5 mg/dL  Glucose, capillary     Status: Abnormal   Collection Time: 12/30/14  7:53 AM  Result Value Ref Range   Glucose-Capillary 138 (H) 70 - 99 mg/dL   Comment 1 Notify RN    Comment 2 Documented in Chart     Mr Tibia Fibula Left Wo Contrast  12/28/2014   CLINICAL DATA:  79 year old female who is a diabetic who presents with left lower extremity swelling after bumping her anterior leg 2 weeks ago when she fell up the steps.  She started with a small amount of redness which has progressed to involve her entire left lower  extremity from the knee through the foot and is now associated with blistering to the right and left lateral ankles.  EXAM: MRI OF LOWER LEFT EXTREMITY WITHOUT CONTRAST  TECHNIQUE: Multiplanar, multisequence MR imaging of the left lower leg was performed. No intravenous contrast was administered.  COMPARISON:  None.  FINDINGS: No marrow signal abnormality. No fracture or dislocation. No periosteal reaction. There is no focal muscle signal abnormality. The fascial planes are maintained.  Generalized edema within the subcutaneous fat of the left lower leg from the knee to the level of the ankle with overlying skin thickening. There has been an incision in and drainage of the left anterior lower leg. There is a 1 x 3.8 x 4 cm fluid collection in the subcutaneous fat of the mid anterior left lower leg. There is a focal area of fluid signal overlying the lateral malleolus likely representing a skin blister. There is a focal area of fluid signal overlying the lateral malleolus likely representing a skin blister.  There is no other fluid collection or hematoma.  IMPRESSION: 1. Cellulitis of the left lower leg with a 1 x 3.8 x 4 cm fluid collection in the mid anterior lower leg subcutaneous fat concerning for an abscess. No evidence up osteomyelitis or myositis. 2. Skin blisters over the lateral malleoli bilaterally.   Electronically Signed   By: Kathreen Devoid   On: 12/28/2014 14:13    Review of Systems  Constitutional: Negative for fever and chills.  Respiratory: Negative for shortness of breath and wheezing.   Cardiovascular: Negative for chest pain and palpitations.  Gastrointestinal: Negative for nausea, vomiting and abdominal pain.  Musculoskeletal: Negative for myalgias and joint pain.  Neurological: Negative for tingling and sensory change.   Blood pressure 122/65, pulse 70, temperature 97.5 F (36.4 C), temperature source Oral, resp. rate 16, height _0  (1.676 m), weight 102.059 kg (225 lb), SpO2 100  %. Physical Exam  Constitutional: Vital signs are normal. She appears well-developed and well-nourished. She is cooperative. No distress.  Cardiovascular: Normal rate, regular rhythm, S1 normal and S2 normal.   Respiratory: Effort normal and breath sounds normal. She has no decreased breath sounds. She has no wheezes. She has no rhonchi.  GI: Soft. Normal appearance and bowel sounds are normal. She exhibits no distension. There is no tenderness.  Musculoskeletal:  Left lower extremity Inspection:   Dressing noted to the left lower leg, Kerlix gauze   This was removed, approximately 2-1/2 cm incision anteromedially to the tibial crest noted. There is packing.   The packing was removed    Questionable purulence over the muscle, no bone is visible    Wound is approximately 5-8 mm in depth but it does extend fairly proximally beyond the incision itself (See pictures)   Patient also has a black eschar proximal to the incised area approximate the size of a nickel Bony eval:   Nontender to the knee, tibial shaft, ankle foot Soft tissue:   As noted above   Of the lower leg in general has erythema that extends just distal to the knee. Patient's dates that this has decreased in intensity for the last several days area and at no point in time as it extended beyond her knee.   Cavity appreciated proximally beyond the end of the proximal limb of the incision. No fluid encountered   She does have blisters along the medial  and lateral aspects of her ankle as well as posteriorly these appear to be filled with serous fluid ROM:   Good ankle and knee range of motion noted   Blocks to motion Sensation:   DPN, SPN, TN sensory function intact grossly to light touch Motor:   EHL, FHL, anterior tibialis, posterior tibialis, peroneals and gastrocsoleus complex motor functions grossly intact   Knee flexion and extension grossly intact Vascular:   Palpable dorsalis pedis pulses noted   Extremity is warm    Brisk capillary refill   Compartments soft and nontender     Neurological: She is alert.         Assessment/Plan:  79 year old female numerous comorbidities with cellulitis to left leg and and abscess  1. Left leg cellulitis and abscess  OR later today for I&D  May need to excise eschar.  Concerned about patient's healing potential and cleanliness of her wound then  Patient may need a wound VAC for short period of time  We will check intraoperative cultures as well but not likely to yield anything and she's been on multiple antibodies are last several days   Given presence of diabetes, will also check ABIs   check CRP  2. Left ankle blisters  Concerned that these may be reflective of fracture blisters  Check ankle x-rays  3. Medical issues  Per primary service  Patient will need optimize her blood sugar control long-term  4. FEN  NPO  5. Disposition  OR later this afternoon  Jari Pigg, PA-C [facility] [phone] (P) 12/30/2014, 11:38 AM

## 2014-12-30 NOTE — Progress Notes (Addendum)
[facility] PROGRESS NOTE  Michaela Rhodes WEX:[id number] DOB: [birth year] DOA: 12/26/2014 PCP: [name], MD    Assessment/Plan:  Cellulitis -Culture no growth so far -Continue iv vanco and  change IV Levaquin to iv ciprofloxacin  Due to more activity against Pseudomonas aeruginos -Blood culture x2 sets pending -Wound culture pending -Pack incision site with plain iodoform, change packing daily  MRI of left lower extremity shows Cellulitis of the left lower leg with a 1 x 3.8 x 4 cm fluid collection in the mid anterior lower leg subcutaneous fat concerning for an abscess.  Patient had I and D to drain abcess in ED and abcess is draining  Consulted orthopedics, Dr. Marcelino Scot. Per their recommendations the patient may need an I and D to be done this afternoon Therefore hold off on discharging the patient today   Hives due to keflex Not imp[roving with benadryl Will give one dose IV solumedrol   ARF: Improving -Mild renal insufficiency -Continue normal saline 100 ml/hr  Diabetes type 2 uncontrolled -1/4 hemoglobin A1c= 10.1 -Patient is on 2 hyperglycemic medications and has failed to meet the ADA recommended hemoglobin A1c<7 -Control with moderate SSI Increase Lantus to 10 units daily Discontinue metformin XR 750 mg daily, discontinue Amaryl  HLD -Lipid panel shows that LDL> 70 mg/dL recommended by the ADA guidelines -Increase pravastatin to 60 mg daily  Hypothyroidism -1/4 TSH= 9.04 Dose recently adjusted by PCP therefore continue with 125 g per day  Hypertension:  - Continue HCTZ 25 mg daily -continue lisinopril 20 mg daily    Code Status: Full  Family Communication: None Disposition Plan: I and D this afternoon, hopefully home tomorrow  Consultants: Dr. Sabra Heck   Procedures: 1/8 I & D of left lower extremity abscess   Cultures None  Antibiotics: Levofloxacin 1/9>> Vancomycin 1/9>>    HPI/Subjective: 79 yo WF PMHx DM type 2, HTN,  SVT, hyperlipidemia, hypothyroidism c/o left leg redness and swelling x 1 week. Pt was tx with keflex which gave her itching. Pt had u/s at orthopedics which was negative for DVT. Pt presented to the ED for evaluation and Dr. Sabra Heck performed and I and D. And found blood. No pus. Denies fever, chills. Pt will be admitted for cellulitis.  1/10 A/O x 4 sitting in chair comfortably, states does have pain with ambulation    Objective: Filed Vitals:   12/29/14 1300 12/29/14 2100 12/30/14 0500 12/30/14 0531  BP: 137/76 123/97  122/65  Pulse: 92 73  70  Temp: 98 F (36.7 C) 97.8 F (36.6 C)  97.5 F (36.4 C)  TempSrc:  Oral  Oral  Resp: 18 16  16   Height:      Weight:   102.059 kg (225 lb)   SpO2: 100% 99%  100%    Intake/Output Summary (Last 24 hours) at 12/30/14 1153 Last data filed at 12/30/14 0800  Gross per 24 hour  Intake    480 ml  Output      0 ml  Net    480 ml   Filed Weights   12/28/14 0540 12/29/14 0453 12/30/14 0500  Weight: 96.879 kg (213 lb 9.3 oz) 98.793 kg (217 lb 12.8 oz) 102.059 kg (225 lb)     Exam: General: A/O 4, NAD, No acute respiratory distress Lungs: Clear to auscultation bilaterally without wheezes or crackles Cardiovascular: Regular rate and rhythm without murmur gallop or rub normal S1 and S2 Abdomen: Nontender, nondistended, soft, bowel sounds positive, no rebound, no ascites, no appreciable mass  Extremities: No significant cyanosis, clubbing, or edema right lower extremities. Left lower extremity has a open incision where I and D was performed, copious serosanguineous drainage; abscess tract is approximately 5 cm at its longest from the incision opening   Data Reviewed: Basic Metabolic Panel:  Recent Labs Lab 12/26/14 2253 12/27/14 0507 12/28/14 0924 12/29/14 0450 12/30/14 0555  NA 141 134* 138 137 133*  K 4.3 3.7 4.0 3.9 3.7  CL 103 104 106 109 104  CO2  --  23 23 24 24   GLUCOSE 164* 247* 226* 102* 166*  BUN 48* 41* 31* 27*  30*  CREATININE 1.40* 1.25* 1.32* 1.09 1.10  CALCIUM  --  8.5 8.8 8.1* 8.2*  MG  --   --  1.6 1.5 1.5   Liver Function Tests:  Recent Labs Lab 12/27/14 0507 12/28/14 0924 12/29/14 0450 12/30/14 0555  AST 17 18 16 18   ALT 16 13 12 14   ALKPHOS 111 101 92 96  BILITOT 0.5 0.5 0.5 0.5  PROT 5.7* 5.2* 4.8* 5.5*  ALBUMIN 3.2* 2.9* 2.6* 3.0*   No results for input(s): LIPASE, AMYLASE in the last 168 hours. No results for input(s): AMMONIA in the last 168 hours. CBC:  Recent Labs Lab 12/26/14 2236 12/26/14 2253 12/27/14 0507 12/28/14 0924 12/29/14 0450 12/30/14 0555  WBC 14.3*  --  12.6* 11.6* 10.8* 11.7*  NEUTROABS 9.9*  --  8.3* 8.0* 7.1 10.6*  HGB 13.5 14.6 12.2 12.4 11.8* 12.7  HCT 40.6 43.0 37.4 38.1 36.7 38.7  MCV 84.9  --  83.7 84.7 84.4 84.9  PLT 340  --  321 263 270 296   Cardiac Enzymes: No results for input(s): CKTOTAL, CKMB, CKMBINDEX, TROPONINI in the last 168 hours. BNP (last 3 results) No results for input(s): PROBNP in the last 8760 hours. CBG:  Recent Labs Lab 12/29/14 2007 12/29/14 2348 12/30/14 0411 12/30/14 0753 12/30/14 1139  GLUCAP 118* 180* 161* 138* 160*    Recent Results (from the past 240 hour(s))  Culture, blood (routine x 2)     Status: None (Preliminary result)   Collection Time: 12/28/14 11:50 AM  Result Value Ref Range Status   Specimen Description RIGHT ANTECUBITAL  Final   Special Requests BOTTLES DRAWN AEROBIC ONLY 5CC  Final   Culture   Final           BLOOD CULTURE RECEIVED NO GROWTH TO DATE CULTURE WILL BE HELD FOR 5 DAYS BEFORE ISSUING A FINAL NEGATIVE REPORT Performed at [facility]    Report Status PENDING  Incomplete  Culture, blood (routine x 2)     Status: None (Preliminary result)   Collection Time: 12/28/14 12:00 PM  Result Value Ref Range Status   Specimen Description BLOOD RIGHT HAND  Final   Special Requests BOTTLES DRAWN AEROBIC ONLY 5CC  Final   Culture   Final           BLOOD CULTURE RECEIVED NO  GROWTH TO DATE CULTURE WILL BE HELD FOR 5 DAYS BEFORE ISSUING A FINAL NEGATIVE REPORT Performed at [facility]    Report Status PENDING  Incomplete  Wound culture     Status: None (Preliminary result)   Collection Time: 12/28/14  5:48 PM  Result Value Ref Range Status   Specimen Description WOUND LEFT LEG  Final   Special Requests NONE  Final   Gram Stain PENDING  Incomplete   Culture   Final    NO GROWTH 1 DAY Performed at [facility]    Report Status PENDING  Incomplete     Studies: Mr Tibia Fibula Left Wo Contrast  12/28/2014   CLINICAL DATA:  79 year old female who is a diabetic who presents with left lower extremity swelling after bumping her anterior leg 2 weeks ago when she fell up the steps. She started with a small amount of redness which has progressed to involve her entire left lower extremity from the knee through the foot and is now associated with blistering to the right and left lateral ankles.  EXAM: MRI OF LOWER LEFT EXTREMITY WITHOUT CONTRAST  TECHNIQUE: Multiplanar, multisequence MR imaging of the left lower leg was performed. No intravenous contrast was administered.  COMPARISON:  None.  FINDINGS: No marrow signal abnormality. No fracture or dislocation. No periosteal reaction. There is no focal muscle signal abnormality. The fascial planes are maintained.  Generalized edema within the subcutaneous fat of the left lower leg from the knee to the level of the ankle with overlying skin thickening. There has been an incision in and drainage of the left anterior lower leg. There is a 1 x 3.8 x 4 cm fluid collection in the subcutaneous fat of the mid anterior left lower leg. There is a focal area of fluid signal overlying the lateral malleolus likely representing a skin blister. There is a focal area of fluid signal overlying the lateral malleolus likely representing a skin blister.  There is no other fluid collection or hematoma.  IMPRESSION: 1. Cellulitis of the  left lower leg with a 1 x 3.8 x 4 cm fluid collection in the mid anterior lower leg subcutaneous fat concerning for an abscess. No evidence up osteomyelitis or myositis. 2. Skin blisters over the lateral malleoli bilaterally.   Electronically Signed   By: Kathreen Devoid   On: 12/28/2014 14:13    Scheduled Meds: . calcium-vitamin D  1 tablet Oral Daily  . ciprofloxacin  400 mg Intravenous Q12H  . hydrochlorothiazide  25 mg Oral Daily  . insulin aspart  0-15 Units Subcutaneous 6 times per day  . insulin glargine  10 Units Subcutaneous Daily  . [START ON 12/31/2014] levothyroxine  125 mcg Oral QAC breakfast  . lisinopril  20 mg Oral Daily  . magnesium oxide  400 mg Oral BID  . metoprolol  50 mg Oral BID  . pravastatin  60 mg Oral Daily  . [START ON 12/31/2014] predniSONE  20 mg Oral Q breakfast  . sodium chloride  3 mL Intravenous Q12H  . vancomycin  1,250 mg Intravenous Q24H   Continuous Infusions: . sodium chloride 125 mL/hr at 12/28/14 2152    Active Problems:   Essential hypertension   Hyperglycemia   Hyperlipidemia   Cellulitis of left lower extremity   Cellulitis   Diabetes type 2, uncontrolled   Other specified hypothyroidism   Acute renal failure syndrome    Time spent: 40 minutes  [place] [facility] Pager [phone]. If 7PM-7AM, please contact night-coverage at [email or web address], password [facility] 12/30/2014, 11:53 AM  LOS: 4 days

## 2014-12-30 NOTE — Progress Notes (Signed)
VASCULAR LAB PRELIMINARY  PRELIMINARY  PRELIMINARY  PRELIMINARY  ABI's  completed.    Preliminary report:  Normal triphasic waveforms and ABI'S bilaterally.    RT ABI: 1.17   LT ABI: 1.20  Lindwood Coke, RVT 12/30/2014, 6:04 PM

## 2014-12-31 ENCOUNTER — Telehealth: Payer: Self-pay | Admitting: Family Medicine

## 2014-12-31 LAB — CBC WITH DIFFERENTIAL/PLATELET
Basophils Absolute: 0 10*3/uL (ref 0.0–0.1)
Basophils Relative: 0 % (ref 0–1)
EOS ABS: 0.1 10*3/uL (ref 0.0–0.7)
EOS PCT: 1 % (ref 0–5)
HCT: 34.7 % — ABNORMAL LOW (ref 36.0–46.0)
Hemoglobin: 11.5 g/dL — ABNORMAL LOW (ref 12.0–15.0)
LYMPHS ABS: 1.2 10*3/uL (ref 0.7–4.0)
LYMPHS PCT: 8 % — AB (ref 12–46)
MCH: 27.7 pg (ref 26.0–34.0)
MCHC: 33.1 g/dL (ref 30.0–36.0)
MCV: 83.6 fL (ref 78.0–100.0)
Monocytes Absolute: 0.6 10*3/uL (ref 0.1–1.0)
Monocytes Relative: 4 % (ref 3–12)
NEUTROS PCT: 87 % — AB (ref 43–77)
Neutro Abs: 12.3 10*3/uL — ABNORMAL HIGH (ref 1.7–7.7)
PLATELETS: 286 10*3/uL (ref 150–400)
RBC: 4.15 MIL/uL (ref 3.87–5.11)
RDW: 13 % (ref 11.5–15.5)
WBC: 14.1 10*3/uL — AB (ref 4.0–10.5)

## 2014-12-31 LAB — WOUND CULTURE: Culture: NO GROWTH

## 2014-12-31 LAB — GLUCOSE, CAPILLARY
GLUCOSE-CAPILLARY: 181 mg/dL — AB (ref 70–99)
GLUCOSE-CAPILLARY: 212 mg/dL — AB (ref 70–99)
Glucose-Capillary: 163 mg/dL — ABNORMAL HIGH (ref 70–99)
Glucose-Capillary: 149 mg/dL — ABNORMAL HIGH (ref 70–99)

## 2014-12-31 LAB — COMPREHENSIVE METABOLIC PANEL
ALT: 18 U/L (ref 0–35)
ANION GAP: 6 (ref 5–15)
AST: 18 U/L (ref 0–37)
Albumin: 3 g/dL — ABNORMAL LOW (ref 3.5–5.2)
Alkaline Phosphatase: 93 U/L (ref 39–117)
BUN: 30 mg/dL — ABNORMAL HIGH (ref 6–23)
CO2: 25 mmol/L (ref 19–32)
CREATININE: 1.21 mg/dL — AB (ref 0.50–1.10)
Calcium: 8.4 mg/dL (ref 8.4–10.5)
Chloride: 102 mEq/L (ref 96–112)
GFR calc Af Amer: 49 mL/min — ABNORMAL LOW (ref >90)
GFR calc non Af Amer: 42 mL/min — ABNORMAL LOW (ref >90)
Glucose, Bld: 204 mg/dL — ABNORMAL HIGH (ref 70–99)
Potassium: 3.7 mmol/L (ref 3.5–5.1)
Sodium: 133 mmol/L — ABNORMAL LOW (ref 135–145)
TOTAL PROTEIN: 5.2 g/dL — AB (ref 6.0–8.3)
Total Bilirubin: 0.4 mg/dL (ref 0.3–1.2)

## 2014-12-31 LAB — MAGNESIUM: MAGNESIUM: 1.9 mg/dL (ref 1.5–2.5)

## 2014-12-31 MED ORDER — CIPROFLOXACIN HCL 500 MG PO TABS
500.0000 mg | ORAL_TABLET | Freq: Two times a day (BID) | ORAL | Status: DC
  Filled 2014-12-31 (×2): qty 1

## 2014-12-31 NOTE — Consult Note (Addendum)
WOC wound consult note Reason for Consult: Cellulitis/Abcess to Left Lower Leg  Wound type: Full thickness Pressure Ulcer POA: N/A Measurement: Lateral wound (open area) measures 2.1X.75X0.2 cm with 4cm tunneling noted upwards towards knee and 1cm undermining noted from 1-4.  Medial wound (eschared area) measures 1.5X2cm.  Wound bed: Lateral wound:100% red, beefy. Medial wound covered with black eschar.   Drainage (amount, consistency, odor): No drainage noted. Periwound: Skin slightly reddened, dry and flaky.  Previous blisters noted below wound to the medial and lateral aspects of the ankle which is swollen have ruptured and drained mod amt yellow fluid, intact skin over previous sites.   Dressing procedure/placement/frequency: Removed iodoform packing from lateral wound. Sharp debridement performed to medial wound which left an area of continuation between the wounds.  Dry fluffed 2X2 was packed into the wounds and covered with dry 4X4 and wrapped with ACE bandage.  Blistered areas to the ankle protected with  foam dressing applied to areas both medially and laterally.   Recommend dressing change daily  with dry gauze packing strip to left leg wound, cover with 4X4 and wrap with ACE bandage to decrease edema.  Recommend home health for dressing change assistance and outpatient wound care center follow up.Discussed plan of care with primary team; they plan to order home health and wound center appointment for follow-up. For blistered areas, recommend foam dressings to be changed Q 5 days and prn for drainage.    Conservative sharp wound debridement (CSWD performed at the bedside): Conservative sharp debridement performed with scapel and forceps.  Pt tolerated without c/o pain or bleeding.  Removed all of eschar and wound now moist and red and 2 areas communicate.  Pt appears to be well-informed regarding topical treatment and denies further questions. Orson Gear, RN-BSN, Graduate Student Please  re-consult if further assistance is needed.  Thank-you,  Julien Girt MSN, [place], [place], [place], [place]

## 2014-12-31 NOTE — Progress Notes (Signed)
[facility] Progress Note  Subjective  No new issues  Sitting in bedside chair   ABI's look good   Review of Systems  Constitutional: Negative for fever and chills.  Respiratory: Negative for shortness of breath and wheezing.   Cardiovascular: Negative for chest pain and palpitations.  Gastrointestinal: Negative for nausea and vomiting.     Objective   BP 102/54 mmHg  Pulse 59  Temp(Src) 98.2 F (36.8 C) (Oral)  Resp 16  Ht 5\' 6"  (1.676 m)  Wt 103.511 kg (228 lb 3.2 oz)  BMI 36.85 kg/m2  SpO2 95%  Intake/Output      01/12 0701 - 01/13 0700 01/13 0701 - 01/14 0700   P.O. 240    Total Intake(mL/kg) 240 (2.3)    Net +240          Urine Occurrence 1 x      Labs   Results for SHERRELLE, PROCHAZKA (MRN [id number]) as of 12/31/2014 08:28  Ref. Range 12/31/2014 05:20  WBC Latest Range: 4.0-10.5 K/uL 14.1 (H)  RBC Latest Range: 3.87-5.11 MIL/uL 4.15  Hemoglobin Latest Range: 12.0-15.0 g/dL 11.5 (L)  HCT Latest Range: 36.0-46.0 % 34.7 (L)  MCV Latest Range: 78.0-100.0 fL 83.6  MCH Latest Range: 26.0-34.0 pg 27.7  MCHC Latest Range: 30.0-36.0 g/dL 33.1  RDW Latest Range: 11.5-15.5 % 13.0  Platelets Latest Range: 150-400 K/uL 286    Exam  Gen: sitting in bedside chair, coloring  Ext:       Left Lower Extremity   Dressing stable   Motor and sensory functions intact  Ext warm   + DP pulse     Assessment and Plan   POD/HD#: 33   79 year old female numerous comorbidities with cellulitis to left leg and and abscess  1. Left leg cellulitis and abscess  Pt and family ultimately declined surgery yesterday  Requested wound care consult  Ordered placed  Still think best option given comorbidities is repeat I&D and VAC                2. Medical issues             Per primary service             Patient will need optimize her blood sugar control long-term  3. FEN             as tolerated   4. Disposition             pending wound care eval      Jari Pigg, PA-C [facility] [phone] (O) 12/31/2014 8:45 AM

## 2014-12-31 NOTE — Evaluation (Signed)
Occupational Therapy Evaluation Patient Details Name: Michaela Rhodes MRN: [id number] DOB: 10/10/1936 Today's Date: 12/31/2014    History of Present Illness Pt is a 79 yo female with dm2, htn, hyperlipidemia, hypothyroidism apparently c/o left leg redness and swelling x 1 week. Pt was tx with keflex which gave her itching. Pt had u/s at orthopedics which was negative for DVT. Pt presented to the ED for evaluation and Dr. Sabra Heck performed and I and D. Pt admitted for cellulitis.    Clinical Impression   Pt is a 79 y/o female admitted as above, she is currently Mod I ADL's and functional mobility. She states that her son and daughter in law will be able to assist PRN intermittently at d/c. She also states that she has 2 grandchildren that can assist PRN. She reports no further acute OT needs at this time, will sign off.    Follow Up Recommendations  No OT follow up;Supervision - Intermittent    Equipment Recommendations  Other (comment) (None recommended, pt states no needs)    Recommendations for Other Services       Precautions / Restrictions Precautions Precautions: Fall      Mobility Bed Mobility               General bed mobility comments: Pt sitting up in recliner upon PT arrival.   Transfers Overall transfer level: Modified independent Equipment used: None Transfers: Sit to/from [facility] Sit to Stand: Modified independent (Device/Increase time) Stand pivot transfers: Modified independent (Device/Increase time)       General transfer comment: No unsteadiness noted    Balance Overall balance assessment: Modified Independent Sitting-balance support: Feet supported;No upper extremity supported Sitting balance-Leahy Scale: Normal     Standing balance support: No upper extremity supported;During functional activity Standing balance-Leahy Scale: Good (During grooming tasks standing at sink)                              ADL  Overall ADL's : Modified independent                                       General ADL Comments: Pt don/doffed socks sitting, transferred into bathroom and on/off toilet and stood at sink for grooming w/o AD at Mod I level. She states that her son and daughter in law will be able to assist PRN intermittently at d/c. She also states that she has 2 grandchildren that can assist PRN. She reports no further acute OT needs at this time.     Vision  Wears glasses at all times, no change from baseline.                   Perception     Praxis      Pertinent Vitals/Pain Pain Assessment: No/denies pain     Hand Dominance Right   Extremity/Trunk Assessment Upper Extremity Assessment Upper Extremity Assessment: RUE deficits/detail RUE Deficits / Details: Pt reports chronic shoulder pain s/p shoulder replacement x3 yrs ago and AROM/strength are limited at eval.    Lower Extremity Assessment Lower Extremity Assessment: Defer to PT evaluation;Overall [facility] for tasks assessed   Cervical / Trunk Assessment Cervical / Trunk Assessment: Normal   Communication Communication Communication: No difficulties   Cognition Arousal/Alertness: Awake/alert Behavior During Therapy: WFL for tasks assessed/performed Overall Cognitive Status: Within Functional Limits for tasks  assessed                                   Home Living Family/patient expects to be discharged to:: Private residence   Available Help at Discharge: Family;Available PRN/intermittently Type of Home: House Home Access: Stairs to enter [employer] of Steps: 6 Entrance Stairs-Rails: Right Home Layout: Able to live on main level with bedroom/bathroom     Bathroom Shower/Tub: Walk-in shower (upstairs)   Biochemist, clinical: Standard     Home Equipment: None   Additional Comments: Son and daughter-in-law live very close and check in on her often       Prior Functioning/Environment  Level of Independence: Independent             OT Diagnosis: Other (comment) (LLE celliluitis, LLE edema)   OT Problem List:     OT Treatment/Interventions:      OT Goals(Current goals can be found in the care plan section) Acute Rehab OT Goals Patient Stated Goal: Go home later today  OT Frequency:     Barriers to D/C:            Co-evaluation              End of Session    Activity Tolerance: Patient tolerated treatment well Patient left: in chair;with call bell/phone within reach   Time: 1122-1141 OT Time Calculation (min): 19 min Charges:  OT General Charges $OT Visit: 1 Procedure OT Evaluation $Initial OT Evaluation Tier I: 1 Procedure OT Treatments $Self Care/Home Management : 8-22 mins G-Codes:    Josephine Igo Dixon, OTR/L 12/31/2014, 11:47 AM

## 2014-12-31 NOTE — Telephone Encounter (Signed)
yes

## 2014-12-31 NOTE — Progress Notes (Signed)
CARE MANAGEMENT NOTE 12/31/2014  Patient:  Michaela Rhodes, Michaela Rhodes   Account Number:  [id number]  Date Initiated:  12/31/2014  Documentation initiated by:  [facility]  Subjective/Objective Assessment:   left leg cellulitis     Action/Plan:   PT/OT evals-recommended rolling walker, patient refused- stated that she did not think that she would need one   Anticipated DC Date:  12/31/2014   Anticipated DC Plan:  [place]  CM consult      [facility] Choice  HOME HEALTH   Choice offered to / List presented to:  C-1 Patient        [place] arranged  HH-1 RN      [place].   Status of service:  Completed, signed off Medicare Important Message given?  YES (If response is "NO", the following Medicare IM given date fields will be blank) Date Medicare IM given:  12/31/2014 Medicare IM given by:  [facility] Date Additional Medicare IM given:   Additional Medicare IM given by:    Discharge Disposition:  [place]  Per UR Regulation:  Reviewed for med. necessity/level of care/duration of stay  If discussed at [place] of Stay Meetings, dates discussed:    Comments:  12/31/14 Made patient appt at [facility] per Dr. Ledell Peoples request for first available appt, 01/09/15 at 10:15. Spoke with patient about appt and HHC. She selected [facility]. Contacted Miranda at [place] nad set up [facility] for assistance with drsg changes. Explained PT recommended rolling walker but patient refused a walker stating that she did not believe that she would need one at home. Fuller Plan RN, BSN, CCM

## 2014-12-31 NOTE — Progress Notes (Signed)
PT Cancellation Note  Patient Details Name: GEROLDINE ESQUIVIAS MRN: [id number] DOB: Jul 10, 1936   Cancelled Treatment:    Reason Eval/Treat Not Completed: Other (comment)  Politely declined; Going home today; states she has a cane and will use it prn;  Thanks,  Roney Marion, PT  Acute Rehabilitation Services Pager [phone] Office [phone]'   [place] 12/31/2014, 11:24 AM

## 2014-12-31 NOTE — Telephone Encounter (Signed)
Patient is going to start home care with [facility] on 01/01/15. Michaela Rhodes needs to know if Dr. Elease Hashimoto is going to be her attending.  Ok to leave message on voicemail if needed.

## 2014-12-31 NOTE — Discharge Summary (Signed)
Physician Discharge Summary  Michaela Rhodes MRN: [id number] DOB/AGE: February 16, 1936 79 y.o.  PCP: [name], MD   Admit date: 12/26/2014 Discharge date: 12/31/2014  Discharge Diagnoses:      Essential hypertension   Hyperglycemia   Hyperlipidemia   Cellulitis of left lower extremity   Cellulitis   Diabetes type 2, uncontrolled   Other specified hypothyroidism   Acute renal failure syndrome  Follow recommendations suggested by [facility] consult Follow up with PCP in one week Follow-up with wound care clinic as scheduled CBC, BMP in one week       Medication List    STOP taking these medications        cephALEXin 500 MG capsule  Commonly known as:  [place] these medications        calcium-vitamin D 500-200 MG-UNIT per tablet  Commonly known as:  OSCAL WITH D  Take 1 tablet by mouth daily.     ciprofloxacin 500 MG tablet  Commonly known as:  CIPRO  Take 1 tablet (500 mg total) by mouth 2 (two) times daily.     doxycycline 50 MG capsule  Commonly known as:  VIBRAMYCIN  Take 1 capsule (50 mg total) by mouth 2 (two) times daily.     glimepiride 2 MG tablet  Commonly known as:  AMARYL  Take 1 tablet (2 mg total) by mouth daily with breakfast.     levothyroxine 125 MCG tablet  Commonly known as:  SYNTHROID, LEVOTHROID  Take 1 tablet (125 mcg total) by mouth daily before breakfast.     lisinopril-hydrochlorothiazide 20-25 MG per tablet  Commonly known as:  PRINZIDE,ZESTORETIC  Take 1 tablet by mouth daily.     metFORMIN 750 MG 24 hr tablet  Commonly known as:  GLUCOPHAGE XR  Take 1 tablet (750 mg total) by mouth daily with breakfast.     metoprolol 50 MG tablet  Commonly known as:  LOPRESSOR  Take 1 tablet (50 mg total) by mouth 2 (two) times daily.     pravastatin 40 MG tablet  Commonly known as:  PRAVACHOL  Take 1 tablet (40 mg total) by mouth daily.     predniSONE 20 MG tablet  Commonly known as:  DELTASONE  Take 1 tablet (20 mg  total) by mouth daily with breakfast.     traMADol-acetaminophen 37.5-325 MG per tablet  Commonly known as:  ULTRACET  Take 1 tablet by mouth every 6 (six) hours as needed.        Discharge Condition: Stable Disposition: 03-Skilled Nursing Facility   Consults: Orthopedics    Significant Diagnostic Studies: Dg Tibia/fibula Left  12/26/2014   CLINICAL DATA:  Fall up stairs 2 weeks ago, injuring left tib-fib. Swelling that was drained. Open wound on the mid anterior part of the left tib-fib. Blisters on the lateral part of the ankle. Redness.  EXAM: LEFT TIBIA AND FIBULA - 2 VIEW  COMPARISON:  None.  FINDINGS: Diffusely increased density throughout the soft tissues suggesting soft tissue edema. Nodular soft tissue prominence in the ankle region suggesting skin lesions. Correlation with physical examination is suggested. Focal soft tissue gas collection anterior to the tibia at the mid/ distal shaft region. This may be sequela of recent drainage procedure or may indicate soft tissue infection. Underlying bones appear intact. No evidence of bone destruction or cortical loss to suggest osteomyelitis. No evidence of acute fracture or dislocation. Degenerative changes in the left knee.  IMPRESSION: Diffuse soft tissue  edema in the left lower leg. Focal gas collections adjacent to the left anterior medial tibial midshaft may represent postprocedural change or soft tissue infection. Multiple skin lesions suggested at the ankles. No acute bony abnormalities.   Electronically Signed   By: Lucienne Capers M.D.   On: 12/26/2014 23:55   Mr Tibia Fibula Left Wo Contrast  12/28/2014   CLINICAL DATA:  79 year old female who is a diabetic who presents with left lower extremity swelling after bumping her anterior leg 2 weeks ago when she fell up the steps. She started with a small amount of redness which has progressed to involve her entire left lower extremity from the knee through the foot and is now associated  with blistering to the right and left lateral ankles.  EXAM: MRI OF LOWER LEFT EXTREMITY WITHOUT CONTRAST  TECHNIQUE: Multiplanar, multisequence MR imaging of the left lower leg was performed. No intravenous contrast was administered.  COMPARISON:  None.  FINDINGS: No marrow signal abnormality. No fracture or dislocation. No periosteal reaction. There is no focal muscle signal abnormality. The fascial planes are maintained.  Generalized edema within the subcutaneous fat of the left lower leg from the knee to the level of the ankle with overlying skin thickening. There has been an incision in and drainage of the left anterior lower leg. There is a 1 x 3.8 x 4 cm fluid collection in the subcutaneous fat of the mid anterior left lower leg. There is a focal area of fluid signal overlying the lateral malleolus likely representing a skin blister. There is a focal area of fluid signal overlying the lateral malleolus likely representing a skin blister.  There is no other fluid collection or hematoma.  IMPRESSION: 1. Cellulitis of the left lower leg with a 1 x 3.8 x 4 cm fluid collection in the mid anterior lower leg subcutaneous fat concerning for an abscess. No evidence up osteomyelitis or myositis. 2. Skin blisters over the lateral malleoli bilaterally.   Electronically Signed   By: Kathreen Devoid   On: 12/28/2014 14:13   Dg Ankle Left Port  12/30/2014   CLINICAL DATA:  79 year old ankle swelling  EXAM: PORTABLE LEFT ANKLE - 2 VIEW  COMPARISON:  None.  FINDINGS: There is no evidence of an acute fracture or dislocation. Ankle soft tissue swelling is present. No suspicious periosteal reaction or bone lesion is seen. Posterior and plantar calcaneal enthesophytes are noted. The visualized forefoot is unremarkable. The bone mineralization is mildly diminished.  IMPRESSION: 1. Ankle soft tissue swelling without evidence of an acute fracture or dislocation.  2.  Calcaneal spurs.   Electronically Signed   By: Rosemarie Ax    On: 12/30/2014 13:10       Microbiology: Recent Results (from the past 240 hour(s))  Culture, blood (routine x 2)     Status: None (Preliminary result)   Collection Time: 12/28/14 11:50 AM  Result Value Ref Range Status   Specimen Description RIGHT ANTECUBITAL  Final   Special Requests BOTTLES DRAWN AEROBIC ONLY 5CC  Final   Culture   Final           BLOOD CULTURE RECEIVED NO GROWTH TO DATE CULTURE WILL BE HELD FOR 5 DAYS BEFORE ISSUING A FINAL NEGATIVE REPORT Performed at [facility]    Report Status PENDING  Incomplete  Culture, blood (routine x 2)     Status: None (Preliminary result)   Collection Time: 12/28/14 12:00 PM  Result Value Ref Range Status   Specimen  Description BLOOD RIGHT HAND  Final   Special Requests BOTTLES DRAWN AEROBIC ONLY 5CC  Final   Culture   Final           BLOOD CULTURE RECEIVED NO GROWTH TO DATE CULTURE WILL BE HELD FOR 5 DAYS BEFORE ISSUING A FINAL NEGATIVE REPORT Performed at [facility]    Report Status PENDING  Incomplete  Wound culture     Status: None   Collection Time: 12/28/14  5:48 PM  Result Value Ref Range Status   Specimen Description WOUND LEFT LEG  Final   Special Requests NONE  Final   Gram Stain   Final    RARE WBC PRESENT, PREDOMINANTLY PMN NO SQUAMOUS EPITHELIAL CELLS SEEN NO ORGANISMS SEEN Performed at [facility]    Culture   Final    NO GROWTH 2 DAYS Performed at [facility]    Report Status 12/31/2014 FINAL  Final     Labs: Results for orders placed or performed during the hospital encounter of 12/26/14 (from the past 48 hour(s))  Glucose, capillary     Status: Abnormal   Collection Time: 12/29/14  4:21 PM  Result Value Ref Range   Glucose-Capillary 181 (H) 70 - 99 mg/dL   Comment 1 Notify RN   Glucose, capillary     Status: Abnormal   Collection Time: 12/29/14  8:07 PM  Result Value Ref Range   Glucose-Capillary 118 (H) 70 - 99 mg/dL  Glucose, capillary     Status: Abnormal    Collection Time: 12/29/14 11:48 PM  Result Value Ref Range   Glucose-Capillary 180 (H) 70 - 99 mg/dL  Glucose, capillary     Status: Abnormal   Collection Time: 12/30/14  4:11 AM  Result Value Ref Range   Glucose-Capillary 161 (H) 70 - 99 mg/dL  Comprehensive metabolic panel     Status: Abnormal   Collection Time: 12/30/14  5:55 AM  Result Value Ref Range   Sodium 133 (L) 135 - 145 mmol/L    Comment: Please note change in reference range.   Potassium 3.7 3.5 - 5.1 mmol/L    Comment: Please note change in reference range.   Chloride 104 96 - 112 mEq/L   CO2 24 19 - 32 mmol/L   Glucose, Bld 166 (H) 70 - 99 mg/dL   BUN 30 (H) 6 - 23 mg/dL   Creatinine, Ser 9.32 0.50 - 1.10 mg/dL   Calcium 8.2 (L) 8.4 - 10.5 mg/dL   Total Protein 5.5 (L) 6.0 - 8.3 g/dL   Albumin 3.0 (L) 3.5 - 5.2 g/dL   AST 18 0 - 37 U/L   ALT 14 0 - 35 U/L   Alkaline Phosphatase 96 39 - 117 U/L   Total Bilirubin 0.5 0.3 - 1.2 mg/dL   GFR calc non Af Amer 48 (L) >90 mL/min   GFR calc Af Amer 55 (L) >90 mL/min    Comment: (NOTE) The eGFR has been calculated using the CKD EPI equation. This calculation has not been validated in all clinical situations. eGFR's persistently <90 mL/min signify possible Chronic Kidney Disease.    Anion gap 5 5 - 15  CBC with Differential     Status: Abnormal   Collection Time: 12/30/14  5:55 AM  Result Value Ref Range   WBC 11.7 (H) 4.0 - 10.5 K/uL   RBC 4.56 3.87 - 5.11 MIL/uL   Hemoglobin 12.7 12.0 - 15.0 g/dL   HCT 04.2 89.3 - 57.6 %  MCV 84.9 78.0 - 100.0 fL   MCH 27.9 26.0 - 34.0 pg   MCHC 32.8 30.0 - 36.0 g/dL   RDW 12.9 11.5 - 15.5 %   Platelets 296 150 - 400 K/uL   Neutrophils Relative % 91 (H) 43 - 77 %   Neutro Abs 10.6 (H) 1.7 - 7.7 K/uL   Lymphocytes Relative 8 (L) 12 - 46 %   Lymphs Abs 0.9 0.7 - 4.0 K/uL   Monocytes Relative 1 (L) 3 - 12 %   Monocytes Absolute 0.1 0.1 - 1.0 K/uL   Eosinophils Relative 0 0 - 5 %   Eosinophils Absolute 0.0 0.0 - 0.7 K/uL    Basophils Relative 0 0 - 1 %   Basophils Absolute 0.0 0.0 - 0.1 K/uL  Magnesium     Status: None   Collection Time: 12/30/14  5:55 AM  Result Value Ref Range   Magnesium 1.5 1.5 - 2.5 mg/dL  Glucose, capillary     Status: Abnormal   Collection Time: 12/30/14  7:53 AM  Result Value Ref Range   Glucose-Capillary 138 (H) 70 - 99 mg/dL   Comment 1 Notify RN    Comment 2 Documented in Chart   Glucose, capillary     Status: Abnormal   Collection Time: 12/30/14 11:39 AM  Result Value Ref Range   Glucose-Capillary 160 (H) 70 - 99 mg/dL   Comment 1 Notify RN    Comment 2 Documented in Chart   Glucose, capillary     Status: None   Collection Time: 12/30/14  4:18 PM  Result Value Ref Range   Glucose-Capillary 98 70 - 99 mg/dL   Comment 1 Notify RN    Comment 2 Documented in Chart   Glucose, capillary     Status: Abnormal   Collection Time: 12/30/14  9:58 PM  Result Value Ref Range   Glucose-Capillary 245 (H) 70 - 99 mg/dL  Glucose, capillary     Status: Abnormal   Collection Time: 12/31/14 12:32 AM  Result Value Ref Range   Glucose-Capillary 212 (H) 70 - 99 mg/dL  Glucose, capillary     Status: Abnormal   Collection Time: 12/31/14  4:42 AM  Result Value Ref Range   Glucose-Capillary 181 (H) 70 - 99 mg/dL  Comprehensive metabolic panel     Status: Abnormal   Collection Time: 12/31/14  5:20 AM  Result Value Ref Range   Sodium 133 (L) 135 - 145 mmol/L    Comment: Please note change in reference range.   Potassium 3.7 3.5 - 5.1 mmol/L    Comment: Please note change in reference range.   Chloride 102 96 - 112 mEq/L   CO2 25 19 - 32 mmol/L   Glucose, Bld 204 (H) 70 - 99 mg/dL   BUN 30 (H) 6 - 23 mg/dL   Creatinine, Ser 1.21 (H) 0.50 - 1.10 mg/dL   Calcium 8.4 8.4 - 10.5 mg/dL   Total Protein 5.2 (L) 6.0 - 8.3 g/dL   Albumin 3.0 (L) 3.5 - 5.2 g/dL   AST 18 0 - 37 U/L   ALT 18 0 - 35 U/L   Alkaline Phosphatase 93 39 - 117 U/L   Total Bilirubin 0.4 0.3 - 1.2 mg/dL   GFR calc non Af  Amer 42 (L) >90 mL/min   GFR calc Af Amer 49 (L) >90 mL/min    Comment: (NOTE) The eGFR has been calculated using the CKD EPI equation. This calculation has not been validated  in all clinical situations. eGFR's persistently <90 mL/min signify possible Chronic Kidney Disease.    Anion gap 6 5 - 15  CBC with Differential     Status: Abnormal   Collection Time: 12/31/14  5:20 AM  Result Value Ref Range   WBC 14.1 (H) 4.0 - 10.5 K/uL   RBC 4.15 3.87 - 5.11 MIL/uL   Hemoglobin 11.5 (L) 12.0 - 15.0 g/dL   HCT 34.7 (L) 36.0 - 46.0 %   MCV 83.6 78.0 - 100.0 fL   MCH 27.7 26.0 - 34.0 pg   MCHC 33.1 30.0 - 36.0 g/dL   RDW 13.0 11.5 - 15.5 %   Platelets 286 150 - 400 K/uL   Neutrophils Relative % 87 (H) 43 - 77 %   Neutro Abs 12.3 (H) 1.7 - 7.7 K/uL   Lymphocytes Relative 8 (L) 12 - 46 %   Lymphs Abs 1.2 0.7 - 4.0 K/uL   Monocytes Relative 4 3 - 12 %   Monocytes Absolute 0.6 0.1 - 1.0 K/uL   Eosinophils Relative 1 0 - 5 %   Eosinophils Absolute 0.1 0.0 - 0.7 K/uL   Basophils Relative 0 0 - 1 %   Basophils Absolute 0.0 0.0 - 0.1 K/uL  Magnesium     Status: None   Collection Time: 12/31/14  5:20 AM  Result Value Ref Range   Magnesium 1.9 1.5 - 2.5 mg/dL  Glucose, capillary     Status: Abnormal   Collection Time: 12/31/14  8:12 AM  Result Value Ref Range   Glucose-Capillary 163 (H) 70 - 99 mg/dL   Comment 1 Notify RN    Comment 2 Documented in Chart   Glucose, capillary     Status: Abnormal   Collection Time: 12/31/14 12:05 PM  Result Value Ref Range   Glucose-Capillary 149 (H) 70 - 99 mg/dL   Comment 1 Notify RN    Comment 2 Documented in Chart      HPI :IZZABELLA BESSE is an 79 y.o. white female with history of diabetes, hypothyroidism and hypertension who sustained an injury approximately 3 weeks ago. Patient states that she tripped going up a set of steps. This resulted in a wound to her left anterior shin. It appeared to be completely superficial. Patient to use some  over-the-counter ointments for treatment. This did not resolve. She subsequently went to her primary care physician who felt that it was a contact dermatitis from an agent that she use that was over-the-counter. She was given Depo-Medrol at the first visit. Several days later she presented back to the PCP with progressive worsening of her symptoms. On this presentation it appeared to be more cellulitic in nature. She was then referred for Doppler which was negative for DVT. She was also started on Keflex which caused her some itching. She was ultimately referred to orthopedics who recommended that she be admitted to medicine for IV antibiotics. Patient was seen in the emergency department on 12/26/2014. I&D was performed in the emergency department. No cultures were sent at that time. Blood cultures and wound cultures were sent on 12/28/2014. Patient has been on vancomycin and fluoroquinolone since admission. She was started on Levaquin but is since been converted to Cipro IV. MRI of her tibia was performed to evaluate the possibility of osteomyelitis given her comorbidities. No osseous was evident however persistent abscess was noted in the subcutaneous tissue. Orthopedics consultation to evaluate and treat her persistent abscess.  HOSPITAL COURSE: Cellulitis -Culture no growth so  far -Treated with iv vanco and change IV Levaquin to iv ciprofloxacin Due to more activity against Pseudomonas aeruginos. Switch to Cipro and doxycycline by mouth for 2 additional weeks -Blood culture x2 sets no growth so far -Wound culture no growth so far -Pack incision site with plain iodoform, change packing daily MRI of left lower extremity shows Cellulitis of the left lower leg with a 1 x 3.8 x 4 cm fluid collection in the mid anterior lower leg subcutaneous fat concerning for an abscess.  Patient had I and D to drain abcess in ED at the time of admission Consulted orthopedics, Dr. Marcelino Scot. Per their recommendations  the patient may need an I and D to be done , but patient declined after consultation with her daughter who is an ER physician Wound care consultation recommend the following change daily with dry gauze packing strip to left leg wound, cover with 4X4 and wrap with ACE bandage to decrease edema. Recommend home health for dressing change assistance and outpatient wound care center follow up For blistered areas, recommend foam dressings to be changed Q 5 days and prn for drainage.  Conservative sharp wound debridement (CSWD performed at the bedside): Conservative sharp debridement performed with scapel and forceps. Pt tolerated without c/o pain or bleeding. Removed all of eschar and wound now moist and red and 2 areas communicate Home health RN being set up for wound care    Hives due to keflex Resolved with IV Solu-Medrol and prednisone   ARF: Improving -Mild renal insufficiency Completed IV fluid hydration   Diabetes type 2 uncontrolled -1/4 hemoglobin A1c= 10.1 -Patient is on 2 hyperglycemic medications and has failed to meet the ADA recommended hemoglobin A1c<7 Recommend PCP to review diabetic regimen    HLD -Lipid panel shows that LDL> 70 mg/dL recommended by the ADA guidelines Recommend PCP to adjust statin therapy   Hypothyroidism -1/4 TSH= 9.04 Dose recently adjusted by PCP therefore continue with 125 g per day  Hypertension:  - Continue HCTZ 25 mg daily -continue lisinopril 20 mg daily    Discharge Exam:  Blood pressure 132/62, pulse 82, temperature 98.2 F (36.8 C), temperature source Oral, resp. rate 16, height _0  (1.676 m), weight 103.511 kg (228 lb 3.2 oz), SpO2 95 %. General: A/O 4, NAD, No acute respiratory distress Lungs: Clear to auscultation bilaterally without wheezes or crackles Cardiovascular: Regular rate and rhythm without murmur gallop or rub normal S1 and S2 Abdomen: Nontender, nondistended, soft, bowel sounds positive, no rebound, no  ascites, no appreciable mass Extremities: No significant cyanosis, clubbing, or edema right lower extremities. Left lower extremity has a open incision where I and D was performed, copious serosanguineous drainage; abscess tract is approximately 5 cm at its longest from the incision opening        Discharge Instructions    Diet - low sodium heart healthy    Complete by:  As directed      Diet - low sodium heart healthy    Complete by:  As directed      Increase activity slowly    Complete by:  As directed      Increase activity slowly    Complete by:  As directed            Follow-up Information    Follow up with [facility]              On 01/09/2015.   Why:  Appointment at 10:15am 01/09/15  Contact information:   [address] [phone]      Follow up with [facility].   Why:  They will contact you to schedule home nurse visits.   Contact information:   [address] [place] [address] [phone]       Follow up with [name], MD. Schedule an appointment as soon as possible for a visit in 1 week.   Specialty:  Family Medicine   Contact information:   [place] [address] [phone]       Signed: Reyne Dumas 12/31/2014, 12:55 PM

## 2015-01-01 ENCOUNTER — Encounter (HOSPITAL_COMMUNITY): Payer: Self-pay | Admitting: Orthopedic Surgery

## 2015-01-01 DIAGNOSIS — L02416 Cutaneous abscess of left lower limb: Secondary | ICD-10-CM | POA: Diagnosis not present

## 2015-01-01 DIAGNOSIS — Z48817 Encounter for surgical aftercare following surgery on the skin and subcutaneous tissue: Secondary | ICD-10-CM | POA: Diagnosis not present

## 2015-01-01 DIAGNOSIS — T361X5S Adverse effect of cephalosporins and other beta-lactam antibiotics, sequela: Secondary | ICD-10-CM | POA: Diagnosis not present

## 2015-01-01 DIAGNOSIS — E1165 Type 2 diabetes mellitus with hyperglycemia: Secondary | ICD-10-CM | POA: Diagnosis not present

## 2015-01-01 DIAGNOSIS — L03116 Cellulitis of left lower limb: Secondary | ICD-10-CM | POA: Diagnosis not present

## 2015-01-01 DIAGNOSIS — L500 Allergic urticaria: Secondary | ICD-10-CM | POA: Diagnosis not present

## 2015-01-01 LAB — C-REACTIVE PROTEIN: CRP: <0.5 mg/dL — ABNORMAL LOW (ref <0.60)

## 2015-01-01 NOTE — Telephone Encounter (Signed)
Michaela Rhodes is informed.

## 2015-01-02 DIAGNOSIS — Z48817 Encounter for surgical aftercare following surgery on the skin and subcutaneous tissue: Secondary | ICD-10-CM | POA: Diagnosis not present

## 2015-01-02 DIAGNOSIS — L03116 Cellulitis of left lower limb: Secondary | ICD-10-CM | POA: Diagnosis not present

## 2015-01-02 DIAGNOSIS — E1165 Type 2 diabetes mellitus with hyperglycemia: Secondary | ICD-10-CM | POA: Diagnosis not present

## 2015-01-02 DIAGNOSIS — L02416 Cutaneous abscess of left lower limb: Secondary | ICD-10-CM | POA: Diagnosis not present

## 2015-01-02 DIAGNOSIS — L500 Allergic urticaria: Secondary | ICD-10-CM | POA: Diagnosis not present

## 2015-01-02 DIAGNOSIS — T361X5S Adverse effect of cephalosporins and other beta-lactam antibiotics, sequela: Secondary | ICD-10-CM | POA: Diagnosis not present

## 2015-01-03 DIAGNOSIS — E1165 Type 2 diabetes mellitus with hyperglycemia: Secondary | ICD-10-CM | POA: Diagnosis not present

## 2015-01-03 DIAGNOSIS — L03116 Cellulitis of left lower limb: Secondary | ICD-10-CM | POA: Diagnosis not present

## 2015-01-03 DIAGNOSIS — Z48817 Encounter for surgical aftercare following surgery on the skin and subcutaneous tissue: Secondary | ICD-10-CM | POA: Diagnosis not present

## 2015-01-03 DIAGNOSIS — L500 Allergic urticaria: Secondary | ICD-10-CM | POA: Diagnosis not present

## 2015-01-03 DIAGNOSIS — T361X5S Adverse effect of cephalosporins and other beta-lactam antibiotics, sequela: Secondary | ICD-10-CM | POA: Diagnosis not present

## 2015-01-03 DIAGNOSIS — L02416 Cutaneous abscess of left lower limb: Secondary | ICD-10-CM | POA: Diagnosis not present

## 2015-01-03 LAB — CULTURE, BLOOD (ROUTINE X 2)
Culture: NO GROWTH
Culture: NO GROWTH

## 2015-01-08 DIAGNOSIS — E1165 Type 2 diabetes mellitus with hyperglycemia: Secondary | ICD-10-CM | POA: Diagnosis not present

## 2015-01-08 DIAGNOSIS — T361X5S Adverse effect of cephalosporins and other beta-lactam antibiotics, sequela: Secondary | ICD-10-CM | POA: Diagnosis not present

## 2015-01-08 DIAGNOSIS — L03116 Cellulitis of left lower limb: Secondary | ICD-10-CM | POA: Diagnosis not present

## 2015-01-08 DIAGNOSIS — Z48817 Encounter for surgical aftercare following surgery on the skin and subcutaneous tissue: Secondary | ICD-10-CM | POA: Diagnosis not present

## 2015-01-08 DIAGNOSIS — L500 Allergic urticaria: Secondary | ICD-10-CM | POA: Diagnosis not present

## 2015-01-08 DIAGNOSIS — L02416 Cutaneous abscess of left lower limb: Secondary | ICD-10-CM | POA: Diagnosis not present

## 2015-01-09 ENCOUNTER — Encounter (HOSPITAL_BASED_OUTPATIENT_CLINIC_OR_DEPARTMENT_OTHER): Payer: Commercial Managed Care - HMO | Attending: Internal Medicine

## 2015-01-09 DIAGNOSIS — L97821 Non-pressure chronic ulcer of other part of left lower leg limited to breakdown of skin: Secondary | ICD-10-CM | POA: Insufficient documentation

## 2015-01-12 DIAGNOSIS — E11622 Type 2 diabetes mellitus with other skin ulcer: Secondary | ICD-10-CM | POA: Diagnosis not present

## 2015-01-12 DIAGNOSIS — L97909 Non-pressure chronic ulcer of unspecified part of unspecified lower leg with unspecified severity: Secondary | ICD-10-CM | POA: Diagnosis not present

## 2015-01-12 DIAGNOSIS — L97821 Non-pressure chronic ulcer of other part of left lower leg limited to breakdown of skin: Secondary | ICD-10-CM | POA: Diagnosis not present

## 2015-01-13 DIAGNOSIS — Z48817 Encounter for surgical aftercare following surgery on the skin and subcutaneous tissue: Secondary | ICD-10-CM | POA: Diagnosis not present

## 2015-01-13 DIAGNOSIS — L03116 Cellulitis of left lower limb: Secondary | ICD-10-CM | POA: Diagnosis not present

## 2015-01-13 DIAGNOSIS — L02416 Cutaneous abscess of left lower limb: Secondary | ICD-10-CM | POA: Diagnosis not present

## 2015-01-13 DIAGNOSIS — L500 Allergic urticaria: Secondary | ICD-10-CM | POA: Diagnosis not present

## 2015-01-13 NOTE — Progress Notes (Signed)
[facility]  NAME:  Michaela Rhodes, Michaela Rhodes               ACCOUNT NO.:  [id number]  MEDICAL RECORD NO.:  [id number]      DATE OF BIRTH:  May 05, 1936  PHYSICIAN:  Theodoro Kos, DO       VISIT DATE:  01/12/2015                                  OFFICE VISIT   HISTORY OF PRESENT ILLNESS:  The patient is a 79 year old female who fell going up the steps and sustained trauma to her left lower extremity anterior shin area before Christmas.  She has undergone local care without improvement.  She had some cellulitis of the lower leg, for which, she was admitted and given antibiotics.  She then had a reaction to the antibiotics and broke out in a rash.  She is now at home.  PAST MEDICAL HISTORY:  Positive for: 1. Hypoglycemia. 2. Hyperlipidemia. 3. Cellulitis. 4. Diabetes. 5. Acute renal failure. 6. Hypertension. 7. Thyroid disease 8. Panic attacks.  MEDICATIONS:  Include calcium, glimepiride, levothyroxine, lisinopril, metformin, metoprolol, pravastatin, prednisone, tramadol.  She had several studies done, which we reviewed including MR of the tib- fib area of the left leg to rule out any additional trauma.  There was no evidence of osteomyelitis or myositis.  She did have cellulitis with the fluid collection that was concerning for an abscess.  Her lab work was reviewed as well.  She did have an elevated white count on January 09, 2015.  PAST SURGICAL HISTORY:  Appendectomy, cholecystectomy, left mastectomy, abdominal hysterectomy, breast reconstruction with left latissimus, open reduction and internal fixation of shoulder fracture, total shoulder arthroplasty.  ALLERGIES:  PENICILLIN.  PHYSICAL EXAMINATION:  On exam, she is alert, oriented, cooperative, not in any distress.  I did review her lower extremity Dopplers as well, which were negative for DVT.  She is very pleasant, no sign of infection at present.  Her breathing is unlabored.  Her heart rate is  regular. Her abdomen is soft.  She does have a rash of the upper thoracic area and neck, which she states from the antibiotic, but no sign of infection.  She has very good upper and lower extremity pulses.  She has a little bit of edema, but not much.  No cellulitis noted at this time. The wound sizes are noted undermining as well.  I think she would benefit from debridement and placement of ACell in the OR.  In the meantime, we will check a prealbumin.  Her albumin was low and recommend elevation, multivitamin, vitamin C, and zinc.  Protein supplements, strict blood sugar control and we will arrange for operative intervention.     Theodoro Kos, DO     CS/MEDQ  D:  01/12/2015  T:  01/13/2015  Job:  [id number]

## 2015-01-14 ENCOUNTER — Encounter (HOSPITAL_BASED_OUTPATIENT_CLINIC_OR_DEPARTMENT_OTHER): Payer: Self-pay | Admitting: *Deleted

## 2015-01-14 NOTE — Progress Notes (Signed)
Was in hospital recently-last labe 12/31/14-but renal funtions still elevated-may need istat

## 2015-01-15 ENCOUNTER — Ambulatory Visit (HOSPITAL_BASED_OUTPATIENT_CLINIC_OR_DEPARTMENT_OTHER): Payer: Commercial Managed Care - HMO | Admitting: Anesthesiology

## 2015-01-15 ENCOUNTER — Encounter (HOSPITAL_BASED_OUTPATIENT_CLINIC_OR_DEPARTMENT_OTHER): Payer: Self-pay | Admitting: Plastic Surgery

## 2015-01-15 ENCOUNTER — Other Ambulatory Visit: Payer: Self-pay | Admitting: Plastic Surgery

## 2015-01-15 ENCOUNTER — Ambulatory Visit (HOSPITAL_BASED_OUTPATIENT_CLINIC_OR_DEPARTMENT_OTHER)
Admission: RE | Admit: 2015-01-15 | Discharge: 2015-01-15 | Disposition: A | Discharge status: Home / Self Care | Payer: Commercial Managed Care - HMO | Source: Ambulatory Visit | Attending: Plastic Surgery | Admitting: Plastic Surgery

## 2015-01-15 ENCOUNTER — Encounter (HOSPITAL_BASED_OUTPATIENT_CLINIC_OR_DEPARTMENT_OTHER)
Admission: RE | Disposition: A | Discharge status: Home / Self Care | Payer: Self-pay | Source: Ambulatory Visit | Attending: Plastic Surgery

## 2015-01-15 DIAGNOSIS — Z79899 Other long term (current) drug therapy: Secondary | ICD-10-CM | POA: Insufficient documentation

## 2015-01-15 DIAGNOSIS — Z6835 Body mass index (BMI) 35.0-35.9, adult: Secondary | ICD-10-CM | POA: Insufficient documentation

## 2015-01-15 DIAGNOSIS — Z8249 Family history of ischemic heart disease and other diseases of the circulatory system: Secondary | ICD-10-CM | POA: Insufficient documentation

## 2015-01-15 DIAGNOSIS — E119 Type 2 diabetes mellitus without complications: Secondary | ICD-10-CM | POA: Insufficient documentation

## 2015-01-15 DIAGNOSIS — E785 Hyperlipidemia, unspecified: Secondary | ICD-10-CM | POA: Diagnosis not present

## 2015-01-15 DIAGNOSIS — N189 Chronic kidney disease, unspecified: Secondary | ICD-10-CM | POA: Insufficient documentation

## 2015-01-15 DIAGNOSIS — I129 Hypertensive chronic kidney disease with stage 1 through stage 4 chronic kidney disease, or unspecified chronic kidney disease: Secondary | ICD-10-CM | POA: Insufficient documentation

## 2015-01-15 DIAGNOSIS — Z7951 Long term (current) use of inhaled steroids: Secondary | ICD-10-CM | POA: Insufficient documentation

## 2015-01-15 DIAGNOSIS — S81802A Unspecified open wound, left lower leg, initial encounter: Secondary | ICD-10-CM | POA: Diagnosis not present

## 2015-01-15 DIAGNOSIS — Y999 Unspecified external cause status: Secondary | ICD-10-CM | POA: Insufficient documentation

## 2015-01-15 DIAGNOSIS — Z833 Family history of diabetes mellitus: Secondary | ICD-10-CM | POA: Diagnosis not present

## 2015-01-15 DIAGNOSIS — R21 Rash and other nonspecific skin eruption: Secondary | ICD-10-CM | POA: Diagnosis not present

## 2015-01-15 DIAGNOSIS — Y939 Activity, unspecified: Secondary | ICD-10-CM | POA: Diagnosis not present

## 2015-01-15 DIAGNOSIS — L97922 Non-pressure chronic ulcer of unspecified part of left lower leg with fat layer exposed: Secondary | ICD-10-CM

## 2015-01-15 DIAGNOSIS — Y929 Unspecified place or not applicable: Secondary | ICD-10-CM | POA: Diagnosis not present

## 2015-01-15 DIAGNOSIS — W19XXXA Unspecified fall, initial encounter: Secondary | ICD-10-CM | POA: Insufficient documentation

## 2015-01-15 DIAGNOSIS — E039 Hypothyroidism, unspecified: Secondary | ICD-10-CM | POA: Insufficient documentation

## 2015-01-15 HISTORY — DX: Presence of spectacles and contact lenses: Z97.3

## 2015-01-15 HISTORY — PX: I&D EXTREMITY: SHX5045

## 2015-01-15 HISTORY — PX: APPLICATION OF A-CELL OF EXTREMITY: SHX6303

## 2015-01-15 LAB — POCT I-STAT, CHEM 8
BUN: 58 mg/dL — AB (ref 6–23)
CALCIUM ION: 1.28 mmol/L (ref 1.13–1.30)
Chloride: 103 mmol/L (ref 96–112)
Creatinine, Ser: 1.4 mg/dL — ABNORMAL HIGH (ref 0.50–1.10)
Glucose, Bld: 219 mg/dL — ABNORMAL HIGH (ref 70–99)
HEMATOCRIT: 42 % (ref 36.0–46.0)
Hemoglobin: 14.3 g/dL (ref 12.0–15.0)
POTASSIUM: 3.7 mmol/L (ref 3.5–5.1)
SODIUM: 139 mmol/L (ref 135–145)
TCO2: 24 mmol/L (ref 0–100)

## 2015-01-15 LAB — GLUCOSE, CAPILLARY: Glucose-Capillary: 177 mg/dL — ABNORMAL HIGH (ref 70–99)

## 2015-01-15 SURGERY — IRRIGATION AND DEBRIDEMENT EXTREMITY
Anesthesia: General | Site: Leg Lower | Laterality: Left

## 2015-01-15 MED ORDER — FENTANYL CITRATE 0.05 MG/ML IJ SOLN
INTRAMUSCULAR | Status: DC | PRN
  Administered 2015-01-15 (×2): 50 ug via INTRAVENOUS

## 2015-01-15 MED ORDER — LACTATED RINGERS IV SOLN
INTRAVENOUS | Status: DC
  Administered 2015-01-15: 12:00:00 via INTRAVENOUS

## 2015-01-15 MED ORDER — SODIUM CHLORIDE 0.9 % IR SOLN
Status: DC | PRN
  Administered 2015-01-15: 500 mL

## 2015-01-15 MED ORDER — OXYCODONE HCL 5 MG/5ML PO SOLN: 5.0000 mg | Freq: Once | ORAL | Status: DC | PRN

## 2015-01-15 MED ORDER — MIDAZOLAM HCL 2 MG/2ML IJ SOLN
INTRAMUSCULAR | Status: AC
  Filled 2015-01-15: qty 2

## 2015-01-15 MED ORDER — LIDOCAINE HCL (CARDIAC) 20 MG/ML IV SOLN
INTRAVENOUS | Status: DC | PRN
  Administered 2015-01-15: 50 mg via INTRAVENOUS

## 2015-01-15 MED ORDER — HYDROMORPHONE HCL 1 MG/ML IJ SOLN: 0.2500 mg | INTRAMUSCULAR | Status: DC | PRN

## 2015-01-15 MED ORDER — PHENYLEPHRINE HCL 10 MG/ML IJ SOLN
INTRAMUSCULAR | Status: DC | PRN
  Administered 2015-01-15: 40 ug via INTRAVENOUS

## 2015-01-15 MED ORDER — CIPROFLOXACIN IN D5W 400 MG/200ML IV SOLN
INTRAVENOUS | Status: AC
  Filled 2015-01-15: qty 200

## 2015-01-15 MED ORDER — PROMETHAZINE HCL 25 MG/ML IJ SOLN: 6.2500 mg | INTRAMUSCULAR | Status: DC | PRN

## 2015-01-15 MED ORDER — MIDAZOLAM HCL 2 MG/2ML IJ SOLN: 1.0000 mg | INTRAMUSCULAR | Status: DC | PRN

## 2015-01-15 MED ORDER — FENTANYL CITRATE 0.05 MG/ML IJ SOLN
INTRAMUSCULAR | Status: AC
Start: 2015-01-15 — End: 2015-01-15
  Filled 2015-01-15: qty 6

## 2015-01-15 MED ORDER — OXYCODONE HCL 5 MG PO TABS: 5.0000 mg | ORAL_TABLET | Freq: Once | ORAL | Status: DC | PRN

## 2015-01-15 MED ORDER — FENTANYL CITRATE 0.05 MG/ML IJ SOLN: 50.0000 ug | INTRAMUSCULAR | Status: DC | PRN

## 2015-01-15 MED ORDER — PROPOFOL 10 MG/ML IV BOLUS
INTRAVENOUS | Status: DC | PRN
  Administered 2015-01-15: 150 mg via INTRAVENOUS

## 2015-01-15 MED ORDER — CIPROFLOXACIN IN D5W 400 MG/200ML IV SOLN: 400.0000 mg | INTRAVENOUS | Status: DC

## 2015-01-15 SURGICAL SUPPLY — 90 items
BAG DECANTER FOR FLEXI CONT (MISCELLANEOUS) ×3 IMPLANT
BANDAGE ELASTIC 3 VELCRO ST LF (GAUZE/BANDAGES/DRESSINGS) IMPLANT
BANDAGE ELASTIC 4 VELCRO ST LF (GAUZE/BANDAGES/DRESSINGS) ×3 IMPLANT
BANDAGE ELASTIC 6 VELCRO ST LF (GAUZE/BANDAGES/DRESSINGS) IMPLANT
BENZOIN TINCTURE PRP APPL 2/3 (GAUZE/BANDAGES/DRESSINGS) IMPLANT
BLADE HEX COATED 2.75 (ELECTRODE) IMPLANT
BLADE MINI RND TIP GREEN BEAV (BLADE) IMPLANT
BLADE SURG 10 STRL SS (BLADE) ×3 IMPLANT
BLADE SURG 15 STRL LF DISP TIS (BLADE) ×1 IMPLANT
BLADE SURG 15 STRL SS (BLADE) ×2
BNDG COHESIVE 1X5 TAN STRL LF (GAUZE/BANDAGES/DRESSINGS) IMPLANT
BNDG COHESIVE 4X5 TAN STRL (GAUZE/BANDAGES/DRESSINGS) IMPLANT
BNDG ESMARK 4X9 LF (GAUZE/BANDAGES/DRESSINGS) IMPLANT
BNDG GAUZE ELAST 4 BULKY (GAUZE/BANDAGES/DRESSINGS) ×3 IMPLANT
CANISTER SUCT 1200ML W/VALVE (MISCELLANEOUS) IMPLANT
CANISTER SUCT 3000ML (MISCELLANEOUS) IMPLANT
CHLORAPREP W/TINT 26ML (MISCELLANEOUS) ×3 IMPLANT
CLOSURE WOUND 1/2 X4 (GAUZE/BANDAGES/DRESSINGS)
CORDS BIPOLAR (ELECTRODE) IMPLANT
COVER BACK TABLE 60X90IN (DRAPES) ×3 IMPLANT
COVER MAYO STAND STRL (DRAPES) ×3 IMPLANT
DECANTER SPIKE VIAL GLASS SM (MISCELLANEOUS) IMPLANT
DRAIN PENROSE 1/2X12 LTX STRL (WOUND CARE) IMPLANT
DRAPE EXTREMITY T 121X128X90 (DRAPE) IMPLANT
DRAPE INCISE IOBAN 66X45 STRL (DRAPES) IMPLANT
DRAPE U-SHAPE 76X120 STRL (DRAPES) ×3 IMPLANT
DRSG ADAPTIC 3X8 NADH LF (GAUZE/BANDAGES/DRESSINGS) IMPLANT
DRSG EMULSION OIL 3X3 NADH (GAUZE/BANDAGES/DRESSINGS) ×3 IMPLANT
DRSG PAD ABDOMINAL 8X10 ST (GAUZE/BANDAGES/DRESSINGS) IMPLANT
ELECT NEEDLE TIP 2.8 STRL (NEEDLE) IMPLANT
ELECT REM PT RETURN 9FT ADLT (ELECTROSURGICAL) ×3
ELECTRODE REM PT RTRN 9FT ADLT (ELECTROSURGICAL) ×1 IMPLANT
GAUZE SPONGE 4X4 12PLY STRL (GAUZE/BANDAGES/DRESSINGS) ×3 IMPLANT
GAUZE XEROFORM 1X8 LF (GAUZE/BANDAGES/DRESSINGS) IMPLANT
GAUZE XEROFORM 5X9 LF (GAUZE/BANDAGES/DRESSINGS) IMPLANT
GLOVE BIO SURGEON STRL SZ 6.5 (GLOVE) ×8 IMPLANT
GLOVE BIO SURGEONS STRL SZ 6.5 (GLOVE) ×4
GLOVE BIOGEL M 7.0 STRL (GLOVE) ×3 IMPLANT
GLOVE BIOGEL PI IND STRL 7.5 (GLOVE) ×1 IMPLANT
GLOVE BIOGEL PI INDICATOR 7.5 (GLOVE) ×2
GOWN STRL REUS W/ TWL LRG LVL3 (GOWN DISPOSABLE) ×4 IMPLANT
GOWN STRL REUS W/TWL LRG LVL3 (GOWN DISPOSABLE) ×8
IV NS IRRIG 3000ML ARTHROMATIC (IV SOLUTION) IMPLANT
MANIFOLD NEPTUNE II (INSTRUMENTS) IMPLANT
MATRIX SURGICAL PSM 7X10CM (Tissue) ×3 IMPLANT
MICROMATRIX 500MG (Tissue) ×3 IMPLANT
NEEDLE HYPO 30GX1 BEV (NEEDLE) IMPLANT
NEEDLE PRECISIONGLIDE 27X1.5 (NEEDLE) IMPLANT
NS IRRIG 1000ML POUR BTL (IV SOLUTION) ×3 IMPLANT
PACK BASIN DAY SURGERY FS (CUSTOM PROCEDURE TRAY) ×3 IMPLANT
PADDING CAST ABS 3INX4YD NS (CAST SUPPLIES)
PADDING CAST ABS 4INX4YD NS (CAST SUPPLIES)
PADDING CAST ABS COTTON 3X4 (CAST SUPPLIES) IMPLANT
PADDING CAST ABS COTTON 4X4 ST (CAST SUPPLIES) IMPLANT
PENCIL BUTTON HOLSTER BLD 10FT (ELECTRODE) IMPLANT
SHEET MEDIUM DRAPE 40X70 STRL (DRAPES) ×3 IMPLANT
SLEEVE SCD COMPRESS KNEE MED (MISCELLANEOUS) ×3 IMPLANT
SOLUTION PARTIC MCRMTRX 500MG (Tissue) ×1 IMPLANT
SPLINT PLASTER CAST XFAST 3X15 (CAST SUPPLIES) IMPLANT
SPLINT PLASTER XTRA FASTSET 3X (CAST SUPPLIES)
SPONGE GAUZE 4X4 12PLY STER LF (GAUZE/BANDAGES/DRESSINGS) ×6 IMPLANT
SPONGE LAP 18X18 X RAY DECT (DISPOSABLE) ×3 IMPLANT
SPONGE LAP 4X18 X RAY DECT (DISPOSABLE) IMPLANT
STAPLER VISISTAT 35W (STAPLE) IMPLANT
STOCKINETTE 4X48 STRL (DRAPES) IMPLANT
STOCKINETTE 6  STRL (DRAPES)
STOCKINETTE 6 STRL (DRAPES) IMPLANT
STOCKINETTE IMPERVIOUS LG (DRAPES) IMPLANT
STRIP CLOSURE SKIN 1/2X4 (GAUZE/BANDAGES/DRESSINGS) IMPLANT
SUCTION FRAZIER TIP 10 FR DISP (SUCTIONS) IMPLANT
SURGILUBE 2OZ TUBE FLIPTOP (MISCELLANEOUS) ×3 IMPLANT
SUT ETHILON 3 0 PS 1 (SUTURE) IMPLANT
SUT ETHILON 4 0 P 3 18 (SUTURE) IMPLANT
SUT ETHILON 5 0 PS 2 18 (SUTURE) IMPLANT
SUT PROLENE 3 0 PS 2 (SUTURE) IMPLANT
SUT SILK 3 0 PS 1 (SUTURE) ×3 IMPLANT
SUT VIC AB 3-0 FS2 27 (SUTURE) ×3 IMPLANT
SUT VIC AB 5-0 P-3 18X BRD (SUTURE) IMPLANT
SUT VIC AB 5-0 P3 18 (SUTURE)
SUT VIC AB 5-0 PS2 18 (SUTURE) ×3 IMPLANT
SYR BULB IRRIGATION 50ML (SYRINGE) ×3 IMPLANT
SYR CONTROL 10ML LL (SYRINGE) IMPLANT
TAPE HYPAFIX 6 X30' (GAUZE/BANDAGES/DRESSINGS)
TAPE HYPAFIX 6X30 (GAUZE/BANDAGES/DRESSINGS) IMPLANT
TOWEL OR 17X24 6PK STRL BLUE (TOWEL DISPOSABLE) ×3 IMPLANT
TRAY DSU PREP LF (CUSTOM PROCEDURE TRAY) ×3 IMPLANT
TUBE CONNECTING 20'X1/4 (TUBING) ×1
TUBE CONNECTING 20X1/4 (TUBING) ×2 IMPLANT
UNDERPAD 30X30 INCONTINENT (UNDERPADS AND DIAPERS) ×3 IMPLANT
YANKAUER SUCT BULB TIP NO VENT (SUCTIONS) IMPLANT

## 2015-01-15 NOTE — H&P (View-Only) (Signed)
[facility]  NAME:  ERIANNA, JOLLY               ACCOUNT NO.:  [id number]  MEDICAL RECORD NO.:  [id number]      DATE OF BIRTH:  12/28/1935  PHYSICIAN:  Theodoro Kos, DO       VISIT DATE:  01/12/2015                                  OFFICE VISIT   HISTORY OF PRESENT ILLNESS:  The patient is a 79 year old female who fell going up the steps and sustained trauma to her left lower extremity anterior shin area before Christmas.  She has undergone local care without improvement.  She had some cellulitis of the lower leg, for which, she was admitted and given antibiotics.  She then had a reaction to the antibiotics and broke out in a rash.  She is now at home.  PAST MEDICAL HISTORY:  Positive for: 1. Hypoglycemia. 2. Hyperlipidemia. 3. Cellulitis. 4. Diabetes. 5. Acute renal failure. 6. Hypertension. 7. Thyroid disease 8. Panic attacks.  MEDICATIONS:  Include calcium, glimepiride, levothyroxine, lisinopril, metformin, metoprolol, pravastatin, prednisone, tramadol.  She had several studies done, which we reviewed including MR of the tib- fib area of the left leg to rule out any additional trauma.  There was no evidence of osteomyelitis or myositis.  She did have cellulitis with the fluid collection that was concerning for an abscess.  Her lab work was reviewed as well.  She did have an elevated white count on January 09, 2015.  PAST SURGICAL HISTORY:  Appendectomy, cholecystectomy, left mastectomy, abdominal hysterectomy, breast reconstruction with left latissimus, open reduction and internal fixation of shoulder fracture, total shoulder arthroplasty.  ALLERGIES:  PENICILLIN.  PHYSICAL EXAMINATION:  On exam, she is alert, oriented, cooperative, not in any distress.  I did review her lower extremity Dopplers as well, which were negative for DVT.  She is very pleasant, no sign of infection at present.  Her breathing is unlabored.  Her heart rate is  regular. Her abdomen is soft.  She does have a rash of the upper thoracic area and neck, which she states from the antibiotic, but no sign of infection.  She has very good upper and lower extremity pulses.  She has a little bit of edema, but not much.  No cellulitis noted at this time. The wound sizes are noted undermining as well.  I think she would benefit from debridement and placement of ACell in the OR.  In the meantime, we will check a prealbumin.  Her albumin was low and recommend elevation, multivitamin, vitamin C, and zinc.  Protein supplements, strict blood sugar control and we will arrange for operative intervention.     Theodoro Kos, DO     CS/MEDQ  D:  01/12/2015  T:  01/13/2015  Job:  [id number]

## 2015-01-15 NOTE — Discharge Instructions (Signed)
Apply KY gel to leg every other day   Post Anesthesia Home Care Instructions  Activity: Get plenty of rest for the remainder of the day. A responsible adult should stay with you for 24 hours following the procedure.  For the next 24 hours, DO NOT: -Drive a car -Paediatric nurse -Drink alcoholic beverages -Take any medication unless instructed by your physician -Make any legal decisions or sign important papers.  Meals: Start with liquid foods such as gelatin or soup. Progress to regular foods as tolerated. Avoid greasy, spicy, heavy foods. If nausea and/or vomiting occur, drink only clear liquids until the nausea and/or vomiting subsides. Call your physician if vomiting continues.  Special Instructions/Symptoms: Your throat may feel dry or sore from the anesthesia or the breathing tube placed in your throat during surgery. If this causes discomfort, gargle with warm salt water. The discomfort should disappear within 24 hours.

## 2015-01-15 NOTE — Transfer of Care (Signed)
Immediate Anesthesia Transfer of Care Note  Patient: Michaela Rhodes  Procedure(s) Performed: Procedure(s): IRRIGATION AND DEBRIDEMENT EXTREMITY (Left) APPLICATION OF A-CELL OF EXTREMITY (Left)  Patient Location: PACU  Anesthesia Type:General  Level of Consciousness: awake, alert  and oriented  Airway & Oxygen Therapy: Patient Spontanous Breathing and Patient connected to face mask oxygen  Post-op Assessment: Report given to PACU RN and Post -op Vital signs reviewed and stable  Post vital signs: Reviewed and stable  Last Vitals:  Filed Vitals:   01/15/15 1248  BP:   Pulse: 107  Temp: 36.4 C  Resp: 16    Complications: No apparent anesthesia complications

## 2015-01-15 NOTE — Anesthesia Preprocedure Evaluation (Addendum)
Anesthesia Evaluation  Patient identified by MRN, date of birth, ID band Patient awake    Reviewed: Allergy & Precautions, NPO status , Patient's Chart, lab work & pertinent test results  History of Anesthesia Complications Negative for: history of anesthetic complications  Airway Mallampati: II  TM Distance: >3 FB Neck ROM: Full    Dental  (+) Teeth Intact, Dental Advisory Given   Pulmonary neg pulmonary ROS,    Pulmonary exam normal       Cardiovascular hypertension, Pt. on medications and Pt. on home beta blockers     Neuro/Psych Anxiety negative neurological ROS     GI/Hepatic negative GI ROS, Neg liver ROS,   Endo/Other  diabetesHypothyroidism Morbid obesity  Renal/GU Renal InsufficiencyRenal disease     Musculoskeletal   Abdominal   Peds  Hematology   Anesthesia Other Findings   Reproductive/Obstetrics                            Anesthesia Physical Anesthesia Plan  ASA: III  Anesthesia Plan: General   Post-op Pain Management:    Induction: Intravenous  Airway Management Planned: LMA  Additional Equipment:   Intra-op Plan:   Post-operative Plan: Extubation in OR  Informed Consent: I have reviewed the patients History and Physical, chart, labs and discussed the procedure including the risks, benefits and alternatives for the proposed anesthesia with the patient or authorized representative who has indicated his/her understanding and acceptance.   Dental advisory given  Plan Discussed with:   Anesthesia Plan Comments:        Anesthesia Quick Evaluation

## 2015-01-15 NOTE — Interval H&P Note (Signed)
History and Physical Interval Note:  01/15/2015 7:31 AM  Michaela Rhodes  has presented today for surgery, with the diagnosis of non healing wound lower left leg  The various methods of treatment have been discussed with the patient and family. After consideration of risks, benefits and other options for treatment, the patient has consented to  Procedure(s): IRRIGATION AND DEBRIDEMENT EXTREMITY (Left) APPLICATION OF A-CELL OF EXTREMITY (Left) as a surgical intervention .  The patient's history has been reviewed, patient examined, no change in status, stable for surgery.  I have reviewed the patient's chart and labs.  Questions were answered to the patient's satisfaction.     SANGER,CLAIRE

## 2015-01-15 NOTE — Anesthesia Procedure Notes (Signed)
Procedure Name: LMA Insertion Date/Time: 01/15/2015 12:08 PM Performed by: Melynda Ripple D Pre-anesthesia Checklist: Patient identified, Emergency Drugs available, Suction available, Patient being monitored and Timeout performed Patient Re-evaluated:Patient Re-evaluated prior to inductionOxygen Delivery Method: Circle System Utilized Preoxygenation: Pre-oxygenation with 100% oxygen Intubation Type: IV induction Ventilation: Mask ventilation without difficulty LMA: LMA inserted LMA Size: 4.0 Number of attempts: 1 Airway Equipment and Method: Bite block Placement Confirmation: positive ETCO2 Tube secured with: Tape Dental Injury: Teeth and Oropharynx as per pre-operative assessment

## 2015-01-15 NOTE — Anesthesia Postprocedure Evaluation (Signed)
Anesthesia Post Note  Patient: Michaela Rhodes  Procedure(s) Performed: Procedure(s) (LRB): IRRIGATION AND DEBRIDEMENT EXTREMITY (Left) APPLICATION OF A-CELL OF EXTREMITY (Left)  Anesthesia type: general  Patient location: PACU  Post pain: Pain level controlled  Post assessment: Patient's Cardiovascular Status Stable  Last Vitals:  Filed Vitals:   01/15/15 1330  BP: 102/49  Pulse: 78  Temp:   Resp: 11    Post vital signs: Reviewed and stable  Level of consciousness: sedated  Complications: No apparent anesthesia complications

## 2015-01-15 NOTE — H&P (Signed)
Michaela Rhodes is an 79 y.o. female.   Chief Complaint: left leg wound HPI: The patient is a 79 year old female who fell going up the steps and sustained trauma to her left lower extremity anterior shin area before Christmas. She has undergone local care without improvement. She had some cellulitis of the lower leg, for which, she was admitted and given antibiotics. She then had a reaction to the antibiotics and broke out in a rash. She is now at home.   Past Medical History  Diagnosis Date  . Hypertension   . Hypothyroidism   . Panic attacks     mild  . SVT (supraventricular tachycardia)     in the past  . DM2 (diabetes mellitus, type 2)   . Hyperlipidemia   . Wears glasses     Past Surgical History  Procedure Laterality Date  . Appendectomy    . Cholecystectomy    . Abdominal hysterectomy      BSO as well  . Reconstruction breast w/ latissimus dorsi flap    . Total shoulder arthroplasty  05/27/2012    Procedure: TOTAL SHOULDER ARTHROPLASTY;  Surgeon: Johnny Bridge, MD;  Location: [place] [facility];  Service: Orthopedics;  Laterality: Right;  . Mastectomy  1985    Left    Family History  Problem Relation Age of Onset  . Coronary artery disease      family hx of  . Diabetes      family hx of  . Hypertension      family hx of  . Heart disease Mother   . Hypertension Mother   . Stroke Father   . Hypertension Father   . Hypertension Sister    Social History:  reports that she has never smoked. She does not have any smokeless tobacco history on file. She reports that she does not drink alcohol or use illicit drugs.  Allergies:  Allergies  Allergen Reactions  . Penicillins Anaphylaxis and Hives  . Keflex [Cephalexin]     Rash     No prescriptions prior to admission    No results found for this or any previous visit (from the past 30 hour(s)). No results found.  Review of Systems  Constitutional: Negative.   HENT: Negative.   Eyes: Negative.   Respiratory:  Negative.   Cardiovascular: Negative.   Gastrointestinal: Negative.   Genitourinary: Negative.   Musculoskeletal: Negative.   Neurological: Negative.   Psychiatric/Behavioral: Negative.     Height 5\' 6"  (1.676 m), weight 102.967 kg (227 lb). Physical Exam  Constitutional: She is oriented to person, place, and time. She appears well-developed and well-nourished.  HENT:  Head: Normocephalic and atraumatic.  Eyes: Conjunctivae are normal. Pupils are equal, round, and reactive to light.  Cardiovascular: Normal rate.   Respiratory: Effort normal.  Neurological: She is alert and oriented to person, place, and time.  Skin: Skin is warm.  Psychiatric: She has a normal mood and affect. Her behavior is normal. Thought content normal.     Assessment/Plan debridement of left leg with Acell placement.  [place] 01/15/2015, 7:33 AM

## 2015-01-15 NOTE — Brief Op Note (Signed)
01/15/2015  12:40 PM  PATIENT:  Michaela Rhodes  79 y.o. female  PRE-OPERATIVE DIAGNOSIS:  non healing wound lower left leg  POST-OPERATIVE DIAGNOSIS:  non healing wound lower left leg  PROCEDURE:  Procedure(s): IRRIGATION AND DEBRIDEMENT EXTREMITY (Left) APPLICATION OF A-CELL OF EXTREMITY (Left)  SURGEON:  Surgeon(s) and Role:    * Claire Sanger, DO - Primary  PHYSICIAN ASSISTANT: Shawn Rayburn, PA  ASSISTANTS: none   ANESTHESIA:   general  EBL:  Total I/O In: 300 [I.V.:300] Out: -   BLOOD ADMINISTERED:none  DRAINS: none   LOCAL MEDICATIONS USED:  NONE  SPECIMEN:  No Specimen  DISPOSITION OF SPECIMEN:  N/A  COUNTS:  YES  TOURNIQUET:  * No tourniquets in log *  DICTATION: .Dragon Dictation  PLAN OF CARE: Discharge to home after PACU  PATIENT DISPOSITION:  PACU - hemodynamically stable.   Delay start of Pharmacological VTE agent (>24hrs) due to surgical blood loss or risk of bleeding: no

## 2015-01-15 NOTE — Op Note (Signed)
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  SURGEON: Theodoro Kos, DO  ASSISTANT: Shawn Rayburn, PA  ANESTHESIA:  General.   COMPLICATIONS: None.   INDICATIONS FOR PROCEDURE:  The patient, Michaela Rhodes is a 79 y.o. female born on 1936-02-12, is here for treatment of left leg ulcer after trauma. MRN: [id number]  CONSENT:  Informed consent was obtained directly from the patient. Risks, benefits and alternatives were fully discussed. Specific risks including but not limited to bleeding, infection, hematoma, seroma, scarring, pain, infection, contracture, asymmetry, wound healing problems, and need for further surgery were all discussed. The patient did have an ample opportunity to have questions answered to satisfaction.   DESCRIPTION OF PROCEDURE:  The patient was taken to the operating room. A SCD was placed. The patient's operative site was prepped and draped in a sterile fashion. A time out was performed and all information was confirmed to be correct.  General anesthesia was administered.  The area 4 x 3 x 1 with 7 cm of undermining was debrided sharply with #10 blade and a currette. The area was irrigated with antibiotic solution. Hemostasis was achieved with electrocautery.  The Acell powder 1 gm and sheet 7 x 10 cm was applied and secured with 5-0 Vicryl.  An adaptic was placed and KY gel placed.  The patient tolerated the procedure well.  There were no complications. The patient was allowed to wake from anesthesia, extubated and taken to the recovery room in satisfactory condition.

## 2015-01-16 ENCOUNTER — Encounter (HOSPITAL_BASED_OUTPATIENT_CLINIC_OR_DEPARTMENT_OTHER): Payer: Self-pay | Admitting: Plastic Surgery

## 2015-01-16 DIAGNOSIS — E1165 Type 2 diabetes mellitus with hyperglycemia: Secondary | ICD-10-CM | POA: Diagnosis not present

## 2015-01-16 DIAGNOSIS — T361X5S Adverse effect of cephalosporins and other beta-lactam antibiotics, sequela: Secondary | ICD-10-CM | POA: Diagnosis not present

## 2015-01-16 DIAGNOSIS — L02416 Cutaneous abscess of left lower limb: Secondary | ICD-10-CM | POA: Diagnosis not present

## 2015-01-16 DIAGNOSIS — L500 Allergic urticaria: Secondary | ICD-10-CM | POA: Diagnosis not present

## 2015-01-16 DIAGNOSIS — L03116 Cellulitis of left lower limb: Secondary | ICD-10-CM | POA: Diagnosis not present

## 2015-01-16 DIAGNOSIS — Z48817 Encounter for surgical aftercare following surgery on the skin and subcutaneous tissue: Secondary | ICD-10-CM | POA: Diagnosis not present

## 2015-01-19 ENCOUNTER — Encounter (HOSPITAL_BASED_OUTPATIENT_CLINIC_OR_DEPARTMENT_OTHER): Payer: Commercial Managed Care - HMO | Attending: Plastic Surgery

## 2015-01-19 DIAGNOSIS — L97909 Non-pressure chronic ulcer of unspecified part of unspecified lower leg with unspecified severity: Secondary | ICD-10-CM | POA: Insufficient documentation

## 2015-01-22 DIAGNOSIS — Z48817 Encounter for surgical aftercare following surgery on the skin and subcutaneous tissue: Secondary | ICD-10-CM | POA: Diagnosis not present

## 2015-01-22 DIAGNOSIS — L03116 Cellulitis of left lower limb: Secondary | ICD-10-CM | POA: Diagnosis not present

## 2015-01-22 DIAGNOSIS — L02416 Cutaneous abscess of left lower limb: Secondary | ICD-10-CM | POA: Diagnosis not present

## 2015-01-22 DIAGNOSIS — E1165 Type 2 diabetes mellitus with hyperglycemia: Secondary | ICD-10-CM | POA: Diagnosis not present

## 2015-01-22 DIAGNOSIS — L500 Allergic urticaria: Secondary | ICD-10-CM | POA: Diagnosis not present

## 2015-01-22 DIAGNOSIS — T361X5S Adverse effect of cephalosporins and other beta-lactam antibiotics, sequela: Secondary | ICD-10-CM | POA: Diagnosis not present

## 2015-01-26 DIAGNOSIS — L97909 Non-pressure chronic ulcer of unspecified part of unspecified lower leg with unspecified severity: Secondary | ICD-10-CM | POA: Diagnosis not present

## 2015-01-26 NOTE — Progress Notes (Signed)
[facility]  NAME:  Michaela Rhodes, Michaela Rhodes               ACCOUNT NO.:  [id number]  MEDICAL RECORD NO.:  [id number]      DATE OF BIRTH:  March 06, 1936  PHYSICIAN:  Theodoro Kos, DO       VISIT DATE:  01/26/2015                                  OFFICE VISIT   The patient is a 78 year old female, who is here for followup on her left lower extremity wound that is chronic, but she got up from trauma. She underwent debridement with placement of ACell in the OR and has been doing her dressings at home.  PHYSICAL EXAMINATION:  She is alert and oriented, cooperative, not in any distress.  She is very pleasant.  Her breathing is unlabored and her heart rate is regular.  There is on exam no sign of infection.  The Adaptic was removed and seems to be incorporating well.  There was a little bit of serous drainage under and that was removed, so the plan will be continue with Adaptic KY jelly and put it on there daily elevation which she had been doing and follow up in 1 week.     Theodoro Kos, DO     CS/MEDQ  D:  01/26/2015  T:  01/26/2015  Job:  [id number]

## 2015-02-09 DIAGNOSIS — L97909 Non-pressure chronic ulcer of unspecified part of unspecified lower leg with unspecified severity: Secondary | ICD-10-CM | POA: Diagnosis not present

## 2015-02-12 DIAGNOSIS — L97909 Non-pressure chronic ulcer of unspecified part of unspecified lower leg with unspecified severity: Secondary | ICD-10-CM | POA: Diagnosis not present

## 2015-02-23 ENCOUNTER — Encounter (HOSPITAL_BASED_OUTPATIENT_CLINIC_OR_DEPARTMENT_OTHER): Payer: Commercial Managed Care - HMO | Attending: Plastic Surgery

## 2015-02-23 DIAGNOSIS — L97821 Non-pressure chronic ulcer of other part of left lower leg limited to breakdown of skin: Secondary | ICD-10-CM | POA: Insufficient documentation

## 2015-03-09 DIAGNOSIS — L97821 Non-pressure chronic ulcer of other part of left lower leg limited to breakdown of skin: Secondary | ICD-10-CM | POA: Diagnosis not present

## 2015-10-31 ENCOUNTER — Other Ambulatory Visit: Payer: Self-pay | Admitting: Family Medicine

## 2016-01-05 ENCOUNTER — Other Ambulatory Visit: Payer: Self-pay | Admitting: Family Medicine

## 2016-02-26 ENCOUNTER — Ambulatory Visit (INDEPENDENT_AMBULATORY_CARE_PROVIDER_SITE_OTHER): Payer: Commercial Managed Care - HMO | Admitting: Family Medicine

## 2016-02-26 VITALS — BP 170/110 | HR 115 | Temp 97.8°F | Ht 66.0 in | Wt 207.8 lb

## 2016-02-26 DIAGNOSIS — Z780 Asymptomatic menopausal state: Secondary | ICD-10-CM

## 2016-02-26 DIAGNOSIS — E785 Hyperlipidemia, unspecified: Secondary | ICD-10-CM

## 2016-02-26 DIAGNOSIS — E1165 Type 2 diabetes mellitus with hyperglycemia: Secondary | ICD-10-CM

## 2016-02-26 DIAGNOSIS — M40204 Unspecified kyphosis, thoracic region: Secondary | ICD-10-CM

## 2016-02-26 DIAGNOSIS — E039 Hypothyroidism, unspecified: Secondary | ICD-10-CM | POA: Diagnosis not present

## 2016-02-26 DIAGNOSIS — I10 Essential (primary) hypertension: Secondary | ICD-10-CM

## 2016-02-26 LAB — HEPATIC FUNCTION PANEL
ALK PHOS: 94 U/L (ref 39–117)
ALT: 17 U/L (ref 0–35)
AST: 14 U/L (ref 0–37)
Albumin: 4.2 g/dL (ref 3.5–5.2)
BILIRUBIN TOTAL: 0.4 mg/dL (ref 0.2–1.2)
Bilirubin, Direct: 0.1 mg/dL (ref 0.0–0.3)
Total Protein: 6.8 g/dL (ref 6.0–8.3)

## 2016-02-26 LAB — TSH: TSH: 6.62 u[IU]/mL — ABNORMAL HIGH (ref 0.35–4.50)

## 2016-02-26 LAB — BASIC METABOLIC PANEL
BUN: 33 mg/dL — ABNORMAL HIGH (ref 6–23)
CHLORIDE: 104 meq/L (ref 96–112)
CO2: 28 mEq/L (ref 19–32)
CREATININE: 1.08 mg/dL (ref 0.40–1.20)
Calcium: 10.1 mg/dL (ref 8.4–10.5)
GFR: 52.19 mL/min — AB (ref >60.00)
Glucose, Bld: 148 mg/dL — ABNORMAL HIGH (ref 70–99)
Potassium: 4.8 mEq/L (ref 3.5–5.1)
Sodium: 140 mEq/L (ref 135–145)

## 2016-02-26 LAB — LIPID PANEL
CHOLESTEROL: 198 mg/dL (ref 0–200)
HDL: 51.5 mg/dL (ref >39.00)
LDL Cholesterol: 112 mg/dL — ABNORMAL HIGH (ref 0–99)
NonHDL: 146.72
Total CHOL/HDL Ratio: 4
Triglycerides: 174 mg/dL — ABNORMAL HIGH (ref 0.0–149.0)
VLDL: 34.8 mg/dL (ref 0.0–40.0)

## 2016-02-26 LAB — HEMOGLOBIN A1C: Hgb A1c MFr Bld: 8.3 % — ABNORMAL HIGH (ref 4.6–6.5)

## 2016-02-26 MED ORDER — AMLODIPINE BESYLATE 5 MG PO TABS: 5.0000 mg | ORAL_TABLET | Freq: Every day | ORAL | Status: DC

## 2016-02-26 NOTE — Patient Instructions (Signed)
Go ahead and start the Amlodipine Schedule bone density scan We need to follow up in one month to reassess blood pressure

## 2016-02-26 NOTE — Progress Notes (Signed)
Subjective:    Patient ID: Michaela Rhodes, female    DOB: 1936/04/16, 80 y.o.   MRN: [id number]  HPI   Patient not seen here in over one year. Last year she had  Complicated wound infection which finally healed after surgical intervention and aggressive debridement and antibiotics. Chronic problems include obesity, type 2 diabetes, hypothyroidism, hyperlipidemia, hypertension, and remote history of left breast cancer.   Type 2 diabetes. Does not monitor regularly. History of poor control. Last A1c last year 9.9%. Denies any polyuria or polydipsia.   Hypertension treated with lisinopril HCTZ and metoprolol. Does not monitor blood pressure at home. Denies any headaches or dizziness. Compliant with therapy.   Hypothyroidism on levothyroxine. She is overdue for labs. She has hyperlipidemia on pravastatin.   She's noticed some gradual increase in kyphosis over the past several years. No history of known compression fracture.    Past Medical History  Diagnosis Date  . Hypertension   . Hypothyroidism   . Panic attacks     mild  . SVT (supraventricular tachycardia)     in the past  . DM2 (diabetes mellitus, type 2)   . Hyperlipidemia   . Wears glasses    Past Surgical History  Procedure Laterality Date  . Appendectomy    . Cholecystectomy    . Abdominal hysterectomy      BSO as well  . Reconstruction breast w/ latissimus dorsi flap    . Total shoulder arthroplasty  05/27/2012    Procedure: TOTAL SHOULDER ARTHROPLASTY;  Surgeon: Johnny Bridge, MD;  Location: [place] [facility];  Service: Orthopedics;  Laterality: Right;  . Mastectomy  1985    Left  . I&d extremity Left 01/15/2015    Procedure: IRRIGATION AND DEBRIDEMENT EXTREMITY;  Surgeon: Theodoro Kos, DO;  Location: [facility];  Service: Plastics;  Laterality: Left;  . Application of a-cell of extremity Left 01/15/2015    Procedure: APPLICATION OF A-CELL OF EXTREMITY;  Surgeon: Theodoro Kos, DO;  Location: [facility];  Service: Plastics;  Laterality: Left;    reports that she has never smoked. She does not have any smokeless tobacco history on file. She reports that she does not drink alcohol or use illicit drugs. family history includes Heart disease in her mother; Hypertension in her father, mother, and sister; Stroke in her father. Allergies  Allergen Reactions  . Penicillins Anaphylaxis and Hives  . Keflex [Cephalexin]     Rash       Review of Systems  Constitutional: Negative for fever, chills, fatigue and unexpected weight change.  Eyes: Negative for visual disturbance.  Respiratory: Negative for cough, chest tightness, shortness of breath and wheezing.   Cardiovascular: Negative for chest pain, palpitations and leg swelling.  Gastrointestinal: Negative for abdominal pain.  Endocrine: Negative for polydipsia and polyuria.  Genitourinary: Negative for dysuria.  Neurological: Negative for dizziness, seizures, syncope, weakness, light-headedness and headaches.       Objective:   Physical Exam  Constitutional: She appears well-developed and well-nourished.  Neck: Neck supple. No thyromegaly present.  Cardiovascular: Regular rhythm.   She is slightly tachycardic with heart rate around 105 but regular  Pulmonary/Chest: Effort normal and breath sounds normal. No respiratory distress. She has no wheezes. She has no rales.  Musculoskeletal: She exhibits no edema.  She has some definite kyphosis mostly involving the thoracic spine          Assessment & Plan:   #1 type 2 diabetes. History of poor  control. Not monitoring blood sugars regularly. No recent assessment. Recheck A1c. Continue regular eye exams- she states last one was in October 2016.   #2 hypertension. Poorly controlled. Repeat reading right arm seated after rest 168/102 and. Add amlodipine 5 mg daily. Reassess blood pressure here 3-4 weeks   #3 dyslipidemia. Check lipid and hepatic panel   #4 hypothyroidism. Recheck  TSH   #5 kyphosis. Patient not had recent bone density scan. Set up DEXA scan. Discussed taking adequate calcium and vitamin D

## 2016-02-26 NOTE — Progress Notes (Signed)
Pre visit review using our clinic review tool, if applicable. No additional management support is needed unless otherwise documented below in the visit note. 

## 2016-02-27 ENCOUNTER — Other Ambulatory Visit: Payer: Self-pay | Admitting: Family Medicine

## 2016-03-01 MED ORDER — METFORMIN HCL ER 750 MG PO TB24: 750.0000 mg | ORAL_TABLET | Freq: Two times a day (BID) | ORAL | Status: DC

## 2016-03-01 MED ORDER — LEVOTHYROXINE SODIUM 137 MCG PO TABS: 137.0000 ug | ORAL_TABLET | Freq: Every day | ORAL | Status: DC

## 2016-03-01 NOTE — Addendum Note (Signed)
Addended by: Elio Forget on: 03/01/2016 01:14 PM   Modules accepted: Orders, Medications

## 2016-03-03 ENCOUNTER — Ambulatory Visit (INDEPENDENT_AMBULATORY_CARE_PROVIDER_SITE_OTHER)
Admission: RE | Admit: 2016-03-03 | Discharge: 2016-03-03 | Disposition: A | Discharge status: Home / Self Care | Payer: Commercial Managed Care - HMO | Source: Ambulatory Visit

## 2016-03-03 DIAGNOSIS — M40204 Unspecified kyphosis, thoracic region: Secondary | ICD-10-CM | POA: Diagnosis not present

## 2016-03-03 DIAGNOSIS — Z780 Asymptomatic menopausal state: Secondary | ICD-10-CM

## 2016-03-28 ENCOUNTER — Ambulatory Visit: Payer: Commercial Managed Care - HMO | Admitting: Family Medicine

## 2016-03-31 ENCOUNTER — Ambulatory Visit (INDEPENDENT_AMBULATORY_CARE_PROVIDER_SITE_OTHER): Payer: Commercial Managed Care - HMO | Admitting: Family Medicine

## 2016-03-31 VITALS — BP 120/80 | HR 109 | Temp 97.9°F | Ht 66.0 in | Wt 203.0 lb

## 2016-03-31 DIAGNOSIS — I10 Essential (primary) hypertension: Secondary | ICD-10-CM

## 2016-03-31 DIAGNOSIS — E1165 Type 2 diabetes mellitus with hyperglycemia: Secondary | ICD-10-CM

## 2016-03-31 DIAGNOSIS — E039 Hypothyroidism, unspecified: Secondary | ICD-10-CM

## 2016-03-31 DIAGNOSIS — M8580 Other specified disorders of bone density and structure, unspecified site: Secondary | ICD-10-CM

## 2016-03-31 NOTE — Progress Notes (Signed)
Pre visit review using our clinic review tool, if applicable. No additional management support is needed unless otherwise documented below in the visit note. 

## 2016-03-31 NOTE — Progress Notes (Signed)
Subjective:    Patient ID: Michaela Rhodes, female    DOB: November 04, 1936, 80 y.o.   MRN: [id number]  HPI  follow multiple items   Severe hypertension last visit. We added amlodipine 5 mg daily and she is taking her medications consistently. No dizziness. No headaches. No side effects.   She has started a recent diet plan and has reduced sugars and starches. She has already lost 4 pounds. Type 2 diabetes. Last A1c 8.3%. We increased her metformin to twice daily. Fasting blood sugars usually around 150. No symptoms of polyuria or polydipsia   Recent DEXA scan. Osteopenia. Takes regular calcium and vitamin D. Trying to walk more. No recent falls.   Hypothyroidism. Recent TSH over 6. We increased her levothyroxine. Will need follow-up thyroid studies in 2-3 months.  Past Medical History  Diagnosis Date  . Hypertension   . Hypothyroidism   . Panic attacks     mild  . SVT (supraventricular tachycardia)     in the past  . DM2 (diabetes mellitus, type 2)   . Hyperlipidemia   . Wears glasses    Past Surgical History  Procedure Laterality Date  . Appendectomy    . Cholecystectomy    . Abdominal hysterectomy      BSO as well  . Reconstruction breast w/ latissimus dorsi flap    . Total shoulder arthroplasty  05/27/2012    Procedure: TOTAL SHOULDER ARTHROPLASTY;  Surgeon: Johnny Bridge, MD;  Location: [facility];  Service: Orthopedics;  Laterality: Right;  . Mastectomy  1985    Left  . I&d extremity Left 01/15/2015    Procedure: IRRIGATION AND DEBRIDEMENT EXTREMITY;  Surgeon: Theodoro Kos, DO;  Location: [place];  Service: Plastics;  Laterality: Left;  . Application of a-cell of extremity Left 01/15/2015    Procedure: APPLICATION OF A-CELL OF EXTREMITY;  Surgeon: Theodoro Kos, DO;  Location: [facility];  Service: Plastics;  Laterality: Left;    reports that she has never smoked. She does not have any smokeless tobacco history on file. She reports that she does  not drink alcohol or use illicit drugs. family history includes Heart disease in her mother; Hypertension in her father, mother, and sister; Stroke in her father. Allergies  Allergen Reactions  . Penicillins Anaphylaxis and Hives  . Keflex [Cephalexin]     Rash       Review of Systems  Constitutional: Negative for fatigue.  Eyes: Negative for visual disturbance.  Respiratory: Negative for cough, chest tightness, shortness of breath and wheezing.   Cardiovascular: Negative for chest pain, palpitations and leg swelling.  Gastrointestinal: Negative for abdominal pain.  Endocrine: Negative for polydipsia and polyuria.  Genitourinary: Negative for dysuria.  Neurological: Negative for dizziness, seizures, syncope, weakness, light-headedness and headaches.       Objective:   Physical Exam  Constitutional: She appears well-developed and well-nourished.  Eyes: Pupils are equal, round, and reactive to light.  Neck: Neck supple. No JVD present. No thyromegaly present.  Cardiovascular: Normal rate and regular rhythm.  Exam reveals no gallop.   Pulmonary/Chest: Effort normal and breath sounds normal. No respiratory distress. She has no wheezes. She has no rales.  Musculoskeletal: She exhibits no edema.  Neurological: She is alert.          Assessment & Plan:   #1 hypertension. Greatly improved. Continue current medications   #2 hypothyroidism. Recheck TSH in 3 months. Recent medication adjustment as above   #3  Type  2 diabetes. Recent increase in metformin. Recheck A1c in 3 months   #4 osteopenia. Continue calcium and vitamin D and regular weightbearing exercise such as walking

## 2016-04-02 DIAGNOSIS — M8580 Other specified disorders of bone density and structure, unspecified site: Secondary | ICD-10-CM | POA: Insufficient documentation

## 2016-04-15 ENCOUNTER — Other Ambulatory Visit: Payer: Self-pay | Admitting: Family Medicine

## 2016-04-19 IMAGING — DX DG TIBIA/FIBULA 2V*L*
4 series · 4 of 4 positions shown · non-contrast
Comparison: None.

CLINICAL DATA: Fall up stairs 2 weeks ago, injuring left tib-fib.
Swelling that was drained. Open wound on the mid anterior part of
the left tib-fib. Blisters on the lateral part of the ankle.
Redness.

EXAM:
LEFT TIBIA AND FIBULA - 2 VIEW

[tibia ap (1 of 2)]
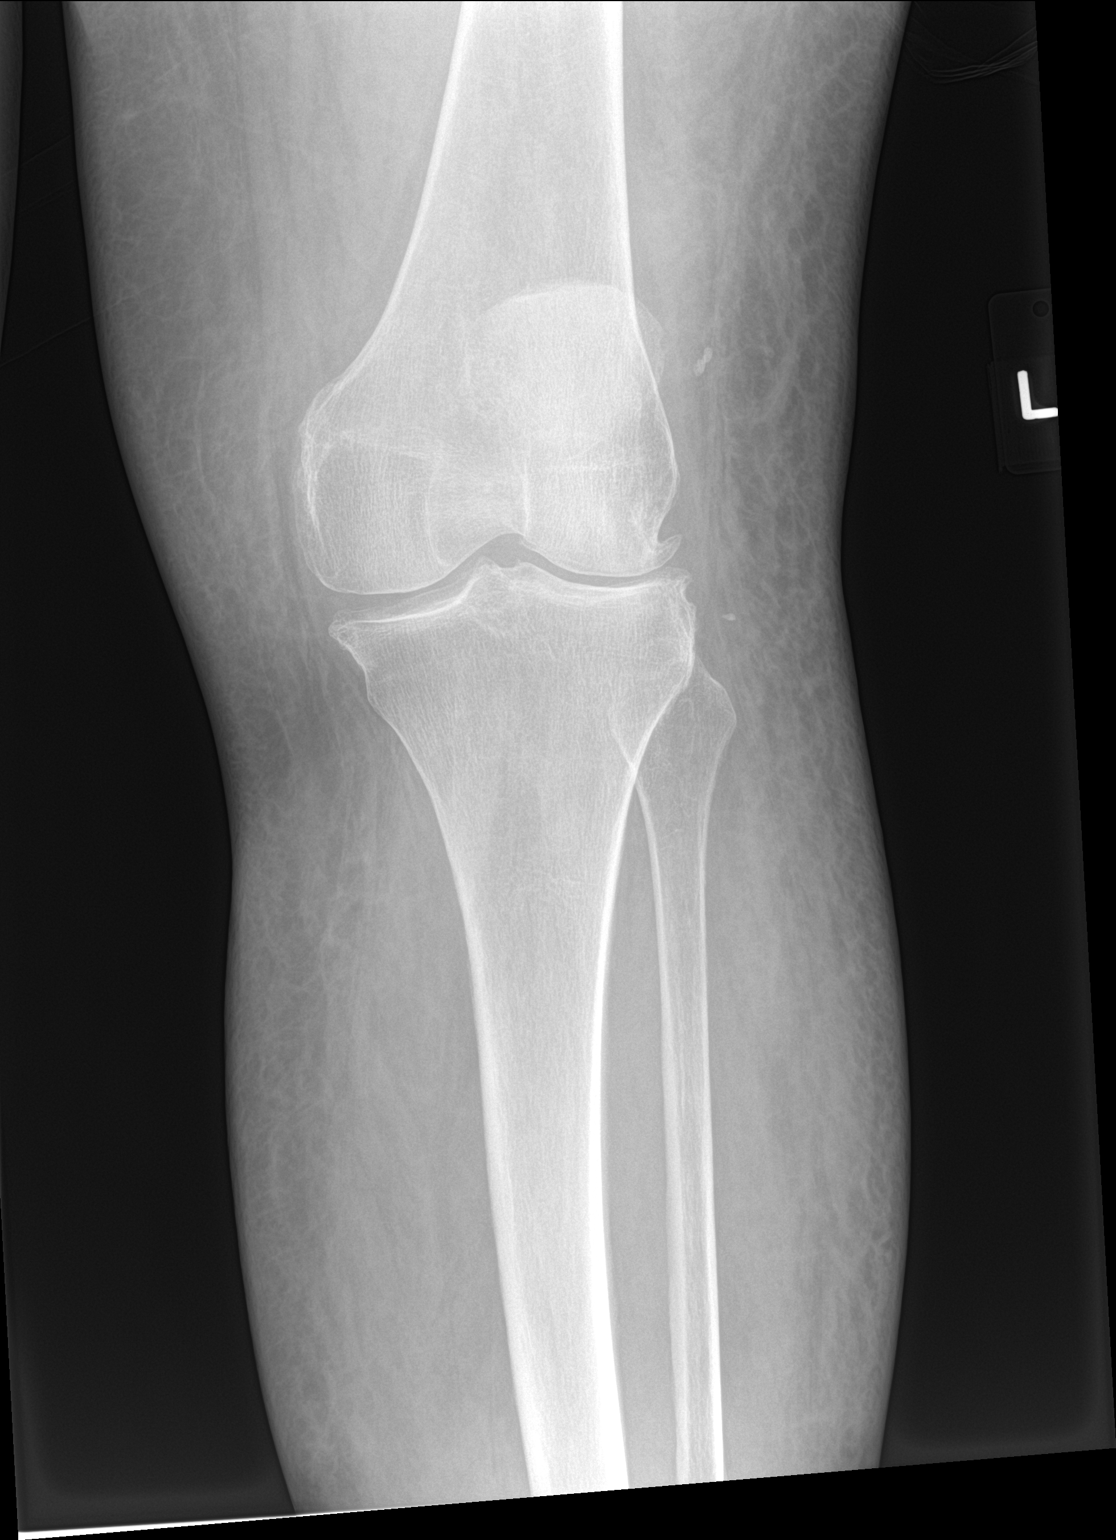

[tibia ap (2 of 2)]
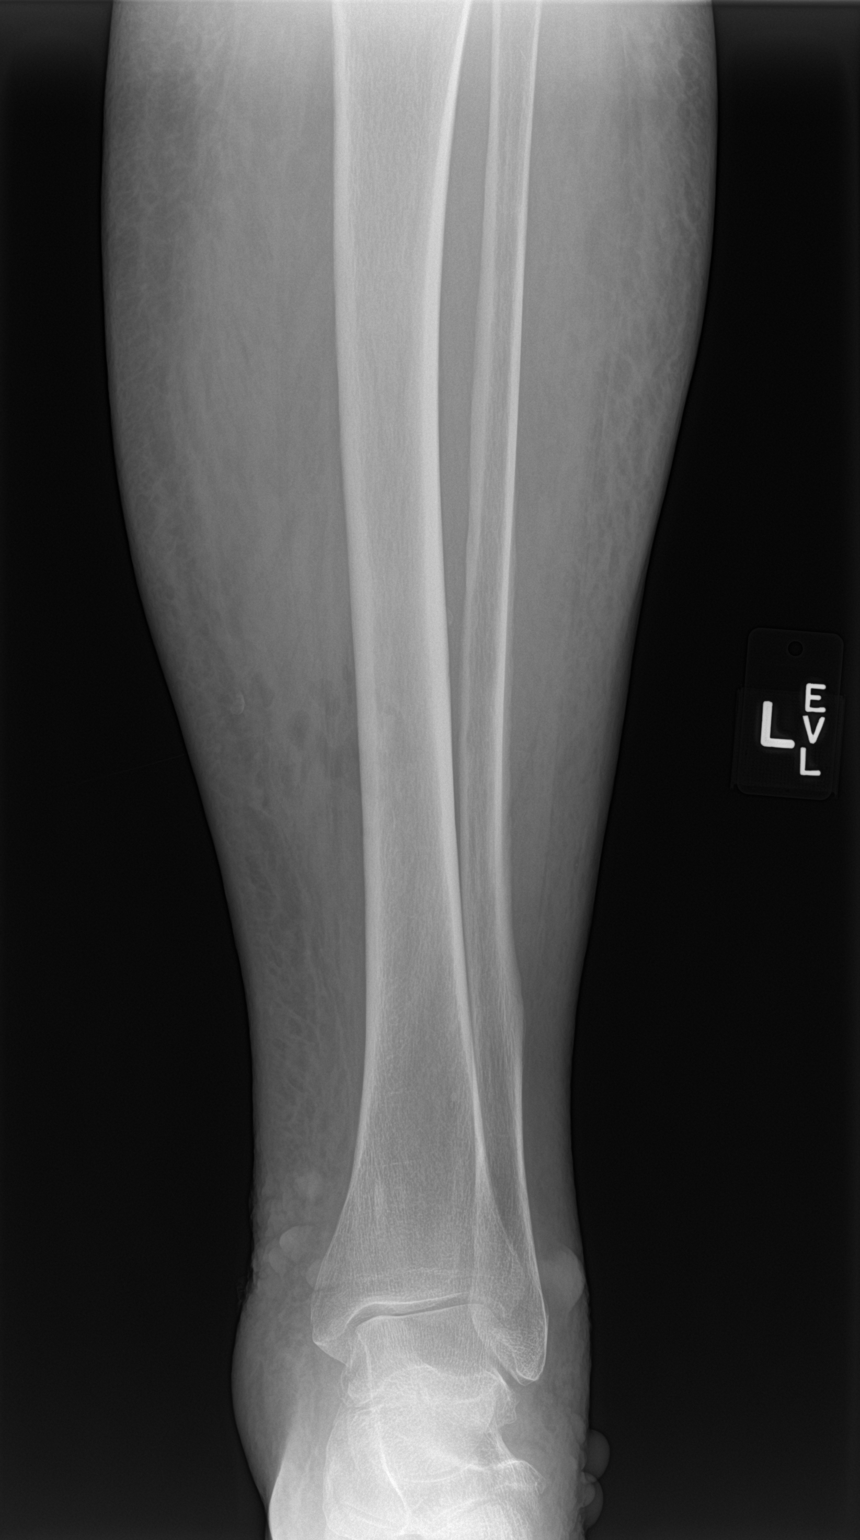

[tibia lat (1 of 2)]
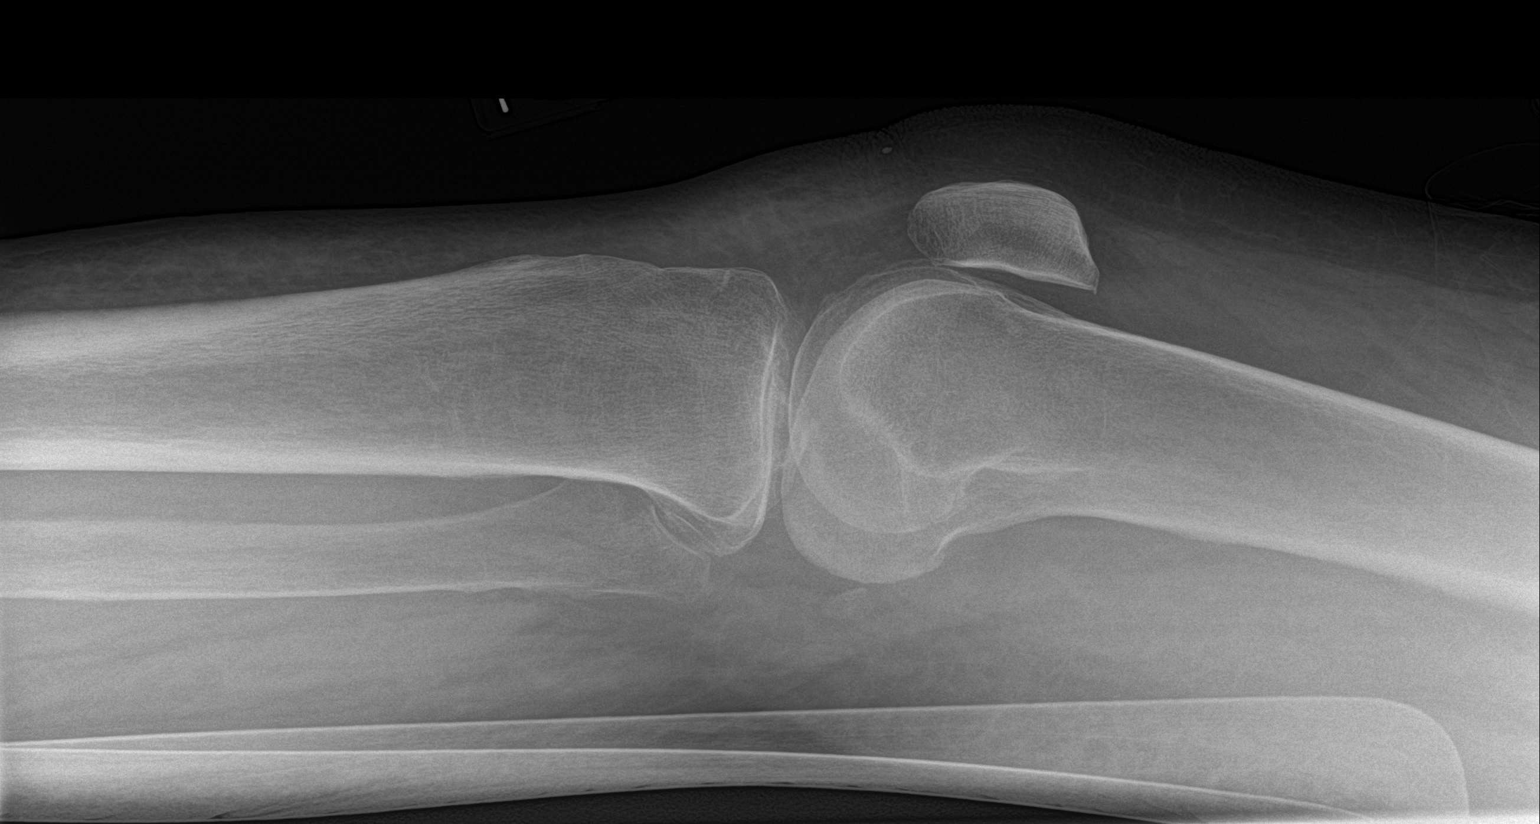

[tibia lat (2 of 2)]
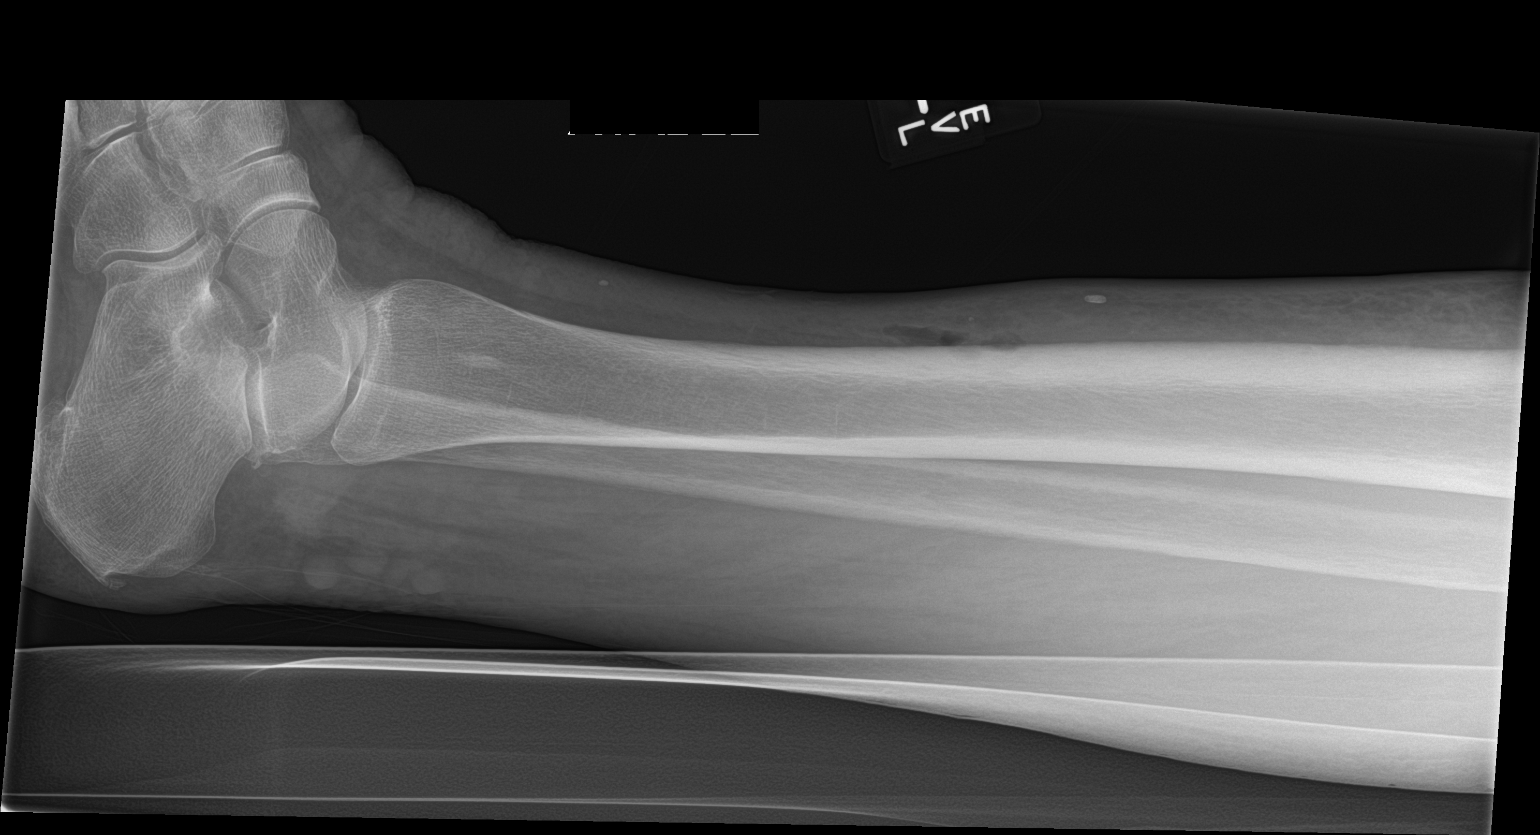

[4 of 4 positions shown; findings below may reference images not displayed]

FINDINGS: Diffusely increased density throughout the soft tissues suggesting
soft tissue edema. Nodular soft tissue prominence in the ankle
region suggesting skin lesions. Correlation with physical
examination is suggested. Focal soft tissue gas collection anterior
to the tibia at the mid/ distal shaft region. This may be sequela of
recent drainage procedure or may indicate soft tissue infection.
Underlying bones appear intact. No evidence of bone destruction or
cortical loss to suggest osteomyelitis. No evidence of acute
fracture or dislocation. Degenerative changes in the left knee.
IMPRESSION: Diffuse soft tissue edema in the left lower leg. Focal gas
collections adjacent to the left anterior medial tibial midshaft may
represent postprocedural change or soft tissue infection. Multiple
skin lesions suggested at the ankles. No acute bony abnormalities.

## 2016-04-23 IMAGING — CR DG ANKLE PORT 2V*L*
3 series · 3 of 3 positions shown · non-contrast
Comparison: None.

CLINICAL DATA: 78-year-old ankle swelling

EXAM:
PORTABLE LEFT ANKLE - 2 VIEW

[AP (1 of 2)]
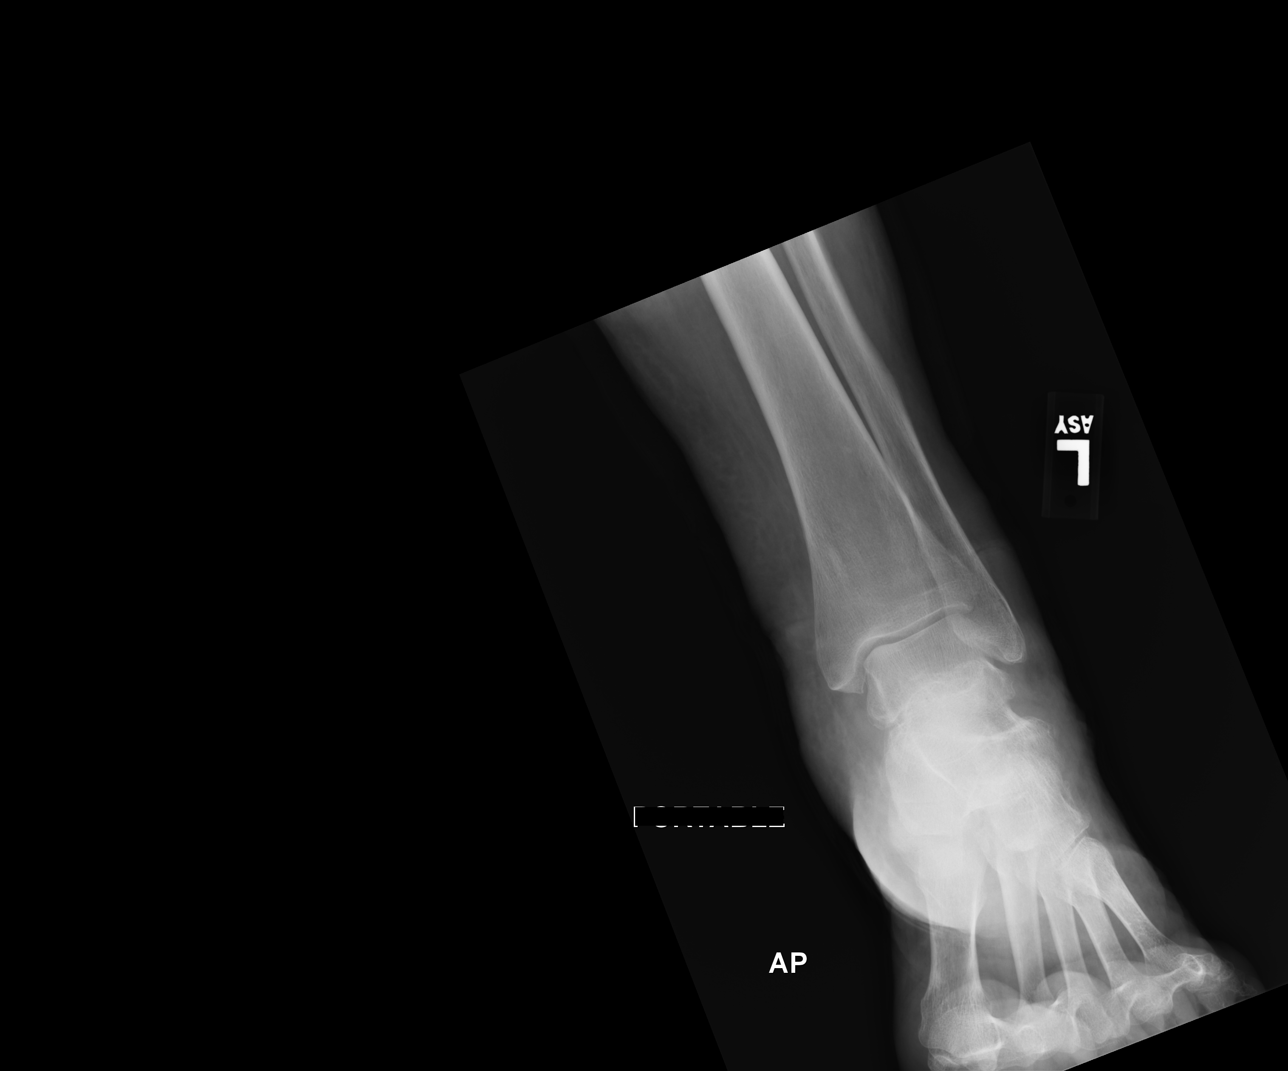

[lateral]
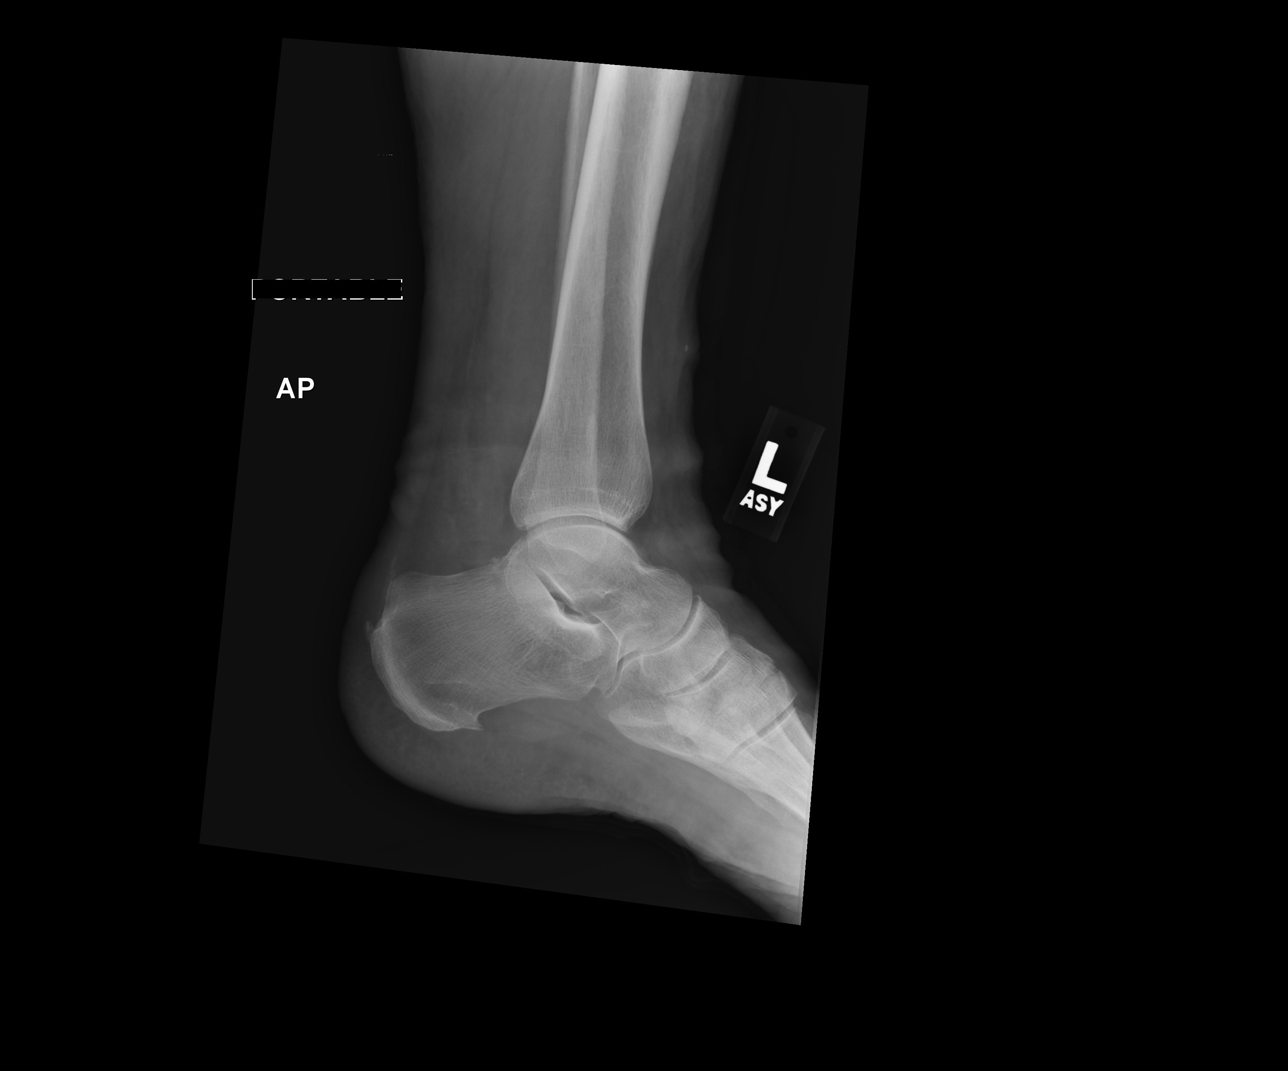

[AP (2 of 2)]
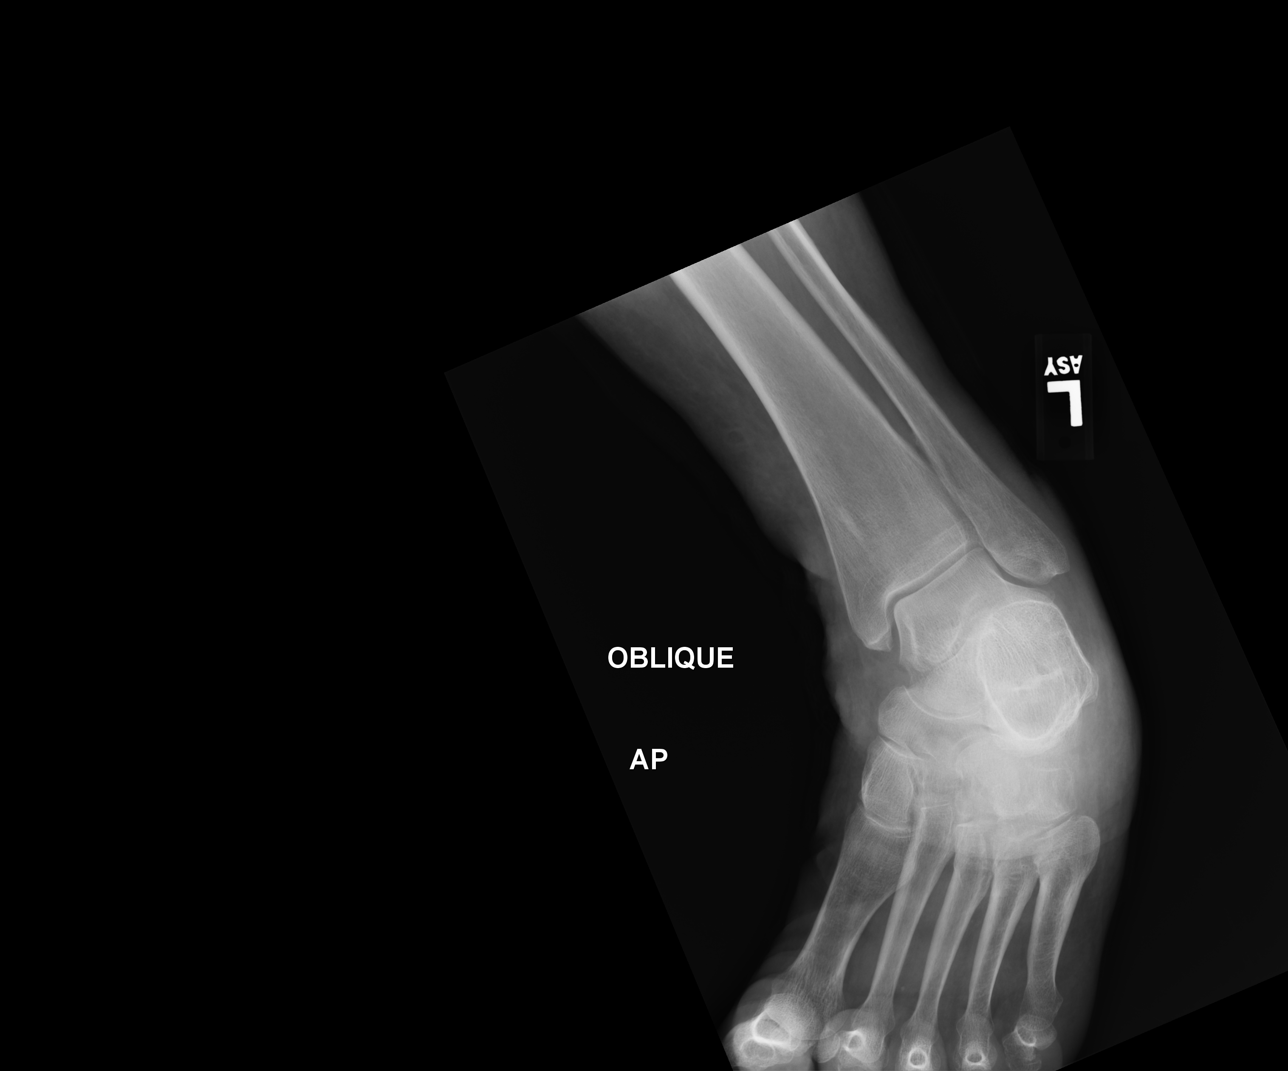

[3 of 3 positions shown; findings below may reference images not displayed]

FINDINGS: There is no evidence of an acute fracture or dislocation. Ankle soft
tissue swelling is present. No suspicious periosteal reaction or
bone lesion is seen. Posterior and plantar calcaneal enthesophytes
are noted. The visualized forefoot is unremarkable. The bone
mineralization is mildly diminished.
IMPRESSION: 1. Ankle soft tissue swelling without evidence of an acute fracture
or dislocation.

2.  Calcaneal spurs.

## 2016-06-09 ENCOUNTER — Other Ambulatory Visit: Payer: Self-pay | Admitting: Family Medicine

## 2016-06-30 ENCOUNTER — Ambulatory Visit: Payer: Commercial Managed Care - HMO | Admitting: Family Medicine

## 2016-11-17 ENCOUNTER — Encounter: Payer: Self-pay | Admitting: Family Medicine

## 2016-11-17 ENCOUNTER — Ambulatory Visit (INDEPENDENT_AMBULATORY_CARE_PROVIDER_SITE_OTHER): Payer: Commercial Managed Care - HMO | Admitting: Family Medicine

## 2016-11-17 VITALS — BP 142/92 | HR 99 | Temp 97.5°F | Wt 212.8 lb

## 2016-11-17 DIAGNOSIS — M545 Low back pain, unspecified: Secondary | ICD-10-CM

## 2016-11-17 MED ORDER — TIZANIDINE HCL 2 MG PO CAPS: 2.0000 mg | ORAL_CAPSULE | Freq: Three times a day (TID) | ORAL | 0 refills | Status: DC

## 2016-11-17 NOTE — Patient Instructions (Addendum)
It was a pleasure to see you today. If symptoms do not improve with medications, worsen, or you develop new symptoms, please follow up with your provider  Back Pain, Adult Introduction Back pain is very common. The pain often gets better over time. The cause of back pain is usually not dangerous. Most people can learn to manage their back pain on their own. Follow these instructions at home: Watch your back pain for any changes. The following actions may help to lessen any pain you are feeling:  Stay active. Start with short walks on flat ground if you can. Try to walk farther each day.  Exercise regularly as told by your doctor. Exercise helps your back heal faster. It also helps avoid future injury by keeping your muscles strong and flexible.  Do not sit, drive, or stand in one place for more than 30 minutes.  Do not stay in bed. Resting more than 1-2 days can slow down your recovery.  Be careful when you bend or lift an object. Use good form when lifting:  Bend at your knees.  Keep the object close to your body.  Do not twist.  Sleep on a firm mattress. Lie on your side, and bend your knees. If you lie on your back, put a pillow under your knees.  Take medicines only as told by your doctor.  Put ice on the injured area.  Put ice in a plastic bag.  Place a towel between your skin and the bag.  Leave the ice on for 20 minutes, 2-3 times a day for the first 2-3 days. After that, you can switch between ice and heat packs.  Avoid feeling anxious or stressed. Find good ways to deal with stress, such as exercise.  Maintain a healthy weight. Extra weight puts stress on your back. Contact a doctor if:  You have pain that does not go away with rest or medicine.  You have worsening pain that goes down into your legs or buttocks.  You have pain that does not get better in one week.  You have pain at night.  You lose weight.  You have a fever or chills. Get help right away  if:  You cannot control when you poop (bowel movement) or pee (urinate).  Your arms or legs feel weak.  Your arms or legs lose feeling (numbness).  You feel sick to your stomach (nauseous) or throw up (vomit).  You have belly (abdominal) pain.  You feel like you may pass out (faint). This information is not intended to replace advice given to you by your health care provider. Make sure you discuss any questions you have with your health care provider. Document Released: 05/23/2008 Document Revised: 05/12/2016 Document Reviewed: 04/08/2014  2017 [employer]vier

## 2016-11-17 NOTE — Progress Notes (Signed)
Subjective:    Patient ID: JAYMA RAMBERT, female    DOB: 11-Dec-1936, 80 y.o.   MRN: [id number]  HPI  Ms. Palazzi is a 80 year old female who presents today with low back pain that started one week ago.  She reports a trigger of walking downstairs, grabbing the handrail and twisting which caused the pain to occur.  Low back pain is rated as a 4 but can go to a 9 at times and described as aching and sharp.  Pain is aggravated by lying or sitting for extended periods of time. She denies fever, chills, sweats, unexplained weight loss, numbness, tingling, weakness, radiation of pain, or loss of control of bowel or bladder. Treatment with ibuprofen up to 4 times/day has provided moderate benefit.    Review of Systems  Constitutional: Negative for chills, fatigue and fever.  Respiratory: Negative for cough, shortness of breath and wheezing.   Cardiovascular: Negative for chest pain and palpitations.  Gastrointestinal: Negative for abdominal pain, constipation, diarrhea, nausea and vomiting.  Genitourinary: Negative for dysuria and frequency.  Musculoskeletal: Positive for back pain.  Neurological: Negative for dizziness, light-headedness, numbness and headaches.   Past Medical History:  Diagnosis Date  . DM2 (diabetes mellitus, type 2) ([place])   . Hyperlipidemia   . Hypertension   . Hypothyroidism   . Panic attacks    mild  . SVT (supraventricular tachycardia) ([facility])    in the past  . Wears glasses      Social History   Social History  . Marital status: Widowed    Spouse name: N/A  . Number of children: N/A  . Years of education: N/A   Occupational History  . retired     had [employer] business   Social History Main Topics  . Smoking status: Never Smoker  . Smokeless tobacco: Not on file  . Alcohol use No  . Drug use: No  . Sexual activity: No   Other Topics Concern  . Not on file   Social History Narrative  . No narrative on file    Past Surgical History:    Procedure Laterality Date  . ABDOMINAL HYSTERECTOMY     BSO as well  . APPENDECTOMY    . APPLICATION OF A-CELL OF EXTREMITY Left 01/15/2015   Procedure: APPLICATION OF A-CELL OF EXTREMITY;  Surgeon: Theodoro Kos, DO;  Location: [facility];  Service: Plastics;  Laterality: Left;  . CHOLECYSTECTOMY    . I&D EXTREMITY Left 01/15/2015   Procedure: IRRIGATION AND DEBRIDEMENT EXTREMITY;  Surgeon: Theodoro Kos, DO;  Location: [facility];  Service: Plastics;  Laterality: Left;  Marland Kitchen MASTECTOMY  1985   Left  . RECONSTRUCTION BREAST W/ LATISSIMUS DORSI FLAP    . TOTAL SHOULDER ARTHROPLASTY  05/27/2012   Procedure: TOTAL SHOULDER ARTHROPLASTY;  Surgeon: Johnny Bridge, MD;  Location: [facility];  Service: Orthopedics;  Laterality: Right;    Family History  Problem Relation Age of Onset  . Coronary artery disease      family hx of  . Diabetes      family hx of  . Hypertension      family hx of  . Heart disease Mother   . Hypertension Mother   . Stroke Father   . Hypertension Father   . Hypertension Sister     Allergies  Allergen Reactions  . Penicillins Anaphylaxis and Hives  . Keflex [Cephalexin]     Rash     Current Outpatient Prescriptions  on File Prior to Visit  Medication Sig Dispense Refill  . amLODipine (NORVASC) 5 MG tablet Take 1 tablet (5 mg total) by mouth daily. 90 tablet 3  . calcium-vitamin D (OSCAL WITH D) 500-200 MG-UNIT per tablet Take 1 tablet by mouth daily. 100 tablet 2  . glimepiride (AMARYL) 2 MG tablet TAKE ONE TABLET BY MOUTH ONCE DAILY WITH  BREAKFAST 90 tablet 2  . levothyroxine (SYNTHROID, LEVOTHROID) 137 MCG tablet Take 1 tablet (137 mcg total) by mouth daily before breakfast. 90 tablet 2  . lisinopril-hydrochlorothiazide (PRINZIDE,ZESTORETIC) 20-25 MG tablet TAKE ONE TABLET BY MOUTH ONCE DAILY 90 tablet 2  . lisinopril-hydrochlorothiazide (PRINZIDE,ZESTORETIC) 20-25 MG tablet TAKE ONE TABLET BY MOUTH ONCE DAILY 90 tablet 1  .  metFORMIN (GLUCOPHAGE XR) 750 MG 24 hr tablet Take 1 tablet (750 mg total) by mouth 2 (two) times daily at 10 AM and 5 PM. 180 tablet 2  . metoprolol (LOPRESSOR) 50 MG tablet TAKE ONE TABLET BY MOUTH TWICE DAILY 180 tablet 2  . pravastatin (PRAVACHOL) 40 MG tablet TAKE ONE TABLET BY MOUTH ONCE DAILY. 90 tablet 2   No current facility-administered medications on file prior to visit.     BP (!) 148/92 (BP Location: Left Arm, Patient Position: Sitting, Cuff Size: Normal)   Pulse (!) 104   Temp 97.5 F (36.4 C) (Oral)   Wt 212 lb 12.8 oz (96.5 kg)   SpO2 97%   BMI 34.35 kg/m       Objective:   Physical Exam  Constitutional: She is oriented to person, place, and time. She appears well-developed and well-nourished.  Eyes: Pupils are equal, round, and reactive to light. No scleral icterus.  Neck: Neck supple.  Cardiovascular: Normal rate and regular rhythm.   Pulmonary/Chest: Effort normal and breath sounds normal. She has no wheezes. She has no rales.  Abdominal: Soft. Bowel sounds are normal. There is no CVA tenderness.  Musculoskeletal:  Spine with normal alignment and no deformity. No tenderness to vertebral process with palpation with the exception of mild tenderness around L5.  Paraspinous muscles are tender and patient notes this area as painful and gripping when changing positions between lying and sitting. ROM is full at lumbar sacral regions. Negative Straight Leg raise. No CVA tenderness present. Able to raise up on heels/toes without pain. Patient was wearing croc shoes and heel toe walking was deferred for safety.    Lymphadenopathy:    She has no cervical adenopathy.  Neurological: She is alert and oriented to person, place, and time. She has normal strength. She displays a negative Romberg sign. Coordination normal.  Reflex Scores:      Brachioradialis reflexes are 2+ on the right side and 2+ on the left side.      Patellar reflexes are 2+ on the right side and 2+ on the  left side. Skin: Skin is warm and dry. No rash noted.       Assessment & Plan:  1. Acute low back pain without sciatica, unspecified back pain laterality Exam unremarkable; Suspect that pain was triggered by twisting motion and muscle strain. Neuro exam normal. If no improvement, advised patient to follow up if symptoms do not improve with Zanaflex, worsening pain, pain at night, or she develops new symptoms. She is not a candidate for ibuprofen. We discussed possible PT for symptoms that do not improve. - tizanidine (ZANAFLEX) 2 MG capsule; Take 1 capsule (2 mg total) by mouth 3 (three) times daily.  Dispense: 30 capsule; Refill:  0  Delano Metz, FNP-C

## 2016-11-17 NOTE — Progress Notes (Signed)
Pre visit review using our clinic review tool, if applicable. No additional management support is needed unless otherwise documented below in the visit note. 

## 2016-11-22 ENCOUNTER — Ambulatory Visit: Payer: Commercial Managed Care - HMO

## 2016-11-22 ENCOUNTER — Other Ambulatory Visit: Payer: Self-pay | Admitting: Emergency Medicine

## 2016-11-22 ENCOUNTER — Telehealth: Payer: Self-pay | Admitting: Family Medicine

## 2016-11-22 DIAGNOSIS — M545 Low back pain: Secondary | ICD-10-CM

## 2016-11-22 NOTE — Telephone Encounter (Signed)
Patient saw Almira Coaster, NP on 11/17/16 for acute visit of back pain. Almyra Free stated in Assessment:  "She is not a candidate for ibuprofen. We discussed possible PT for symptoms that do not improve."  Please advise on if PT orders are approved through you (PCP) or if better to direct to [facility] since she saw the patient.
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
## 2016-11-22 NOTE — Telephone Encounter (Signed)
Pt would like to be referred to a PT she feels that it will help her.

## 2016-11-22 NOTE — Telephone Encounter (Signed)
Called and spoke with pt informing pt that order for has been placed for physical therapy and she should receive a call in the next few day. Pt verbalized understanding nothing further needed at this time.

## 2016-11-22 NOTE — Telephone Encounter (Signed)
OK to set up PT-please evaluate and treat for low back pain.

## 2016-11-29 ENCOUNTER — Ambulatory Visit: Payer: Commercial Managed Care - HMO | Attending: Family Medicine | Admitting: Physical Therapy

## 2016-11-29 ENCOUNTER — Encounter: Payer: Self-pay | Admitting: Physical Therapy

## 2016-11-29 DIAGNOSIS — M5442 Lumbago with sciatica, left side: Secondary | ICD-10-CM | POA: Insufficient documentation

## 2016-11-29 DIAGNOSIS — M5441 Lumbago with sciatica, right side: Secondary | ICD-10-CM | POA: Diagnosis not present

## 2016-11-29 DIAGNOSIS — R293 Abnormal posture: Secondary | ICD-10-CM | POA: Insufficient documentation

## 2016-11-29 DIAGNOSIS — R262 Difficulty in walking, not elsewhere classified: Secondary | ICD-10-CM | POA: Diagnosis not present

## 2016-11-29 NOTE — Therapy (Signed)
[facility] [address] [place], [place], [address] Phone: [phone]   Fax:  [phone]  Physical Therapy Evaluation  Patient Details  Name: Michaela Rhodes MRN: [id number] Date of Birth: [birth year] Referring Provider: Carolann Littler MD  Encounter Date: 11/29/2016      PT End of Session - 11/29/16 1648    Visit Number 1   Number of Visits 10   Date for PT Re-Evaluation 01-25-2017   Authorization Type G-codes at visit 10, KX at visit 15 - medicare   PT Start Time 1533   PT Stop Time 1617   PT Time Calculation (min) 44 min   Activity Tolerance Patient tolerated treatment well   Behavior During Therapy [facility] for tasks assessed/performed      Past Medical History:  Diagnosis Date  . DM2 (diabetes mellitus, type 2) ([place])   . Hyperlipidemia   . Hypertension   . Hypothyroidism   . Panic attacks    mild  . SVT (supraventricular tachycardia) (HCC)    in the past  . Wears glasses     Past Surgical History:  Procedure Laterality Date  . ABDOMINAL HYSTERECTOMY     BSO as well  . APPENDECTOMY    . APPLICATION OF A-CELL OF EXTREMITY Left 01/15/2015   Procedure: APPLICATION OF A-CELL OF EXTREMITY;  Surgeon: Theodoro Kos, DO;  Location: [place];  Service: Plastics;  Laterality: Left;  . CHOLECYSTECTOMY    . I&D EXTREMITY Left 01/15/2015   Procedure: IRRIGATION AND DEBRIDEMENT EXTREMITY;  Surgeon: Theodoro Kos, DO;  Location: [place];  Service: Plastics;  Laterality: Left;  Marland Kitchen MASTECTOMY  1985   Left  . RECONSTRUCTION BREAST W/ LATISSIMUS DORSI FLAP    . TOTAL SHOULDER ARTHROPLASTY  05/27/2012   Procedure: TOTAL SHOULDER ARTHROPLASTY;  Surgeon: Johnny Bridge, MD;  Location: [place];  Service: Orthopedics;  Laterality: Right;    There were no vitals filed for this visit.       Subjective Assessment - 11/29/16 1544    Subjective Pt states her house has a lot of stairs and  she slipped on the last step coming down.  States she did not fall but she hit Lt hip and spun around and hit the Rt.  States she has an artificial shoulder and feels like she may have been guarding her shoulder when she slipped, but she is not really sure what happened.    Pertinent History osteopenia, history of multiple abdominal surgeries   Limitations Standing   How long can you sit comfortably? not limited   How long can you stand comfortably? 15 minutes   How long can you walk comfortably? 15 minutes   Diagnostic tests no   Patient Stated Goals get rid of the pain,    Currently in Pain? Yes   Pain Score 9   average closer to 7/10   Pain Location Hip   Pain Orientation Right;Left   Pain Descriptors / Indicators Aching   Pain Type Acute pain   Pain Radiating Towards down the Lt leg a little bit not past the knee   Pain Onset More than a month ago   Pain Frequency Intermittent   Aggravating Factors  supine to sit, standing, walking, vaccuming, up stairs   Pain Relieving Factors Motrin and Advil, heat, walking holding a cart   Effect of Pain on Daily Activities get up from lying down, worried about falling   Multiple Pain Sites No  [facility] PT Assessment - 11/29/16 0001      Assessment   Medical Diagnosis M54.5   Referring Provider Carolann Littler MD   Onset Date/Surgical Date 11/12/16   Prior Therapy no     Precautions   Precautions Fall     Restrictions   Weight Bearing Restrictions No     Balance Screen   Has the patient fallen in the past 6 months Yes   How many times? 1   Has the patient had a decrease in activity level because of a fear of falling?  Yes   Is the patient reluctant to leave their home because of a fear of falling?  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   Leisure hook rugs, make lampshades     Cognition   Overall Cognitive Status Within Functional Limits for tasks assessed     Observation/Other Assessments   Focus on Therapeutic Outcomes (FOTO)  61% limitation     Posture/Postural Control   Posture/Postural Control Postural limitations   Postural Limitations Rounded Shoulders;Forward head;Decreased lumbar lordosis;Increased thoracic kyphosis;Posterior pelvic tilt;Flexed trunk   Posture Comments height 163.5 cm (64.5 inches)     AROM   Lumbar Flexion 10% limited   Lumbar Extension 80% limited   Lumbar - Right Side Bend WFL   Lumbar - Left Side Bend WFL   Lumbar - Right Rotation 50% limited   Lumbar - Left Rotation 50% limited     Strength   Right/Left Hip Right   Right Hip External Rotation  4-/5   Right Hip ABduction 3/5   Right Hip ADduction 3/5   Left Hip External Rotation 4-/5   Right/Left Knee --  [facility]     Straight Leg Raise   Findings Positive   Side  Left     Transfers   Five time sit to stand comments  22 sec  uses UE to stand     Ambulation/Gait   Ambulation/Gait Yes   Ambulation/Gait Assistance 7: Independent   Ambulation Distance (Feet) 100 Feet   Gait Pattern Decreased stride length;Decreased trunk rotation;Trunk flexed   Ambulation Surface Level     Timed Up and Go Test   Normal TUG (seconds) 13.3  uses UE to stand                           PT Education - 11/29/16 1648    Education provided Yes   Education Details educated on log rolling   Person(s) Educated Patient   Methods Explanation   Comprehension Verbalized understanding          PT Short Term Goals - 11/29/16 1653      PT SHORT TERM GOAL #1   Title independent with initial HEP   Time 4   Period Weeks   Status New     PT SHORT TERM GOAL #2   Title able to sleep in her bed due to being able to get in and out of bed with proper mechanics   Time 4    Period Weeks   Status New     PT SHORT TERM GOAL #3   Title able to walk up stairs to second  floor in her home with 50% less pain   Time 4   Period Weeks   Status New           PT Long Term Goals - 11/29/16 1655      PT LONG TERM GOAL #1   Title independent with advanced HEP   Time 8   Period Weeks   Status New     PT LONG TERM GOAL #2   Title FOTO < or = 41% limitation   Baseline 61% limitation   Time 8   Period Weeks   Status New     PT LONG TERM GOAL #3   Title able to go up and down stairs and get in and out of bed with 75% less pain   Baseline 9/10 pain   Time 8   Period Weeks   Status New     PT LONG TERM GOAL #4   Title TUG <12 sec for reduced risk of falls   Baseline 13.3 sec   Time 8   Period Weeks   Status New     PT LONG TERM GOAL #5   Title 5 times sit to stand <13 sec in order to be within norms so she will have reduced risk of falls   Baseline 22 sec   Time 8   Period Weeks   Status New     Additional Long Term Goals   Additional Long Term Goals Yes     PT LONG TERM GOAL #6   Title Pt will report feeling 50% less likely to avoid activity due to fear of pain or falling than prior to starting PT.   Time 8   Period Weeks   Status New               Plan - 11/29/16 1700    Clinical Impression Statement Pt presents to clinic for moderate complexity eval due to needing to address posture, hips, and low back and has multiple comorbidities including fear avoidance, hip pain, increased risk of falls, osteopenia, and multiple abdominal surgeries effecting core stability, and pt has evolving clinical presentation.  Pt has dedcreased hip strength 3/5 to 4/5 and limited ROM up to 80% limited lumbar extension.  Pt has increased risk of falls as demonstrated by increased si to stand time 22sec and TUG time of 13.3 sec.  Pt has pain of 9/10 and difficulty with functional mobility and transfers.  Pt will benefit from skilled PT to address impairments,  improved posture and balance and manage pain.   Rehab Potential Good   Clinical Impairments Affecting Rehab Potential multiple abdominal surgeries, osteopenia   PT Frequency 2x / week   PT Duration 8 weeks   PT Treatment/Interventions ADLs/Self Care Home Management;Cryotherapy;Electrical Stimulation;Moist Heat;Gait training;Stair training;Functional mobility training;Therapeutic activities;Therapeutic exercise;Balance training;Neuromuscular re-education;Patient/family education;Manual techniques;Passive range of motion;Dry needling;Taping   PT Next Visit Plan manual to lumbar and gluteal for reduced muslce spasms, abdominal bracing during functional movements supine<>sit and sit to stand, LE and core strength   PT Home Exercise Plan progress as tolerated   Recommended Other Services none   Consulted and Agree with Plan of Care Patient      Patient will benefit from skilled therapeutic intervention in order to improve the following deficits and impairments:  Abnormal gait, Decreased activity tolerance, Decreased balance, Decreased endurance, Decreased range of motion, Difficulty walking, Decreased strength, Increased muscle spasms, Impaired perceived functional ability, Impaired flexibility, Pain, Postural dysfunction  Visit Diagnosis: Acute bilateral low  back pain with bilateral sciatica - Plan: PT plan of care cert/re-cert  Abnormal posture - Plan: PT plan of care cert/re-cert  Difficulty in walking, not elsewhere classified - Plan: PT plan of care cert/re-cert      G-Codes - [id number]    Functional Assessment Tool Used FOTO, TUG, 5 times sit to stand, clinical impressions   Functional Limitation Other PT subsequent   Other PT Secondary Current Status [id number]) At least 60 percent but less than 80 percent impaired, limited or restricted   Other PT Secondary Goal Status [id number]) At least 40 percent but less than 60 percent impaired, limited or restricted       Problem List Patient  Active Problem List   Diagnosis Date Noted  . Osteopenia 04/02/2016  . Diabetes type 2, uncontrolled ([place])   . Other specified hypothyroidism   . Acute renal failure syndrome ([place])   . Cellulitis 12/27/2014  . Cellulitis of left lower extremity 12/26/2014  . Obesity (BMI 30-39.9) 08/30/2013  . Hyperlipidemia 09/25/2012  . Breast cancer, left ([place]) 07/26/2012  . Syncope 06/26/2012  . Type 2 diabetes mellitus, uncontrolled ([place]) 05/30/2012  . Acute renal failure ([place]) 05/30/2012  . HTN (hypertension) 05/30/2012  . Postoperative anemia due to acute blood loss 05/29/2012  . Tachycardia 05/27/2012  . Hyperglycemia 05/27/2012  . Closed fracture of right proximal humerus 05/26/2012  . OVERWEIGHT 10/19/2009  . Hypothyroidism 10/16/2009  . Essential hypertension 10/16/2009  . SVT (supraventricular tachycardia) ([place]) 10/16/2009    Zannie [place], PT 11/29/2016, 5:17 PM  [facility] [address] [place], [place], [address] Phone: [phone]   Fax:  [phone]  Name: AUDEN MEUSER MRN: [id number] Date of Birth: December 02, 1936

## 2016-11-30 DIAGNOSIS — E119 Type 2 diabetes mellitus without complications: Secondary | ICD-10-CM | POA: Diagnosis not present

## 2016-11-30 DIAGNOSIS — H2513 Age-related nuclear cataract, bilateral: Secondary | ICD-10-CM | POA: Diagnosis not present

## 2016-11-30 LAB — HM DIABETES EYE EXAM

## 2016-12-01 ENCOUNTER — Encounter: Payer: Commercial Managed Care - HMO | Admitting: Physical Therapy

## 2016-12-06 ENCOUNTER — Encounter: Payer: Self-pay | Admitting: Family Medicine

## 2016-12-06 ENCOUNTER — Ambulatory Visit: Payer: Commercial Managed Care - HMO | Admitting: Physical Therapy

## 2016-12-06 ENCOUNTER — Encounter: Payer: Self-pay | Admitting: Physical Therapy

## 2016-12-06 DIAGNOSIS — R293 Abnormal posture: Secondary | ICD-10-CM | POA: Diagnosis not present

## 2016-12-06 DIAGNOSIS — M5441 Lumbago with sciatica, right side: Secondary | ICD-10-CM

## 2016-12-06 DIAGNOSIS — M5442 Lumbago with sciatica, left side: Principal | ICD-10-CM

## 2016-12-06 DIAGNOSIS — R262 Difficulty in walking, not elsewhere classified: Secondary | ICD-10-CM

## 2016-12-06 NOTE — Therapy (Signed)
[facility] [address] [place], [place], [address] Phone: [phone]   Fax:  [phone]  Physical Therapy Treatment  Patient Details  Name: Michaela Rhodes MRN: [id number] Date of Birth: 1936/02/26 Referring Provider: Carolann Littler MD  Encounter Date: 12/06/2016      PT End of Session - 12/06/16 1224    Visit Number 2   Number of Visits 10   Date for PT Re-Evaluation 02-01-2017   Authorization Type G-codes at visit 10, KX at visit 15 - medicare   PT Start Time 1218   PT Stop Time 1313   PT Time Calculation (min) 55 min   Activity Tolerance Patient tolerated treatment well   Behavior During Therapy [facility] for tasks assessed/performed      Past Medical History:  Diagnosis Date  . DM2 (diabetes mellitus, type 2) ([place])   . Hyperlipidemia   . Hypertension   . Hypothyroidism   . Panic attacks    mild  . SVT (supraventricular tachycardia) ([facility])    in the past  . Wears glasses     Past Surgical History:  Procedure Laterality Date  . ABDOMINAL HYSTERECTOMY     BSO as well  . APPENDECTOMY    . APPLICATION OF A-CELL OF EXTREMITY Left 01/15/2015   Procedure: APPLICATION OF A-CELL OF EXTREMITY;  Surgeon: Theodoro Kos, DO;  Location: [place];  Service: Plastics;  Laterality: Left;  . CHOLECYSTECTOMY    . I&D EXTREMITY Left 01/15/2015   Procedure: IRRIGATION AND DEBRIDEMENT EXTREMITY;  Surgeon: Theodoro Kos, DO;  Location: [place];  Service: Plastics;  Laterality: Left;  Marland Kitchen MASTECTOMY  1985   Left  . RECONSTRUCTION BREAST W/ LATISSIMUS DORSI FLAP    . TOTAL SHOULDER ARTHROPLASTY  05/27/2012   Procedure: TOTAL SHOULDER ARTHROPLASTY;  Surgeon: Johnny Bridge, MD;  Location: [place];  Service: Orthopedics;  Laterality: Right;    There were no vitals filed for this visit.      Subjective Assessment - 12/06/16 1221    Subjective Pt reports hips feel ok but knows that she leans  forward to keep them from hurting.    Pertinent History osteopenia, history of multiple abdominal surgeries   Limitations Standing   How long can you sit comfortably? not limited   How long can you stand comfortably? 15 minutes   How long can you walk comfortably? 15 minutes   Diagnostic tests no   Patient Stated Goals get rid of the pain,    Currently in Pain? Yes   Pain Score 7    Pain Location Hip   Pain Orientation Right;Left   Pain Descriptors / Indicators Aching   Pain Type Acute pain   Pain Radiating Towards Down the Lt leg a little bit but not past the knee   Pain Onset More than a month ago   Pain Frequency Intermittent   Aggravating Factors  supine to sit, standing, walking, vaccuming, stairs   Multiple Pain Sites No                         [facility] Adult PT Treatment/Exercise - 12/06/16 0001      Exercises   Exercises Knee/Hip     Knee/Hip Exercises: Aerobic   Nustep L1 x 5 minutes  Therapist present to disuss treatment     Knee/Hip Exercises: Standing   Other Standing Knee Exercises Standing reaching arms [place]     Knee/Hip  Exercises: Seated   Ball Squeeze x20 3 second holds   Hamstring Curl Strengthening;Both;2 sets;10 reps     Modalities   Modalities Electrical Stimulation;Moist Heat     Moist Heat Therapy   Number Minutes Moist Heat 15 Minutes   Moist Heat Location Lumbar Spine     Electrical Stimulation   Electrical Stimulation Location Bil hips   Electrical Stimulation Action IFC   Electrical Stimulation Parameters To tolerance   Electrical Stimulation Goals Pain                  PT Short Term Goals - 12/06/16 1225      PT SHORT TERM GOAL #1   Title independent with initial HEP   Time 4   Period Weeks   Status On-going     PT SHORT TERM GOAL #2   Title able to sleep in her bed due to being able to get in and out of bed with proper mechanics   Time 4   Period Weeks   Status On-going     PT SHORT TERM GOAL #3    Title able to walk up stairs to second floor in her home with 50% less pain   Time 4   Period Weeks   Status On-going           PT Long Term Goals - 12/06/16 1233      PT LONG TERM GOAL #1   Title independent with advanced HEP   Time 8   Period Weeks   Status On-going     PT LONG TERM GOAL #2   Title FOTO < or = 41% limitation   Baseline 61% limitation   Time 8   Period Weeks   Status On-going     PT LONG TERM GOAL #3   Title able to go up and down stairs and get in and out of bed with 75% less pain   Baseline 9/10 pain   Time 8   Period Weeks   Status On-going     PT LONG TERM GOAL #4   Title TUG <12 sec for reduced risk of falls   Baseline 13.3 sec   Time 8   Period Weeks   Status On-going     PT LONG TERM GOAL #5   Title 5 times sit to stand <13 sec in order to be within norms so she will have reduced risk of falls   Baseline 22 sec   Time 8   Period Weeks   Status On-going     PT LONG TERM GOAL #6   Title Pt will report feeling 50% less likely to avoid activity due to fear of pain or falling than prior to starting PT.   Time 8   Period Weeks   Status On-going               Plan - 12/06/16 1303    Clinical Impression Statement Pt presents with forward flexed posture and and Bil hip pain. Pt reports she has not slept laying down since before Thanksgiving due to increased pain upon standing up after prolonged laying. Discuess log roll technique for transfers supine to sit. Pt has very rounded shoulders with Rt shoulder limited ROM and Rt scapular winging. Pt able to tolerate all exercises well and instructed in gentle extension stretching. Pt will continue to benefit form skilled therapy for postural strengthening and core and LE strength and stability.    Rehab Potential Good   Clinical Impairments  Affecting Rehab Potential multiple abdominal surgeries, osteopenia   PT Frequency 2x / week   PT Duration 8 weeks   PT Treatment/Interventions ADLs/Self  Care Home Management;Cryotherapy;Electrical Stimulation;Moist Heat;Gait training;Stair training;Functional mobility training;Therapeutic activities;Therapeutic exercise;Balance training;Neuromuscular re-education;Patient/family education;Manual techniques;Passive range of motion;Dry needling;Taping   PT Next Visit Plan Extensor strengthening, trying supine exercises on high/ low table, LE strength   Consulted and Agree with Plan of Care Patient      Patient will benefit from skilled therapeutic intervention in order to improve the following deficits and impairments:  Abnormal gait, Decreased activity tolerance, Decreased balance, Decreased endurance, Decreased range of motion, Difficulty walking, Decreased strength, Increased muscle spasms, Impaired perceived functional ability, Impaired flexibility, Pain, Postural dysfunction  Visit Diagnosis: Acute bilateral low back pain with bilateral sciatica  Abnormal posture  Difficulty in walking, not elsewhere classified     Problem List Patient Active Problem List   Diagnosis Date Noted  . Osteopenia 04/02/2016  . Diabetes type 2, uncontrolled ([place])   . Other specified hypothyroidism   . Acute renal failure syndrome ([place])   . Cellulitis 12/27/2014  . Cellulitis of left lower extremity 12/26/2014  . Obesity (BMI 30-39.9) 08/30/2013  . Hyperlipidemia 09/25/2012  . Breast cancer, left ([place]) 07/26/2012  . Syncope 06/26/2012  . Type 2 diabetes mellitus, uncontrolled ([place]) 05/30/2012  . Acute renal failure ([place]) 05/30/2012  . HTN (hypertension) 05/30/2012  . Postoperative anemia due to acute blood loss 05/29/2012  . Tachycardia 05/27/2012  . Hyperglycemia 05/27/2012  . Closed fracture of right proximal humerus 05/26/2012  . OVERWEIGHT 10/19/2009  . Hypothyroidism 10/16/2009  . Essential hypertension 10/16/2009  . SVT (supraventricular tachycardia) ([place]) 10/16/2009    Mikle Bosworth PTA 12/06/2016, 1:21 PM  [facility] [address], [place], [place], [address] Phone: [phone]   Fax:  [phone]  Name: Michaela Rhodes MRN: [id number] Date of Birth: [birth year]

## 2016-12-08 ENCOUNTER — Ambulatory Visit: Payer: Commercial Managed Care - HMO | Admitting: Physical Therapy

## 2016-12-08 ENCOUNTER — Encounter: Payer: Self-pay | Admitting: Physical Therapy

## 2016-12-08 DIAGNOSIS — R262 Difficulty in walking, not elsewhere classified: Secondary | ICD-10-CM

## 2016-12-08 DIAGNOSIS — R293 Abnormal posture: Secondary | ICD-10-CM

## 2016-12-08 DIAGNOSIS — M5441 Lumbago with sciatica, right side: Secondary | ICD-10-CM

## 2016-12-08 DIAGNOSIS — M5442 Lumbago with sciatica, left side: Principal | ICD-10-CM

## 2016-12-08 NOTE — Therapy (Signed)
[facility] [address] [place], [place], [address] Phone: [phone]   Fax:  [phone]  Physical Therapy Treatment  Patient Details  Name: Michaela Rhodes MRN: [id number] Date of Birth: 1936-03-03 Referring Provider: Carolann Littler MD  Encounter Date: 12/08/2016      PT End of Session - 12/08/16 1239    Visit Number 3   Number of Visits 10   Date for PT Re-Evaluation 02-03-17   Authorization Type G-codes at visit 10, KX at visit 15 - medicare   PT Start Time 1228   PT Stop Time 1318   PT Time Calculation (min) 50 min   Activity Tolerance Patient tolerated treatment well   Behavior During Therapy [facility] for tasks assessed/performed      Past Medical History:  Diagnosis Date  . DM2 (diabetes mellitus, type 2) ([place])   . Hyperlipidemia   . Hypertension   . Hypothyroidism   . Panic attacks    mild  . SVT (supraventricular tachycardia) (HCC)    in the past  . Wears glasses     Past Surgical History:  Procedure Laterality Date  . ABDOMINAL HYSTERECTOMY     BSO as well  . APPENDECTOMY    . APPLICATION OF A-CELL OF EXTREMITY Left 01/15/2015   Procedure: APPLICATION OF A-CELL OF EXTREMITY;  Surgeon: Theodoro Kos, DO;  Location: [place];  Service: Plastics;  Laterality: Left;  . CHOLECYSTECTOMY    . I&D EXTREMITY Left 01/15/2015   Procedure: IRRIGATION AND DEBRIDEMENT EXTREMITY;  Surgeon: Theodoro Kos, DO;  Location: [place];  Service: Plastics;  Laterality: Left;  Marland Kitchen MASTECTOMY  1985   Left  . RECONSTRUCTION BREAST W/ LATISSIMUS DORSI FLAP    . TOTAL SHOULDER ARTHROPLASTY  05/27/2012   Procedure: TOTAL SHOULDER ARTHROPLASTY;  Surgeon: Johnny Bridge, MD;  Location: [place];  Service: Orthopedics;  Laterality: Right;    There were no vitals filed for this visit.      Subjective Assessment - 12/08/16 1231    Subjective Pt reports not feeling as good today as she did  yesterday. Stood and wrapped presents fr a while last night which could have aggrivated.    Pertinent History osteopenia, history of multiple abdominal surgeries   Limitations Standing   How long can you sit comfortably? not limited   How long can you stand comfortably? 15 minutes   How long can you walk comfortably? 15 minutes   Diagnostic tests no   Patient Stated Goals get rid of the pain,    Currently in Pain? Yes   Pain Score 8    Pain Location Hip   Pain Orientation Left;Right   Pain Descriptors / Indicators Aching   Pain Type Acute pain   Pain Radiating Towards Down Lt leg   Pain Onset More than a month ago   Pain Frequency Intermittent                         [facility] Adult PT Treatment/Exercise - 12/08/16 0001      Lumbar Exercises: Seated   Other Seated Lumbar Exercises Seated Row, shoulder extension     Lumbar Exercises: Supine   Bent Knee Raise 20 reps   Bridge 10 reps   Other Supine Lumbar Exercises Ball squeeze     Knee/Hip Exercises: Stretches   Other Knee/Hip Stretches Seated extension  over pillow     Knee/Hip Exercises: Aerobic  Nustep L1 x 8 minutes  Therapist present to disuss treatment     Knee/Hip Exercises: Standing   Hip Abduction Stengthening;Both;2 sets;10 reps   Hip Extension Stengthening;Both;2 sets;10 reps   Other Standing Knee Exercises Standing reaching arms OH     Knee/Hip Exercises: Seated   Ball Squeeze --     Manual Therapy   Manual Therapy Soft tissue mobilization   Manual therapy comments Pt sidelying  LT and Rt   Soft tissue mobilization IASTM Bil hips                 PT Education - 12/08/16 1318    Education provided Yes   Education Details supine hip exercsies   Person(s) Educated Patient   Methods Explanation;Demonstration;Handout   Comprehension Verbalized understanding          PT Short Term Goals - 12/06/16 1225      PT SHORT TERM GOAL #1   Title independent with initial HEP   Time 4    Period Weeks   Status On-going     PT SHORT TERM GOAL #2   Title able to sleep in her bed due to being able to get in and out of bed with proper mechanics   Time 4   Period Weeks   Status On-going     PT SHORT TERM GOAL #3   Title able to walk up stairs to second floor in her home with 50% less pain   Time 4   Period Weeks   Status On-going           PT Long Term Goals - 12/06/16 1233      PT LONG TERM GOAL #1   Title independent with advanced HEP   Time 8   Period Weeks   Status On-going     PT LONG TERM GOAL #2   Title FOTO < or = 41% limitation   Baseline 61% limitation   Time 8   Period Weeks   Status On-going     PT LONG TERM GOAL #3   Title able to go up and down stairs and get in and out of bed with 75% less pain   Baseline 9/10 pain   Time 8   Period Weeks   Status On-going     PT LONG TERM GOAL #4   Title TUG <12 sec for reduced risk of falls   Baseline 13.3 sec   Time 8   Period Weeks   Status On-going     PT LONG TERM GOAL #5   Title 5 times sit to stand <13 sec in order to be within norms so she will have reduced risk of falls   Baseline 22 sec   Time 8   Period Weeks   Status On-going     PT LONG TERM GOAL #6   Title Pt will report feeling 50% less likely to avoid activity due to fear of pain or falling than prior to starting PT.   Time 8   Period Weeks   Status On-going               Plan - 12/08/16 1319    Clinical Impression Statement Pt presents with forward flexed posture. Has been trying to do back extension stretching at home. Pt has very limited Rt shoulder ROM due to previous injuries and surgeries which may be contributing to pt poor posture. Pt able to tolerate supine exercises well. Declined modalities today needing to leave early. Pt  will continue to benefit from skilled therapy for posture strengthening.    Rehab Potential Good   Clinical Impairments Affecting Rehab Potential multiple abdominal surgeries,  osteopenia   PT Frequency 2x / week   PT Duration 8 weeks   PT Treatment/Interventions ADLs/Self Care Home Management;Cryotherapy;Electrical Stimulation;Moist Heat;Gait training;Stair training;Functional mobility training;Therapeutic activities;Therapeutic exercise;Balance training;Neuromuscular re-education;Patient/family education;Manual techniques;Passive range of motion;Dry needling;Taping   PT Next Visit Plan Supine exercsies (limit supine time), sit to stands from high table   Consulted and Agree with Plan of Care Patient      Patient will benefit from skilled therapeutic intervention in order to improve the following deficits and impairments:  Abnormal gait, Decreased activity tolerance, Decreased balance, Decreased endurance, Decreased range of motion, Difficulty walking, Decreased strength, Increased muscle spasms, Impaired perceived functional ability, Impaired flexibility, Pain, Postural dysfunction  Visit Diagnosis: Acute bilateral low back pain with bilateral sciatica  Abnormal posture  Difficulty in walking, not elsewhere classified     Problem List Patient Active Problem List   Diagnosis Date Noted  . Osteopenia 04/02/2016  . Diabetes type 2, uncontrolled ([place])   . Other specified hypothyroidism   . Acute renal failure syndrome ([place])   . Cellulitis 12/27/2014  . Cellulitis of left lower extremity 12/26/2014  . Obesity (BMI 30-39.9) 08/30/2013  . Hyperlipidemia 09/25/2012  . Breast cancer, left ([place]) 07/26/2012  . Syncope 06/26/2012  . Type 2 diabetes mellitus, uncontrolled ([place]) 05/30/2012  . Acute renal failure ([place]) 05/30/2012  . HTN (hypertension) 05/30/2012  . Postoperative anemia due to acute blood loss 05/29/2012  . Tachycardia 05/27/2012  . Hyperglycemia 05/27/2012  . Closed fracture of right proximal humerus 05/26/2012  . OVERWEIGHT 10/19/2009  . Hypothyroidism 10/16/2009  . Essential hypertension 10/16/2009  . SVT (supraventricular tachycardia)  ([place]) 10/16/2009    Mikle Bosworth PTA 12/08/2016, 1:25 PM  [facility] [address] [place], [place], [address] Phone: [phone]   Fax:  [phone]  Name: Michaela Rhodes MRN: [id number] Date of Birth: August 31, 1936

## 2016-12-08 NOTE — Patient Instructions (Signed)
Bridge    Lie back, legs bent. Inhale, pressing hips up. Keeping ribs in, lengthen lower back. Exhale, rolling down along spine from top. Repeat _10___ times. Do __1__ sessions per day.  http://pm.exer.us/55   Copyright  VHI. All rights reserved.   Abdominals: Single Leg Bend    Lying on back with legs out straight, inhale, then exhale while slowly sliding heel along floor toward buttocks. Slowly return to starting position. Repeat __10__ times each leg per set. Do __2__ sets per session. Do __1__ sessions per day.  Copyright  VHI. All rights reserved.   Mikle Bosworth, PTA 12/08/16 1:09 PM  [facility] [address], [place], [place] [address] Phone # [phone] Fax [phone]

## 2016-12-14 ENCOUNTER — Encounter: Payer: Self-pay | Admitting: Physical Therapy

## 2016-12-14 ENCOUNTER — Ambulatory Visit: Payer: Commercial Managed Care - HMO | Admitting: Physical Therapy

## 2016-12-14 DIAGNOSIS — M5442 Lumbago with sciatica, left side: Secondary | ICD-10-CM | POA: Diagnosis not present

## 2016-12-14 DIAGNOSIS — M5441 Lumbago with sciatica, right side: Secondary | ICD-10-CM

## 2016-12-14 DIAGNOSIS — R262 Difficulty in walking, not elsewhere classified: Secondary | ICD-10-CM

## 2016-12-14 DIAGNOSIS — R293 Abnormal posture: Secondary | ICD-10-CM

## 2016-12-14 NOTE — Therapy (Signed)
[facility] [address] [place], [place], [address] Phone: [phone]   Fax:  [phone]  Physical Therapy Treatment  Patient Details  Name: Michaela Rhodes MRN: [id number] Date of Birth: [birth year] Referring Provider: Carolann Littler MD  Encounter Date: 12/14/2016      PT End of Session - 12/14/16 1235    Visit Number 4   Number of Visits 10   Date for PT Re-Evaluation 2017/01/27   Authorization Type G-codes at visit 10, KX at visit 15 - medicare   PT Start Time 1232   PT Stop Time 1312   PT Time Calculation (min) 40 min   Activity Tolerance Patient tolerated treatment well   Behavior During Therapy [facility] for tasks assessed/performed      Past Medical History:  Diagnosis Date  . DM2 (diabetes mellitus, type 2) ([place])   . Hyperlipidemia   . Hypertension   . Hypothyroidism   . Panic attacks    mild  . SVT (supraventricular tachycardia) ([facility])    in the past  . Wears glasses     Past Surgical History:  Procedure Laterality Date  . ABDOMINAL HYSTERECTOMY     BSO as well  . APPENDECTOMY    . APPLICATION OF A-CELL OF EXTREMITY Left 01/15/2015   Procedure: APPLICATION OF A-CELL OF EXTREMITY;  Surgeon: Theodoro Kos, DO;  Location: [place];  Service: Plastics;  Laterality: Left;  . CHOLECYSTECTOMY    . I&D EXTREMITY Left 01/15/2015   Procedure: IRRIGATION AND DEBRIDEMENT EXTREMITY;  Surgeon: Theodoro Kos, DO;  Location: [place];  Service: Plastics;  Laterality: Left;  Marland Kitchen MASTECTOMY  1985   Left  . RECONSTRUCTION BREAST W/ LATISSIMUS DORSI FLAP    . TOTAL SHOULDER ARTHROPLASTY  05/27/2012   Procedure: TOTAL SHOULDER ARTHROPLASTY;  Surgeon: Johnny Bridge, MD;  Location: [place];  Service: Orthopedics;  Laterality: Right;    There were no vitals filed for this visit.      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 My exercises really help.    Currently in Pain? No/denies   Multiple Pain Sites No                         [facility] Adult PT Treatment/Exercise - 12/14/16 0001      Self-Care   Self-Care ADL's   ADL's How to use wedge for semi reclined position in order to get back in her bed.      Lumbar Exercises: Stretches   Standing Side Bend --  limited ROM, 20x AROM     Lumbar Exercises: Seated   Other Seated Lumbar Exercises Noodle at thoracic: thoracic extension 2x5 3 second holds      Lumbar Exercises: Supine   Clam 15 reps  green band   Bridge --  Small lift d/t being semireclined 3 sec hold 10x   Other Supine Lumbar Exercises Semi-reclined; yellow band horizontal abd 2x10 then diagonals 2x10    Other Supine Lumbar Exercises ball squeeze 20x     Knee/Hip Exercises: Aerobic   Nustep L1 x 10 min  review of goals/status concurrent     Knee/Hip Exercises: Standing   Knee Flexion AROM;Strengthening;Both;2 sets;10 reps   Hip Abduction Stengthening;Both;2 sets;10 reps     Knee/Hip Exercises: Seated   Long Arc Quad AROM;Strengthening;Both;1 set;10  ability, Impaired flexibility, Pain, Postural dysfunction  Visit Diagnosis: Acute bilateral low back pain with bilateral sciatica  Abnormal posture  Difficulty in walking, not elsewhere classified     Problem List Patient Active Problem List   Diagnosis Date Noted  . Osteopenia 04/02/2016  . Diabetes type 2, uncontrolled ([place])   . Other specified hypothyroidism   . Acute renal failure syndrome ([place])   . Cellulitis 12/27/2014  . Cellulitis of left lower extremity 12/26/2014  . Obesity (BMI 30-39.9) 08/30/2013  . Hyperlipidemia 09/25/2012  . Breast cancer, left ([place]) 07/26/2012  . Syncope 06/26/2012  . Type 2 diabetes mellitus, uncontrolled ([place]) 05/30/2012  . Acute renal failure ([place]) 05/30/2012  . HTN (hypertension) 05/30/2012  . Postoperative anemia due to acute blood loss 05/29/2012  . Tachycardia 05/27/2012  . Hyperglycemia 05/27/2012  . Closed fracture of right proximal humerus 05/26/2012  . OVERWEIGHT 10/19/2009  . Hypothyroidism 10/16/2009  . Essential hypertension 10/16/2009  . SVT (supraventricular tachycardia) ([place]) 10/16/2009    Wannetta Langland, PTA 12/14/2016, 1:27 PM  [facility] [address] [place], [place], [address] Phone: [phone]   Fax:  [phone]  Name: Michaela Rhodes MRN: [id number] Date of Birth: December 08, 1936  ability, Impaired flexibility, Pain, Postural dysfunction  Visit Diagnosis: Acute bilateral low back pain with bilateral sciatica  Abnormal posture  Difficulty in walking, not elsewhere classified     Problem List Patient Active Problem List   Diagnosis Date Noted  . Osteopenia 04/02/2016  . Diabetes type 2, uncontrolled ([place])   . Other specified hypothyroidism   . Acute renal failure syndrome ([place])   . Cellulitis 12/27/2014  . Cellulitis of left lower extremity 12/26/2014  . Obesity (BMI 30-39.9) 08/30/2013  . Hyperlipidemia 09/25/2012  . Breast cancer, left ([place]) 07/26/2012  . Syncope 06/26/2012  . Type 2 diabetes mellitus, uncontrolled ([place]) 05/30/2012  . Acute renal failure ([place]) 05/30/2012  . HTN (hypertension) 05/30/2012  . Postoperative anemia due to acute blood loss 05/29/2012  . Tachycardia 05/27/2012  . Hyperglycemia 05/27/2012  . Closed fracture of right proximal humerus 05/26/2012  . OVERWEIGHT 10/19/2009  . Hypothyroidism 10/16/2009  . Essential hypertension 10/16/2009  . SVT (supraventricular tachycardia) ([place]) 10/16/2009    Wannetta Langland, PTA 12/14/2016, 1:27 PM  [place] [address] [place], [place], [address] Phone: [phone]   Fax:  [phone]  Name: Michaela Rhodes MRN: [id number] Date of Birth: December 08, 1936

## 2016-12-16 ENCOUNTER — Encounter: Payer: Commercial Managed Care - HMO | Admitting: Physical Therapy

## 2016-12-20 ENCOUNTER — Encounter: Payer: Self-pay | Admitting: Physical Therapy

## 2016-12-20 ENCOUNTER — Ambulatory Visit: Payer: Commercial Managed Care - HMO | Attending: Family Medicine | Admitting: Physical Therapy

## 2016-12-20 DIAGNOSIS — M5441 Lumbago with sciatica, right side: Secondary | ICD-10-CM

## 2016-12-20 DIAGNOSIS — R262 Difficulty in walking, not elsewhere classified: Secondary | ICD-10-CM

## 2016-12-20 DIAGNOSIS — R293 Abnormal posture: Secondary | ICD-10-CM | POA: Diagnosis not present

## 2016-12-20 DIAGNOSIS — M5442 Lumbago with sciatica, left side: Secondary | ICD-10-CM | POA: Diagnosis not present

## 2016-12-20 NOTE — Therapy (Signed)
[facility] [address] [place], [place], [address] Phone: [phone]   Fax:  [phone]  Physical Therapy Treatment  Patient Details  Name: Michaela Rhodes MRN: [id number] Date of Birth: 20-Oct-1936 Referring Provider: Carolann Littler MD  Encounter Date: 12/20/2016      PT End of Session - 12/20/16 1404    Visit Number 5   Number of Visits 10   Date for PT Re-Evaluation 2017/02/12   Authorization Type G-codes at visit 10, KX at visit 15 - medicare   PT Start Time 1400   PT Stop Time 1445   PT Time Calculation (min) 45 min   Activity Tolerance Patient tolerated treatment well   Behavior During Therapy [facility] for tasks assessed/performed      Past Medical History:  Diagnosis Date  . DM2 (diabetes mellitus, type 2) ([place])   . Hyperlipidemia   . Hypertension   . Hypothyroidism   . Panic attacks    mild  . SVT (supraventricular tachycardia) ([facility])    in the past  . Wears glasses     Past Surgical History:  Procedure Laterality Date  . ABDOMINAL HYSTERECTOMY     BSO as well  . APPENDECTOMY    . APPLICATION OF A-CELL OF EXTREMITY Left 01/15/2015   Procedure: APPLICATION OF A-CELL OF EXTREMITY;  Surgeon: Theodoro Kos, DO;  Location: [place];  Service: Plastics;  Laterality: Left;  . CHOLECYSTECTOMY    . I&D EXTREMITY Left 01/15/2015   Procedure: IRRIGATION AND DEBRIDEMENT EXTREMITY;  Surgeon: Theodoro Kos, DO;  Location: [place];  Service: Plastics;  Laterality: Left;  Marland Kitchen MASTECTOMY  1985   Left  . RECONSTRUCTION BREAST W/ LATISSIMUS DORSI FLAP    . TOTAL SHOULDER ARTHROPLASTY  05/27/2012   Procedure: TOTAL SHOULDER ARTHROPLASTY;  Surgeon: Johnny Bridge, MD;  Location: [place];  Service: Orthopedics;  Laterality: Right;    There were no vitals filed for this visit.      Subjective Assessment - 12/20/16 1404    Subjective I still bend over even though I'm trying not to. I  need to spend more time walking   Pertinent History osteopenia, history of multiple abdominal surgeries   Limitations Standing   How long can you sit comfortably? not limited   How long can you stand comfortably? 15 minutes   How long can you walk comfortably? 15 minutes   Diagnostic tests no   Patient Stated Goals get rid of the pain,    Currently in Pain? Yes   Pain Score 5    Pain Location Hip   Pain Orientation Right;Left   Pain Descriptors / Indicators Aching   Pain Type Acute pain   Pain Onset More than a month ago   Pain Frequency Intermittent   Aggravating Factors  Supine to sit, standing, vccuming, stairs   Pain Relieving Factors Pain meds   Effect of Pain on Daily Activities woried about falling   Multiple Pain Sites No                         [facility] Adult PT Treatment/Exercise - 12/20/16 0001      Lumbar Exercises: Stretches   Standing Side Bend 5 reps;10 seconds  Sidebend stretching; VC to bend at waist     Lumbar Exercises: Seated   Other Seated Lumbar Exercises Noodle at thoracic: thoracic extension 2x5 3 second holds      Lumbar  Exercises: Supine   Clam 15 reps  green band   Bridge --  Small lift d/t being semireclined 3 sec hold 10x   Other Supine Lumbar Exercises Semi-reclined; yellow band horizontal abd 2x10 then extension    Other Supine Lumbar Exercises ball squeeze 20x     Knee/Hip Exercises: Aerobic   Nustep L1 x 8 min  review of goals/status concurrent     Knee/Hip Exercises: Standing   Hip Abduction Stengthening;Both;2 sets;10 reps   Hip Extension Stengthening;Both;2 sets;10 reps   Other Standing Knee Exercises Standing straight  hold 10 seconds x5     Knee/Hip Exercises: Seated   Long Arc Quad AROM;Strengthening;Both;1 set;10 reps  VC to sit tall first, then perform LAQ                  PT Short Term Goals - 12/14/16 1236      PT SHORT TERM GOAL #1   Title independent with initial HEP   Time 4   Period  Weeks   Status Achieved     PT SHORT TERM GOAL #2   Title able to sleep in her bed due to being able to get in and out of bed with proper mechanics   Time 4   Period Weeks   Status On-going     PT SHORT TERM GOAL #3   Title able to walk up stairs to second floor in her home with 50% less pain   Time 4   Period Weeks   Status Achieved  50%           PT Long Term Goals - 12/20/16 1407      PT LONG TERM GOAL #1   Title independent with advanced HEP   Time 8   Period Weeks   Status On-going     PT LONG TERM GOAL #2   Title FOTO < or = 41% limitation   Baseline 61% limitation   Time 8   Period Weeks   Status On-going     PT LONG TERM GOAL #3   Title able to go up and down stairs and get in and out of bed with 75% less pain   Baseline 9/10 pain   Time 8   Period Weeks   Status On-going     PT LONG TERM GOAL #4   Title TUG <12 sec for reduced risk of falls   Time 8   Period Weeks   Status On-going               Plan - 12/20/16 1450    Clinical Impression Statement Pt presents with better posture, less forward flexion than at start of therapy. Pt reports she has been making a conicous effort to stand up straight and has stopped sitting in overly cushioned chair. Pt able to tolerate all exercsies well needing occasional verbal cues for posture. Pt will continue to benefit from skilled therapy for postural training and LE strength.    Rehab Potential Good   Clinical Impairments Affecting Rehab Potential multiple abdominal surgeries, osteopenia   PT Frequency 2x / week   PT Duration 8 weeks   PT Treatment/Interventions ADLs/Self Care Home Management;Cryotherapy;Electrical Stimulation;Moist Heat;Gait training;Stair training;Functional mobility training;Therapeutic activities;Therapeutic exercise;Balance training;Neuromuscular re-education;Patient/family education;Manual techniques;Passive range of motion;Dry needling;Taping   PT Next Visit Plan LE strength, trunk  AROM, thoracic extension, posture; sit to stands   Consulted and Agree with Plan of Care Patient      Patient will benefit from skilled  therapeutic intervention in order to improve the following deficits and impairments:  Abnormal gait, Decreased activity tolerance, Decreased balance, Decreased endurance, Decreased range of motion, Difficulty walking, Decreased strength, Increased muscle spasms, Impaired perceived functional ability, Impaired flexibility, Pain, Postural dysfunction  Visit Diagnosis: Acute bilateral low back pain with bilateral sciatica  Abnormal posture  Difficulty in walking, not elsewhere classified     Problem List Patient Active Problem List   Diagnosis Date Noted  . Osteopenia 04/02/2016  . Diabetes type 2, uncontrolled ([place])   . Other specified hypothyroidism   . Acute renal failure syndrome ([place])   . Cellulitis 12/27/2014  . Cellulitis of left lower extremity 12/26/2014  . Obesity (BMI 30-39.9) 08/30/2013  . Hyperlipidemia 09/25/2012  . Breast cancer, left ([place]) 07/26/2012  . Syncope 06/26/2012  . Type 2 diabetes mellitus, uncontrolled ([place]) 05/30/2012  . Acute renal failure ([place]) 05/30/2012  . HTN (hypertension) 05/30/2012  . Postoperative anemia due to acute blood loss 05/29/2012  . Tachycardia 05/27/2012  . Hyperglycemia 05/27/2012  . Closed fracture of right proximal humerus 05/26/2012  . OVERWEIGHT 10/19/2009  . Hypothyroidism 10/16/2009  . Essential hypertension 10/16/2009  . SVT (supraventricular tachycardia) ([place]) 10/16/2009    Mikle Bosworth PTA 12/20/2016, 2:56 PM  [facility] [address], [place], [place], [address] Phone: [phone]   Fax:  [phone]  Name: LALANA DELLAROCCA MRN: [id number] Date of Birth: 02/20/1936

## 2016-12-22 ENCOUNTER — Ambulatory Visit: Payer: Commercial Managed Care - HMO | Admitting: Physical Therapy

## 2016-12-22 ENCOUNTER — Encounter: Payer: Self-pay | Admitting: Physical Therapy

## 2016-12-22 DIAGNOSIS — R262 Difficulty in walking, not elsewhere classified: Secondary | ICD-10-CM | POA: Diagnosis not present

## 2016-12-22 DIAGNOSIS — R293 Abnormal posture: Secondary | ICD-10-CM | POA: Diagnosis not present

## 2016-12-22 DIAGNOSIS — M5441 Lumbago with sciatica, right side: Secondary | ICD-10-CM

## 2016-12-22 DIAGNOSIS — M5442 Lumbago with sciatica, left side: Secondary | ICD-10-CM | POA: Diagnosis not present

## 2016-12-22 NOTE — Therapy (Signed)
[facility] [address], [place], [place], [address] Phone: [phone]   Fax:  [phone]  Physical Therapy Treatment  Patient Details  Name: Michaela Rhodes MRN: [id number] Date of Birth: Oct 06, 1936 Referring Provider: Carolann Littler MD  Encounter Date: 12/22/2016      PT End of Session - 12/22/16 1307    Visit Number 6   Number of Visits 10   Date for PT Re-Evaluation 06-Feb-2017   Authorization Type G-codes at visit 10, KX at visit 15 - medicare   PT Start Time 1232   PT Stop Time 1318   PT Time Calculation (min) 46 min   Activity Tolerance Patient tolerated treatment well   Behavior During Therapy [facility] for tasks assessed/performed      Past Medical History:  Diagnosis Date  . DM2 (diabetes mellitus, type 2) ([place])   . Hyperlipidemia   . Hypertension   . Hypothyroidism   . Panic attacks    mild  . SVT (supraventricular tachycardia) (HCC)    in the past  . Wears glasses     Past Surgical History:  Procedure Laterality Date  . ABDOMINAL HYSTERECTOMY     BSO as well  . APPENDECTOMY    . APPLICATION OF A-CELL OF EXTREMITY Left 01/15/2015   Procedure: APPLICATION OF A-CELL OF EXTREMITY;  Surgeon: Theodoro Kos, DO;  Location: [place];  Service: Plastics;  Laterality: Left;  . CHOLECYSTECTOMY    . I&D EXTREMITY Left 01/15/2015   Procedure: IRRIGATION AND DEBRIDEMENT EXTREMITY;  Surgeon: Theodoro Kos, DO;  Location: [place];  Service: Plastics;  Laterality: Left;  Marland Kitchen MASTECTOMY  1985   Left  . RECONSTRUCTION BREAST W/ LATISSIMUS DORSI FLAP    . TOTAL SHOULDER ARTHROPLASTY  05/27/2012   Procedure: TOTAL SHOULDER ARTHROPLASTY;  Surgeon: Johnny Bridge, MD;  Location: [place];  Service: Orthopedics;  Laterality: Right;    There were no vitals filed for this visit.      Subjective Assessment - 12/22/16 1235    Subjective Feeling very stiff today after getting out of bed.     Pertinent History osteopenia, history of multiple abdominal surgeries   Limitations Standing   How long can you sit comfortably? not limited   How long can you stand comfortably? 15 minutes   How long can you walk comfortably? 15 minutes   Diagnostic tests no   Patient Stated Goals get rid of the pain,    Currently in Pain? Yes   Pain Score 7    Pain Location Hip   Pain Orientation Right;Left   Pain Descriptors / Indicators Aching   Pain Type Acute pain   Pain Onset More than a month ago   Pain Frequency Intermittent                         [facility] Adult PT Treatment/Exercise - 12/22/16 0001      Lumbar Exercises: Seated   Sit to Stand --   Other Seated Lumbar Exercises Noodle at thoracic: thoracic extension 2x5 3 second holds      Knee/Hip Exercises: Aerobic   Nustep L2 x 10 min  review of goals/status concurrent     Knee/Hip Exercises: Seated   Long Arc Quad AROM;Strengthening;Both;1 set;10 reps  VC to sit tall first, then perform LAQ   Abduction/Adduction  Both;2 sets;10 reps     Modalities   Modalities Electrical Stimulation;Moist Heat  Moist Heat Therapy   Number Minutes Moist Heat 15 Minutes   Moist Heat Location Lumbar Spine  and thoracic     Electrical Stimulation   Electrical Stimulation Location Bil lumbar and scaral spine  Pt semi reclined position   Electrical Stimulation Action IFC   Electrical Stimulation Parameters To tolerance   Electrical Stimulation Goals Pain                  PT Short Term Goals - 12/14/16 1236      PT SHORT TERM GOAL #1   Title independent with initial HEP   Time 4   Period Weeks   Status Achieved     PT SHORT TERM GOAL #2   Title able to sleep in her bed due to being able to get in and out of bed with proper mechanics   Time 4   Period Weeks   Status On-going     PT SHORT TERM GOAL #3   Title able to walk up stairs to second floor in her home with 50% less pain   Time 4   Period  Weeks   Status Achieved  50%           PT Long Term Goals - 12/20/16 1407      PT LONG TERM GOAL #1   Title independent with advanced HEP   Time 8   Period Weeks   Status On-going     PT LONG TERM GOAL #2   Title FOTO < or = 41% limitation   Baseline 61% limitation   Time 8   Period Weeks   Status On-going     PT LONG TERM GOAL #3   Title able to go up and down stairs and get in and out of bed with 75% less pain   Baseline 9/10 pain   Time 8   Period Weeks   Status On-going     PT LONG TERM GOAL #4   Title TUG <12 sec for reduced risk of falls   Time 8   Period Weeks   Status On-going               Plan - 12/22/16 1305    Clinical Impression Statement Pt with very poor posture today stating shes feeling very stiff and unable to stand upright well. Able to tolerate all exercises well with focus on good posture throughout all exercises. Estim and heat to help allow more muscle flexibilty. Pt will continue to benefit from skilled therapy for core strenght and postural training.    Rehab Potential Good   Clinical Impairments Affecting Rehab Potential multiple abdominal surgeries, osteopenia   PT Frequency 2x / week   PT Duration 8 weeks   PT Treatment/Interventions ADLs/Self Care Home Management;Cryotherapy;Electrical Stimulation;Moist Heat;Gait training;Stair training;Functional mobility training;Therapeutic activities;Therapeutic exercise;Balance training;Neuromuscular re-education;Patient/family education;Manual techniques;Passive range of motion;Dry needling;Taping   PT Next Visit Plan LE strength, trunk AROM, thoracic extension, posture; sit to stands   Consulted and Agree with Plan of Care Patient      Patient will benefit from skilled therapeutic intervention in order to improve the following deficits and impairments:  Abnormal gait, Decreased activity tolerance, Decreased balance, Decreased endurance, Decreased range of motion, Difficulty walking,  Decreased strength, Increased muscle spasms, Impaired perceived functional ability, Impaired flexibility, Pain, Postural dysfunction  Visit Diagnosis: Acute bilateral low back pain with bilateral sciatica  Abnormal posture  Difficulty in walking, not elsewhere classified     Problem List Patient Active Problem  List   Diagnosis Date Noted  . Osteopenia 04/02/2016  . Diabetes type 2, uncontrolled ([place])   . Other specified hypothyroidism   . Acute renal failure syndrome ([place])   . Cellulitis 12/27/2014  . Cellulitis of left lower extremity 12/26/2014  . Obesity (BMI 30-39.9) 08/30/2013  . Hyperlipidemia 09/25/2012  . Breast cancer, left ([place]) 07/26/2012  . Syncope 06/26/2012  . Type 2 diabetes mellitus, uncontrolled ([place]) 05/30/2012  . Acute renal failure ([place]) 05/30/2012  . HTN (hypertension) 05/30/2012  . Postoperative anemia due to acute blood loss 05/29/2012  . Tachycardia 05/27/2012  . Hyperglycemia 05/27/2012  . Closed fracture of right proximal humerus 05/26/2012  . OVERWEIGHT 10/19/2009  . Hypothyroidism 10/16/2009  . Essential hypertension 10/16/2009  . SVT (supraventricular tachycardia) ([place]) 10/16/2009    Mikle Bosworth PTA 12/22/2016, 1:30 PM  [facility] [address] [place], [place], [address] Phone: [phone]   Fax:  [phone]  Name: LAMAYA CORLETT MRN: [id number] Date of Birth: [birth year]

## 2016-12-27 ENCOUNTER — Ambulatory Visit: Payer: Commercial Managed Care - HMO | Admitting: Physical Therapy

## 2016-12-27 ENCOUNTER — Encounter: Payer: Self-pay | Admitting: Physical Therapy

## 2016-12-27 DIAGNOSIS — R293 Abnormal posture: Secondary | ICD-10-CM | POA: Diagnosis not present

## 2016-12-27 DIAGNOSIS — R262 Difficulty in walking, not elsewhere classified: Secondary | ICD-10-CM | POA: Diagnosis not present

## 2016-12-27 DIAGNOSIS — M5442 Lumbago with sciatica, left side: Principal | ICD-10-CM

## 2016-12-27 DIAGNOSIS — M5441 Lumbago with sciatica, right side: Secondary | ICD-10-CM

## 2016-12-27 NOTE — Therapy (Signed)
[facility] [address] [place], [place], [address] Phone: [phone]   Fax:  [phone]  Physical Therapy Treatment  Patient Details  Name: Michaela Rhodes MRN: [id number] Date of Birth: 1936-06-09 Referring Provider: Carolann Littler MD  Encounter Date: 12/27/2016      PT End of Session - 12/27/16 1237    Visit Number 7   Number of Visits 10   Date for PT Re-Evaluation Feb 06, 2017   Authorization Type G-codes at visit 10, KX at visit 15 - medicare   PT Start Time 1231   PT Stop Time 1326   PT Time Calculation (min) 55 min   Activity Tolerance Patient tolerated treatment well   Behavior During Therapy [facility] for tasks assessed/performed      Past Medical History:  Diagnosis Date  . DM2 (diabetes mellitus, type 2) ([place])   . Hyperlipidemia   . Hypertension   . Hypothyroidism   . Panic attacks    mild  . SVT (supraventricular tachycardia) (H[facility])    in the past  . Wears glasses     Past Surgical History:  Procedure Laterality Date  . ABDOMINAL HYSTERECTOMY     BSO as well  . APPENDECTOMY    . APPLICATION OF A-CELL OF EXTREMITY Left 01/15/2015   Procedure: APPLICATION OF A-CELL OF EXTREMITY;  Surgeon: Theodoro Kos, DO;  Location: [place];  Service: Plastics;  Laterality: Left;  . CHOLECYSTECTOMY    . I&D EXTREMITY Left 01/15/2015   Procedure: IRRIGATION AND DEBRIDEMENT EXTREMITY;  Surgeon: Theodoro Kos, DO;  Location: [place];  Service: Plastics;  Laterality: Left;  Marland Kitchen MASTECTOMY  1985   Left  . RECONSTRUCTION BREAST W/ LATISSIMUS DORSI FLAP    . TOTAL SHOULDER ARTHROPLASTY  05/27/2012   Procedure: TOTAL SHOULDER ARTHROPLASTY;  Surgeon: Johnny Bridge, MD;  Location: [place];  Service: Orthopedics;  Laterality: Right;    There were no vitals filed for this visit.      Subjective Assessment - 12/27/16 1235    Subjective Pt reports no minimal pain but unable to stand up  straight. Has been "doing some weird movements" at home.    Pertinent History osteopenia, history of multiple abdominal surgeries   Limitations Standing   How long can you sit comfortably? not limited   How long can you stand comfortably? 15 minutes   How long can you walk comfortably? 15 minutes   Diagnostic tests no   Patient Stated Goals get rid of the pain,    Currently in Pain? Yes   Pain Score 4    Pain Location Hip   Pain Orientation Right;Left   Pain Descriptors / Indicators Aching   Pain Type Acute pain   Pain Onset More than a month ago   Pain Frequency Intermittent                         [facility] Adult PT Treatment/Exercise - 12/27/16 0001      Lumbar Exercises: Seated   Other Seated Lumbar Exercises Noodle at thoracic: thoracic extension 2x5 3 second holds   Chest stretch     Lumbar Exercises: Supine   Ab Set 20 reps  red ball    Bridge 20 reps   Other Supine Lumbar Exercises Semi-reclined; yellow band horizontal abd 2x10 then extension    Other Supine Lumbar Exercises ball squeeze 20x     Knee/Hip Exercises: Aerobic   Nustep L2  x 10 min  review of goals/status concurrent     Knee/Hip Exercises: Standing   Hip Abduction Stengthening;Both;2 sets;10 reps   Hip Extension Stengthening;Both;2 sets;10 reps     Modalities   Modalities Electrical Stimulation;Moist Heat     Moist Heat Therapy   Number Minutes Moist Heat 15 Minutes   Moist Heat Location Lumbar Spine  and thoracic     Electrical Stimulation   Electrical Stimulation Location Bil lumbar and scaral spine  Pt supine with 3 pillows   Electrical Stimulation Action IFC   Electrical Stimulation Parameters To tolerance   Electrical Stimulation Goals Pain                PT Education - 12/27/16 1253    Education provided Yes   Education Details thoracic and chest stretching   Person(s) Educated Patient   Methods Explanation;Demonstration;Handout   Comprehension Verbalized  understanding          PT Short Term Goals - 12/27/16 1238      PT SHORT TERM GOAL #2   Title able to sleep in her bed due to being able to get in and out of bed with proper mechanics   Baseline Sleeping in bed some of the night but pain with getting up   Time 4   Period Weeks   Status Partially Met           PT Long Term Goals - 12/27/16 1238      PT LONG TERM GOAL #1   Title independent with advanced HEP   Time 8   Period Weeks   Status On-going     PT LONG TERM GOAL #2   Title FOTO < or = 41% limitation   Baseline 61% limitation   Time 8   Period Weeks   Status On-going               Plan - 12/27/16 1313    Clinical Impression Statement Pt presents with forward flexed posture stating she has not been able to straighten up today. Pt did well with all stretching and extension exercises. During ab set pt initially had low back pain but was able to find abdominals and complete exercise. Pt educated on importance of taking stretch breaks thorughout the day after sitting and using lumbar support while sitting. Pt will continue to benefit from skilled therapy for postural training and core strengthening.    Rehab Potential Good   Clinical Impairments Affecting Rehab Potential multiple abdominal surgeries, osteopenia   PT Frequency 2x / week   PT Duration 8 weeks   PT Treatment/Interventions ADLs/Self Care Home Management;Cryotherapy;Electrical Stimulation;Moist Heat;Gait training;Stair training;Functional mobility training;Therapeutic activities;Therapeutic exercise;Balance training;Neuromuscular re-education;Patient/family education;Manual techniques;Passive range of motion;Dry needling;Taping   PT Next Visit Plan Abdominal strengthening, throacic extension and stretching   Consulted and Agree with Plan of Care Patient      Patient will benefit from skilled therapeutic intervention in order to improve the following deficits and impairments:  Abnormal gait,  Decreased activity tolerance, Decreased balance, Decreased endurance, Decreased range of motion, Difficulty walking, Decreased strength, Increased muscle spasms, Impaired perceived functional ability, Impaired flexibility, Pain, Postural dysfunction  Visit Diagnosis: Acute bilateral low back pain with bilateral sciatica  Abnormal posture  Difficulty in walking, not elsewhere classified     Problem List Patient Active Problem List   Diagnosis Date Noted  . Osteopenia 04/02/2016  . Diabetes type 2, uncontrolled ([place])   . Other specified hypothyroidism   . Acute renal failure  syndrome ([place])   . Cellulitis 12/27/2014  . Cellulitis of left lower extremity 12/26/2014  . Obesity (BMI 30-39.9) 08/30/2013  . Hyperlipidemia 09/25/2012  . Breast cancer, left ([place]) 07/26/2012  . Syncope 06/26/2012  . Type 2 diabetes mellitus, uncontrolled ([place]) 05/30/2012  . Acute renal failure ([place]) 05/30/2012  . HTN (hypertension) 05/30/2012  . Postoperative anemia due to acute blood loss 05/29/2012  . Tachycardia 05/27/2012  . Hyperglycemia 05/27/2012  . Closed fracture of right proximal humerus 05/26/2012  . OVERWEIGHT 10/19/2009  . Hypothyroidism 10/16/2009  . Essential hypertension 10/16/2009  . SVT (supraventricular tachycardia) ([place]) 10/16/2009    Mikle Bosworth PTA 12/27/2016, 1:18 PM  [facility] [address], [place], [place], [address] Phone: [phone]   Fax:  [phone]  Name: NASTACIA RAYBUCK MRN: [id number] Date of Birth: 11/06/1936

## 2016-12-27 NOTE — Patient Instructions (Signed)
(  Home) Extension: Thoracic With Lumbar Lock - Sitting    Sit with back against chair, knees bent, hands locked behind head. Breathe in, extending head and trunk over chair back. Breathe out.  Repeat __10__ times per set. Do __2__ sets per session. Do __3__ sessions per week.  Copyright  VHI. All rights reserved.  (Home) Extension / Rotation: Cervico-Thoracic    Sit with trunk leaning back, towel or wedge at base of neck. Rotate head to left as far as possible without pain. Tuck chin and hold position __5__ seconds, pressing base of neck against wedge. Repeat __10__ times per set. Do __2__ sets per session. Do __3__ sessions per week.  Copyright  VHI. All rights reserved.   Mikle Bosworth, PTA 12/27/16 12:53 PM  [facility] [address], [place], [place] [address] Phone # [phone] Fax [phone]

## 2016-12-29 ENCOUNTER — Encounter: Payer: Commercial Managed Care - HMO | Admitting: Physical Therapy

## 2017-01-03 ENCOUNTER — Encounter: Payer: Commercial Managed Care - HMO | Admitting: Physical Therapy

## 2017-01-05 ENCOUNTER — Encounter: Payer: Commercial Managed Care - HMO | Admitting: Physical Therapy

## 2017-01-10 ENCOUNTER — Ambulatory Visit: Payer: Commercial Managed Care - HMO | Admitting: Physical Therapy

## 2017-01-10 ENCOUNTER — Encounter: Payer: Self-pay | Admitting: Physical Therapy

## 2017-01-10 DIAGNOSIS — M5442 Lumbago with sciatica, left side: Secondary | ICD-10-CM | POA: Diagnosis not present

## 2017-01-10 DIAGNOSIS — R293 Abnormal posture: Secondary | ICD-10-CM

## 2017-01-10 DIAGNOSIS — M5441 Lumbago with sciatica, right side: Secondary | ICD-10-CM

## 2017-01-10 DIAGNOSIS — R262 Difficulty in walking, not elsewhere classified: Secondary | ICD-10-CM | POA: Diagnosis not present

## 2017-01-10 NOTE — Therapy (Signed)
[facility] [address], [place], [place], [address] Phone: [phone]   Fax:  [phone]  Physical Therapy Treatment  Patient Details  Name: Michaela Rhodes MRN: [id number] Date of Birth: Jan 30, 1936 Referring Provider: Carolann Littler MD  Encounter Date: 01/10/2017      PT End of Session - 01/10/17 1320    Visit Number 8   Number of Visits 10   Date for PT Re-Evaluation 29-Jan-2017   Authorization Type G-codes at visit 10, KX at visit 15 - medicare   PT Start Time 1230   PT Stop Time 1323   PT Time Calculation (min) 53 min   Activity Tolerance Patient tolerated treatment well   Behavior During Therapy [facility] for tasks assessed/performed      Past Medical History:  Diagnosis Date  . DM2 (diabetes mellitus, type 2) ([place])   . Hyperlipidemia   . Hypertension   . Hypothyroidism   . Panic attacks    mild  . SVT (supraventricular tachycardia) ([facility])    in the past  . Wears glasses     Past Surgical History:  Procedure Laterality Date  . ABDOMINAL HYSTERECTOMY     BSO as well  . APPENDECTOMY    . APPLICATION OF A-CELL OF EXTREMITY Left 01/15/2015   Procedure: APPLICATION OF A-CELL OF EXTREMITY;  Surgeon: Theodoro Kos, DO;  Location: [place];  Service: Plastics;  Laterality: Left;  . CHOLECYSTECTOMY    . I&D EXTREMITY Left 01/15/2015   Procedure: IRRIGATION AND DEBRIDEMENT EXTREMITY;  Surgeon: Theodoro Kos, DO;  Location: [place];  Service: Plastics;  Laterality: Left;  Marland Kitchen MASTECTOMY  1985   Left  . RECONSTRUCTION BREAST W/ LATISSIMUS DORSI FLAP    . TOTAL SHOULDER ARTHROPLASTY  05/27/2012   Procedure: TOTAL SHOULDER ARTHROPLASTY;  Surgeon: Johnny Bridge, MD;  Location: [place];  Service: Orthopedics;  Laterality: Right;    There were no vitals filed for this visit.      Subjective Assessment - 01/10/17 1230    Subjective Pt reports pain in anteriror Rt thigh especially  when putting pressure through LE with walking. Most pain is when getting out of bed.    Pertinent History osteopenia, history of multiple abdominal surgeries   Limitations Standing   How long can you sit comfortably? not limited   How long can you stand comfortably? 15 minutes   How long can you walk comfortably? 15 minutes   Diagnostic tests no   Patient Stated Goals get rid of the pain,    Currently in Pain? Yes   Pain Score 7    Pain Location Hip   Pain Orientation Right;Left   Pain Descriptors / Indicators Aching   Pain Type Acute pain   Pain Radiating Towards Down Lt leg   Pain Onset More than a month ago   Pain Frequency Intermittent                         [facility] Adult PT Treatment/Exercise - 01/10/17 0001      Lumbar Exercises: Seated   Long Arc Quad on Chair Strengthening;Both;2 sets;10 reps   Other Seated Lumbar Exercises Noodle at thoracic: thoracic extension 2x5 3 second holds   Chest stretch     Lumbar Exercises: Supine   Bridge 20 reps   Other Supine Lumbar Exercises Semi-reclined; yellow band horizontal abd 2x10 then extension    Other Supine Lumbar Exercises ball squeeze  20x     Knee/Hip Exercises: Aerobic   Nustep L2 x 10 min  review of goals/status concurrent     Knee/Hip Exercises: Seated   Abduction/Adduction  Both;2 sets;10 reps     Modalities   Modalities Electrical Stimulation;Moist Heat     Moist Heat Therapy   Number Minutes Moist Heat 15 Minutes   Moist Heat Location Lumbar Spine     Electrical Stimulation   Electrical Stimulation Location Rt hip anteriror and posterior   Electrical Stimulation Action Pre mod   Electrical Stimulation Parameters 55m 7 ma   Electrical Stimulation Goals Pain     Manual Therapy   Manual Therapy Myofascial release;Soft tissue mobilization   Manual therapy comments Pt semi reclined   Soft tissue mobilization Rt quad   Myofascial Release Rt psoas and pectinious                  PT  Short Term Goals - 12/27/16 1238      PT SHORT TERM GOAL #2   Title able to sleep in her bed due to being able to get in and out of bed with proper mechanics   Baseline Sleeping in bed some of the night but pain with getting up   Time 4   Period Weeks   Status Partially Met           PT Long Term Goals - 12/27/16 1238      PT LONG TERM GOAL #1   Title independent with advanced HEP   Time 8   Period Weeks   Status On-going     PT LONG TERM GOAL #2   Title FOTO < or = 41% limitation   Baseline 61% limitation   Time 8   Period Weeks   Status On-going               Plan - 01/10/17 1316    Clinical Impression Statement Pt continues to have increased forward flexion posture to avoid hip pain. Pt reports having new hip pain across front of hip upon weight bearing that feels like a shooting pinch. Pt very tight in Rt psoas and deep hip flexors. Minimal tightness with internal or external rotation. Pt able to tolerate all exercises well with no increase in pain. Pt will continue to benefit from skilled therapy for hip and core stability.    Rehab Potential Good   Clinical Impairments Affecting Rehab Potential multiple abdominal surgeries, osteopenia   PT Frequency 2x / week   PT Duration 8 weeks   PT Treatment/Interventions ADLs/Self Care Home Management;Cryotherapy;Electrical Stimulation;Moist Heat;Gait training;Stair training;Functional mobility training;Therapeutic activities;Therapeutic exercise;Balance training;Neuromuscular re-education;Patient/family education;Manual techniques;Passive range of motion;Dry needling;Taping   PT Next Visit Plan Anterior hip stretching, hip extensor strengthening   Consulted and Agree with Plan of Care Patient      Patient will benefit from skilled therapeutic intervention in order to improve the following deficits and impairments:  Abnormal gait, Decreased activity tolerance, Decreased balance, Decreased endurance, Decreased range of  motion, Difficulty walking, Decreased strength, Increased muscle spasms, Impaired perceived functional ability, Impaired flexibility, Pain, Postural dysfunction  Visit Diagnosis: Acute bilateral low back pain with bilateral sciatica  Abnormal posture  Difficulty in walking, not elsewhere classified     Problem List Patient Active Problem List   Diagnosis Date Noted  . Osteopenia 04/02/2016  . Diabetes type 2, uncontrolled ([place]   . Other specified hypothyroidism   . Acute renal failure syndrome ([place]   . Cellulitis 12/27/2014  .  Cellulitis of left lower extremity 12/26/2014  . Obesity (BMI 30-39.9) 08/30/2013  . Hyperlipidemia 09/25/2012  . Breast cancer, left ([place]) 07/26/2012  . Syncope 06/26/2012  . Type 2 diabetes mellitus, uncontrolled ([place]) 05/30/2012  . Acute renal failure ([place]) 05/30/2012  . HTN (hypertension) 05/30/2012  . Postoperative anemia due to acute blood loss 05/29/2012  . Tachycardia 05/27/2012  . Hyperglycemia 05/27/2012  . Closed fracture of right proximal humerus 05/26/2012  . OVERWEIGHT 10/19/2009  . Hypothyroidism 10/16/2009  . Essential hypertension 10/16/2009  . SVT (supraventricular tachycardia) ([place]) 10/16/2009    Mikle Bosworth PTA 01/10/2017, 1:21 PM  [facility] [address] [place], [place], [address] Phone: [phone]   Fax:  [phone]  Name: ABIGAEL MOGLE MRN: [id number] Date of Birth: [birth year]

## 2017-01-12 ENCOUNTER — Encounter: Payer: Self-pay | Admitting: Physical Therapy

## 2017-01-12 ENCOUNTER — Ambulatory Visit: Payer: Commercial Managed Care - HMO | Admitting: Physical Therapy

## 2017-01-12 DIAGNOSIS — M5442 Lumbago with sciatica, left side: Principal | ICD-10-CM

## 2017-01-12 DIAGNOSIS — M5441 Lumbago with sciatica, right side: Secondary | ICD-10-CM | POA: Diagnosis not present

## 2017-01-12 DIAGNOSIS — R262 Difficulty in walking, not elsewhere classified: Secondary | ICD-10-CM | POA: Diagnosis not present

## 2017-01-12 DIAGNOSIS — R293 Abnormal posture: Secondary | ICD-10-CM

## 2017-01-12 NOTE — Patient Instructions (Signed)
Quads / HF, Side-Lying    Lie on one side, legs bent. Hold foot of top leg with same-side hand. Raise leg. Hold ___ seconds.  Repeat ___ times per session. Do ___ sessions per day.  Copyright  VHI. All rights reserved.  Hip Flexor Stretch    Lying on back near edge of bed, bend one leg, foot flat. Hang other leg over edge, relaxed, thigh resting entirely on bed for ____ minutes. Repeat ____ times. Do ____ sessions per day. Advanced Exercise: Bend knee back keeping thigh in contact with bed.  http://gt2.exer.us/347   Copyright  VHI. All rights reserved.  Flexors, Standing    Stand, one foot on chair. Use support for balance, if needed. Slowly shift weight toward raised knee until stretch is felt in quadriceps of standing leg and hamstrings of forward leg. Hold ___ seconds.  Repeat ___ times per session. Do ___ sessions per day.  Copyright  VHI. All rights reserved.  Mikle Bosworth, PTA 01/12/17 1:07 PM  [facility] [address] [place], [place] [address] Phone # [phone] Fax [phone]

## 2017-01-12 NOTE — Therapy (Signed)
[facility] [address] [place], [place], [address] Phone: [phone]   Fax:  [phone]  Physical Therapy Treatment  Patient Details  Name: Michaela Rhodes MRN: [id number] Date of Birth: Mar 22, 1936 Referring Provider: Carolann Littler MD  Encounter Date: 01/12/2017      PT End of Session - 01/12/17 1232    Visit Number 9   Number of Visits 10   Date for PT Re-Evaluation 01-27-17   Authorization Type G-codes at visit 10, KX at visit 15 - medicare   PT Start Time 1230   PT Stop Time 1308   PT Time Calculation (min) 38 min   Activity Tolerance Patient tolerated treatment well   Behavior During Therapy [facility] for tasks assessed/performed      Past Medical History:  Diagnosis Date  . DM2 (diabetes mellitus, type 2) ([place])   . Hyperlipidemia   . Hypertension   . Hypothyroidism   . Panic attacks    mild  . SVT (supraventricular tachycardia) (H[facility])    in the past  . Wears glasses     Past Surgical History:  Procedure Laterality Date  . ABDOMINAL HYSTERECTOMY     BSO as well  . APPENDECTOMY    . APPLICATION OF A-CELL OF EXTREMITY Left 01/15/2015   Procedure: APPLICATION OF A-CELL OF EXTREMITY;  Surgeon: Theodoro Kos, DO;  Location: [place];  Service: Plastics;  Laterality: Left;  . CHOLECYSTECTOMY    . I&D EXTREMITY Left 01/15/2015   Procedure: IRRIGATION AND DEBRIDEMENT EXTREMITY;  Surgeon: Theodoro Kos, DO;  Location: [place];  Service: Plastics;  Laterality: Left;  Marland Kitchen MASTECTOMY  1985   Left  . RECONSTRUCTION BREAST W/ LATISSIMUS DORSI FLAP    . TOTAL SHOULDER ARTHROPLASTY  05/27/2012   Procedure: TOTAL SHOULDER ARTHROPLASTY;  Surgeon: Johnny Bridge, MD;  Location: [place];  Service: Orthopedics;  Laterality: Right;    There were no vitals filed for this visit.      Subjective Assessment - 01/12/17 1231    Subjective Pt reports no pain at the moment as long as she  bends forward   Pertinent History osteopenia, history of multiple abdominal surgeries   Limitations Standing   How long can you sit comfortably? not limited   How long can you stand comfortably? 15 minutes   How long can you walk comfortably? 15 minutes   Diagnostic tests no   Patient Stated Goals get rid of the pain,    Currently in Pain? No/denies                         [facility] Adult PT Treatment/Exercise - 01/12/17 0001      Lumbar Exercises: Stretches   Standing Extension --  Psoas stretch at stairs     Lumbar Exercises: Supine   Clam 15 reps  green band   Bridge 20 reps   Other Supine Lumbar Exercises ball squeeze 20x     Knee/Hip Exercises: Stretches   Sports administrator Right;10 seconds  Sidelying     Knee/Hip Exercises: Aerobic   Nustep L2 x 10 min  review of goals/status concurrent     Knee/Hip Exercises: Standing   Knee Flexion Strengthening;Both;2 sets;10 reps  Hamstring curls   Hip Extension Stengthening;Both;2 sets;10 reps   Forward Step Up 10 reps;Both     Knee/Hip Exercises: Seated   Sit to Sand 20 reps  on blue mat 2x10  PT Education - 01/12/17 1308    Education provided Yes   Education Details Hip stretch   Person(s) Educated Patient   Methods Explanation;Demonstration;Handout   Comprehension Verbalized understanding          PT Short Term Goals - 12/27/16 1238      PT SHORT TERM GOAL #2   Title able to sleep in her bed due to being able to get in and out of bed with proper mechanics   Baseline Sleeping in bed some of the night but pain with getting up   Time 4   Period Weeks   Status Partially Met           PT Long Term Goals - 12/27/16 1238      PT LONG TERM GOAL #1   Title independent with advanced HEP   Time 8   Period Weeks   Status On-going     PT LONG TERM GOAL #2   Title FOTO < or = 41% limitation   Baseline 61% limitation   Time 8   Period Weeks   Status On-going                Plan - 01/12/17 1425    Clinical Impression Statement Pt reports no hip pain as long as she is leaning forward. Pt able to tolerate hip stretching well and given home exercises to stretch daily. Pt will continue to benefit from skilled therapy for cores stability, LE strengthening, and hip stretching.   Rehab Potential Good   Clinical Impairments Affecting Rehab Potential multiple abdominal surgeries, osteopenia   PT Frequency 2x / week   PT Duration 8 weeks   PT Treatment/Interventions ADLs/Self Care Home Management;Cryotherapy;Electrical Stimulation;Moist Heat;Gait training;Stair training;Functional mobility training;Therapeutic activities;Therapeutic exercise;Balance training;Neuromuscular re-education;Patient/family education;Manual techniques;Passive range of motion;Dry needling;Taping   PT Next Visit Plan Anterior hip stretching, hip extensor strengthening   Consulted and Agree with Plan of Care Patient      Patient will benefit from skilled therapeutic intervention in order to improve the following deficits and impairments:  Abnormal gait, Decreased activity tolerance, Decreased balance, Decreased endurance, Decreased range of motion, Difficulty walking, Decreased strength, Increased muscle spasms, Impaired perceived functional ability, Impaired flexibility, Pain, Postural dysfunction  Visit Diagnosis: Acute bilateral low back pain with bilateral sciatica  Abnormal posture  Difficulty in walking, not elsewhere classified     Problem List Patient Active Problem List   Diagnosis Date Noted  . Osteopenia 04/02/2016  . Diabetes type 2, uncontrolled ([place])   . Other specified hypothyroidism   . Acute renal failure syndrome ([place])   . Cellulitis 12/27/2014  . Cellulitis of left lower extremity 12/26/2014  . Obesity (BMI 30-39.9) 08/30/2013  . Hyperlipidemia 09/25/2012  . Breast cancer, left ([place]) 07/26/2012  . Syncope 06/26/2012  . Type 2 diabetes mellitus,  uncontrolled ([place]) 05/30/2012  . Acute renal failure ([place]) 05/30/2012  . HTN (hypertension) 05/30/2012  . Postoperative anemia due to acute blood loss 05/29/2012  . Tachycardia 05/27/2012  . Hyperglycemia 05/27/2012  . Closed fracture of right proximal humerus 05/26/2012  . OVERWEIGHT 10/19/2009  . Hypothyroidism 10/16/2009  . Essential hypertension 10/16/2009  . SVT (supraventricular tachycardia) ([place]) 10/16/2009    Mikle Bosworth PTA 01/12/2017, 2:28 PM  [facility] [address] [place], [place], [address] Phone: [phone]   Fax:  [phone]  Name: Michaela Rhodes MRN: [id number] Date of Birth: 08-Dec-1936

## 2017-01-13 DIAGNOSIS — M25551 Pain in right hip: Secondary | ICD-10-CM | POA: Diagnosis not present

## 2017-01-17 ENCOUNTER — Encounter: Payer: Self-pay | Admitting: Physical Therapy

## 2017-01-17 ENCOUNTER — Ambulatory Visit: Payer: Commercial Managed Care - HMO | Admitting: Physical Therapy

## 2017-01-17 DIAGNOSIS — M5442 Lumbago with sciatica, left side: Principal | ICD-10-CM

## 2017-01-17 DIAGNOSIS — R293 Abnormal posture: Secondary | ICD-10-CM | POA: Diagnosis not present

## 2017-01-17 DIAGNOSIS — M5441 Lumbago with sciatica, right side: Secondary | ICD-10-CM

## 2017-01-17 DIAGNOSIS — R262 Difficulty in walking, not elsewhere classified: Secondary | ICD-10-CM | POA: Diagnosis not present

## 2017-01-17 NOTE — Therapy (Addendum)
[facility] [address], [place], [place], [address] Phone: [phone]   Fax:  [phone]  Physical Therapy Treatment  Patient Details  Name: Michaela Rhodes MRN: [id number] Date of Birth: [birth year] Referring Provider: Carolann Littler MD  Encounter Date: 01/17/2017      PT End of Session - 01/17/17 1227    Visit Number 10   Number of Visits 10   Date for PT Re-Evaluation 02/09/17   Authorization Type G-codes at visit 10, KX at visit 15 - medicare   PT Start Time 1226   PT Stop Time 1320   PT Time Calculation (min) 54 min   Activity Tolerance Patient tolerated treatment well   Behavior During Therapy [facility] for tasks assessed/performed      Past Medical History:  Diagnosis Date  . DM2 (diabetes mellitus, type 2) ([place])   . Hyperlipidemia   . Hypertension   . Hypothyroidism   . Panic attacks    mild  . SVT (supraventricular tachycardia) ([facility])    in the past  . Wears glasses     Past Surgical History:  Procedure Laterality Date  . ABDOMINAL HYSTERECTOMY     BSO as well  . APPENDECTOMY    . APPLICATION OF A-CELL OF EXTREMITY Left 01/15/2015   Procedure: APPLICATION OF A-CELL OF EXTREMITY;  Surgeon: Theodoro Kos, DO;  Location: [place];  Service: Plastics;  Laterality: Left;  . CHOLECYSTECTOMY    . I&D EXTREMITY Left 01/15/2015   Procedure: IRRIGATION AND DEBRIDEMENT EXTREMITY;  Surgeon: Theodoro Kos, DO;  Location: [place];  Service: Plastics;  Laterality: Left;  Marland Kitchen MASTECTOMY  1985   Left  . RECONSTRUCTION BREAST W/ LATISSIMUS DORSI FLAP    . TOTAL SHOULDER ARTHROPLASTY  05/27/2012   Procedure: TOTAL SHOULDER ARTHROPLASTY;  Surgeon: Johnny Bridge, MD;  Location: [place];  Service: Orthopedics;  Laterality: Right;    There were no vitals filed for this visit.      Subjective Assessment - 01/17/17 1224    Subjective Pt reports continued hip pain. Pt had Xray which  showed some arthritis but no explaination for pinching and radiating. Pt scheduled to have MRI next week.    Pertinent History osteopenia, history of multiple abdominal surgeries   Limitations Standing   How long can you sit comfortably? not limited   How long can you stand comfortably? 15 minutes   How long can you walk comfortably? 15 minutes   Diagnostic tests no   Patient Stated Goals get rid of the pain,    Currently in Pain? Yes   Pain Score 5    Pain Location Hip   Pain Orientation Right;Left   Pain Descriptors / Indicators Aching   Pain Type Acute pain   Pain Radiating Towards Down Lt leg   Pain Onset More than a month ago   Pain Frequency Intermittent   Aggravating Factors  Supine to sit, standing, vaccuuming, stairs   Pain Relieving Factors pain meds   Effect of Pain on Daily Activities worried about falling   Multiple Pain Sites No            [facility] PT Assessment - 01/17/17 0001      Observation/Other Assessments   Focus on Therapeutic Outcomes (FOTO)  54% limitation     Timed Up and Go Test   Normal TUG (seconds) 17  UE use to stand  [facility] Adult PT Treatment/Exercise - 01/17/17 0001      Lumbar Exercises: Stretches   Standing Extension --  Psoas stretch at stairs Rt hip only     Lumbar Exercises: Supine   Bridge 20 reps     Lumbar Exercises: Sidelying   Clam 20 reps     Knee/Hip Exercises: Stretches   Sports administrator Right;10 seconds  Sidelying     Knee/Hip Exercises: Aerobic   Nustep L2 x 10 min  review of goals/status concurrent     Knee/Hip Exercises: Standing   Knee Flexion Strengthening;Both;2 sets;10 reps  Hamstring curls   Hip Extension Stengthening;Both;2 sets;10 reps   Forward Step Up Both;20 reps     Knee/Hip Exercises: Seated   Sit to Sand 20 reps  on blue mat 2x10                  PT Short Term Goals - 01/17/17 1227      PT SHORT TERM GOAL #2   Title able to sleep in her bed due to  being able to get in and out of bed with proper mechanics   Baseline Sleeping in bed some of the night but pain with getting up   Time 4   Period Weeks   Status Partially Met     PT SHORT TERM GOAL #3   Title able to walk up stairs to second floor in her home with 50% less pain   Time 4   Period Weeks   Status Achieved           PT Long Term Goals - 01/17/17 1227      PT LONG TERM GOAL #1   Title independent with advanced HEP   Time 8   Period Weeks   Status On-going     PT LONG TERM GOAL #2   Title FOTO < or = 41% limitation   Time 8   Period Weeks   Status On-going     PT LONG TERM GOAL #3   Title able to go up and down stairs and get in and out of bed with 75% less pain   Time 8   Period Weeks   Status On-going     PT LONG TERM GOAL #4   Title TUG <12 sec for reduced risk of falls   Time 8   Period Weeks   Status On-going     PT LONG TERM GOAL #5   Title 5 times sit to stand <13 sec in order to be within norms so she will have reduced risk of falls   Time 8   Period Weeks   Status On-going     PT LONG TERM GOAL #6   Title Pt will report feeling 50% less likely to avoid activity due to fear of pain or falling than prior to starting PT.   Time 8   Period Weeks   Status On-going               Plan - 01/17/17 1255    Clinical Impression Statement Pt reports continued hip pinching unless leaning forward but does feel that it's a little better. Pt is scheduled to have MRI on hip next week. Pt able to tolerate all exercises well and doing well with hip stretching. Pt will continue to benefit from skilled therapy for hip strength and posutral training.    Rehab Potential Good   Clinical Impairments Affecting Rehab Potential multiple abdominal surgeries, osteopenia   PT Frequency  2x / week   PT Duration 8 weeks   PT Treatment/Interventions ADLs/Self Care Home Management;Cryotherapy;Electrical Stimulation;Moist Heat;Gait training;Stair  training;Functional mobility training;Therapeutic activities;Therapeutic exercise;Balance training;Neuromuscular re-education;Patient/family education;Manual techniques;Passive range of motion;Dry needling;Taping   PT Next Visit Plan Anterior hip stretching, hip extensor strengthening   Consulted and Agree with Plan of Care Patient      Patient will benefit from skilled therapeutic intervention in order to improve the following deficits and impairments:  Abnormal gait, Decreased activity tolerance, Decreased balance, Decreased endurance, Decreased range of motion, Difficulty walking, Decreased strength, Increased muscle spasms, Impaired perceived functional ability, Impaired flexibility, Pain, Postural dysfunction  Visit Diagnosis: Acute bilateral low back pain with bilateral sciatica  Abnormal posture  Difficulty in walking, not elsewhere classified Gcodes: Based on FOTO, TUG, 5 times sit to stand, clinical impressions Current: CL Goal: CK Zannie Cove, PT 01/17/17 1:56 PM  Problem List Patient Active Problem List   Diagnosis Date Noted  . Osteopenia 04/02/2016  . Diabetes type 2, uncontrolled ([place])   . Other specified hypothyroidism   . Acute renal failure syndrome ([place])   . Cellulitis 12/27/2014  . Cellulitis of left lower extremity 12/26/2014  . Obesity (BMI 30-39.9) 08/30/2013  . Hyperlipidemia 09/25/2012  . Breast cancer, left ([place]) 07/26/2012  . Syncope 06/26/2012  . Type 2 diabetes mellitus, uncontrolled ([place]) 05/30/2012  . Acute renal failure ([place]) 05/30/2012  . HTN (hypertension) 05/30/2012  . Postoperative anemia due to acute blood loss 05/29/2012  . Tachycardia 05/27/2012  . Hyperglycemia 05/27/2012  . Closed fracture of right proximal humerus 05/26/2012  . OVERWEIGHT 10/19/2009  . Hypothyroidism 10/16/2009  . Essential hypertension 10/16/2009  . SVT (supraventricular tachycardia) ([place]) 10/16/2009    Mikle Bosworth, PTA 01/17/17 1:15 PM   [facility] [address] [place], [place], [address] Phone: [phone]   Fax:  [phone]  Name: Michaela Rhodes MRN: [id number] Date of Birth: 14-Feb-1936  Zannie Cove, PT 01/17/17 1:56 PM   PHYSICAL THERAPY DISCHARGE SUMMARY  Visits from Start of Care: 10  Current functional level related to goals / functional outcomes: Did not return since last visit, see above goals   Remaining deficits: See above   Education / Equipment: HEP  Plan: Patient agrees to discharge.  Patient goals were not met. Patient is being discharged due to not returning since the last visit.  ?????

## 2017-01-19 ENCOUNTER — Encounter: Payer: Commercial Managed Care - HMO | Admitting: Physical Therapy

## 2017-01-19 DIAGNOSIS — M25551 Pain in right hip: Secondary | ICD-10-CM | POA: Diagnosis not present

## 2017-01-24 ENCOUNTER — Encounter: Payer: Commercial Managed Care - HMO | Admitting: Physical Therapy

## 2017-01-25 DIAGNOSIS — M25551 Pain in right hip: Secondary | ICD-10-CM | POA: Diagnosis not present

## 2017-01-25 DIAGNOSIS — M1611 Unilateral primary osteoarthritis, right hip: Secondary | ICD-10-CM | POA: Diagnosis not present

## 2017-01-26 ENCOUNTER — Encounter: Payer: Commercial Managed Care - HMO | Admitting: Physical Therapy

## 2017-02-02 DIAGNOSIS — M25551 Pain in right hip: Secondary | ICD-10-CM | POA: Diagnosis not present

## 2017-02-09 ENCOUNTER — Other Ambulatory Visit: Payer: Self-pay | Admitting: Family Medicine

## 2017-02-13 ENCOUNTER — Other Ambulatory Visit: Payer: Self-pay

## 2017-02-13 MED ORDER — LEVOTHYROXINE SODIUM 137 MCG PO TABS: 137.0000 ug | ORAL_TABLET | Freq: Every day | ORAL | 2 refills | Status: DC

## 2017-02-16 DIAGNOSIS — M545 Low back pain: Secondary | ICD-10-CM | POA: Diagnosis not present

## 2017-02-16 DIAGNOSIS — M1611 Unilateral primary osteoarthritis, right hip: Secondary | ICD-10-CM | POA: Diagnosis not present

## 2017-02-23 DIAGNOSIS — M545 Low back pain: Secondary | ICD-10-CM | POA: Diagnosis not present

## 2017-02-26 ENCOUNTER — Other Ambulatory Visit: Payer: Self-pay | Admitting: Family Medicine

## 2017-03-03 DIAGNOSIS — M25551 Pain in right hip: Secondary | ICD-10-CM | POA: Diagnosis not present

## 2017-03-03 DIAGNOSIS — M545 Low back pain: Secondary | ICD-10-CM | POA: Diagnosis not present

## 2017-03-10 DIAGNOSIS — S32000S Wedge compression fracture of unspecified lumbar vertebra, sequela: Secondary | ICD-10-CM | POA: Diagnosis not present

## 2017-03-10 DIAGNOSIS — M545 Low back pain: Secondary | ICD-10-CM | POA: Diagnosis not present

## 2017-03-14 ENCOUNTER — Other Ambulatory Visit: Payer: Self-pay | Admitting: Physical Medicine and Rehabilitation

## 2017-03-14 ENCOUNTER — Encounter: Payer: Self-pay | Admitting: Family Medicine

## 2017-03-15 ENCOUNTER — Other Ambulatory Visit: Payer: Self-pay | Admitting: Physical Medicine and Rehabilitation

## 2017-03-15 ENCOUNTER — Ambulatory Visit
Admission: RE | Admit: 2017-03-15 | Discharge: 2017-03-15 | Disposition: A | Discharge status: Home / Self Care | Payer: Commercial Managed Care - HMO | Source: Ambulatory Visit | Attending: Physical Medicine and Rehabilitation | Admitting: Physical Medicine and Rehabilitation

## 2017-03-15 DIAGNOSIS — K7689 Other specified diseases of liver: Secondary | ICD-10-CM

## 2017-03-15 DIAGNOSIS — IMO0001 Reserved for inherently not codable concepts without codable children: Secondary | ICD-10-CM

## 2017-03-17 DIAGNOSIS — T6591XA Toxic effect of unspecified substance, accidental (unintentional), initial encounter: Secondary | ICD-10-CM | POA: Diagnosis not present

## 2017-03-17 DIAGNOSIS — R221 Localized swelling, mass and lump, neck: Secondary | ICD-10-CM | POA: Diagnosis not present

## 2017-03-17 NOTE — Progress Notes (Signed)
03/17/17 11:10 am.  Mare Ferrari, PA from [facility] in [place] is seeing this patient today for neck and facial swelling.  Slightly elevated bp and glucose.  CT scan on 03/15/17 approx 3 pm.  The PA is unsure she is having a delayed contrast reaction.  Spoke w/ Dr Candise Che.  He thinks it is not a typical reaction and unusual.  Suggested she take benadryl.   Relayed this info to the PA.  She was thankful and will give her liquid benadryl and have pt. Hydrate.

## 2017-04-05 ENCOUNTER — Encounter: Payer: Self-pay | Admitting: Family Medicine

## 2017-04-05 ENCOUNTER — Other Ambulatory Visit: Payer: Self-pay | Admitting: *Deleted

## 2017-04-05 ENCOUNTER — Ambulatory Visit (INDEPENDENT_AMBULATORY_CARE_PROVIDER_SITE_OTHER): Payer: Commercial Managed Care - HMO | Admitting: Family Medicine

## 2017-04-05 VITALS — BP 130/100 | HR 100 | Temp 98.6°F | Wt 216.1 lb

## 2017-04-05 DIAGNOSIS — E785 Hyperlipidemia, unspecified: Secondary | ICD-10-CM

## 2017-04-05 DIAGNOSIS — E1165 Type 2 diabetes mellitus with hyperglycemia: Secondary | ICD-10-CM | POA: Diagnosis not present

## 2017-04-05 DIAGNOSIS — E039 Hypothyroidism, unspecified: Secondary | ICD-10-CM

## 2017-04-05 DIAGNOSIS — I10 Essential (primary) hypertension: Secondary | ICD-10-CM | POA: Diagnosis not present

## 2017-04-05 LAB — HEPATIC FUNCTION PANEL
ALT: 13 U/L (ref 0–35)
AST: 13 U/L (ref 0–37)
Albumin: 4.1 g/dL (ref 3.5–5.2)
Alkaline Phosphatase: 145 U/L — ABNORMAL HIGH (ref 39–117)
Bilirubin, Direct: 0.1 mg/dL (ref 0.0–0.3)
TOTAL PROTEIN: 6.9 g/dL (ref 6.0–8.3)
Total Bilirubin: 0.4 mg/dL (ref 0.2–1.2)

## 2017-04-05 LAB — POCT GLYCOSYLATED HEMOGLOBIN (HGB A1C): Hemoglobin A1C: 8.3

## 2017-04-05 LAB — BASIC METABOLIC PANEL
BUN: 32 mg/dL — AB (ref 6–23)
CO2: 27 mEq/L (ref 19–32)
Calcium: 9.7 mg/dL (ref 8.4–10.5)
Chloride: 106 mEq/L (ref 96–112)
Creatinine, Ser: 1.09 mg/dL (ref 0.40–1.20)
GFR: 51.49 mL/min — AB (ref >60.00)
GLUCOSE: 219 mg/dL — AB (ref 70–99)
Potassium: 4.9 mEq/L (ref 3.5–5.1)
Sodium: 141 mEq/L (ref 135–145)

## 2017-04-05 LAB — LIPID PANEL
Cholesterol: 205 mg/dL — ABNORMAL HIGH (ref 0–200)
HDL: 51 mg/dL (ref >39.00)
LDL CALC: 118 mg/dL — AB (ref 0–99)
NonHDL: 154
Total CHOL/HDL Ratio: 4
Triglycerides: 178 mg/dL — ABNORMAL HIGH (ref 0.0–149.0)
VLDL: 35.6 mg/dL (ref 0.0–40.0)

## 2017-04-05 LAB — TSH: TSH: 6.39 u[IU]/mL — AB (ref 0.35–4.50)

## 2017-04-05 MED ORDER — PRAVASTATIN SODIUM 40 MG PO TABS: 40.0000 mg | ORAL_TABLET | Freq: Every day | ORAL | 2 refills | Status: DC

## 2017-04-05 MED ORDER — METOPROLOL TARTRATE 50 MG PO TABS: 50.0000 mg | ORAL_TABLET | Freq: Two times a day (BID) | ORAL | 2 refills | Status: DC

## 2017-04-05 MED ORDER — LISINOPRIL-HYDROCHLOROTHIAZIDE 20-25 MG PO TABS: 1.0000 | ORAL_TABLET | Freq: Every day | ORAL | 2 refills | Status: DC

## 2017-04-05 MED ORDER — LEVOTHYROXINE SODIUM 137 MCG PO TABS: 137.0000 ug | ORAL_TABLET | Freq: Every day | ORAL | 2 refills | Status: DC

## 2017-04-05 MED ORDER — METFORMIN HCL ER 750 MG PO TB24: 750.0000 mg | ORAL_TABLET | Freq: Two times a day (BID) | ORAL | 2 refills | Status: DC

## 2017-04-05 MED ORDER — GLIMEPIRIDE 2 MG PO TABS: ORAL_TABLET | ORAL | 2 refills | Status: DC

## 2017-04-05 MED ORDER — AMLODIPINE BESYLATE 5 MG PO TABS: 5.0000 mg | ORAL_TABLET | Freq: Every day | ORAL | 1 refills | Status: DC

## 2017-04-05 NOTE — Progress Notes (Signed)
Pre visit review using our clinic review tool, if applicable. No additional management support is needed unless otherwise documented below in the visit note. 

## 2017-04-05 NOTE — Progress Notes (Signed)
Subjective:     Patient ID: Michaela Rhodes, female   DOB: Mar 17, 1936, 81 y.o.   MRN: [id number]  HPI Patient seen for medical follow-up. She's had some recent problems with ongoing back pain and had workup per orthopedics with compression fracture lumbar spine. She had multiple studies including MRI scan and this showed some abnormalities with her liver. She had subsequent CT abdomen and pelvis which showed multiple hepatic cysts but no acute findings or worrisome findings.  She has ongoing follow-up with orthopedics. She has multiple chronic problems including obesity, hypothyroidism, hyperlipidemia, hypertension, type 2 diabetes. History of very poor compliance. She has been poorly compliant with diet and medications is basically not taking any of her regular medications recently. Denies polyuria or polydipsia. Activities limited by her recent back pain.  Does not check sugars very often.  Past Medical History:  Diagnosis Date  . DM2 (diabetes mellitus, type 2) ([place])   . Hyperlipidemia   . Hypertension   . Hypothyroidism   . Panic attacks    mild  . SVT (supraventricular tachycardia) ([facility])    in the past  . Wears glasses    Past Surgical History:  Procedure Laterality Date  . ABDOMINAL HYSTERECTOMY     BSO as well  . APPENDECTOMY    . APPLICATION OF A-CELL OF EXTREMITY Left 01/15/2015   Procedure: APPLICATION OF A-CELL OF EXTREMITY;  Surgeon: Theodoro Kos, DO;  Location: [place];  Service: Plastics;  Laterality: Left;  . CHOLECYSTECTOMY    . I&D EXTREMITY Left 01/15/2015   Procedure: IRRIGATION AND DEBRIDEMENT EXTREMITY;  Surgeon: Theodoro Kos, DO;  Location: [place];  Service: Plastics;  Laterality: Left;  Marland Kitchen MASTECTOMY  1985   Left  . RECONSTRUCTION BREAST W/ LATISSIMUS DORSI FLAP    . TOTAL SHOULDER ARTHROPLASTY  05/27/2012   Procedure: TOTAL SHOULDER ARTHROPLASTY;  Surgeon: Johnny Bridge, MD;  Location: [place];  Service: Orthopedics;   Laterality: Right;    reports that she has never smoked. She has never used smokeless tobacco. She reports that she does not drink alcohol or use drugs. family history includes Heart disease in her mother; Hypertension in her father, mother, and sister; Stroke in her father. Allergies  Allergen Reactions  . Penicillins Anaphylaxis and Hives  . Keflex [Cephalexin]     Rash      Review of Systems  Eyes: Negative for visual disturbance.  Respiratory: Negative for cough, chest tightness, shortness of breath and wheezing.   Cardiovascular: Negative for chest pain, palpitations and leg swelling.  Gastrointestinal: Negative for abdominal pain.  Genitourinary: Negative for dysuria.  Musculoskeletal: Positive for back pain.  Neurological: Negative for dizziness, seizures, syncope, weakness, light-headedness and headaches.       Objective:   Physical Exam  Constitutional: She appears well-developed and well-nourished.  Eyes: Pupils are equal, round, and reactive to light.  Neck: Neck supple. No JVD present. No thyromegaly present.  Cardiovascular: Normal rate and regular rhythm.  Exam reveals no gallop.   Pulmonary/Chest: Effort normal and breath sounds normal. No respiratory distress. She has no wheezes. She has no rales.  Musculoskeletal: She exhibits no edema.  Neurological: She is alert.  Skin:  Feet reveal no skin lesions. Good distal foot pulses. Good capillary refill. No calluses. Normal sensation with monofilament testing        Assessment:     #1 type 2 diabetes poorly controlled with A1c today 8.3% but patient not taking medications recently  #2  hypothyroidism  #3 hypertension poorly controlled but patient currently not on medications  #4 dyslipidemia    Plan:     -Check follow-up labs today including lipid panel, hepatic panel, basic metabolic panel, TSH -Refill all medications and strongly emphasize getting back on them regularly -Routine follow-up in 3  months -Recommend tightness up diet and reduce sugars and starches and lose some weight -Continue close follow-up with orthopedics regarding her ongoing back pain  [name] MD [facility]

## 2017-04-05 NOTE — Patient Instructions (Signed)
Get back on all of your regular medications Try to lose some weight Scale back sugar and starches.  Set up 3 month follow up.

## 2017-04-06 DIAGNOSIS — S32000S Wedge compression fracture of unspecified lumbar vertebra, sequela: Secondary | ICD-10-CM | POA: Diagnosis not present

## 2017-04-06 DIAGNOSIS — M545 Low back pain: Secondary | ICD-10-CM | POA: Diagnosis not present

## 2017-04-21 ENCOUNTER — Other Ambulatory Visit (HOSPITAL_COMMUNITY): Payer: Self-pay | Admitting: Interventional Radiology

## 2017-04-21 DIAGNOSIS — IMO0001 Reserved for inherently not codable concepts without codable children: Secondary | ICD-10-CM

## 2017-04-21 DIAGNOSIS — M4850XA Collapsed vertebra, not elsewhere classified, site unspecified, initial encounter for fracture: Principal | ICD-10-CM

## 2017-04-27 ENCOUNTER — Other Ambulatory Visit: Payer: Self-pay | Admitting: General Surgery

## 2017-04-28 ENCOUNTER — Ambulatory Visit (HOSPITAL_COMMUNITY)
Admission: RE | Admit: 2017-04-28 | Discharge: 2017-04-28 | Disposition: A | Discharge status: Home / Self Care | Payer: Medicare HMO | Source: Ambulatory Visit | Attending: Interventional Radiology | Admitting: Interventional Radiology

## 2017-04-28 DIAGNOSIS — IMO0001 Reserved for inherently not codable concepts without codable children: Secondary | ICD-10-CM

## 2017-04-28 DIAGNOSIS — M4850XA Collapsed vertebra, not elsewhere classified, site unspecified, initial encounter for fracture: Principal | ICD-10-CM

## 2017-04-28 DIAGNOSIS — M4854XA Collapsed vertebra, not elsewhere classified, thoracic region, initial encounter for fracture: Secondary | ICD-10-CM | POA: Diagnosis not present

## 2017-04-28 HISTORY — PX: IR RADIOLOGIST EVAL & MGMT: IMG5224

## 2017-05-01 ENCOUNTER — Encounter (HOSPITAL_COMMUNITY): Payer: Self-pay | Admitting: Interventional Radiology

## 2017-05-02 ENCOUNTER — Telehealth (HOSPITAL_COMMUNITY): Payer: Self-pay

## 2017-05-02 NOTE — Telephone Encounter (Signed)
Called to schedule kp/vp, left message for pt to return call. AW

## 2017-05-03 ENCOUNTER — Telehealth (HOSPITAL_COMMUNITY): Payer: Self-pay

## 2017-05-03 NOTE — Telephone Encounter (Signed)
Called to schedule, left message for pt to return call. AW 

## 2017-05-05 ENCOUNTER — Other Ambulatory Visit (HOSPITAL_COMMUNITY): Payer: Self-pay | Admitting: Interventional Radiology

## 2017-05-05 DIAGNOSIS — M4850XA Collapsed vertebra, not elsewhere classified, site unspecified, initial encounter for fracture: Principal | ICD-10-CM

## 2017-05-05 DIAGNOSIS — IMO0001 Reserved for inherently not codable concepts without codable children: Secondary | ICD-10-CM

## 2017-05-08 ENCOUNTER — Other Ambulatory Visit: Payer: Self-pay | Admitting: Radiology

## 2017-05-09 ENCOUNTER — Other Ambulatory Visit: Payer: Self-pay | Admitting: Radiology

## 2017-05-09 ENCOUNTER — Ambulatory Visit (HOSPITAL_COMMUNITY): Admission: RE | Admit: 2017-05-09 | Payer: Medicare HMO | Source: Ambulatory Visit

## 2017-05-09 ENCOUNTER — Other Ambulatory Visit: Payer: Self-pay | Admitting: General Surgery

## 2017-05-10 ENCOUNTER — Other Ambulatory Visit (HOSPITAL_COMMUNITY): Payer: Self-pay | Admitting: Interventional Radiology

## 2017-05-10 ENCOUNTER — Ambulatory Visit (HOSPITAL_COMMUNITY)
Admission: RE | Admit: 2017-05-10 | Discharge: 2017-05-10 | Disposition: A | Discharge status: Home / Self Care | Payer: Medicare HMO | Source: Ambulatory Visit | Attending: Interventional Radiology | Admitting: Interventional Radiology

## 2017-05-10 ENCOUNTER — Encounter (HOSPITAL_COMMUNITY): Payer: Self-pay

## 2017-05-10 DIAGNOSIS — Y92009 Unspecified place in unspecified non-institutional (private) residence as the place of occurrence of the external cause: Secondary | ICD-10-CM | POA: Insufficient documentation

## 2017-05-10 DIAGNOSIS — F410 Panic disorder [episodic paroxysmal anxiety] without agoraphobia: Secondary | ICD-10-CM | POA: Diagnosis not present

## 2017-05-10 DIAGNOSIS — M8588 Other specified disorders of bone density and structure, other site: Secondary | ICD-10-CM | POA: Insufficient documentation

## 2017-05-10 DIAGNOSIS — W108XXA Fall (on) (from) other stairs and steps, initial encounter: Secondary | ICD-10-CM | POA: Diagnosis not present

## 2017-05-10 DIAGNOSIS — Z880 Allergy status to penicillin: Secondary | ICD-10-CM | POA: Diagnosis not present

## 2017-05-10 DIAGNOSIS — M4856XA Collapsed vertebra, not elsewhere classified, lumbar region, initial encounter for fracture: Secondary | ICD-10-CM | POA: Diagnosis not present

## 2017-05-10 DIAGNOSIS — E785 Hyperlipidemia, unspecified: Secondary | ICD-10-CM | POA: Diagnosis not present

## 2017-05-10 DIAGNOSIS — Z7984 Long term (current) use of oral hypoglycemic drugs: Secondary | ICD-10-CM | POA: Diagnosis not present

## 2017-05-10 DIAGNOSIS — M4850XA Collapsed vertebra, not elsewhere classified, site unspecified, initial encounter for fracture: Secondary | ICD-10-CM

## 2017-05-10 DIAGNOSIS — E039 Hypothyroidism, unspecified: Secondary | ICD-10-CM | POA: Insufficient documentation

## 2017-05-10 DIAGNOSIS — M545 Low back pain: Secondary | ICD-10-CM | POA: Diagnosis not present

## 2017-05-10 DIAGNOSIS — I10 Essential (primary) hypertension: Secondary | ICD-10-CM | POA: Insufficient documentation

## 2017-05-10 DIAGNOSIS — I471 Supraventricular tachycardia: Secondary | ICD-10-CM | POA: Diagnosis not present

## 2017-05-10 DIAGNOSIS — M4854XA Collapsed vertebra, not elsewhere classified, thoracic region, initial encounter for fracture: Secondary | ICD-10-CM | POA: Diagnosis not present

## 2017-05-10 DIAGNOSIS — S32049A Unspecified fracture of fourth lumbar vertebra, initial encounter for closed fracture: Secondary | ICD-10-CM | POA: Insufficient documentation

## 2017-05-10 DIAGNOSIS — Z8249 Family history of ischemic heart disease and other diseases of the circulatory system: Secondary | ICD-10-CM | POA: Insufficient documentation

## 2017-05-10 DIAGNOSIS — E119 Type 2 diabetes mellitus without complications: Secondary | ICD-10-CM | POA: Insufficient documentation

## 2017-05-10 DIAGNOSIS — S22089A Unspecified fracture of T11-T12 vertebra, initial encounter for closed fracture: Secondary | ICD-10-CM | POA: Diagnosis not present

## 2017-05-10 DIAGNOSIS — IMO0001 Reserved for inherently not codable concepts without codable children: Secondary | ICD-10-CM

## 2017-05-10 HISTORY — PX: IR KYPHO THORACIC WITH BONE BIOPSY: IMG5518

## 2017-05-10 HISTORY — PX: IR KYPHO EA ADDL LEVEL THORACIC OR LUMBAR: IMG5520

## 2017-05-10 LAB — BASIC METABOLIC PANEL
ANION GAP: 8 (ref 5–15)
BUN: 29 mg/dL — ABNORMAL HIGH (ref 6–20)
CALCIUM: 9.5 mg/dL (ref 8.9–10.3)
CO2: 25 mmol/L (ref 22–32)
Chloride: 104 mmol/L (ref 101–111)
Creatinine, Ser: 1.16 mg/dL — ABNORMAL HIGH (ref 0.44–1.00)
GFR, EST AFRICAN AMERICAN: 51 mL/min — AB (ref >60)
GFR, EST NON AFRICAN AMERICAN: 44 mL/min — AB (ref >60)
GLUCOSE: 152 mg/dL — AB (ref 65–99)
POTASSIUM: 3.7 mmol/L (ref 3.5–5.1)
Sodium: 137 mmol/L (ref 135–145)

## 2017-05-10 LAB — CBC
HEMATOCRIT: 40.8 % (ref 36.0–46.0)
HEMOGLOBIN: 13.5 g/dL (ref 12.0–15.0)
MCH: 28.1 pg (ref 26.0–34.0)
MCHC: 33.1 g/dL (ref 30.0–36.0)
MCV: 85 fL (ref 78.0–100.0)
Platelets: 265 10*3/uL (ref 150–400)
RBC: 4.8 MIL/uL (ref 3.87–5.11)
RDW: 13.2 % (ref 11.5–15.5)
WBC: 9.5 10*3/uL (ref 4.0–10.5)

## 2017-05-10 LAB — APTT: APTT: 28 s (ref 24–36)

## 2017-05-10 LAB — GLUCOSE, CAPILLARY
GLUCOSE-CAPILLARY: 149 mg/dL — AB (ref 65–99)
GLUCOSE-CAPILLARY: 104 mg/dL — AB (ref 65–99)

## 2017-05-10 LAB — PROTIME-INR
INR: 0.95
PROTHROMBIN TIME: 12.7 s (ref 11.4–15.2)

## 2017-05-10 MED ORDER — FENTANYL CITRATE (PF) 100 MCG/2ML IJ SOLN
INTRAMUSCULAR | Status: AC
  Filled 2017-05-10: qty 2

## 2017-05-10 MED ORDER — MIDAZOLAM HCL 2 MG/2ML IJ SOLN
INTRAMUSCULAR | Status: AC | PRN
  Administered 2017-05-10 (×3): 1 mg via INTRAVENOUS

## 2017-05-10 MED ORDER — FENTANYL CITRATE (PF) 100 MCG/2ML IJ SOLN
INTRAMUSCULAR | Status: AC | PRN
  Administered 2017-05-10 (×3): 25 ug via INTRAVENOUS

## 2017-05-10 MED ORDER — IOPAMIDOL (ISOVUE-300) INJECTION 61%
INTRAVENOUS | Status: AC
  Administered 2017-05-10: 5 mL
  Filled 2017-05-10: qty 50

## 2017-05-10 MED ORDER — MIDAZOLAM HCL 2 MG/2ML IJ SOLN
INTRAMUSCULAR | Status: AC
  Filled 2017-05-10: qty 2

## 2017-05-10 MED ORDER — VANCOMYCIN HCL IN DEXTROSE 1-5 GM/200ML-% IV SOLN
INTRAVENOUS | Status: AC
  Filled 2017-05-10: qty 200

## 2017-05-10 MED ORDER — BUPIVACAINE HCL (PF) 0.5 % IJ SOLN
INTRAMUSCULAR | Status: AC | PRN
  Administered 2017-05-10: 30 mL

## 2017-05-10 MED ORDER — BUPIVACAINE HCL (PF) 0.25 % IJ SOLN
INTRAMUSCULAR | Status: AC
  Administered 2017-05-10: 10 mL
  Filled 2017-05-10: qty 60

## 2017-05-10 MED ORDER — VANCOMYCIN HCL IN DEXTROSE 1-5 GM/200ML-% IV SOLN
1000.0000 mg | Freq: Once | INTRAVENOUS | Status: AC
  Administered 2017-05-10: 1000 mg via INTRAVENOUS

## 2017-05-10 MED ORDER — HYDROMORPHONE HCL 1 MG/ML IJ SOLN
INTRAMUSCULAR | Status: AC | PRN
  Administered 2017-05-10: 1 mg via INTRAVENOUS

## 2017-05-10 MED ORDER — SODIUM CHLORIDE 0.9 % IV SOLN: INTRAVENOUS | Status: AC

## 2017-05-10 MED ORDER — TOBRAMYCIN SULFATE 1.2 G IJ SOLR
INTRAMUSCULAR | Status: AC
  Filled 2017-05-10: qty 1.2

## 2017-05-10 MED ORDER — MIDAZOLAM HCL 2 MG/2ML IJ SOLN
INTRAMUSCULAR | Status: DC
Start: 2017-05-10 — End: 2017-05-11
  Filled 2017-05-10: qty 2

## 2017-05-10 MED ORDER — SODIUM CHLORIDE 0.9 % IV SOLN
INTRAVENOUS | Status: DC
Start: 2017-05-10 — End: 2017-05-11

## 2017-05-10 MED ORDER — HYDROMORPHONE HCL 1 MG/ML IJ SOLN
INTRAMUSCULAR | Status: AC
  Filled 2017-05-10: qty 1

## 2017-05-10 NOTE — Sedation Documentation (Signed)
Patient is resting comfortably. 

## 2017-05-10 NOTE — Discharge Instructions (Signed)
1.No stooping,bending or lifting motr than 10 lbs for 2 weeks. 2.Use walker to ambulate for 2 weeks. 3.RTC PRN 2 weeks KYPHOPLASTY/VERTEBROPLASTY DISCHARGE INSTRUCTIONS  Medications: (check all that apply)     Resume all home medications as before procedure.                     Continue your pain medications as prescribed as needed.  Over the next 3-5 days, decrease your pain medication as tolerated.  Over the counter medications (i.e. Tylenol, ibuprofen, and aleve) may be substituted once severe/moderate pain symptoms have subsided.   Wound Care: - Bandages may be removed the day following your procedure.  You may get your incision wet once bandages are removed.  Bandaids may be used to cover the incisions until scab formation.  Topical ointments are optional.  - If you develop a fever greater than 101 degrees, have increased skin redness at the incision sites or pus-like oozing from incisions occurring within 1 week of the procedure, contact radiology at [phone] or [phone].  - Ice pack to back for 15-20 minutes 2-3 time per day for first 2-3 days post procedure.  The ice will expedite muscle healing and help with the pain from the incisions.   Activity: - Bedrest today with limited activity for 24 hours post procedure.  - No driving for 48 hours.  - Increase your activity as tolerated after bedrest (with assistance if necessary).  - Refrain from any strenuous activity or heavy lifting (greater than 10 lbs.).   Follow up: - Contact radiology at [phone] or [phone] if any questions/concerns.  - A physician assistant from radiology will contact you in approximately 1 week.  - If a biopsy was performed at the time of your procedure, your referring physician should receive the results in usually 2-3 days.

## 2017-05-10 NOTE — H&P (Signed)
Chief Complaint: Patient was seen in consultation today for thoracic 12 ans Lumbar 4 vertebroplasty/kyphoplasty at the request of  Dr Suella Broad  Referring Physician(s): Dr Mickel Duhamel  Supervising Physician: Dr Luanne Bras  Patient Status: [place]  History of Present Illness: Michaela Rhodes is a 81 y.o. female   Pt lost her balance while coming down an staircase at home and fell into some wood pieces. This occurred 10/2016 Has had pain off and on since then Only recently pain worsened and sought medical evaluation Per Dr Estanislado Pandy note 04/28/17: (She subsequently underwent MRI scan of the thoracolumbar spine which revealed the presence of a T12 acute fracture, an L4 subacute compression fracture, and a nearly completely healed L1 fracture)  Now scheduled for T12 and L4 vertebroplasty/kyphoplasty  Hx Breast Cancer  Past Medical History:  Diagnosis Date  . DM2 (diabetes mellitus, type 2) ([place])   . Hyperlipidemia   . Hypertension   . Hypothyroidism   . Panic attacks    mild  . SVT (supraventricular tachycardia) ([facility])    in the past  . Wears glasses     Past Surgical History:  Procedure Laterality Date  . ABDOMINAL HYSTERECTOMY     BSO as well  . APPENDECTOMY    . APPLICATION OF A-CELL OF EXTREMITY Left 01/15/2015   Procedure: APPLICATION OF A-CELL OF EXTREMITY;  Surgeon: Theodoro Kos, DO;  Location: [facility];  Service: Plastics;  Laterality: Left;  . CHOLECYSTECTOMY    . I&D EXTREMITY Left 01/15/2015   Procedure: IRRIGATION AND DEBRIDEMENT EXTREMITY;  Surgeon: Theodoro Kos, DO;  Location: [facility];  Service: Plastics;  Laterality: Left;  . IR RADIOLOGIST EVAL & MGMT  04/28/2017  . MASTECTOMY  1985   Left  . RECONSTRUCTION BREAST W/ LATISSIMUS DORSI FLAP    . TOTAL SHOULDER ARTHROPLASTY  05/27/2012   Procedure: TOTAL SHOULDER ARTHROPLASTY;  Surgeon: Johnny Bridge, MD;  Location: [facility];  Service: Orthopedics;   Laterality: Right;    Allergies: Penicillins and Keflex [cephalexin]  Medications: Prior to Admission medications   Medication Sig Start Date End Date Taking? Authorizing Provider  amLODipine (NORVASC) 5 MG tablet Take 1 tablet (5 mg total) by mouth daily. 04/05/17  Yes Burchette, Alinda Sierras, MD  calcium-vitamin D (OSCAL WITH D) 500-200 MG-UNIT per tablet Take 1 tablet by mouth daily. 05/27/12  Yes Marchia Bond, MD  glimepiride (AMARYL) 2 MG tablet TAKE ONE TABLET BY MOUTH ONCE DAILY WITH  BREAKFAST 04/05/17  Yes Burchette, Alinda Sierras, MD  levothyroxine (SYNTHROID, LEVOTHROID) 137 MCG tablet Take 1 tablet (137 mcg total) by mouth daily before breakfast. 04/05/17  Yes Burchette, Alinda Sierras, MD  lisinopril-hydrochlorothiazide (PRINZIDE,ZESTORETIC) 20-25 MG tablet Take 1 tablet by mouth daily. 04/05/17  Yes Burchette, Alinda Sierras, MD  metFORMIN (GLUCOPHAGE XR) 750 MG 24 hr tablet Take 1 tablet (750 mg total) by mouth 2 (two) times daily at 10 AM and 5 PM. 04/05/17  Yes Burchette, Alinda Sierras, MD  metoprolol (LOPRESSOR) 50 MG tablet Take 1 tablet (50 mg total) by mouth 2 (two) times daily. 04/05/17  Yes Burchette, Alinda Sierras, MD  naproxen sodium (ANAPROX) 220 MG tablet Take 220 mg by mouth daily as needed (pain).   Yes [provider]  pravastatin (PRAVACHOL) 40 MG tablet Take 1 tablet (40 mg total) by mouth daily. 04/05/17  Yes Burchette, Alinda Sierras, MD     Family History  Problem Relation Age of Onset  . Heart  disease Mother   . Hypertension Mother   . Stroke Father   . Hypertension Father   . Coronary artery disease Unknown        family hx of  . Diabetes Unknown        family hx of  . Hypertension Unknown        family hx of  . Hypertension Sister     Social History   Social History  . Marital status: Widowed    Spouse name: N/A  . Number of children: N/A  . Years of education: N/A   Occupational History  . retired     had [employer] business   Social History Main Topics  . Smoking status:  Never Smoker  . Smokeless tobacco: Never Used  . Alcohol use No  . Drug use: No  . Sexual activity: No   Other Topics Concern  . None   Social History Narrative  . None    Review of Systems: A 12 point ROS discussed and pertinent positives are indicated in the HPI above.  All other systems are negative.  Review of Systems  Constitutional: Positive for activity change. Negative for appetite change, fatigue and fever.  Respiratory: Negative for cough and shortness of breath.   Cardiovascular: Negative for chest pain.  Gastrointestinal: Negative for abdominal pain.  Musculoskeletal: Positive for back pain. Negative for gait problem.  Neurological: Negative for weakness.  Psychiatric/Behavioral: Negative for behavioral problems and confusion.    Vital Signs: BP (!) 167/105   Pulse 97   Temp 97.8 F (36.6 C)   Resp 18   Ht 5\' 7"  (1.702 m)   Wt 220 lb (99.8 kg)   SpO2 96%   BMI 34.46 kg/m   Physical Exam  Constitutional: She is oriented to person, place, and time. She appears well-nourished.  HENT:  Head: Atraumatic.  Eyes: EOM are normal.  Neck: Neck supple.  Cardiovascular: Normal rate, regular rhythm and normal heart sounds.   Pulmonary/Chest: Effort normal and breath sounds normal.  Abdominal: Soft. Bowel sounds are normal. There is no tenderness.  Musculoskeletal: Normal range of motion.  Low back pain  Neurological: She is alert and oriented to person, place, and time.  Skin: Skin is warm and dry.  Psychiatric: She has a normal mood and affect. Her behavior is normal. Judgment and thought content normal.  Nursing note and vitals reviewed.   Mallampati Score:  MD Evaluation Airway: WNL Heart: WNL Abdomen: WNL Chest/ Lungs: WNL ASA  Classification: 3 Mallampati/Airway Score: One  Imaging: Ir Radiologist Eval & Mgmt  Result Date: 05/01/2017 EXAM: NEW PATIENT OFFICE VISIT CHIEF COMPLAINT: New onset of severe thoracolumbar pain secondary to compression  fractures at T12 and L4. Current Pain Level: 1-10 HISTORY OF PRESENT ILLNESS: The patient is a 81 year old right-handed lady who has been referred for evaluation for pain relief due to compression fractures at T12 and at L4. The patient reports starting experiencing low back pain sometime in late November of last year. This apparently, within the next 2 months, worsened to the point where she was unable to lay flat or get up from a sitting or lying position without experiencing severe excruciating pain in the low back region. She subsequently underwent MRI scan of the thoracolumbar spine which revealed the presence of a T12 acute fracture, an L4 subacute compression fracture, and a nearly completely healed L1 fracture. Over this time, the patient reports having lost height. Additionally she feels that she has impending sensation of  falling forward. The patient's present pain is a 9 out of 10 while lying down or while she is in the process of sitting up from a lying position or standing. The patient appears to have some relief by moving her arms forward to grasp objects such as a shopping cart. She denies any autonomic dysfunction her bowel or bladder activity. She states she does get mild relief by taking either Tylenol or Motrin. There is suggestion of a mild intermittent aching sensation radiating down her right leg to her toes which disappears completely when she stands up or sits. This is not a bothersome symptom for her. She lives by herself in-house with steps. The patient so far has been able to manage daily routine activities on her own with significant difficulty though because of the pain. She denies recent chills, fever or rigors. She denies any UTI symptoms of dysuria, hematuria or polyuria. She denies any chest pain, shortness of breath, palpitations, wheezing, coughing or of difficulty swallowing, constipation, diarrhea or passing of bloody stools. Past Medical History: Acute right hip pain. Primary  osteoarthritis of the right hip. Lumbar spine pain. Trigger finger. History of carcinoma the left breast treated with mastectomy in the 1990s. Diagnosed with diabetes mellitus although unsure of blood sugar levels. High blood pressure. Surgical History: Appendectomy, cholecystectomy and as mentioned above breast carcinoma. Medications: Levothyroxine, pravastatin, metoprolol, metformin, lisinopril/hydrochlorothiazide combination, glimepiride and amlodipine. Allergies: Penicillin and Keflex both which cause generalized rash with itching. Social History: Widowed and lives by herself. Has 3 children alive and well. Denies smoking cigarettes or drinking alcohol or or using illicit chemicals. Family History: Mother deceased age 32 in a car accident. Father deceased age 37 of old age. History of migraine headaches and high blood pressure in the family. REVIEW OF SYSTEMS: Negative unless as mentioned above. PHYSICAL EXAMINATION: Appears to be in mild distress on account of the pain with a forward slouched posture. Affect appropriate for her condition. Neurologically intact without lateralizing cranial nerve, motor, sensory, coordination or speech difficulties. Station and gait - patient walks slouched forward without an aid. Vague tenderness in the thoracolumbar region. ASSESSMENT AND PLAN: The patient recent's MRI scan of the lumbosacral spine was reviewed. This demonstrates recent T12 compression fracture with mild-to-moderate anterior wedge deformity with mild retropulsion. Nearly completely healed compression fracture at L1 with the moderate protrusion. Also demonstrated is an L4 subacute compression fracture with with minimal retropulsion. The option of vertebral body augmentation for pain relief, and also to prevent further collapse of the involved fractures was reviewed in detail. The procedure, the risks, benefits and alternatives were all reviewed. Questions were answered to her satisfaction. The patient would like  to proceed with vertebral body augmentation at T12 and L4. Since the patient has a past history of breast carcinoma, the question of biopsy was raised with the patient. She would like to go ahead and perform biopsy at the time of the treatment. Dr. Nelva Bush will also be informed of the above for his approval. The treatment will be scheduled as soon as possible. In the meantime, the patient was then asked to refrain from stooping, bending or lifting anything above 10 pounds and to use a walker as much as possible. Questions were answered to her satisfaction. The patient leaves with good understanding and agreement with the above management plan. Electronically Signed   By: Luanne Bras M.D.   On: 04/28/2017 12:37    Labs:  CBC:  Recent Labs  05/10/17 0944  WBC 9.5  HGB 13.5  HCT 40.8  PLT 265    COAGS:  Recent Labs  05/10/17 0944  INR 0.95  APTT 28    BMP:  Recent Labs  04/05/17 0919 05/10/17 0944  NA 141 137  K 4.9 3.7  CL 106 104  CO2 27 25  GLUCOSE 219* 152*  BUN 32* 29*  CALCIUM 9.7 9.5  CREATININE 1.09 1.16*  GFRNONAA  --  44*  GFRAA  --  51*    LIVER FUNCTION TESTS:  Recent Labs  04/05/17 0919  BILITOT 0.4  AST 13  ALT 13  ALKPHOS 145*  PROT 6.9  ALBUMIN 4.1    TUMOR MARKERS: No results for input(s): AFPTM, CEA, CA199, CHROMGRNA in the last 8760 hours.  Assessment and Plan:  Acute painful fractures of Thoracic 12 and Lumbar 4 Now scheduled for vertebroplasty/kyphoplasty Risks and Benefits discussed with the patient including, but not limited to education regarding the natural healing process of compression fractures without intervention, bleeding, infection, cement migration which may cause spinal cord damage, paralysis, pulmonary embolism or even death. All of the patient's questions were answered, patient is agreeable to proceed. Consent signed and in chart.  Thank you for this interesting consult.  I greatly enjoyed meeting Michaela Rhodes  and look forward to participating in their care.  A copy of this report was sent to the requesting provider on this date.  Electronically Signed: Lavonia Drafts, PA-C 05/10/2017, 10:58 AM   I spent a total of  30 Minutes   in face to face in clinical consultation, greater than 50% of which was counseling/coordinating care for T12 and L4 VP/KP

## 2017-05-10 NOTE — Sedation Documentation (Signed)
Procedure pain

## 2017-05-10 NOTE — Sedation Documentation (Signed)
Patient denies pain and is resting comfortably.  

## 2017-05-10 NOTE — Procedures (Signed)
S/P T 12 and L4 balloon KP .

## 2017-05-10 NOTE — Sedation Documentation (Signed)
Assumed care of pt

## 2017-05-17 ENCOUNTER — Telehealth (HOSPITAL_COMMUNITY): Payer: Self-pay

## 2017-05-17 ENCOUNTER — Encounter (HOSPITAL_COMMUNITY): Payer: Self-pay | Admitting: Interventional Radiology

## 2017-05-17 NOTE — Telephone Encounter (Signed)
Called to schedule f/u, left message for pt to return call. AW 

## 2017-05-22 ENCOUNTER — Telehealth (HOSPITAL_COMMUNITY): Payer: Self-pay

## 2017-05-22 NOTE — Telephone Encounter (Signed)
Called to schedule, left message for pt to return call. AW 

## 2017-05-23 ENCOUNTER — Telehealth (HOSPITAL_COMMUNITY): Payer: Self-pay

## 2017-05-23 NOTE — Telephone Encounter (Signed)
Called to schedule f/u. Pt stated that she thinks that she is just having some muscle soreness. She would like to give it a little more time and see how she feels. She will call if things change. AW

## 2017-07-05 ENCOUNTER — Ambulatory Visit: Payer: Commercial Managed Care - HMO | Admitting: Family Medicine

## 2017-07-05 DIAGNOSIS — Z0289 Encounter for other administrative examinations: Secondary | ICD-10-CM

## 2017-07-10 ENCOUNTER — Observation Stay (HOSPITAL_COMMUNITY): Payer: Medicare HMO

## 2017-07-10 ENCOUNTER — Encounter (HOSPITAL_COMMUNITY): Payer: Self-pay | Admitting: Emergency Medicine

## 2017-07-10 ENCOUNTER — Inpatient Hospital Stay (HOSPITAL_COMMUNITY)
Admission: EM | Admit: 2017-07-10 | Discharge: 2017-07-12 | DRG: 871 | Disposition: A | Discharge status: Home / Self Care | Payer: Medicare HMO | Attending: Internal Medicine | Admitting: Internal Medicine

## 2017-07-10 ENCOUNTER — Emergency Department (HOSPITAL_COMMUNITY): Payer: Medicare HMO

## 2017-07-10 DIAGNOSIS — E119 Type 2 diabetes mellitus without complications: Secondary | ICD-10-CM | POA: Diagnosis present

## 2017-07-10 DIAGNOSIS — I5031 Acute diastolic (congestive) heart failure: Secondary | ICD-10-CM

## 2017-07-10 DIAGNOSIS — E039 Hypothyroidism, unspecified: Secondary | ICD-10-CM | POA: Diagnosis not present

## 2017-07-10 DIAGNOSIS — Z96611 Presence of right artificial shoulder joint: Secondary | ICD-10-CM | POA: Diagnosis present

## 2017-07-10 DIAGNOSIS — A419 Sepsis, unspecified organism: Principal | ICD-10-CM

## 2017-07-10 DIAGNOSIS — Z86711 Personal history of pulmonary embolism: Secondary | ICD-10-CM

## 2017-07-10 DIAGNOSIS — I471 Supraventricular tachycardia, unspecified: Secondary | ICD-10-CM

## 2017-07-10 DIAGNOSIS — Z9889 Other specified postprocedural states: Secondary | ICD-10-CM

## 2017-07-10 DIAGNOSIS — Z86718 Personal history of other venous thrombosis and embolism: Secondary | ICD-10-CM

## 2017-07-10 DIAGNOSIS — E872 Acidosis, unspecified: Secondary | ICD-10-CM

## 2017-07-10 DIAGNOSIS — J189 Pneumonia, unspecified organism: Secondary | ICD-10-CM

## 2017-07-10 DIAGNOSIS — E860 Dehydration: Secondary | ICD-10-CM | POA: Diagnosis present

## 2017-07-10 DIAGNOSIS — J181 Lobar pneumonia, unspecified organism: Secondary | ICD-10-CM

## 2017-07-10 DIAGNOSIS — J90 Pleural effusion, not elsewhere classified: Secondary | ICD-10-CM | POA: Diagnosis not present

## 2017-07-10 DIAGNOSIS — I10 Essential (primary) hypertension: Secondary | ICD-10-CM | POA: Diagnosis not present

## 2017-07-10 DIAGNOSIS — Z8249 Family history of ischemic heart disease and other diseases of the circulatory system: Secondary | ICD-10-CM

## 2017-07-10 DIAGNOSIS — E118 Type 2 diabetes mellitus with unspecified complications: Secondary | ICD-10-CM

## 2017-07-10 DIAGNOSIS — J810 Acute pulmonary edema: Secondary | ICD-10-CM | POA: Diagnosis not present

## 2017-07-10 DIAGNOSIS — Z9012 Acquired absence of left breast and nipple: Secondary | ICD-10-CM | POA: Diagnosis not present

## 2017-07-10 DIAGNOSIS — J9811 Atelectasis: Secondary | ICD-10-CM | POA: Diagnosis not present

## 2017-07-10 DIAGNOSIS — R0902 Hypoxemia: Secondary | ICD-10-CM | POA: Diagnosis not present

## 2017-07-10 DIAGNOSIS — J9601 Acute respiratory failure with hypoxia: Secondary | ICD-10-CM | POA: Diagnosis not present

## 2017-07-10 DIAGNOSIS — Z79899 Other long term (current) drug therapy: Secondary | ICD-10-CM

## 2017-07-10 DIAGNOSIS — I313 Pericardial effusion (noninflammatory): Secondary | ICD-10-CM | POA: Diagnosis not present

## 2017-07-10 DIAGNOSIS — R0602 Shortness of breath: Secondary | ICD-10-CM | POA: Diagnosis not present

## 2017-07-10 DIAGNOSIS — I959 Hypotension, unspecified: Secondary | ICD-10-CM | POA: Diagnosis present

## 2017-07-10 DIAGNOSIS — Z853 Personal history of malignant neoplasm of breast: Secondary | ICD-10-CM | POA: Diagnosis not present

## 2017-07-10 DIAGNOSIS — J948 Other specified pleural conditions: Secondary | ICD-10-CM | POA: Diagnosis not present

## 2017-07-10 DIAGNOSIS — Z90710 Acquired absence of both cervix and uterus: Secondary | ICD-10-CM | POA: Diagnosis not present

## 2017-07-10 DIAGNOSIS — Z881 Allergy status to other antibiotic agents status: Secondary | ICD-10-CM | POA: Diagnosis not present

## 2017-07-10 DIAGNOSIS — R0789 Other chest pain: Secondary | ICD-10-CM | POA: Diagnosis not present

## 2017-07-10 DIAGNOSIS — I110 Hypertensive heart disease with heart failure: Secondary | ICD-10-CM | POA: Diagnosis present

## 2017-07-10 DIAGNOSIS — R05 Cough: Secondary | ICD-10-CM | POA: Diagnosis not present

## 2017-07-10 DIAGNOSIS — E785 Hyperlipidemia, unspecified: Secondary | ICD-10-CM | POA: Diagnosis present

## 2017-07-10 DIAGNOSIS — R6520 Severe sepsis without septic shock: Secondary | ICD-10-CM | POA: Diagnosis present

## 2017-07-10 DIAGNOSIS — Z880 Allergy status to penicillin: Secondary | ICD-10-CM

## 2017-07-10 DIAGNOSIS — Z7984 Long term (current) use of oral hypoglycemic drugs: Secondary | ICD-10-CM

## 2017-07-10 DIAGNOSIS — E1065 Type 1 diabetes mellitus with hyperglycemia: Secondary | ICD-10-CM | POA: Diagnosis not present

## 2017-07-10 DIAGNOSIS — E162 Hypoglycemia, unspecified: Secondary | ICD-10-CM | POA: Diagnosis not present

## 2017-07-10 DIAGNOSIS — R079 Chest pain, unspecified: Secondary | ICD-10-CM

## 2017-07-10 DIAGNOSIS — I5043 Acute on chronic combined systolic (congestive) and diastolic (congestive) heart failure: Secondary | ICD-10-CM | POA: Diagnosis present

## 2017-07-10 DIAGNOSIS — N179 Acute kidney failure, unspecified: Secondary | ICD-10-CM | POA: Diagnosis present

## 2017-07-10 DIAGNOSIS — J9600 Acute respiratory failure, unspecified whether with hypoxia or hypercapnia: Secondary | ICD-10-CM | POA: Diagnosis present

## 2017-07-10 LAB — I-STAT VENOUS BLOOD GAS, ED
ACID-BASE DEFICIT: 4 mmol/L — AB (ref 0.0–2.0)
Bicarbonate: 19.4 mmol/L — ABNORMAL LOW (ref 20.0–28.0)
O2 SAT: 98 %
TCO2: 20 mmol/L (ref 0–100)
pCO2, Ven: 31.3 mmHg — ABNORMAL LOW (ref 44.0–60.0)
pH, Ven: 7.4 (ref 7.250–7.430)
pO2, Ven: 101 mmHg — ABNORMAL HIGH (ref 32.0–45.0)

## 2017-07-10 LAB — I-STAT CHEM 8, ED
BUN: 34 mg/dL — ABNORMAL HIGH (ref 6–20)
CHLORIDE: 102 mmol/L (ref 101–111)
Calcium, Ion: 1.06 mmol/L — ABNORMAL LOW (ref 1.15–1.40)
Creatinine, Ser: 1 mg/dL (ref 0.44–1.00)
GLUCOSE: 241 mg/dL — AB (ref 65–99)
HCT: 34 % — ABNORMAL LOW (ref 36.0–46.0)
Hemoglobin: 11.6 g/dL — ABNORMAL LOW (ref 12.0–15.0)
POTASSIUM: 4.3 mmol/L (ref 3.5–5.1)
SODIUM: 137 mmol/L (ref 135–145)
TCO2: 23 mmol/L (ref 0–100)

## 2017-07-10 LAB — I-STAT TROPONIN, ED
TROPONIN I, POC: 0 ng/mL (ref 0.00–0.08)
Troponin i, poc: 0.01 ng/mL (ref 0.00–0.08)

## 2017-07-10 LAB — CBC WITH DIFFERENTIAL/PLATELET
BASOS PCT: 0 %
Basophils Absolute: 0 10*3/uL (ref 0.0–0.1)
EOS ABS: 0 10*3/uL (ref 0.0–0.7)
Eosinophils Relative: 0 %
HEMATOCRIT: 39.3 % (ref 36.0–46.0)
HEMOGLOBIN: 13 g/dL (ref 12.0–15.0)
LYMPHS PCT: 9 %
Lymphs Abs: 1.5 10*3/uL (ref 0.7–4.0)
MCH: 27.7 pg (ref 26.0–34.0)
MCHC: 33.1 g/dL (ref 30.0–36.0)
MCV: 83.6 fL (ref 78.0–100.0)
Monocytes Absolute: 1.1 10*3/uL — ABNORMAL HIGH (ref 0.1–1.0)
Monocytes Relative: 7 %
NEUTROS ABS: 13.6 10*3/uL — AB (ref 1.7–7.7)
Neutrophils Relative %: 84 %
RBC: 4.7 MIL/uL (ref 3.87–5.11)
RDW: 13.5 % (ref 11.5–15.5)
WBC: 16.2 10*3/uL — ABNORMAL HIGH (ref 4.0–10.5)

## 2017-07-10 LAB — LACTIC ACID, PLASMA: Lactic Acid, Venous: 2 mmol/L (ref 0.5–1.9)

## 2017-07-10 LAB — I-STAT CG4 LACTIC ACID, ED: Lactic Acid, Venous: 2.7 mmol/L (ref 0.5–1.9)

## 2017-07-10 LAB — GLUCOSE, CAPILLARY
GLUCOSE-CAPILLARY: 324 mg/dL — AB (ref 65–99)
Glucose-Capillary: 266 mg/dL — ABNORMAL HIGH (ref 65–99)

## 2017-07-10 LAB — TSH: TSH: 3.726 u[IU]/mL (ref 0.350–4.500)

## 2017-07-10 LAB — TROPONIN I: Troponin I: <0.03 ng/mL (ref <0.03)

## 2017-07-10 MED ORDER — ASPIRIN 81 MG PO CHEW
CHEWABLE_TABLET | ORAL | Status: AC
  Administered 2017-07-10: 324 mg
  Filled 2017-07-10: qty 4

## 2017-07-10 MED ORDER — FUROSEMIDE 10 MG/ML IJ SOLN
40.0000 mg | Freq: Once | INTRAMUSCULAR | Status: AC
  Administered 2017-07-10: 40 mg via INTRAVENOUS
  Filled 2017-07-10: qty 4

## 2017-07-10 MED ORDER — SODIUM CHLORIDE 0.9 % IV BOLUS (SEPSIS)
500.0000 mL | Freq: Once | INTRAVENOUS | Status: AC
  Administered 2017-07-10: 500 mL via INTRAVENOUS

## 2017-07-10 MED ORDER — INSULIN ASPART 100 UNIT/ML ~~LOC~~ SOLN
0.0000 [IU] | Freq: Three times a day (TID) | SUBCUTANEOUS | Status: DC
  Administered 2017-07-11 – 2017-07-12 (×2): 2 [IU] via SUBCUTANEOUS
  Administered 2017-07-12: 3 [IU] via SUBCUTANEOUS

## 2017-07-10 MED ORDER — VANCOMYCIN HCL 10 G IV SOLR
2000.0000 mg | Freq: Once | INTRAVENOUS | Status: AC
  Administered 2017-07-10: 2000 mg via INTRAVENOUS
  Filled 2017-07-10: qty 2000

## 2017-07-10 MED ORDER — VANCOMYCIN HCL IN DEXTROSE 750-5 MG/150ML-% IV SOLN
750.0000 mg | Freq: Two times a day (BID) | INTRAVENOUS | Status: DC
  Filled 2017-07-10: qty 150

## 2017-07-10 MED ORDER — HEPARIN (PORCINE) IN NACL 100-0.45 UNIT/ML-% IJ SOLN
1400.0000 [IU]/h | INTRAMUSCULAR | Status: DC
  Administered 2017-07-10: 1400 [IU]/h via INTRAVENOUS
  Filled 2017-07-10: qty 250

## 2017-07-10 MED ORDER — LEVOFLOXACIN IN D5W 750 MG/150ML IV SOLN
750.0000 mg | Freq: Once | INTRAVENOUS | Status: AC
  Administered 2017-07-10: 750 mg via INTRAVENOUS
  Filled 2017-07-10: qty 150

## 2017-07-10 MED ORDER — ADENOSINE 6 MG/2ML IV SOLN
6.0000 mg | Freq: Once | INTRAVENOUS | Status: AC
  Administered 2017-07-10: 6 mg via INTRAVENOUS

## 2017-07-10 MED ORDER — LEVOTHYROXINE SODIUM 25 MCG PO TABS
137.0000 ug | ORAL_TABLET | Freq: Every day | ORAL | Status: DC
  Administered 2017-07-11 – 2017-07-12 (×2): 137 ug via ORAL
  Filled 2017-07-10 (×2): qty 1

## 2017-07-10 MED ORDER — LEVOFLOXACIN IN D5W 750 MG/150ML IV SOLN: 750.0000 mg | INTRAVENOUS | Status: DC

## 2017-07-10 MED ORDER — INSULIN ASPART 100 UNIT/ML ~~LOC~~ SOLN
0.0000 [IU] | Freq: Every day | SUBCUTANEOUS | Status: DC
  Administered 2017-07-10: 4 [IU] via SUBCUTANEOUS

## 2017-07-10 MED ORDER — IOPAMIDOL (ISOVUE-370) INJECTION 76%
INTRAVENOUS | Status: AC
  Administered 2017-07-10: 100 mL
  Filled 2017-07-10: qty 100

## 2017-07-10 MED ORDER — ASPIRIN 81 MG PO CHEW
243.0000 mg | CHEWABLE_TABLET | Freq: Once | ORAL | Status: AC
  Administered 2017-07-10: 243 mg via ORAL

## 2017-07-10 MED ORDER — ASPIRIN 325 MG PO TABS: 325.0000 mg | ORAL_TABLET | Freq: Every day | ORAL | Status: DC

## 2017-07-10 MED ORDER — METOPROLOL TARTRATE 25 MG PO TABS
50.0000 mg | ORAL_TABLET | Freq: Once | ORAL | Status: AC
  Administered 2017-07-10: 50 mg via ORAL
  Filled 2017-07-10: qty 2

## 2017-07-10 MED ORDER — POTASSIUM CHLORIDE CRYS ER 20 MEQ PO TBCR
40.0000 meq | EXTENDED_RELEASE_TABLET | Freq: Once | ORAL | Status: AC
  Administered 2017-07-10: 40 meq via ORAL
  Filled 2017-07-10: qty 2

## 2017-07-10 MED ORDER — METOPROLOL TARTRATE 5 MG/5ML IV SOLN: 2.5000 mg | INTRAVENOUS | Status: DC | PRN

## 2017-07-10 MED ORDER — HEPARIN BOLUS VIA INFUSION
5000.0000 [IU] | Freq: Once | INTRAVENOUS | Status: AC
  Administered 2017-07-10: 5000 [IU] via INTRAVENOUS
  Filled 2017-07-10: qty 5000

## 2017-07-10 MED ORDER — SODIUM CHLORIDE 0.9 % IV SOLN: INTRAVENOUS | Status: DC

## 2017-07-10 MED ORDER — PRAVASTATIN SODIUM 40 MG PO TABS
40.0000 mg | ORAL_TABLET | Freq: Every day | ORAL | Status: DC
  Administered 2017-07-11 – 2017-07-12 (×2): 40 mg via ORAL
  Filled 2017-07-10 (×2): qty 1

## 2017-07-10 NOTE — Progress Notes (Signed)
[facility] for heparin Indication: r/o PE  Allergies  Allergen Reactions  . Penicillins Anaphylaxis and Hives    Has patient had a PCN reaction causing immediate rash, facial/tongue/throat swelling, SOB or lightheadedness with hypotension: Yes Has patient had a PCN reaction causing severe rash involving mucus membranes or skin necrosis: Yes Has patient had a PCN reaction that required hospitalization: No Has patient had a PCN reaction occurring within the last 10 years: Yes If all of the above answers are "NO", then may proceed with Cephalosporin use.   Marland Kitchen Keflex [Cephalexin]     Rash     Patient Measurements: Height: 5\' 7"  (170.2 cm) Weight: 216 lb 0.8 oz (98 kg) IBW/kg (Calculated) : 61.6 Heparin Dosing Weight: 83 kg  Vital Signs: BP: 77/65 (07/23 1545) Pulse Rate: 97 (07/23 1543)  Labs:  Recent Labs  07/10/17 1239 07/10/17 1459  HGB 11.6* 13.0  HCT 34.0* 39.3  PLT  --  PENDING  CREATININE 1.00  --     Estimated Creatinine Clearance: 54.9 mL/min (by C-G formula based on SCr of 1 mg/dL).   Medical History: Past Medical History:  Diagnosis Date  . DM2 (diabetes mellitus, type 2) ([place])   . Hyperlipidemia   . Hypertension   . Hypothyroidism   . Panic attacks    mild  . SVT (supraventricular tachycardia) ([facility])    in the past  . Wears glasses     Assessment: 81 yo female admitted with SOB and to start heparin infusion due to concern for PE. Hgb stable, PLTc pending and no known anticoagulation prior to arrival.   Goal of Therapy:  Heparin level 0.3-0.7 units/ml Monitor platelets by anticoagulation protocol: Yes   Plan:  1. Give 5000 units bolus x 1 2. Start heparin infusion at 1400 units/hr 3. Check anti-Xa level in 8 hours and daily while on heparin 4. Continue to monitor H&H and platelets  Vincenza Hews, PharmD, BCPS 07/10/2017, 3:58 PM

## 2017-07-10 NOTE — ED Triage Notes (Signed)
Patient from home alone with [facility] for shortness of breath that started yesterday.  On arrival to scene EMS found patient to be in SVT with rate 175.  Patient states she has had a chest cold for a month.  Patient denies pain and weakness.  Alert and oriented and in no apparent distress at this time.

## 2017-07-10 NOTE — ED Provider Notes (Signed)
Patient has become more dyspneic and heart rate is back up in 140s. Dr. Barbaraann Faster at bedside. Repeat EKG done and heart rate is up to 140. Unable to evaluate rhythm on repeat EKG due poor data quality.   abg drawna po2 102 on nrb Patient having ct angio done now.   Pattricia Boss, MD 07/10/17 [phone]

## 2017-07-10 NOTE — Consult Note (Signed)
Name: Michaela Rhodes MRN: [id number] DOB: September 27, 1936    ADMISSION DATE:  07/10/2017 CONSULTATION DATE:  07/10/17  REFERRING MD :  07/10/17  CHIEF COMPLAINT:  SOB   HISTORY OF PRESENT ILLNESS:  Michaela Rhodes is a 81 y.o. female with a PMH as outlined below.  She presented to [facility] ED 7/23 with SOB that began 3 days prior.  She had also had chest, nasal congestion, and cough for 1 week. On 7/23, EMS was called to her home and upon their arrival, she was found to be in SVT with rate of 175.  In ED, CXR demonstrated infiltrate at LLL and lingula.  CTA was negative for PE but did demonstrate LLL consolidation with air bronchograms, moderate to large left sided effusion, small right effusion, small to mod pericardial effsuion, subcarinal adenopathy.  Pt was admitted by [facility] and PCCM was asked to see in consultation for pleural effusions.  While in ED, pt had chills and persistent tachycardia.  She received heparin for concern of PE which was then stopped after CTA chest completed.  She has had no fevers/chills/sweats, chest pain, N/V/D, abd pain, myalgias, exposures to known sick contacts, recent travel.  No hospitalizations or health care exposures for several years.   PAST MEDICAL HISTORY :   has a past medical history of DM2 (diabetes mellitus, type 2) ([place]); Hyperlipidemia; Hypertension; Hypothyroidism; Panic attacks; SVT (supraventricular tachycardia) ([place]); and Wears glasses.  has a past surgical history that includes Appendectomy; Cholecystectomy; Abdominal hysterectomy; Reconstruction breast w/ latissimus dorsi flap; Total shoulder arthroplasty (05/27/2012); Mastectomy (1985); I&D extremity (Left, 01/15/2015); Application of a-cell of extremity (Left, 01/15/2015); IR Radiologist Eval & Mgmt (04/28/2017); IR KYPHO THORACIC WITH BONE BIOPSY (05/10/2017); and IR KYPHO EA ADDL LEVEL THORACIC OR LUMBAR (05/10/2017). Prior to Admission medications   Medication Sig Start Date End Date Taking? Authorizing  Provider  amLODipine (NORVASC) 5 MG tablet Take 1 tablet (5 mg total) by mouth daily. Patient taking differently: Take 5 mg by mouth 2 (two) times daily.  04/05/17  Yes Burchette, Alinda Sierras, MD  calcium-vitamin D (OSCAL WITH D) 500-200 MG-UNIT per tablet Take 1 tablet by mouth daily. 05/27/12  Yes Marchia Bond, MD  glimepiride (AMARYL) 2 MG tablet TAKE ONE TABLET BY MOUTH ONCE DAILY WITH  BREAKFAST Patient taking differently: Take 2 mg by mouth daily with breakfast.  04/05/17  Yes Burchette, Alinda Sierras, MD  ibuprofen (ADVIL,MOTRIN) 200 MG tablet Take 200 mg by mouth every 6 (six) hours as needed for mild pain.   Yes [provider]  levothyroxine (SYNTHROID, LEVOTHROID) 137 MCG tablet Take 1 tablet (137 mcg total) by mouth daily before breakfast. 04/05/17  Yes Burchette, Alinda Sierras, MD  lisinopril-hydrochlorothiazide (PRINZIDE,ZESTORETIC) 20-25 MG tablet Take 1 tablet by mouth daily. 04/05/17  Yes Burchette, Alinda Sierras, MD  metFORMIN (GLUCOPHAGE XR) 750 MG 24 hr tablet Take 1 tablet (750 mg total) by mouth 2 (two) times daily at 10 AM and 5 PM. 04/05/17  Yes Burchette, Alinda Sierras, MD  metoprolol (LOPRESSOR) 50 MG tablet Take 1 tablet (50 mg total) by mouth 2 (two) times daily. 04/05/17  Yes Burchette, Alinda Sierras, MD  naproxen sodium (ANAPROX) 220 MG tablet Take 220 mg by mouth daily.    Yes [provider]  pravastatin (PRAVACHOL) 40 MG tablet Take 1 tablet (40 mg total) by mouth daily. 04/05/17  Yes Burchette, Alinda Sierras, MD   Allergies  Allergen Reactions  . Penicillins Anaphylaxis and Hives    Has patient had  a PCN reaction causing immediate rash, facial/tongue/throat swelling, SOB or lightheadedness with hypotension: Yes Has patient had a PCN reaction causing severe rash involving mucus membranes or skin necrosis: Yes Has patient had a PCN reaction that required hospitalization: No Has patient had a PCN reaction occurring within the last 10 years: Yes If all of the above answers are "NO", then may  proceed with Cephalosporin use.   Marland Kitchen Keflex [Cephalexin]     Rash     FAMILY HISTORY:  family history includes Coronary artery disease in her unknown relative; Diabetes in her unknown relative; Heart disease in her mother; Hypertension in her father, mother, sister, and unknown relative; Stroke in her father. SOCIAL HISTORY:  reports that she has never smoked. She has never used smokeless tobacco. She reports that she does not drink alcohol or use drugs.  REVIEW OF SYSTEMS:   All negative; except for those that are bolded, which indicate positives.  Constitutional: weight loss, weight gain, night sweats, fevers, chills, fatigue, weakness.  HEENT: headaches, sore throat, sneezing, nasal congestion, post nasal drip, difficulty swallowing, tooth/dental problems, visual complaints, visual changes, ear aches. Neuro: difficulty with speech, weakness, numbness, ataxia. CV:  chest pain, orthopnea, PND, swelling in lower extremities, dizziness, palpitations, syncope.  Resp: cough, hemoptysis, dyspnea, wheezing. GI: heartburn, indigestion, abdominal pain, nausea, vomiting, diarrhea, constipation, change in bowel habits, loss of appetite, hematemesis, melena, hematochezia.  GU: dysuria, change in color of urine, urgency or frequency, flank pain, hematuria. MSK: joint pain or swelling, decreased range of motion. Psych: change in mood or affect, depression, anxiety, suicidal ideations, homicidal ideations. Skin: rash, itching, bruising.   SUBJECTIVE: Breathing comfortably now.  No chest pain.  VITAL SIGNS: Pulse Rate:  [84-168] 139 (07/23 1750) Resp:  [14-36] 23 (07/23 1750) BP: (77-146)/(53-97) 106/68 (07/23 1750) SpO2:  [81 %-100 %] 100 % (07/23 1750) Weight:  [98 kg (216 lb 0.8 oz)] 98 kg (216 lb 0.8 oz) (07/23 1300)  PHYSICAL EXAMINATION: General: Adult female, resting in bed, in NAD. Neuro: A&O x 3, non-focal.  HEENT: /AT. PERRL, sclerae anicteric. Cardiovascular: RRR, no M/R/G.   Lungs: Respirations even and unlabored.  Diminished in bases, coarse on left. Abdomen: BS x 4, soft, NT/ND.  Musculoskeletal: No gross deformities, no edema.  Skin: Intact, warm, no rashes.    Recent Labs Lab 07/10/17 1239  NA 137  K 4.3  CL 102  BUN 34*  CREATININE 1.00  GLUCOSE 241*    Recent Labs Lab 07/10/17 1239 07/10/17 1459  HGB 11.6* 13.0  HCT 34.0* 39.3  WBC  --  16.2*  PLT  --  PLATELET CLUMPS NOTED ON SMEAR, COUNT APPEARS INCREASED   Ct Angio Chest Pe W Or Wo Contrast  Result Date: 07/10/2017 CLINICAL DATA:  81 year old female with shortness of breath starting yesterday. Chest cold for the past month. Breast cancer post left mastectomy. Subsequent encounter. EXAM: CT ANGIOGRAPHY CHEST WITH CONTRAST TECHNIQUE: Multidetector CT imaging of the chest was performed using the standard protocol during bolus administration of intravenous contrast. Multiplanar CT image reconstructions and MIPs were obtained to evaluate the vascular anatomy. CONTRAST:  65 cc Isovue 370. COMPARISON:  07/10/2017 and 05/26/2012 chest x-ray. Kyphoplasty 05/10/2017. Abdominal CT 03/15/2012. FINDINGS: Cardiovascular: Small to slightly moderate-size pericardial effusion. Heart slightly enlarged. Coronary artery calcifications. Slight motion degradation without evidence of pulmonary embolus or aortic dissection. Mediastinum/Nodes: Subcarinal adenopathy. Slightly rounded lymph nodes preaortic region. Small hiatal hernia. Lungs/Pleura: Left lower lobe consolidation with air bronchograms. Slight narrowing of the left  lower lobe bronchus without obstructing mass identified. Moderately large left-sided pleural effusion. Small right-sided pleural effusion and right base atelectasis. Upper Abdomen: Renal cysts and parapelvic cyst unchanged. Mild prominence adrenal glands minimally more prominent than on the prior exam. Musculoskeletal: Post cement augmentation T12/L1 compression fracture with kyphosis centered at  this level. Remote appearing T5 superior endplate compression fracture/ Schmorl's node deformity. Post right shoulder replacement. Post left mastectomy with reconstruction. Small amount of fluid surrounding the left breast implants. Review of the MIP images confirms the above findings. IMPRESSION: Post left mastectomy with reconstruction. Slight motion degradation without evidence of pulmonary embolus or aortic dissection. Left lower lobe consolidation with air bronchograms. Slight narrowing of the left lower lobe bronchus without obstructing mass identified. Moderately large left-sided pleural effusion. Small right-sided pleural effusion and right base atelectasis. Small to slightly moderate-size pericardial effusion. Subcarinal adenopathy and slightly rounded lymph nodes preaortic region. Findings may reflect result of infectious process. Malignant process a secondary consideration. Although there is mild pulmonary vascular congestion, appearance is not typical for pulmonary edema. Mild prominence adrenal glands minimally more prominent than on prior exam. Coronary artery calcifications. Aortic Atherosclerosis (ICD10-I70.0). Electronically Signed   By: Genia Del M.D.   On: 07/10/2017 17:14   Dg Chest Port 1 View  Result Date: 07/10/2017 CLINICAL DATA:  Acute onset of cough and shortness of breath which began several days ago. EXAM: PORTABLE CHEST 1 VIEW COMPARISON:  05/26/2012. FINDINGS: External pacing pads are present. Cardiac silhouette markedly enlarged, increased in size since 2013. Thoracic aorta atherosclerotic, unchanged. Hilar and mediastinal contours otherwise unremarkable. Consolidation involving the left lower lobe and lingula silhouetting the left hemidiaphragm and the left heart border. Pulmonary vascularity normal without evidence of pulmonary edema. Surgical clips in the left chest wall and left axilla related to prior left mastectomy, axillary node dissection and reconstruction. Prior  right shoulder arthroplasty. IMPRESSION: 1. Atelectasis and/or pneumonia involving the left lower lobe and lingula. 2. Marked cardiomegaly, with interval increase in heart size since 2013. No evidence of pulmonary edema. 3. Thoracic aortic atherosclerosis. Electronically Signed   By: Evangeline Dakin M.D.   On: 07/10/2017 12:17    STUDIES:  CXR 7/23 > infiltrate at LLL and lingula.  CTA 7/23 > negative for PE but did demonstrate LLL consolidation with air bronchograms, moderate to large left sided effusion, small right effusion, small to mod pericardial effsuion, subcarinal adenopathy. Echo 7/23 >   SIGNIFICANT EVENTS  7/23 > admit.  ANTIBIOTICS: Levaquin 7/23 >   CULTURES: Blood 7/2 >  Sputum 7/2 >   ASSESSMENT / PLAN:  CAP. Pleural effusions L > R. Acute hypoxic respiratory failure - due to above. Plan: Continue empiric levaquin.  Can d/c vanc. Follow cultures. Incentive spirometry. 29mEq lasix now. Night team to assess pleural space under [place] and consider diagnostic and therapeutic thoracentesis. Continue supplemental O2 as needed to maintain SpO2 > 92%. CXR in AM.  Tachycardia - ? SVT vs A.fib.  EKG of poor quality likely due to motion. Pericardial effusion. Hx HTN, HLD. Plan: Assess TSH. Lopressor PRN. Continue preadmission pravastatin. Hold preadmission amlodipine, prinzide, metoprolol. Assess echo. May need cards consult.  DM. Plan: SSI. Hold preadmission glimepiride, metformin.  Rest per primary team.   Montey Hora, [facility] Pager: [phone]  or [phone] 07/10/2017, 6:51 PM

## 2017-07-10 NOTE — Progress Notes (Signed)
    Of note, Pt meets sepsis criteria secondary to CAP w/ a lactic acid of 2.7, acute respiratory failure, tachycardia, hypotension, tachypnea, and leukocytosis. Sepsis orderset utilized. Appreciate the assistance of CCM in the management of this patient.   Linna Darner, MD [facility] 07/10/2017, 10:10 PM

## 2017-07-10 NOTE — ED Notes (Signed)
Pt HR increased to 135bpm at this time. Spo2 98% on 4 lpm [place]. Pt denies pain at this time.

## 2017-07-10 NOTE — H&P (Signed)
History and Physical    RAKYA GIAMBATTISTA GMW:[id number] DOB: 07/05/1936 DOA: 07/10/2017  PCP: [name], MD Patient coming from: Home  Chief Complaint: SOB  HPI: VIRLA CORDERY is a 81 y.o. female with medical history significant of diabetes, hyper lipidemia, breast cancer status post left mastectomy and reconstruction, hypertension, hypothyroidism, panic attacks, SVT. Patient presented with a primary complaint of feeling short of breath. Found to have a heart rate in the 170s when EMS was called to her house. Patient reports approximately 2 week history of URI type symptoms including nasal and sinus congestion, cough and intermittent fevers. Patient went through a period of approximately 10 days of symptoms will and was gradually improving. However the last 2-3 days patient began to become worse again. Patient denies actual chest pain, palpitations, lower extremity swelling, abdominal pain, dysuria, frequency, neck stiffness, headache, focal neurological deficits.  At time of my exam patient developed acute respiratory failure. This happened within seconds. Patient was in a state described above when she had a coughing episode and then grabbed her chest. She turned very red and looked to struggle to breathe. She explained that she felt extremely short of breath, and was unable to get enough air. This is associated with a feeling of chest pain. At this time O2 saturations dropped into the 50s with intermittent waveform on pulse ox. With good pulse ox waveform O2 saturations quickly rose to the low 80s. Prior to this patient has not been hypoxic. This lasted several minutes. At this time I ordered heparin to be initiated for concern of a pulmonary embolus and ordered a stat CTA chest. Approximately 30 minutes later nursing staff called me again to bedside for patient now appeared very pale and ill appearing. Patient required nonrebreather mask and had an ABG drawn. Additional labs and orders were  placed.  CTA was reviewed personally by me and reviewed with the patient. I discussed case with CCM who agreed to see the patient consultation for thoracentesis and consultation/admission if patient decompensates.    ED Course: Patient started on vancomycin and Levaquin for community-acquired pneumonia and was given adenosine to treat SVT successfully. Patient started on 1 L normal saline bolus.  Review of Systems: As per HPI otherwise all other systems reviewed and are negative  Ambulatory Status: no restrictions   Past Medical History:  Diagnosis Date  . DM2 (diabetes mellitus, type 2) (HCC)   . Hyperlipidemia   . Hypertension   . Hypothyroidism   . Panic attacks    mild  . SVT (supraventricular tachycardia) (HCC)    in the past  . Wears glasses     Past Surgical History:  Procedure Laterality Date  . ABDOMINAL HYSTERECTOMY     BSO as well  . APPENDECTOMY    . APPLICATION OF A-CELL OF EXTREMITY Left 01/15/2015   Procedure: APPLICATION OF A-CELL OF EXTREMITY;  Surgeon: Wayland Denis, DO;  Location: [facility];  Service: Plastics;  Laterality: Left;  . CHOLECYSTECTOMY    . I&D EXTREMITY Left 01/15/2015   Procedure: IRRIGATION AND DEBRIDEMENT EXTREMITY;  Surgeon: Wayland Denis, DO;  Location: [facility];  Service: Plastics;  Laterality: Left;  . IR KYPHO EA ADDL LEVEL THORACIC OR LUMBAR  05/10/2017  . IR KYPHO THORACIC WITH BONE BIOPSY  05/10/2017  . IR RADIOLOGIST EVAL & MGMT  04/28/2017  . MASTECTOMY  1985   Left  . RECONSTRUCTION BREAST W/ LATISSIMUS DORSI FLAP    . TOTAL SHOULDER ARTHROPLASTY  05/27/2012   Procedure: TOTAL SHOULDER ARTHROPLASTY;  Surgeon: [name], MD;  Location[facility] OR;  Service: Orthopedics;  Laterality: Right;    Social History   Social History  . Marital status: Widowed    Spouse name: N/A  . Number of children: N/A  . Years of education: N/A   Occupational History  . retired     had [employer] business    Social History Main Topics  . Smoking status: Never Smoker  . Smokeless tobacco: Never Used  . Alcohol use No  . Drug use: No  . Sexual activity: No   Other Topics Concern  . Not on file   Social History Narrative  . No narrative on file    Allergies  Allergen Reactions  . Penicillins Anaphylaxis and Hives    Has patient had a PCN reaction causing immediate rash, facial/tongue/throat swelling, SOB or lightheadedness with hypotension: Yes Has patient had a PCN reaction causing severe rash involving mucus membranes or skin necrosis: Yes Has patient had a PCN reaction that required hospitalization: No Has patient had a PCN reaction occurring within the last 10 years: Yes If all of the above answers are "NO", then may proceed with Cephalosporin use.   Marland Kitchen Keflex [Cephalexin]     Rash     Family History  Problem Relation Age of Onset  . Heart disease Mother   . Hypertension Mother   . Stroke Father   . Hypertension Father   . Coronary artery disease Unknown        family hx of  . Diabetes Unknown        family hx of  . Hypertension Unknown        family hx of  . Hypertension Sister       Prior to Admission medications   Medication Sig Start Date End Date Taking? Authorizing Provider  amLODipine (NORVASC) 5 MG tablet Take 1 tablet (5 mg total) by mouth daily. Patient taking differently: Take 5 mg by mouth 2 (two) times daily.  04/05/17  Yes Burchette, Elberta Fortis, MD  calcium-vitamin D (OSCAL WITH D) 500-200 MG-UNIT per tablet Take 1 tablet by mouth daily. 05/27/12  Yes Teryl Lucy, MD  glimepiride (AMARYL) 2 MG tablet TAKE ONE TABLET BY MOUTH ONCE DAILY WITH  BREAKFAST Patient taking differently: Take 2 mg by mouth daily with breakfast.  04/05/17  Yes Burchette, Elberta Fortis, MD  ibuprofen (ADVIL,MOTRIN) 200 MG tablet Take 200 mg by mouth every 6 (six) hours as needed for mild pain.   Yes [provider]  levothyroxine (SYNTHROID, LEVOTHROID) 137 MCG tablet Take 1 tablet  (137 mcg total) by mouth daily before breakfast. 04/05/17  Yes Burchette, Elberta Fortis, MD  lisinopril-hydrochlorothiazide (PRINZIDE,ZESTORETIC) 20-25 MG tablet Take 1 tablet by mouth daily. 04/05/17  Yes Burchette, Elberta Fortis, MD  metFORMIN (GLUCOPHAGE XR) 750 MG 24 hr tablet Take 1 tablet (750 mg total) by mouth 2 (two) times daily at 10 AM and 5 PM. 04/05/17  Yes Burchette, Elberta Fortis, MD  metoprolol (LOPRESSOR) 50 MG tablet Take 1 tablet (50 mg total) by mouth 2 (two) times daily. 04/05/17  Yes Burchette, Elberta Fortis, MD  naproxen sodium (ANAPROX) 220 MG tablet Take 220 mg by mouth daily.    Yes [provider]  pravastatin (PRAVACHOL) 40 MG tablet Take 1 tablet (40 mg total) by mouth daily. 04/05/17  Yes [name], MD    Physical Exam: Vitals:   07/10/17 1700 07/10/17 1730 07/10/17 1740  07/10/17 1750  BP: 103/67 97/69 100/61 106/68  Pulse: (!) 141 (!) 148 (!) 142 (!) 139  Resp: (!) 29 (!) 24 (!) 29 (!) 23  SpO2: 100% 100% 98% 100%  Weight:      Height:         General: Appears anxious, resting in bed Eyes:  PERRL, EOMI, normal lids, iris ENT:  grossly normal hearing, lips & tongue, mmm Neck:  no LAD, masses or thyromegaly Cardiovascular:  RRR, difficult to appreciate due to respiratory findings. trace LE edema.  Respiratory: Increased effort. O2 via NR, crackles and ronchi in L lung w/ diminished breath sounds throughout Abdomen:  soft, ntnd, NABS Skin:  no rash or induration seen on limited exam Musculoskeletal:  grossly normal tone BUE/BLE, good ROM, no bony abnormality Psychiatric:  grossly normal mood and affect, speech fluent and appropriate, AOx3 Neurologic:  CN 2-12 grossly intact, moves all extremities in coordinated fashion, sensation intact  Labs on Admission: I have personally reviewed following labs and imaging studies  CBC:  Recent Labs Lab 07/10/17 1239 07/10/17 1459  WBC  --  16.2*  NEUTROABS  --  13.6*  HGB 11.6* 13.0  HCT 34.0* 39.3  MCV  --  83.6  PLT   --  PLATELET CLUMPS NOTED ON SMEAR, COUNT APPEARS INCREASED   Basic Metabolic Panel:  Recent Labs Lab 07/10/17 1239  NA 137  K 4.3  CL 102  GLUCOSE 241*  BUN 34*  CREATININE 1.00   GFR: Estimated Creatinine Clearance: 54.9 mL/min (by C-G formula based on SCr of 1 mg/dL). Liver Function Tests: No results for input(s): AST, ALT, ALKPHOS, BILITOT, PROT, ALBUMIN in the last 168 hours. No results for input(s): LIPASE, AMYLASE in the last 168 hours. No results for input(s): AMMONIA in the last 168 hours. Coagulation Profile: No results for input(s): INR, PROTIME in the last 168 hours. Cardiac Enzymes: No results for input(s): CKTOTAL, CKMB, CKMBINDEX, TROPONINI in the last 168 hours. BNP (last 3 results) No results for input(s): PROBNP in the last 8760 hours. HbA1C: No results for input(s): HGBA1C in the last 72 hours. CBG: No results for input(s): GLUCAP in the last 168 hours. Lipid Profile: No results for input(s): CHOL, HDL, LDLCALC, TRIG, CHOLHDL, LDLDIRECT in the last 72 hours. Thyroid Function Tests: No results for input(s): TSH, T4TOTAL, FREET4, T3FREE, THYROIDAB in the last 72 hours. Anemia Panel: No results for input(s): VITAMINB12, FOLATE, FERRITIN, TIBC, IRON, RETICCTPCT in the last 72 hours. Urine analysis:    Component Value Date/Time   COLORURINE YELLOW 05/26/2012 2349   APPEARANCEUR CLOUDY (A) 05/26/2012 2349   LABSPEC 1.033 (H) 05/26/2012 2349   PHURINE 5.0 05/26/2012 2349   GLUCOSEU 250 (A) 05/26/2012 2349   HGBUR SMALL (A) 05/26/2012 2349   BILIRUBINUR NEGATIVE 05/26/2012 2349   KETONESUR 15 (A) 05/26/2012 2349   PROTEINUR NEGATIVE 05/26/2012 2349   UROBILINOGEN 1.0 05/26/2012 2349   NITRITE NEGATIVE 05/26/2012 2349   LEUKOCYTESUR SMALL (A) 05/26/2012 2349    Creatinine Clearance: Estimated Creatinine Clearance: 54.9 mL/min (by C-G formula based on SCr of 1 mg/dL).  Sepsis Labs: @LABRCNTIP (procalcitonin:4,lacticidven:4) )No results found for this  or any previous visit (from the past 240 hour(s)).   Radiological Exams on Admission: Ct Angio Chest Pe W Or Wo Contrast  Result Date: 07/10/2017 CLINICAL DATA:  81 year old female with shortness of breath starting yesterday. Chest cold for the past month. Breast cancer post left mastectomy. Subsequent encounter. EXAM: CT ANGIOGRAPHY CHEST WITH CONTRAST TECHNIQUE: Multidetector CT  imaging of the chest was performed using the standard protocol during bolus administration of intravenous contrast. Multiplanar CT image reconstructions and MIPs were obtained to evaluate the vascular anatomy. CONTRAST:  65 cc Isovue 370. COMPARISON:  07/10/2017 and 05/26/2012 chest x-ray. Kyphoplasty 05/10/2017. Abdominal CT 03/15/2012. FINDINGS: Cardiovascular: Small to slightly moderate-size pericardial effusion. Heart slightly enlarged. Coronary artery calcifications. Slight motion degradation without evidence of pulmonary embolus or aortic dissection. Mediastinum/Nodes: Subcarinal adenopathy. Slightly rounded lymph nodes preaortic region. Small hiatal hernia. Lungs/Pleura: Left lower lobe consolidation with air bronchograms. Slight narrowing of the left lower lobe bronchus without obstructing mass identified. Moderately large left-sided pleural effusion. Small right-sided pleural effusion and right base atelectasis. Upper Abdomen: Renal cysts and parapelvic cyst unchanged. Mild prominence adrenal glands minimally more prominent than on the prior exam. Musculoskeletal: Post cement augmentation T12/L1 compression fracture with kyphosis centered at this level. Remote appearing T5 superior endplate compression fracture/ Schmorl's node deformity. Post right shoulder replacement. Post left mastectomy with reconstruction. Small amount of fluid surrounding the left breast implants. Review of the MIP images confirms the above findings. IMPRESSION: Post left mastectomy with reconstruction. Slight motion degradation without evidence of  pulmonary embolus or aortic dissection. Left lower lobe consolidation with air bronchograms. Slight narrowing of the left lower lobe bronchus without obstructing mass identified. Moderately large left-sided pleural effusion. Small right-sided pleural effusion and right base atelectasis. Small to slightly moderate-size pericardial effusion. Subcarinal adenopathy and slightly rounded lymph nodes preaortic region. Findings may reflect result of infectious process. Malignant process a secondary consideration. Although there is mild pulmonary vascular congestion, appearance is not typical for pulmonary edema. Mild prominence adrenal glands minimally more prominent than on prior exam. Coronary artery calcifications. Aortic Atherosclerosis (ICD10-I70.0). Electronically Signed   By: Lacy Duverney M.D.   On: 07/10/2017 17:14   Dg Chest Port 1 View  Result Date: 07/10/2017 CLINICAL DATA:  Acute onset of cough and shortness of breath which began several days ago. EXAM: PORTABLE CHEST 1 VIEW COMPARISON:  05/26/2012. FINDINGS: External pacing pads are present. Cardiac silhouette markedly enlarged, increased in size since 2013. Thoracic aorta atherosclerotic, unchanged. Hilar and mediastinal contours otherwise unremarkable. Consolidation involving the left lower lobe and lingula silhouetting the left hemidiaphragm and the left heart border. Pulmonary vascularity normal without evidence of pulmonary edema. Surgical clips in the left chest wall and left axilla related to prior left mastectomy, axillary node dissection and reconstruction. Prior right shoulder arthroplasty. IMPRESSION: 1. Atelectasis and/or pneumonia involving the left lower lobe and lingula. 2. Marked cardiomegaly, with interval increase in heart size since 2013. No evidence of pulmonary edema. 3. Thoracic aortic atherosclerosis. Electronically Signed   By: Hulan Saas M.D.   On: 07/10/2017 12:17     EKG: Independently reviewed. SVT w/ no ACS.    Assessment/Plan Active Problems:   Hypothyroidism   SVT (supraventricular tachycardia) (HCC)   Hyperlipidemia   CAP (community acquired pneumonia)   Acute respiratory failure (HCC)   Diabetes mellitus with complication (HCC)   Pleural effusion on left   Lactic acidemia    Acute hypoxic respiratory failure: Likely related to large left pleural effusion in the setting of infectious process though cannot exclude malignancy. Initially there is concern for pulmonary of less than CTA negative for PE. Pt may have developed a mucus plug as well during coughing episode that caused transient hypoxemia. Currently pt on RA. ABG as above.  - Wean O2 as able - Stop Heparin (no PE) - Continue Vanc/Levaquin. Due to allergies and  severity of condition (started by EDP) - BCX (obtained after ABX started) - pneumonia orderset - CCM consulted in case pt condition deterioates and for thoracentesis on 7/24 - repeat CXR in am  SVT: HR 170s on presentation to ED. Adenosine 6 given w/ resolution. HR slowly trending up after episode described above. Repeat EKG w/ significant artivact. Improving w/ IVF and as pt less anxious - Echo - Repeat EKG - Tele, Cycle trop  Hypertension: hypotensive after episode described above. Received adenosine and metop in the ED - IVF - hold home norvasc, metop, lisinopril, HCTZ  Lactic acidemia: likely from SVT and infection in the setting of dehydration - Trend lactic acid.  - IVF  Pericardial effusion noted: - Echo in am  HLD: - continue statin  DM: - SSI    DVT prophylaxis: SCD in anticipation of thoracentesis  Code Status: full  Family Communication: none  Disposition Plan: pending improvement  Consults called: CCM  Admission status: inpt    Cristina Ceniceros J MD [facility]  If 7PM-7AM, please contact night-coverage [email or web address] Password [facility]  07/10/2017, 6:40 PM       Critical Care Time This patient is critically ill requiring  frequent monitoring and support due to their life/organ threatening condition. This care involves a high complexity of medical decision making. Greater than 70 minutes spent in direct patient care and coordination.

## 2017-07-10 NOTE — ED Notes (Signed)
Transported pt to CT- room not ready at this time. Will call back when scanner is ready. PT is now shaking and states she is cold. Spo2 82%. Pt on 4 lpm [facility]. Admitting aware and starting heparin drip now.

## 2017-07-10 NOTE — Progress Notes (Signed)
Pharmacy Antibiotic Note  Michaela Rhodes is a 81 y.o. female admitted on 07/10/2017 with pneumonia.  Pharmacy has been consulted for Levaquin dosing.    Levaquin 750 mg IV given in ED ~2pm today   Vancomycin 2 gm IV x 1 given in ED, now discontinued.   IV heparin also discontinued - CTA negative for PE.  Plan:  Continue Levaquin 750 mg IV q24hrs - next dose 7/24  Follow renal function, culture data, clinical progress.  Height: 5\' 7"  (170.2 cm) Weight: 217 lb 14.4 oz (98.8 kg) IBW/kg (Calculated) : 61.6  Temp (24hrs), Avg:98.5 F (36.9 C), Min:98.5 F (36.9 C), Max:98.5 F (36.9 C)   Recent Labs Lab 07/10/17 1239 07/10/17 1459 07/10/17 1627  WBC  --  16.2*  --   CREATININE 1.00  --   --   LATICACIDVEN  --   --  2.70*    Estimated Creatinine Clearance: 55.1 mL/min (by C-G formula based on SCr of 1 mg/dL).    Allergies  Allergen Reactions  . Penicillins Anaphylaxis and Hives    Has patient had a PCN reaction causing immediate rash, facial/tongue/throat swelling, SOB or lightheadedness with hypotension: Yes Has patient had a PCN reaction causing severe rash involving mucus membranes or skin necrosis: Yes Has patient had a PCN reaction that required hospitalization: No Has patient had a PCN reaction occurring within the last 10 years: Yes If all of the above answers are "NO", then may proceed with Cephalosporin use.   Marland Kitchen Keflex [Cephalexin]     Rash     Antimicrobials this admission:  Vancomycin x 1 o n7/23  Levaquin 7/23>>  Dose adjustments this admission:  n/a  Microbiology results:  7/23 blood x 2 -  7/23 sputum -  7/23 Strep pneum Ag -  7/23 Legionella Ag -  Thank you for allowing pharmacy to be a part of this patient's care.  Arty Baumgartner, [place] Pager: [phone] 07/10/2017 7:01 PM

## 2017-07-10 NOTE — ED Provider Notes (Addendum)
[facility] DEPT Provider Note   CSN: [id number] Arrival date & time: 07/10/17  8676     History   Chief Complaint Chief Complaint  Patient presents with  . Shortness of Breath    HPI Michaela Rhodes is a 81 y.o. female.  HPI   81 year old female who presents today complaining of tachycardia. She has a history of SVT in the past. She called EMS because she has had cold symptoms. She complains of symptoms beginning several weeks ago with nasal congestion followed by some cough. Had increased cough since last night. EMS arrived and found her heart rate was 170. Prehospital EKG showed rhythm consistent with SVT. She was not treated with any medication prehospital. She was given oxygen. She remained hemodynamically stable and route. She denies any chest pain. She has had some subjective fever and chills. She has not had nausea or vomiting. History of blood clots or swelling in her legs or history of PE.  Past Medical History:  Diagnosis Date  . DM2 (diabetes mellitus, type 2) ([place])   . Hyperlipidemia   . Hypertension   . Hypothyroidism   . Panic attacks    mild  . SVT (supraventricular tachycardia) ([facility])    in the past  . Wears glasses     Patient Active Problem List   Diagnosis Date Noted  . Osteopenia 04/02/2016  . Diabetes type 2, uncontrolled ([place])   . Other specified hypothyroidism   . Acute renal failure syndrome ([place])   . Cellulitis 12/27/2014  . Cellulitis of left lower extremity 12/26/2014  . Obesity (BMI 30-39.9) 08/30/2013  . Hyperlipidemia 09/25/2012  . Breast cancer, left ([place]) 07/26/2012  . Syncope 06/26/2012  . Type 2 diabetes mellitus, uncontrolled ([place]) 05/30/2012  . Acute renal failure ([place]) 05/30/2012  . HTN (hypertension) 05/30/2012  . Postoperative anemia due to acute blood loss 05/29/2012  . Tachycardia 05/27/2012  . Hyperglycemia 05/27/2012  . Closed fracture of right proximal humerus 05/26/2012  . OVERWEIGHT 10/19/2009  . Hypothyroidism  10/16/2009  . Essential hypertension 10/16/2009  . SVT (supraventricular tachycardia) ([place]) 10/16/2009    Past Surgical History:  Procedure Laterality Date  . ABDOMINAL HYSTERECTOMY     BSO as well  . APPENDECTOMY    . APPLICATION OF A-CELL OF EXTREMITY Left 01/15/2015   Procedure: APPLICATION OF A-CELL OF EXTREMITY;  Surgeon: Theodoro Kos, DO;  Location: [facility];  Service: Plastics;  Laterality: Left;  . CHOLECYSTECTOMY    . I&D EXTREMITY Left 01/15/2015   Procedure: IRRIGATION AND DEBRIDEMENT EXTREMITY;  Surgeon: Theodoro Kos, DO;  Location: [facility];  Service: Plastics;  Laterality: Left;  . IR KYPHO EA ADDL LEVEL THORACIC OR LUMBAR  05/10/2017  . IR KYPHO THORACIC WITH BONE BIOPSY  05/10/2017  . IR RADIOLOGIST EVAL & MGMT  04/28/2017  . MASTECTOMY  1985   Left  . RECONSTRUCTION BREAST W/ LATISSIMUS DORSI FLAP    . TOTAL SHOULDER ARTHROPLASTY  05/27/2012   Procedure: TOTAL SHOULDER ARTHROPLASTY;  Surgeon: Johnny Bridge, MD;  Location: [facility];  Service: Orthopedics;  Laterality: Right;    OB History    No data available       Home Medications    Prior to Admission medications   Medication Sig Start Date End Date Taking? Authorizing Provider  amLODipine (NORVASC) 5 MG tablet Take 1 tablet (5 mg total) by mouth daily. Patient taking differently: Take 5 mg by mouth 2 (two) times daily.  04/05/17  Yes Burchette,  Alinda Sierras, MD  calcium-vitamin D (OSCAL WITH D) 500-200 MG-UNIT per tablet Take 1 tablet by mouth daily. 05/27/12  Yes Marchia Bond, MD  glimepiride (AMARYL) 2 MG tablet TAKE ONE TABLET BY MOUTH ONCE DAILY WITH  BREAKFAST Patient taking differently: Take 2 mg by mouth daily with breakfast.  04/05/17  Yes Burchette, Alinda Sierras, MD  ibuprofen (ADVIL,MOTRIN) 200 MG tablet Take 200 mg by mouth every 6 (six) hours as needed for mild pain.   Yes [provider]  levothyroxine (SYNTHROID, LEVOTHROID) 137 MCG tablet Take 1 tablet (137 mcg total)  by mouth daily before breakfast. 04/05/17  Yes Burchette, Alinda Sierras, MD  lisinopril-hydrochlorothiazide (PRINZIDE,ZESTORETIC) 20-25 MG tablet Take 1 tablet by mouth daily. 04/05/17  Yes Burchette, Alinda Sierras, MD  metFORMIN (GLUCOPHAGE XR) 750 MG 24 hr tablet Take 1 tablet (750 mg total) by mouth 2 (two) times daily at 10 AM and 5 PM. 04/05/17  Yes Burchette, Alinda Sierras, MD  metoprolol (LOPRESSOR) 50 MG tablet Take 1 tablet (50 mg total) by mouth 2 (two) times daily. 04/05/17  Yes Burchette, Alinda Sierras, MD  naproxen sodium (ANAPROX) 220 MG tablet Take 220 mg by mouth daily.    Yes [provider]  pravastatin (PRAVACHOL) 40 MG tablet Take 1 tablet (40 mg total) by mouth daily. 04/05/17  Yes Burchette, Alinda Sierras, MD    Family History Family History  Problem Relation Age of Onset  . Heart disease Mother   . Hypertension Mother   . Stroke Father   . Hypertension Father   . Coronary artery disease Unknown        family hx of  . Diabetes Unknown        family hx of  . Hypertension Unknown        family hx of  . Hypertension Sister     Social History Social History  Substance Use Topics  . Smoking status: Never Smoker  . Smokeless tobacco: Never Used  . Alcohol use No     Allergies   Penicillins and Keflex [cephalexin]   Review of Systems Review of Systems  Constitutional: Positive for chills and fever.  HENT: Positive for congestion.   Eyes: Negative.   Respiratory: Positive for cough and shortness of breath.   Cardiovascular: Negative.   Gastrointestinal: Negative.   Endocrine: Negative.   Musculoskeletal: Negative.   Allergic/Immunologic: Negative.   Neurological: Negative.   Hematological: Negative.   All other systems reviewed and are negative.    Physical Exam Updated Vital Signs BP 97/70   Pulse 85   Resp (!) 29   SpO2 95%   Physical Exam  Constitutional: She is oriented to person, place, and time. She appears well-developed and well-nourished. No distress.    HENT:  Head: Normocephalic and atraumatic.  Right Ear: External ear normal.  Left Ear: External ear normal.  Nose: Nose normal.  Eyes: Pupils are equal, round, and reactive to light. Conjunctivae and EOM are normal.  Neck: Normal range of motion. Neck supple.  Cardiovascular: Tachycardia present.   Pulmonary/Chest: Effort normal.  Abdominal: Soft. Bowel sounds are normal.  Musculoskeletal: Normal range of motion.  Neurological: She is alert and oriented to person, place, and time. She exhibits normal muscle tone. Coordination normal.  Skin: Skin is warm and dry.  Psychiatric: She has a normal mood and affect. Her behavior is normal. Thought content normal.  Nursing note and vitals reviewed.    ED Treatments / Results  Labs (all labs ordered are  listed, but only abnormal results are displayed) Labs Reviewed  I-STAT CHEM 8, ED - Abnormal; Notable for the following:       Result Value   BUN 34 (*)    Glucose, Bld 241 (*)    Calcium, Ion 1.06 (*)    Hemoglobin 11.6 (*)    HCT 34.0 (*)    All other components within normal limits  I-STAT TROPONIN, ED    EKG  EKG Interpretation  Date/Time:  Monday July 10 2017 14:01:12 EDT Ventricular Rate:  87 PR Interval:    QRS Duration: 104 QT Interval:  376 QTC Calculation: 453 R Axis:   -130 Text Interpretation:  Sinus rhythm Right axis deviation Low voltage, precordial leads Nonspecific T abnrm, anterolateral leads Confirmed by Pattricia Boss [phone]) on 07/10/2017 2:50:22 PM       Radiology Dg Chest Port 1 View  Result Date: 07/10/2017 CLINICAL DATA:  Acute onset of cough and shortness of breath which began several days ago. EXAM: PORTABLE CHEST 1 VIEW COMPARISON:  05/26/2012. FINDINGS: External pacing pads are present. Cardiac silhouette markedly enlarged, increased in size since 2013. Thoracic aorta atherosclerotic, unchanged. Hilar and mediastinal contours otherwise unremarkable. Consolidation involving the left lower lobe and  lingula silhouetting the left hemidiaphragm and the left heart border. Pulmonary vascularity normal without evidence of pulmonary edema. Surgical clips in the left chest wall and left axilla related to prior left mastectomy, axillary node dissection and reconstruction. Prior right shoulder arthroplasty. IMPRESSION: 1. Atelectasis and/or pneumonia involving the left lower lobe and lingula. 2. Marked cardiomegaly, with interval increase in heart size since 2013. No evidence of pulmonary edema. 3. Thoracic aortic atherosclerosis. Electronically Signed   By: Evangeline Dakin M.D.   On: 07/10/2017 12:17    Procedures Procedures (including critical care time)  Medications Ordered in ED Medications  sodium chloride 0.9 % bolus 500 mL (not administered)  metoprolol tartrate (LOPRESSOR) tablet 50 mg (50 mg Oral Given 07/10/17 1015)  adenosine (ADENOCARD) 6 MG/2ML injection 6 mg (6 mg Intravenous Given 07/10/17 1003)     Initial Impression / Assessment and Plan / ED Course  I have reviewed the triage vital signs and the nursing notes.  Pertinent labs & imaging results that were available during my care of the patient were reviewed by me and considered in my medical decision making (see chart for details).    81 year old female type 2 diabetes, hypertension, history of breast cancer presents today with increasing cough, dyspnea, SVT. SVT resolved here after a [place]. Patient with left lower lobe and left lingular infiltrate. She is allergic to penicillin and is being treated with vancomycin and Levaquin. Patient is receiving IV fluids. Blood pressures have ranged from 70-263 systolically. 87 was when she was still an SVT with a heart rate of 170. She is having lactic acid drawn. IV fluids are infusing.   CRITICAL CARE Performed by: Shaune Pollack Total critical care time: 30 minutes Critical care time was exclusive of separately billable procedures and treating other patients. Critical care was  necessary to treat or prevent imminent or life-threatening deterioration. Critical care was time spent personally by me on the following activities: development of treatment plan with patient and/or surrogate as well as nursing, discussions with consultants, evaluation of patient's response to treatment, examination of patient, obtaining history from patient or surrogate, ordering and performing treatments and interventions, ordering and review of laboratory studies, ordering and review of radiographic studies, pulse oximetry and re-evaluation of patient's condition.  Final Clinical  Impressions(s) / ED Diagnoses   Final diagnoses:  Community acquired pneumonia of left lower lobe of lung ([place])  SVT (supraventricular tachycardia) ([place])   Discussed with Dr. Marily Memos and he will see and admit  New Prescriptions New Prescriptions   No medications on file     Pattricia Boss, MD 07/10/17 [place], [place], MD 07/10/17 1531

## 2017-07-10 NOTE — Progress Notes (Signed)
Pharmacy Antibiotic Note  Michaela Rhodes is a 81 y.o. female admitted on 07/10/2017 with pneumonia.  Pharmacy has been consulted for vancomycin dosing. WBC is mildly elevated at 11.6 and SCr is WNL.   Plan: Vancomycin 2gm IV x 1 then 750mg  IV Q12H F/u renal fxn, C&S, clinical status and trough at SS  Height: 5\' 7"  (170.2 cm) Weight: 216 lb 0.8 oz (98 kg) IBW/kg (Calculated) : 61.6  No data recorded.   Recent Labs Lab 07/10/17 1239  CREATININE 1.00    Estimated Creatinine Clearance: 54.9 mL/min (by C-G formula based on SCr of 1 mg/dL).    Allergies  Allergen Reactions  . Penicillins Anaphylaxis and Hives    Has patient had a PCN reaction causing immediate rash, facial/tongue/throat swelling, SOB or lightheadedness with hypotension: Yes Has patient had a PCN reaction causing severe rash involving mucus membranes or skin necrosis: Yes Has patient had a PCN reaction that required hospitalization: No Has patient had a PCN reaction occurring within the last 10 years: Yes If all of the above answers are "NO", then may proceed with Cephalosporin use.   Marland Kitchen Keflex [Cephalexin]     Rash     Antimicrobials this admission: Vanc 7/23>> Levaquin x 1 7/23  Dose adjustments this admission: N/A  Microbiology results: Pending  Thank you for allowing pharmacy to be a part of this patient's care.  Jovita Persing, Rande Lawman 07/10/2017 1:34 PM

## 2017-07-10 NOTE — ED Notes (Signed)
Pt remains on 10lpm NRB. Back from CT. Pt denies pain at this time. HR remains 140

## 2017-07-11 ENCOUNTER — Inpatient Hospital Stay (HOSPITAL_COMMUNITY): Payer: Medicare HMO

## 2017-07-11 DIAGNOSIS — R0902 Hypoxemia: Secondary | ICD-10-CM

## 2017-07-11 DIAGNOSIS — I471 Supraventricular tachycardia: Secondary | ICD-10-CM

## 2017-07-11 DIAGNOSIS — J90 Pleural effusion, not elsewhere classified: Secondary | ICD-10-CM

## 2017-07-11 DIAGNOSIS — J9601 Acute respiratory failure with hypoxia: Secondary | ICD-10-CM

## 2017-07-11 DIAGNOSIS — J181 Lobar pneumonia, unspecified organism: Secondary | ICD-10-CM

## 2017-07-11 LAB — BODY FLUID CELL COUNT WITH DIFFERENTIAL
EOS FL: 6 %
Lymphs, Fluid: 13 %
MONOCYTE-MACROPHAGE-SEROUS FLUID: 28 % — AB (ref 50–90)
NEUTROPHIL FLUID: 53 % — AB (ref 0–25)
WBC FLUID: 867 uL (ref 0–1000)

## 2017-07-11 LAB — CBC
HEMATOCRIT: 33.8 % — AB (ref 36.0–46.0)
Hemoglobin: 10.9 g/dL — ABNORMAL LOW (ref 12.0–15.0)
MCH: 26.8 pg (ref 26.0–34.0)
MCHC: 32.2 g/dL (ref 30.0–36.0)
MCV: 83 fL (ref 78.0–100.0)
PLATELETS: 436 10*3/uL — AB (ref 150–400)
RBC: 4.07 MIL/uL (ref 3.87–5.11)
RDW: 13.6 % (ref 11.5–15.5)
WBC: 20.3 10*3/uL — AB (ref 4.0–10.5)

## 2017-07-11 LAB — GLUCOSE, CAPILLARY
GLUCOSE-CAPILLARY: 177 mg/dL — AB (ref 65–99)
GLUCOSE-CAPILLARY: 89 mg/dL (ref 65–99)
GLUCOSE-CAPILLARY: 162 mg/dL — AB (ref 65–99)
Glucose-Capillary: 82 mg/dL (ref 65–99)

## 2017-07-11 LAB — ECHOCARDIOGRAM COMPLETE
AOASC: 37 cm
CHL CUP MV DEC (S): 162
E/e' ratio: 10.83
EWDT: 162 ms
FS: 32 % (ref 28–44)
HEIGHTINCHES: 67 in
IVS/LV PW RATIO, ED: 1.36
LA diam end sys: 34 mm
LA vol A4C: 39.7 ml
LADIAMINDEX: 1.55 cm/m2
LASIZE: 34 mm
LAVOL: 39.4 mL
LAVOLIN: 17.9 mL/m2
LV TDI E'LATERAL: 7.62
LV e' LATERAL: 7.62 cm/s
LVEEAVG: 10.83
LVEEMED: 10.83
LVOT area: 3.8 cm2
LVOTD: 22 mm
Lateral S' vel: 11.3 cm/s
MV Peak grad: 3 mmHg
MV pk E vel: 82.5 m/s
MVPKAVEL: 68.9 m/s
PW: 9.38 mm — AB (ref 0.6–1.1)
TAPSE: 18.5 mm
TDI e' medial: 5.44
WEIGHTICAEL: 3486.4 [oz_av]

## 2017-07-11 LAB — COMPREHENSIVE METABOLIC PANEL
ALT: 49 U/L (ref 14–54)
ANION GAP: 8 (ref 5–15)
AST: 30 U/L (ref 15–41)
Albumin: 2.4 g/dL — ABNORMAL LOW (ref 3.5–5.0)
Alkaline Phosphatase: 80 U/L (ref 38–126)
BILIRUBIN TOTAL: 0.6 mg/dL (ref 0.3–1.2)
BUN: 33 mg/dL — ABNORMAL HIGH (ref 6–20)
CHLORIDE: 107 mmol/L (ref 101–111)
CO2: 24 mmol/L (ref 22–32)
Calcium: 8.3 mg/dL — ABNORMAL LOW (ref 8.9–10.3)
Creatinine, Ser: 1.37 mg/dL — ABNORMAL HIGH (ref 0.44–1.00)
GFR, EST AFRICAN AMERICAN: 41 mL/min — AB (ref >60)
GFR, EST NON AFRICAN AMERICAN: 36 mL/min — AB (ref >60)
Glucose, Bld: 182 mg/dL — ABNORMAL HIGH (ref 65–99)
POTASSIUM: 4 mmol/L (ref 3.5–5.1)
Sodium: 139 mmol/L (ref 135–145)
TOTAL PROTEIN: 5.3 g/dL — AB (ref 6.5–8.1)

## 2017-07-11 LAB — LACTATE DEHYDROGENASE: LDH: 180 U/L (ref 98–192)

## 2017-07-11 LAB — TROPONIN I: Troponin I: <0.03 ng/mL (ref <0.03)

## 2017-07-11 LAB — PROTEIN, TOTAL: Total Protein: 5.4 g/dL — ABNORMAL LOW (ref 6.5–8.1)

## 2017-07-11 LAB — CHOLESTEROL, TOTAL: CHOLESTEROL: 106 mg/dL (ref 0–200)

## 2017-07-11 LAB — HIV ANTIBODY (ROUTINE TESTING W REFLEX): HIV Screen 4th Generation wRfx: NONREACTIVE

## 2017-07-11 MED ORDER — ASPIRIN EC 81 MG PO TBEC
81.0000 mg | DELAYED_RELEASE_TABLET | Freq: Every day | ORAL | Status: DC
  Administered 2017-07-11 – 2017-07-12 (×2): 81 mg via ORAL
  Filled 2017-07-11 (×2): qty 1

## 2017-07-11 MED ORDER — LEVOFLOXACIN IN D5W 750 MG/150ML IV SOLN
750.0000 mg | INTRAVENOUS | Status: DC
  Administered 2017-07-12: 750 mg via INTRAVENOUS
  Filled 2017-07-11: qty 150

## 2017-07-11 MED ORDER — METOPROLOL TARTRATE 50 MG PO TABS
50.0000 mg | ORAL_TABLET | Freq: Two times a day (BID) | ORAL | Status: DC
  Administered 2017-07-11 – 2017-07-12 (×2): 50 mg via ORAL
  Filled 2017-07-11 (×2): qty 1

## 2017-07-11 MED ORDER — ACETAMINOPHEN 325 MG PO TABS: 650.0000 mg | ORAL_TABLET | Freq: Four times a day (QID) | ORAL | Status: DC | PRN

## 2017-07-11 MED ORDER — ONDANSETRON HCL 4 MG/2ML IJ SOLN: 4.0000 mg | Freq: Four times a day (QID) | INTRAMUSCULAR | Status: DC | PRN

## 2017-07-11 NOTE — Progress Notes (Signed)
Pharmacy Antibiotic Note  Michaela Rhodes is a 81 y.o. female admitted on 07/10/2017 with pneumonia.  Pharmacy has been consulted for Levaquin dosing. Day 2 ABX.  Patient afebrile, WBC 16.2>20.3. Decline in renal function, Scr 1>1.37  Levaquin 750 mg IV given in ED 7/23 @ 1400. Changing Levaquin frequency from 24 to 48 hours for renal function.  Plan: Levaquin 750 mg IV q48hrs. Monitor clinic progress, renal function, electrolytes F/u BCx, C/S, future de-escalation, & length of therapy  Height: 5\' 7"  (170.2 cm) Weight: 217 lb 14.4 oz (98.8 kg) IBW/kg (Calculated) : 61.6  Temp (24hrs), Avg:98.2 F (36.8 C), Min:97.7 F (36.5 C), Max:98.6 F (37 C)   Recent Labs Lab 07/10/17 1239 07/10/17 1459 07/10/17 1627 07/10/17 2006 07/11/17 0639  WBC  --  16.2*  --   --  20.3*  CREATININE 1.00  --   --   --  1.37*  LATICACIDVEN  --   --  2.70* 2.0*  --     Estimated Creatinine Clearance: 40.2 mL/min (A) (by C-G formula based on SCr of 1.37 mg/dL (H)).    Allergies  Allergen Reactions  . Penicillins Anaphylaxis and Hives    Has patient had a PCN reaction causing immediate rash, facial/tongue/throat swelling, SOB or lightheadedness with hypotension: Yes Has patient had a PCN reaction causing severe rash involving mucus membranes or skin necrosis: Yes Has patient had a PCN reaction that required hospitalization: No Has patient had a PCN reaction occurring within the last 10 years: Yes If all of the above answers are "NO", then may proceed with Cephalosporin use.   Marland Kitchen Keflex [Cephalexin]     Rash     Antimicrobials this admission:  Vancomycin x 1 on 7/23  Levaquin 7/23>>  Dose adjustments this admission:  n/a  Microbiology results:  7/23 blood x 2 -  7/23 sputum -  7/23 Strep pneum Ag -  7/23 Legionella Ag -  Thank you for allowing pharmacy to be a part of this patient's care.  Nida Boatman, PharmD PGY1 Acute Care Pharmacy Resident Pager: [phone] 07/11/2017 11:44  AM

## 2017-07-11 NOTE — Progress Notes (Signed)
Name: Michaela Rhodes MRN: [id number] DOB: 02-18-1936    ADMISSION DATE:  07/10/2017 CONSULTATION DATE:  07/10/17  REFERRING MD :  07/10/17  CHIEF COMPLAINT:  SOB   HISTORY OF PRESENT ILLNESS:  Michaela Rhodes is a 81 y.o. female with a PMH as outlined below.  She presented to [facility] ED 7/23 with SOB that began 3 days prior.  She had also had chest, nasal congestion, and cough for 1 week. On 7/23, EMS was called to her home and upon their arrival, she was found to be in SVT with rate of 175.  In ED, CXR demonstrated infiltrate at LLL and lingula.  CTA was negative for PE but did demonstrate LLL consolidation with air bronchograms, moderate to large left sided effusion, small right effusion, small to mod pericardial effsuion, subcarinal adenopathy.  Pt was admitted by [facility] and PCCM was asked to see in consultation for pleural effusions.   SUBJECTIVE:  No events overnight  VITAL SIGNS: Temp:  [97.7 F (36.5 C)-98.5 F (36.9 C)] 98 F (36.7 C) (07/24 0806) Pulse Rate:  [84-148] 122 (07/24 0806) Resp:  [16-36] 23 (07/24 0806) BP: (77-146)/(57-97) 117/75 (07/24 0806) SpO2:  [81 %-100 %] 99 % (07/24 0806) Weight:  [216 lb 0.8 oz (98 kg)-217 lb 14.4 oz (98.8 kg)] 217 lb 14.4 oz (98.8 kg) (07/23 1840)  PHYSICAL EXAMINATION: Physical Exam  Constitutional: She is oriented to person, place, and time. She appears well-developed and well-nourished.  HENT:  Head: Normocephalic and atraumatic.  Eyes: Pupils are equal, round, and reactive to light. Conjunctivae and EOM are normal.  Neck: Normal range of motion. Neck supple.  Cardiovascular: Regular rhythm and normal heart sounds.   Pulmonary/Chest: Effort normal and breath sounds normal.  Decreased BS on the left  Abdominal: Soft. Bowel sounds are normal.  Musculoskeletal: Normal range of motion.  Neurological: She is alert and oriented to person, place, and time.  Skin: Skin is warm and dry.  Vitals reviewed.      Recent Labs Lab  07/10/17 1239 07/11/17 0639  NA 137 139  K 4.3 4.0  CL 102 107  CO2  --  24  BUN 34* 33*  CREATININE 1.00 1.37*  GLUCOSE 241* 182*    Recent Labs Lab 07/10/17 1239 07/10/17 1459 07/11/17 0639  HGB 11.6* 13.0 10.9*  HCT 34.0* 39.3 33.8*  WBC  --  16.2* 20.3*  PLT  --  PLATELET CLUMPS NOTED ON SMEAR, COUNT APPEARS INCREASED 436*   Ct Angio Chest Pe W Or Wo Contrast  Result Date: 07/10/2017 CLINICAL DATA:  81 year old female with shortness of breath starting yesterday. Chest cold for the past month. Breast cancer post left mastectomy. Subsequent encounter. EXAM: CT ANGIOGRAPHY CHEST WITH CONTRAST TECHNIQUE: Multidetector CT imaging of the chest was performed using the standard protocol during bolus administration of intravenous contrast. Multiplanar CT image reconstructions and MIPs were obtained to evaluate the vascular anatomy. CONTRAST:  65 cc Isovue 370. COMPARISON:  07/10/2017 and 05/26/2012 chest x-ray. Kyphoplasty 05/10/2017. Abdominal CT 03/15/2012. FINDINGS: Cardiovascular: Small to slightly moderate-size pericardial effusion. Heart slightly enlarged. Coronary artery calcifications. Slight motion degradation without evidence of pulmonary embolus or aortic dissection. Mediastinum/Nodes: Subcarinal adenopathy. Slightly rounded lymph nodes preaortic region. Small hiatal hernia. Lungs/Pleura: Left lower lobe consolidation with air bronchograms. Slight narrowing of the left lower lobe bronchus without obstructing mass identified. Moderately large left-sided pleural effusion. Small right-sided pleural effusion and right base atelectasis. Upper Abdomen: Renal cysts and parapelvic cyst unchanged. Mild prominence adrenal  glands minimally more prominent than on the prior exam. Musculoskeletal: Post cement augmentation T12/L1 compression fracture with kyphosis centered at this level. Remote appearing T5 superior endplate compression fracture/ Schmorl's node deformity. Post right shoulder  replacement. Post left mastectomy with reconstruction. Small amount of fluid surrounding the left breast implants. Review of the MIP images confirms the above findings. IMPRESSION: Post left mastectomy with reconstruction. Slight motion degradation without evidence of pulmonary embolus or aortic dissection. Left lower lobe consolidation with air bronchograms. Slight narrowing of the left lower lobe bronchus without obstructing mass identified. Moderately large left-sided pleural effusion. Small right-sided pleural effusion and right base atelectasis. Small to slightly moderate-size pericardial effusion. Subcarinal adenopathy and slightly rounded lymph nodes preaortic region. Findings may reflect result of infectious process. Malignant process a secondary consideration. Although there is mild pulmonary vascular congestion, appearance is not typical for pulmonary edema. Mild prominence adrenal glands minimally more prominent than on prior exam. Coronary artery calcifications. Aortic Atherosclerosis (ICD10-I70.0). Electronically Signed   By: Genia Del M.D.   On: 07/10/2017 17:14   Portable Chest 1 View  Result Date: 07/11/2017 CLINICAL DATA:  Community-acquired pneumonia EXAM: PORTABLE CHEST 1 VIEW COMPARISON:  Chest x-ray and chest CT scan of July 10, 2017 FINDINGS: The right lung is adequately inflated and clear. On the left there is persistent volume loss with parenchymal consolidation as well as pleural fluid in the mid and lower lung. There is no pneumothorax. The cardiac silhouette is mildly enlarged but stable. The pulmonary vascularity is not clearly engorged. There surgical clips in the left axillary region. There is calcification in the wall of the aortic arch. There is a prosthetic reverse right shoulder joint. IMPRESSION: Persistent infiltrate and pleural effusion on the left. Overall there may been slight interval worsening in the appearance of the left lung. Clear right lung. No pulmonary edema.  Electronically Signed   By: David  Martinique M.D.   On: 07/11/2017 07:28   Dg Chest Port 1 View  Result Date: 07/10/2017 CLINICAL DATA:  Acute onset of cough and shortness of breath which began several days ago. EXAM: PORTABLE CHEST 1 VIEW COMPARISON:  05/26/2012. FINDINGS: External pacing pads are present. Cardiac silhouette markedly enlarged, increased in size since 2013. Thoracic aorta atherosclerotic, unchanged. Hilar and mediastinal contours otherwise unremarkable. Consolidation involving the left lower lobe and lingula silhouetting the left hemidiaphragm and the left heart border. Pulmonary vascularity normal without evidence of pulmonary edema. Surgical clips in the left chest wall and left axilla related to prior left mastectomy, axillary node dissection and reconstruction. Prior right shoulder arthroplasty. IMPRESSION: 1. Atelectasis and/or pneumonia involving the left lower lobe and lingula. 2. Marked cardiomegaly, with interval increase in heart size since 2013. No evidence of pulmonary edema. 3. Thoracic aortic atherosclerosis. Electronically Signed   By: Evangeline Dakin M.D.   On: 07/10/2017 12:17    STUDIES:  CXR 7/23 > infiltrate at LLL and lingula.  CTA 7/23 > negative for PE but did demonstrate LLL consolidation with air bronchograms, moderate to large left sided effusion, small right effusion, small to mod pericardial effsuion, subcarinal adenopathy. Echo 7/23 >   SIGNIFICANT EVENTS  7/23 > admit.  ANTIBIOTICS: Levaquin 7/23 >   CULTURES: Blood 7/2 >  Sputum 7/2 >   ASSESSMENT / PLAN:  CAP (NOS). Pleural effusions L > R. Acute hypoxic respiratory failure - due to above. pcxr w/ persistent L>R airspace disease and effusion (on left) Plan: Cont empiric levaquin  Follow cultures Cont IS Therapeutic and  diagnostic thora today    Tachycardia - ? SVT vs A.fib.  EKG of poor quality likely due to motion. Pericardial effusion. Hx HTN, HLD. Plan: F/u echo Cont tele   Holding ac   DM. Plan: SSI  Rest per primary team.  Salvadore Dom, NP  Attending Note:  81 year old female with PMH above presenting to PCCM with SOB, hypoxemia, pleural effusion and pulmonary edema.  On exam, decreased BS on the left.  I reviewed chest CT myself, left sided pleural effusion noted.  Discussed with PCCM-NP.  SOB: due to CAP  - Titrate O2 for sat of 88-92%  - May need an ambulatory desat study prior to discharge for ?of home O2  Pleural effusion:  - Thora today  - Fluid analysis.  CAP:  - Levaquin  - F/U on cultures  Pulmonary edema: due to arrhythmia  - Keep fluid even at this point  - 2D echo  - Tele monitoring  PCCM will follow  Patient seen and examined, agree with above note.  I dictated the care and orders written for this patient under my direction.  Rush Farmer, [place]

## 2017-07-11 NOTE — Progress Notes (Signed)
  Echocardiogram 2D Echocardiogram has been performed.  Michaela Rhodes 07/11/2017, 12:16 PM

## 2017-07-11 NOTE — Procedures (Signed)
Thoracentesis Procedure Note  Pre-operative Diagnosis: Pleural effusion  Post-operative Diagnosis: same  Indications: Left sided pleural effusion  Procedure Details  Consent: Informed consent was obtained. Risks of the procedure were discussed including: infection, bleeding, pain, pneumothorax.  Under sterile conditions the patient was positioned. Betadine solution and sterile drapes were utilized.  1% buffered lidocaine was used to anesthetize the 6 rib space. Fluid was obtained without any difficulties and minimal blood loss.  A dressing was applied to the wound and wound care instructions were provided.   Findings 1200 ml of clear pleural fluid was obtained. A sample was sent to Pathology for cytogenetics, flow, and cell counts, as well as for infection analysis.  Complications:  None; patient tolerated the procedure well.          Condition: stable  Plan A follow up chest x-ray was ordered. Bed Rest for 0 hours. Tylenol 650 mg. for pain.  Attending Attestation: I was present and scrubbed for the entire procedure.  Rush Farmer, M.D. [facility] Pulmonary/Critical Care Medicine. Pager: [phone]. After hours pager: [phone].

## 2017-07-11 NOTE — Progress Notes (Signed)
[facility] Progress Note  Patient: Michaela Rhodes ERX:[id number]   PCP: [name], MD DOB: 1936/09/27   DOA: 07/10/2017   DOS: 07/11/2017   Date of Service: the patient was seen and examined on 07/11/2017  Subjective: Feeling better, breathing is better. No chest pain. Some cough. No nausea no vomiting.  Brief hospital course: Pt. with PMH of type II DM, dyslipidemia, breast cancer, HTN, hypothyroidism, SVT; admitted on 07/10/2017, presented with complaint of shortnes of breath, was found to have acute hypoxic respiratory failure secondary to left pleural effusion and community acquired pneumonia. She also had an event with flash pulmonary edema in the ER Currently further plan is continue current management.  Assessment and Plan: 1. Acute hypoxic respiratory failure. Community acquired pneumonia left lobar. Left-sided pleural effusion. Most likely infectious process although malignancy cannot be excluded. Thoracentesis performed, 1.2 L of clear fluid removed. Workup currently pending. On room air. CT angiogram of chest is negative for pulmonary embolism. Pulmonary continued to follow. Patient started on IV antibiotics vancomycin and Levaquin, currently on Levaquin orally. Monitor cultures. Follow up on repeat x-ray after thoracentesis.  2. SVT. Patient has recurrent SVTs and has actually been on Lopressor for the same. Presented with heart rate of 170 in the ER. Adenosine was given. Continue to monitor at present. Mild elevation of troponin 1 was likely demand ischemia repeat troponin negative.  3. Acute on chronic combined CHF. Echo shows EF of 45-50%. No wall motion abnormalities. Grade 2 diastolic dysfunction. Patient was given IV Lasix 40 mg 1. Currently due to function mildly elevated since presentation. Will monitor without diuresis. Daily weight and ins and outs. No evidence of pericardial effusion on echocardiogram.  4. Hypothyroidism. Synthroid  continue. TSH stable.  5. Type 2 diabetes mellitus. Holding oral hypoglycemic agent. Placing the patient sliding scale insulin. Check hemoglobin A1c tomorrow.  Diet: cardiac diet DVT Prophylaxis: subcutaneous Heparin  Advance goals of care discussion: full code  Family Communication: no family was present at bedside, at the time of interview.   Disposition:  Discharge to home.  Consultants: PCCM Procedures: thoracentesis, Echocardiogram   Antibiotics: Anti-infectives    Start     Dose/Rate Route Frequency Ordered Stop   07/12/17 1400  levofloxacin (LEVAQUIN) IVPB 750 mg     750 mg 100 mL/hr over 90 Minutes Intravenous Every 48 hours 07/11/17 1143     07/11/17 1400  levofloxacin (LEVAQUIN) IVPB 750 mg  Status:  Discontinued     750 mg 100 mL/hr over 90 Minutes Intravenous Every 24 hours 07/10/17 1900 07/11/17 1143   07/11/17 0300  vancomycin (VANCOCIN) IVPB 750 mg/150 ml premix  Status:  Discontinued     750 mg 150 mL/hr over 60 Minutes Intravenous Every 12 hours 07/10/17 1332 07/10/17 1849   07/10/17 1345  vancomycin (VANCOCIN) 2,000 mg in sodium chloride 0.9 % 500 mL IVPB     2,000 mg 250 mL/hr over 120 Minutes Intravenous  Once 07/10/17 1330 07/10/17 1655   07/10/17 1330  levofloxacin (LEVAQUIN) IVPB 750 mg     750 mg 100 mL/hr over 90 Minutes Intravenous  Once 07/10/17 1327 07/10/17 1604       Objective: Physical Exam: Vitals:   07/11/17 1000 07/11/17 1100 07/11/17 1127 07/11/17 1200  BP: 120/75 114/76 125/78 118/72  Pulse: 91 83 83 85  Resp: 20 (!) 21 (!) 22 (!) 22  Temp:   98.6 F (37 C)   TempSrc:   Oral   SpO2: 97% 97% 97%  97%  Weight:      Height:        Intake/Output Summary (Last 24 hours) at 07/11/17 1515 Last data filed at 07/10/17 1802  Gross per 24 hour  Intake          1680.57 ml  Output                0 ml  Net          1680.57 ml   Filed Weights   07/10/17 1300 07/10/17 1840  Weight: 98 kg (216 lb 0.8 oz) 98.8 kg (217 lb 14.4 oz)    General: Alert, Awake and Oriented to Time, Place and Person. Appear in moderate distress, affect appropriate Eyes: PERRL, Conjunctiva normal ENT: Oral Mucosa clear moist. Neck: difficult to assess JVD, no Abnormal Mass Or lumps Cardiovascular: S1 and S2 Present, *no Murmur, Peripheral Pulses Present Respiratory: normal respiratory effort, Bilateral Air entry equal and Decreased, no use of accessory muscle, bilateral Crackles, no wheezes Abdomen: Bowel Sound present, Soft and no tenderness, no hernia Skin: no redness, no Rash, no induration Extremities: no Pedal edema, no calf tenderness Neurologic: Grossly no focal neuro deficit. Bilaterally Equal motor strength  Data Reviewed: CBC:  Recent Labs Lab 07/10/17 1239 07/10/17 1459 07/11/17 0639  WBC  --  16.2* 20.3*  NEUTROABS  --  13.6*  --   HGB 11.6* 13.0 10.9*  HCT 34.0* 39.3 33.8*  MCV  --  83.6 83.0  PLT  --  PLATELET CLUMPS NOTED ON SMEAR, COUNT APPEARS INCREASED 163*   Basic Metabolic Panel:  Recent Labs Lab 07/10/17 1239 07/11/17 0639  NA 137 139  K 4.3 4.0  CL 102 107  CO2  --  24  GLUCOSE 241* 182*  BUN 34* 33*  CREATININE 1.00 1.37*  CALCIUM  --  8.3*    Liver Function Tests:  Recent Labs Lab 07/11/17 0639  AST 30  ALT 49  ALKPHOS 80  BILITOT 0.6  PROT 5.3*  ALBUMIN 2.4*   No results for input(s): LIPASE, AMYLASE in the last 168 hours. No results for input(s): AMMONIA in the last 168 hours. Coagulation Profile: No results for input(s): INR, PROTIME in the last 168 hours. Cardiac Enzymes:  Recent Labs Lab 07/10/17 2006 07/11/17 0008  TROPONINI <0.03 <0.03   BNP (last 3 results) No results for input(s): PROBNP in the last 8760 hours. CBG:  Recent Labs Lab 07/10/17 1900 07/10/17 2059 07/11/17 0635 07/11/17 1125  GLUCAP 266* 324* 177* 82   Studies: Ct Angio Chest Pe W Or Wo Contrast  Result Date: 07/10/2017 CLINICAL DATA:  81 year old female with shortness of breath starting  yesterday. Chest cold for the past month. Breast cancer post left mastectomy. Subsequent encounter. EXAM: CT ANGIOGRAPHY CHEST WITH CONTRAST TECHNIQUE: Multidetector CT imaging of the chest was performed using the standard protocol during bolus administration of intravenous contrast. Multiplanar CT image reconstructions and MIPs were obtained to evaluate the vascular anatomy. CONTRAST:  65 cc Isovue 370. COMPARISON:  07/10/2017 and 05/26/2012 chest x-ray. Kyphoplasty 05/10/2017. Abdominal CT 03/15/2012. FINDINGS: Cardiovascular: Small to slightly moderate-size pericardial effusion. Heart slightly enlarged. Coronary artery calcifications. Slight motion degradation without evidence of pulmonary embolus or aortic dissection. Mediastinum/Nodes: Subcarinal adenopathy. Slightly rounded lymph nodes preaortic region. Small hiatal hernia. Lungs/Pleura: Left lower lobe consolidation with air bronchograms. Slight narrowing of the left lower lobe bronchus without obstructing mass identified. Moderately large left-sided pleural effusion. Small right-sided pleural effusion and right base atelectasis. Upper Abdomen: Renal cysts  and parapelvic cyst unchanged. Mild prominence adrenal glands minimally more prominent than on the prior exam. Musculoskeletal: Post cement augmentation T12/L1 compression fracture with kyphosis centered at this level. Remote appearing T5 superior endplate compression fracture/ Schmorl's node deformity. Post right shoulder replacement. Post left mastectomy with reconstruction. Small amount of fluid surrounding the left breast implants. Review of the MIP images confirms the above findings. IMPRESSION: Post left mastectomy with reconstruction. Slight motion degradation without evidence of pulmonary embolus or aortic dissection. Left lower lobe consolidation with air bronchograms. Slight narrowing of the left lower lobe bronchus without obstructing mass identified. Moderately large left-sided pleural effusion.  Small right-sided pleural effusion and right base atelectasis. Small to slightly moderate-size pericardial effusion. Subcarinal adenopathy and slightly rounded lymph nodes preaortic region. Findings may reflect result of infectious process. Malignant process a secondary consideration. Although there is mild pulmonary vascular congestion, appearance is not typical for pulmonary edema. Mild prominence adrenal glands minimally more prominent than on prior exam. Coronary artery calcifications. Aortic Atherosclerosis (ICD10-I70.0). Electronically Signed   By: Genia Del M.D.   On: 07/10/2017 17:14   Dg Chest Port 1 View  Result Date: 07/11/2017 CLINICAL DATA:  History of left sided thoracentesis EXAM: PORTABLE CHEST 1 VIEW COMPARISON:  Portable chest x-ray of 07/11/2017 FINDINGS: Much of the left pleural effusion has been evacuated. No definite pneumothorax is seen. Mild basilar atelectasis is present. Cardiomegaly is stable. Right reverse shoulder replacement is noted. IMPRESSION: 1. Much of the left pleural effusion has been evacuated after left thoracentesis. No definite left pneumothorax. 2. Mild bibasilar linear atelectasis. Electronically Signed   By: Ivar Drape M.D.   On: 07/11/2017 14:33   Portable Chest 1 View  Result Date: 07/11/2017 CLINICAL DATA:  Community-acquired pneumonia EXAM: PORTABLE CHEST 1 VIEW COMPARISON:  Chest x-ray and chest CT scan of July 10, 2017 FINDINGS: The right lung is adequately inflated and clear. On the left there is persistent volume loss with parenchymal consolidation as well as pleural fluid in the mid and lower lung. There is no pneumothorax. The cardiac silhouette is mildly enlarged but stable. The pulmonary vascularity is not clearly engorged. There surgical clips in the left axillary region. There is calcification in the wall of the aortic arch. There is a prosthetic reverse right shoulder joint. IMPRESSION: Persistent infiltrate and pleural effusion on the left.  Overall there may been slight interval worsening in the appearance of the left lung. Clear right lung. No pulmonary edema. Electronically Signed   By: David  Martinique M.D.   On: 07/11/2017 07:28    Scheduled Meds: . insulin aspart  0-5 Units Subcutaneous QHS  . insulin aspart  0-9 Units Subcutaneous TID WC  . levothyroxine  137 mcg Oral QAC breakfast  . pravastatin  40 mg Oral Daily   Continuous Infusions: . [START ON 07/12/2017] levofloxacin (LEVAQUIN) IV     PRN Meds: acetaminophen, metoprolol tartrate, ondansetron (ZOFRAN) IV  Time spent: 35 minutes  Author: Berle Mull, MD [facility] Pager: [phone] 07/11/2017 3:15 PM  If 7PM-7AM, please contact night-coverage at [email or web address], password [facility]

## 2017-07-11 NOTE — Progress Notes (Signed)
Inpatient Diabetes Program Recommendations  AACE/ADA: New Consensus Statement on Inpatient Glycemic Control (2015)  Target Ranges:  Prepandial:   less than 140 mg/dL      Peak postprandial:   less than 180 mg/dL (1-2 hours)      Critically ill patients:  140 - 180 mg/dL   Lab Results  Component Value Date   GLUCAP 177 (H) 07/11/2017   HGBA1C 8.3 04/05/2017    Review of Glycemic Control:  Results for Michaela Rhodes, Michaela Rhodes (MRN [id number]) as of 07/11/2017 11:20  Ref. Range 07/10/2017 19:00 07/10/2017 20:59 07/11/2017 06:35  Glucose-Capillary Latest Ref Range: 65 - 99 mg/dL 266 (H) 324 (H) 177 (H)   Diabetes history: Type 2 diabetes Outpatient Diabetes medications: Metformin 750 mg bid Current orders for Inpatient glycemic control:  Novolog sensitive tid with meals and HS  Inpatient Diabetes Program Recommendations:    Please consider adding Lantus 18 units daily daily while patient is in the hospital.   Thanks, Adah Perl, RN, BC-ADM Inpatient Diabetes Coordinator Pager [phone] (8a-5p)

## 2017-07-12 ENCOUNTER — Encounter (HOSPITAL_COMMUNITY): Payer: Self-pay

## 2017-07-12 DIAGNOSIS — R0602 Shortness of breath: Secondary | ICD-10-CM

## 2017-07-12 DIAGNOSIS — E785 Hyperlipidemia, unspecified: Secondary | ICD-10-CM

## 2017-07-12 DIAGNOSIS — I10 Essential (primary) hypertension: Secondary | ICD-10-CM

## 2017-07-12 DIAGNOSIS — I471 Supraventricular tachycardia: Secondary | ICD-10-CM

## 2017-07-12 DIAGNOSIS — E872 Acidosis: Secondary | ICD-10-CM

## 2017-07-12 DIAGNOSIS — J810 Acute pulmonary edema: Secondary | ICD-10-CM

## 2017-07-12 DIAGNOSIS — E039 Hypothyroidism, unspecified: Secondary | ICD-10-CM

## 2017-07-12 DIAGNOSIS — A419 Sepsis, unspecified organism: Principal | ICD-10-CM

## 2017-07-12 DIAGNOSIS — I5031 Acute diastolic (congestive) heart failure: Secondary | ICD-10-CM

## 2017-07-12 DIAGNOSIS — Z9889 Other specified postprocedural states: Secondary | ICD-10-CM

## 2017-07-12 DIAGNOSIS — N179 Acute kidney failure, unspecified: Secondary | ICD-10-CM

## 2017-07-12 LAB — COMPREHENSIVE METABOLIC PANEL
ALBUMIN: 2.3 g/dL — AB (ref 3.5–5.0)
ALK PHOS: 74 U/L (ref 38–126)
ALT: 42 U/L (ref 14–54)
AST: 29 U/L (ref 15–41)
Anion gap: 6 (ref 5–15)
BUN: 25 mg/dL — AB (ref 6–20)
CALCIUM: 8.3 mg/dL — AB (ref 8.9–10.3)
CHLORIDE: 107 mmol/L (ref 101–111)
CO2: 24 mmol/L (ref 22–32)
CREATININE: 1.17 mg/dL — AB (ref 0.44–1.00)
GFR calc Af Amer: 50 mL/min — ABNORMAL LOW (ref >60)
GFR calc non Af Amer: 43 mL/min — ABNORMAL LOW (ref >60)
GLUCOSE: 176 mg/dL — AB (ref 65–99)
Potassium: 3.8 mmol/L (ref 3.5–5.1)
SODIUM: 137 mmol/L (ref 135–145)
Total Bilirubin: 0.3 mg/dL (ref 0.3–1.2)
Total Protein: 5.2 g/dL — ABNORMAL LOW (ref 6.5–8.1)

## 2017-07-12 LAB — GLUCOSE, CAPILLARY
GLUCOSE-CAPILLARY: 204 mg/dL — AB (ref 65–99)
Glucose-Capillary: 177 mg/dL — ABNORMAL HIGH (ref 65–99)
Glucose-Capillary: 192 mg/dL — ABNORMAL HIGH (ref 65–99)

## 2017-07-12 LAB — MAGNESIUM: Magnesium: 1.6 mg/dL — ABNORMAL LOW (ref 1.7–2.4)

## 2017-07-12 LAB — LACTATE DEHYDROGENASE, PLEURAL OR PERITONEAL FLUID: LD, Fluid: 94 U/L — ABNORMAL HIGH (ref 3–23)

## 2017-07-12 LAB — PROTEIN, PLEURAL OR PERITONEAL FLUID: TOTAL PROTEIN, FLUID: 3.3 g/dL

## 2017-07-12 MED ORDER — IPRATROPIUM-ALBUTEROL 20-100 MCG/ACT IN AERS: 1.0000 | INHALATION_SPRAY | Freq: Four times a day (QID) | RESPIRATORY_TRACT | 0 refills | Status: DC | PRN

## 2017-07-12 MED ORDER — LEVOFLOXACIN 750 MG PO TABS: 750.0000 mg | ORAL_TABLET | Freq: Every day | ORAL | 0 refills | Status: AC

## 2017-07-12 MED ORDER — METOPROLOL TARTRATE 50 MG PO TABS: 75.0000 mg | ORAL_TABLET | Freq: Two times a day (BID) | ORAL | 1 refills | Status: DC

## 2017-07-12 MED ORDER — ASPIRIN 81 MG PO TBEC: 81.0000 mg | DELAYED_RELEASE_TABLET | Freq: Every day | ORAL | Status: DC

## 2017-07-12 NOTE — Progress Notes (Signed)
LDH and protein back.  92/180 for LDH and 3.3/5.2 protein.  Transudative by LDH but exudative by protein.  This indicates transudative fluid that has been diuresed as consistent by history.  The patient appears non-toxic making empyema much less likely.  Discussed with Dr. Dyann Kief, ok to discharge with f/u CXR in 1-2 wks with primary.  If effusion recurs may see pulmonary as outpatient.  Total levaquin days of 8.  Rush Farmer, M.D. [facility] Pulmonary/Critical Care Medicine. Pager: [phone]. After hours pager: [phone].

## 2017-07-12 NOTE — Discharge Summary (Signed)
Physician Discharge Summary  Michaela Rhodes EUM:[id number] DOB: [birth year] DOA: 07/10/2017  PCP: [name], MD  Admit date: 07/10/2017 Discharge date: 07/12/2017  Time spent: 35 minutes  Recommendations for Outpatient Follow-up:  1. Repeat BMET to follow electrolytes and renal function  2. Follow A1C, pending at discharge 3. Repeat CXR in 2-3 weeks to follow resolution of infiltrates and assure no re-accumulation of pleural effusion.   Discharge Diagnoses:  Principal Problem:   Sepsis, unspecified organism [facility]) Active Problems:   Hypothyroidism   SVT (supraventricular tachycardia) (HCC)   Hyperlipidemia   CAP (community acquired pneumonia)   Acute respiratory failure ([place])   Diabetes mellitus with complication (HCC)   Pleural effusion on left   Lactic acidemia   S/P thoracentesis   Acute diastolic CHF (congestive heart failure) ([place])   AKI (acute kidney injury) ([place])   Discharge Condition: stable and improved. Patient discharge home with instructions to follow up with PCP in 10 days.  Diet recommendation: modified carbohydrates and low sodium diet   Filed Weights   07/10/17 1300 07/10/17 1840  Weight: 98 kg (216 lb 0.8 oz) 98.8 kg (217 lb 14.4 oz)    History of present illness:  As per H&P written by Dr. Marily Memos on 07/10/17 81 y.o. female with medical history significant of diabetes, hyper lipidemia, breast cancer status post left mastectomy and reconstruction, hypertension, hypothyroidism, panic attacks, SVT. Patient presented with a primary complaint of feeling short of breath. Found to have a heart rate in the 170s when EMS was called to her house. Patient reports approximately 2 week history of URI type symptoms including nasal and sinus congestion, cough and intermittent fevers. Patient went through a period of approximately 10 days of symptoms will and was gradually improving. However the last 2-3 days patient began to become worse again. Patient denies actual  chest pain, palpitations, lower extremity swelling, abdominal pain, dysuria, frequency, neck stiffness, headache, focal neurological deficits.  At time of my exam patient developed acute respiratory failure. This happened within seconds. Patient was in a state described above when she had a coughing episode and then grabbed her chest. She turned very red and looked to struggle to breathe. She explained that she felt extremely short of breath, and was unable to get enough air. This is associated with a feeling of chest pain. At this time O2 saturations dropped into the 50s with intermittent waveform on pulse ox. With good pulse ox waveform O2 saturations quickly rose to the low 80s. Prior to this patient has not been hypoxic. This lasted several minutes. At this time I ordered heparin to be initiated for concern of a pulmonary embolus and ordered a stat CTA chest. Approximately 30 minutes later nursing staff called me again to bedside for patient now appeared very pale and ill appearing. Patient required nonrebreather mask and had an ABG drawn. Additional labs and orders were placed.  CTA was reviewed personally by me and reviewed with the patient. I discussed case with CCM who agreed to see the patient consultation for thoracentesis and consultation/admission if patient decompensates.  Hospital Course:  1-acute resp failure with hypoxia: due to community acquired PNA. -patient breathing much better and currently with good O2 sat on RA -thoracentesis demonstrated transudate -after discussing with PCCM and following patient improvement, will discharge on levaquin PO and complete a total of 8 days. -repeat CXR in 3 weeks as an outpatient -patient CTA neg for PE -no growth on her blood cultures  2-sepsis: meeting criteria  on admission  -now with features resolved -will discharge home on oral antibiotics as mentioned above -patient advise to maintain adequate hydration   3-SVT: with prior hx of  it -will discharge on adjusted dose of metoprolol -HR is stable and controlled at discharge  4-acute on chronic systolic and diastolic HF -compensated  -echo with EF 45-50% and grade 2 diastolic dysfunction  -follow daily weight and low sodium diet -continue b-blocker and resume lisinopril   5-hypothyroidism  -continue TSH stable  6-Type 2 diabetes  -will resume home hypoglycemic regimen  -A1C pending at discharge  7-HLD: -statins   Procedures:  Thoracentesis: 07/11/17 -demonstrating to be a transudate fluid that has been diuresed; most likely from CHF.   Consultations:  PCCM  Discharge Exam: Vitals:   07/12/17 1200 07/12/17 1207  BP: 119/71 135/83  Pulse: 85   Resp: (!) 23   Temp:  98.5 F (36.9 C)    General: afebrile, no requiring oxygen supplementation and feeling much better overall. Patient still with mild pleuritic CP, but feeling good and looking to go home.. Cardiovascular: S1 and S2 appreciated, no rubs, no gallops, no murmurs  Respiratory: very mild rhonchi at left lung base; otherwise clear and with improved air movement. Abd: soft, NT, ND, positive BS Extremities: no edema, no cyanosis, no clubbing   Discharge Instructions   Discharge Instructions    (HEART FAILURE PATIENTS) Call MD:  Anytime you have any of the following symptoms: 1) 3 pound weight gain in 24 hours or 5 pounds in 1 week 2) shortness of breath, with or without a dry hacking cough 3) swelling in the hands, feet or stomach 4) if you have to sleep on extra pillows at night in order to breathe.    Complete by:  As directed    Diet - low sodium heart healthy    Complete by:  As directed    Discharge instructions    Complete by:  As directed    Take medications as prescribed  Please arrange follow up with PCP in 10 days Antibiotics daily for 5 more days  Follow low sodium diet and check your weight on daily basis  Maintain adequate hydration     Current Discharge Medication List     START taking these medications   Details  aspirin EC 81 MG EC tablet Take 1 tablet (81 mg total) by mouth daily.    Ipratropium-Albuterol (COMBIVENT RESPIMAT) 20-100 MCG/ACT AERS respimat Inhale 1 puff into the lungs every 6 (six) hours as needed for wheezing or shortness of breath. Qty: 4 g, Refills: 0    levofloxacin (LEVAQUIN) 750 MG tablet Take 1 tablet (750 mg total) by mouth daily. Qty: 5 tablet, Refills: 0      CONTINUE these medications which have CHANGED   Details  metoprolol tartrate (LOPRESSOR) 50 MG tablet Take 1.5 tablets (75 mg total) by mouth 2 (two) times daily. Qty: 90 tablet, Refills: 1      CONTINUE these medications which have NOT CHANGED   Details  amLODipine (NORVASC) 5 MG tablet Take 1 tablet (5 mg total) by mouth daily. Qty: 90 tablet, Refills: 1    calcium-vitamin D (OSCAL WITH D) 500-200 MG-UNIT per tablet Take 1 tablet by mouth daily. Qty: 100 tablet, Refills: 2    glimepiride (AMARYL) 2 MG tablet TAKE ONE TABLET BY MOUTH ONCE DAILY WITH  BREAKFAST Qty: 90 tablet, Refills: 2    levothyroxine (SYNTHROID, LEVOTHROID) 137 MCG tablet Take 1 tablet (137 mcg total) by  mouth daily before breakfast. Qty: 90 tablet, Refills: 2    lisinopril-hydrochlorothiazide (PRINZIDE,ZESTORETIC) 20-25 MG tablet Take 1 tablet by mouth daily. Qty: 90 tablet, Refills: 2    metFORMIN (GLUCOPHAGE XR) 750 MG 24 hr tablet Take 1 tablet (750 mg total) by mouth 2 (two) times daily at 10 AM and 5 PM. Qty: 180 tablet, Refills: 2    pravastatin (PRAVACHOL) 40 MG tablet Take 1 tablet (40 mg total) by mouth daily. Qty: 90 tablet, Refills: 2      STOP taking these medications     ibuprofen (ADVIL,MOTRIN) 200 MG tablet      naproxen sodium (ANAPROX) 220 MG tablet        Allergies  Allergen Reactions  . Penicillins Anaphylaxis and Hives    Has patient had a PCN reaction causing immediate rash, facial/tongue/throat swelling, SOB or lightheadedness with hypotension: Yes Has  patient had a PCN reaction causing severe rash involving mucus membranes or skin necrosis: Yes Has patient had a PCN reaction that required hospitalization: No Has patient had a PCN reaction occurring within the last 10 years: Yes If all of the above answers are "NO", then may proceed with Cephalosporin use.   Marland Kitchen Keflex [Cephalexin]     Rash    Follow-up Information    [name], MD. Schedule an appointment as soon as possible for a visit in 10 day(s).   Specialty:  Family Medicine Contact information: [place] [address] [phone]           The results of significant diagnostics from this hospitalization (including imaging, microbiology, ancillary and laboratory) are listed below for reference.    Significant Diagnostic Studies: Ct Angio Chest Pe W Or Wo Contrast  Result Date: 07/10/2017 CLINICAL DATA:  81 year old female with shortness of breath starting yesterday. Chest cold for the past month. Breast cancer post left mastectomy. Subsequent encounter. EXAM: CT ANGIOGRAPHY CHEST WITH CONTRAST TECHNIQUE: Multidetector CT imaging of the chest was performed using the standard protocol during bolus administration of intravenous contrast. Multiplanar CT image reconstructions and MIPs were obtained to evaluate the vascular anatomy. CONTRAST:  65 cc Isovue 370. COMPARISON:  07/10/2017 and 05/26/2012 chest x-ray. Kyphoplasty 05/10/2017. Abdominal CT 03/15/2012. FINDINGS: Cardiovascular: Small to slightly moderate-size pericardial effusion. Heart slightly enlarged. Coronary artery calcifications. Slight motion degradation without evidence of pulmonary embolus or aortic dissection. Mediastinum/Nodes: Subcarinal adenopathy. Slightly rounded lymph nodes preaortic region. Small hiatal hernia. Lungs/Pleura: Left lower lobe consolidation with air bronchograms. Slight narrowing of the left lower lobe bronchus without obstructing mass identified. Moderately large  left-sided pleural effusion. Small right-sided pleural effusion and right base atelectasis. Upper Abdomen: Renal cysts and parapelvic cyst unchanged. Mild prominence adrenal glands minimally more prominent than on the prior exam. Musculoskeletal: Post cement augmentation T12/L1 compression fracture with kyphosis centered at this level. Remote appearing T5 superior endplate compression fracture/ Schmorl's node deformity. Post right shoulder replacement. Post left mastectomy with reconstruction. Small amount of fluid surrounding the left breast implants. Review of the MIP images confirms the above findings. IMPRESSION: Post left mastectomy with reconstruction. Slight motion degradation without evidence of pulmonary embolus or aortic dissection. Left lower lobe consolidation with air bronchograms. Slight narrowing of the left lower lobe bronchus without obstructing mass identified. Moderately large left-sided pleural effusion. Small right-sided pleural effusion and right base atelectasis. Small to slightly moderate-size pericardial effusion. Subcarinal adenopathy and slightly rounded lymph nodes preaortic region. Findings may reflect result of infectious process. Malignant process a secondary consideration. Although  there is mild pulmonary vascular congestion, appearance is not typical for pulmonary edema. Mild prominence adrenal glands minimally more prominent than on prior exam. Coronary artery calcifications. Aortic Atherosclerosis (ICD10-I70.0). Electronically Signed   By: Genia Del M.D.   On: 07/10/2017 17:14   Dg Chest Port 1 View  Result Date: 07/11/2017 CLINICAL DATA:  History of left sided thoracentesis EXAM: PORTABLE CHEST 1 VIEW COMPARISON:  Portable chest x-ray of 07/11/2017 FINDINGS: Much of the left pleural effusion has been evacuated. No definite pneumothorax is seen. Mild basilar atelectasis is present. Cardiomegaly is stable. Right reverse shoulder replacement is noted. IMPRESSION: 1. Much of  the left pleural effusion has been evacuated after left thoracentesis. No definite left pneumothorax. 2. Mild bibasilar linear atelectasis. Electronically Signed   By: Ivar Drape M.D.   On: 07/11/2017 14:33   Portable Chest 1 View  Result Date: 07/11/2017 CLINICAL DATA:  Community-acquired pneumonia EXAM: PORTABLE CHEST 1 VIEW COMPARISON:  Chest x-ray and chest CT scan of July 10, 2017 FINDINGS: The right lung is adequately inflated and clear. On the left there is persistent volume loss with parenchymal consolidation as well as pleural fluid in the mid and lower lung. There is no pneumothorax. The cardiac silhouette is mildly enlarged but stable. The pulmonary vascularity is not clearly engorged. There surgical clips in the left axillary region. There is calcification in the wall of the aortic arch. There is a prosthetic reverse right shoulder joint. IMPRESSION: Persistent infiltrate and pleural effusion on the left. Overall there may been slight interval worsening in the appearance of the left lung. Clear right lung. No pulmonary edema. Electronically Signed   By: David  Martinique M.D.   On: 07/11/2017 07:28   Dg Chest Port 1 View  Result Date: 07/10/2017 CLINICAL DATA:  Acute onset of cough and shortness of breath which began several days ago. EXAM: PORTABLE CHEST 1 VIEW COMPARISON:  05/26/2012. FINDINGS: External pacing pads are present. Cardiac silhouette markedly enlarged, increased in size since 2013. Thoracic aorta atherosclerotic, unchanged. Hilar and mediastinal contours otherwise unremarkable. Consolidation involving the left lower lobe and lingula silhouetting the left hemidiaphragm and the left heart border. Pulmonary vascularity normal without evidence of pulmonary edema. Surgical clips in the left chest wall and left axilla related to prior left mastectomy, axillary node dissection and reconstruction. Prior right shoulder arthroplasty. IMPRESSION: 1. Atelectasis and/or pneumonia involving the  left lower lobe and lingula. 2. Marked cardiomegaly, with interval increase in heart size since 2013. No evidence of pulmonary edema. 3. Thoracic aortic atherosclerosis. Electronically Signed   By: Evangeline Dakin M.D.   On: 07/10/2017 12:17    Microbiology: Recent Results (from the past 240 hour(s))  Blood culture (routine x 2)     Status: None (Preliminary result)   Collection Time: 07/10/17  8:06 PM  Result Value Ref Range Status   Specimen Description BLOOD RIGHT ARM  Final   Special Requests IN PEDIATRIC BOTTLE Blood Culture adequate volume  Final   Culture NO GROWTH 2 DAYS  Final   Report Status PENDING  Incomplete  Blood culture (routine x 2)     Status: None (Preliminary result)   Collection Time: 07/10/17  8:11 PM  Result Value Ref Range Status   Specimen Description BLOOD RIGHT WRIST  Final   Special Requests IN PEDIATRIC BOTTLE Blood Culture adequate volume  Final   Culture NO GROWTH 2 DAYS  Final   Report Status PENDING  Incomplete  Body fluid culture  Status: None (Preliminary result)   Collection Time: 07/11/17  1:37 PM  Result Value Ref Range Status   Specimen Description PLEURAL LEFT  Final   Special Requests Normal  Final   Gram Stain   Final    RARE WBC PRESENT,BOTH PMN AND MONONUCLEAR NO ORGANISMS SEEN    Culture NO GROWTH < 24 HOURS  Final   Report Status PENDING  Incomplete     Labs: Basic Metabolic Panel:  Recent Labs Lab 07/10/17 1239 07/11/17 0639 07/12/17 0346  NA 137 139 137  K 4.3 4.0 3.8  CL 102 107 107  CO2  --  24 24  GLUCOSE 241* 182* 176*  BUN 34* 33* 25*  CREATININE 1.00 1.37* 1.17*  CALCIUM  --  8.3* 8.3*  MG  --   --  1.6*   Liver Function Tests:  Recent Labs Lab 07/11/17 0639 07/11/17 1425 07/12/17 0346  AST 30  --  29  ALT 49  --  42  ALKPHOS 80  --  74  BILITOT 0.6  --  0.3  PROT 5.3* 5.4* 5.2*  ALBUMIN 2.4*  --  2.3*   CBC:  Recent Labs Lab 07/10/17 1239 07/10/17 1459 07/11/17 0639  WBC  --  16.2* 20.3*   NEUTROABS  --  13.6*  --   HGB 11.6* 13.0 10.9*  HCT 34.0* 39.3 33.8*  MCV  --  83.6 83.0  PLT  --  PLATELET CLUMPS NOTED ON SMEAR, COUNT APPEARS INCREASED 436*   Cardiac Enzymes:  Recent Labs Lab 07/10/17 2006 07/11/17 0008  TROPONINI <0.03 <0.03    CBG:  Recent Labs Lab 07/11/17 1125 07/11/17 1641 07/11/17 2218 07/12/17 0642 07/12/17 1206  GLUCAP 82 89 162* 177* 204*    Signed:  Barton Dubois MD.  [facility] 07/12/2017, 4:16 PM

## 2017-07-12 NOTE — Progress Notes (Signed)
Name: Michaela Rhodes MRN: [id number] DOB: [birth year]    ADMISSION DATE:  07/10/2017 CONSULTATION DATE:  07/10/17  REFERRING MD :  07/10/17  CHIEF COMPLAINT:  SOB   HISTORY OF PRESENT ILLNESS:  Michaela Rhodes is a 81 y.o. female with a PMH as outlined below.  She presented to [facility] ED 7/23 with SOB that began 3 days prior.  She had also had chest, nasal congestion, and cough for 1 week. On 7/23, EMS was called to her home and upon their arrival, she was found to be in SVT with rate of 175.  In ED, CXR demonstrated infiltrate at LLL and lingula.  CTA was negative for PE but did demonstrate LLL consolidation with air bronchograms, moderate to large left sided effusion, small right effusion, small to mod pericardial effsuion, subcarinal adenopathy.  Pt was admitted by [facility] and PCCM was asked to see in consultation for pleural effusions.   SUBJECTIVE:  No events overnight, breathing better  VITAL SIGNS: Temp:  [98.5 F (36.9 C)-98.8 F (37.1 C)] 98.8 F (37.1 C) (07/24 2100) Pulse Rate:  [78-147] 78 (07/25 0800) Resp:  [20-27] 22 (07/25 0800) BP: (109-130)/(64-94) 109/72 (07/25 0800) SpO2:  [94 %-99 %] 96 % (07/25 0800)  PHYSICAL EXAMINATION: Physical Exam  Constitutional: She is oriented to person, place, and time. She appears well-developed and well-nourished.  HENT:  Head: Normocephalic and atraumatic.  Eyes: Pupils are equal, round, and reactive to light. Conjunctivae and EOM are normal.  Neck: Normal range of motion. Neck supple.  Cardiovascular: Normal rate, regular rhythm and normal heart sounds.   Pulmonary/Chest: Effort normal and breath sounds normal.  Decreased BS on the left  Abdominal: Soft. Bowel sounds are normal. She exhibits no distension.  Musculoskeletal: Normal range of motion.  Neurological: She is alert and oriented to person, place, and time.  Skin: Skin is warm and dry.  Vitals reviewed.   Recent Labs Lab 07/10/17 1239 07/11/17 0639 07/12/17 0346  NA  137 139 137  K 4.3 4.0 3.8  CL 102 107 107  CO2  --  24 24  BUN 34* 33* 25*  CREATININE 1.00 1.37* 1.17*  GLUCOSE 241* 182* 176*    Recent Labs Lab 07/10/17 1239 07/10/17 1459 07/11/17 0639  HGB 11.6* 13.0 10.9*  HCT 34.0* 39.3 33.8*  WBC  --  16.2* 20.3*  PLT  --  PLATELET CLUMPS NOTED ON SMEAR, COUNT APPEARS INCREASED 436*   Ct Angio Chest Pe W Or Wo Contrast  Result Date: 07/10/2017 CLINICAL DATA:  81 year old female with shortness of breath starting yesterday. Chest cold for the past month. Breast cancer post left mastectomy. Subsequent encounter. EXAM: CT ANGIOGRAPHY CHEST WITH CONTRAST TECHNIQUE: Multidetector CT imaging of the chest was performed using the standard protocol during bolus administration of intravenous contrast. Multiplanar CT image reconstructions and MIPs were obtained to evaluate the vascular anatomy. CONTRAST:  65 cc Isovue 370. COMPARISON:  07/10/2017 and 05/26/2012 chest x-ray. Kyphoplasty 05/10/2017. Abdominal CT 03/15/2012. FINDINGS: Cardiovascular: Small to slightly moderate-size pericardial effusion. Heart slightly enlarged. Coronary artery calcifications. Slight motion degradation without evidence of pulmonary embolus or aortic dissection. Mediastinum/Nodes: Subcarinal adenopathy. Slightly rounded lymph nodes preaortic region. Small hiatal hernia. Lungs/Pleura: Left lower lobe consolidation with air bronchograms. Slight narrowing of the left lower lobe bronchus without obstructing mass identified. Moderately large left-sided pleural effusion. Small right-sided pleural effusion and right base atelectasis. Upper Abdomen: Renal cysts and parapelvic cyst unchanged. Mild prominence adrenal glands minimally more prominent than on the  prior exam. Musculoskeletal: Post cement augmentation T12/L1 compression fracture with kyphosis centered at this level. Remote appearing T5 superior endplate compression fracture/ Schmorl's node deformity. Post right shoulder replacement.  Post left mastectomy with reconstruction. Small amount of fluid surrounding the left breast implants. Review of the MIP images confirms the above findings. IMPRESSION: Post left mastectomy with reconstruction. Slight motion degradation without evidence of pulmonary embolus or aortic dissection. Left lower lobe consolidation with air bronchograms. Slight narrowing of the left lower lobe bronchus without obstructing mass identified. Moderately large left-sided pleural effusion. Small right-sided pleural effusion and right base atelectasis. Small to slightly moderate-size pericardial effusion. Subcarinal adenopathy and slightly rounded lymph nodes preaortic region. Findings may reflect result of infectious process. Malignant process a secondary consideration. Although there is mild pulmonary vascular congestion, appearance is not typical for pulmonary edema. Mild prominence adrenal glands minimally more prominent than on prior exam. Coronary artery calcifications. Aortic Atherosclerosis (ICD10-I70.0). Electronically Signed   By: Genia Del M.D.   On: 07/10/2017 17:14   Dg Chest Port 1 View  Result Date: 07/11/2017 CLINICAL DATA:  History of left sided thoracentesis EXAM: PORTABLE CHEST 1 VIEW COMPARISON:  Portable chest x-ray of 07/11/2017 FINDINGS: Much of the left pleural effusion has been evacuated. No definite pneumothorax is seen. Mild basilar atelectasis is present. Cardiomegaly is stable. Right reverse shoulder replacement is noted. IMPRESSION: 1. Much of the left pleural effusion has been evacuated after left thoracentesis. No definite left pneumothorax. 2. Mild bibasilar linear atelectasis. Electronically Signed   By: Ivar Drape M.D.   On: 07/11/2017 14:33   Portable Chest 1 View  Result Date: 07/11/2017 CLINICAL DATA:  Community-acquired pneumonia EXAM: PORTABLE CHEST 1 VIEW COMPARISON:  Chest x-ray and chest CT scan of July 10, 2017 FINDINGS: The right lung is adequately inflated and clear. On  the left there is persistent volume loss with parenchymal consolidation as well as pleural fluid in the mid and lower lung. There is no pneumothorax. The cardiac silhouette is mildly enlarged but stable. The pulmonary vascularity is not clearly engorged. There surgical clips in the left axillary region. There is calcification in the wall of the aortic arch. There is a prosthetic reverse right shoulder joint. IMPRESSION: Persistent infiltrate and pleural effusion on the left. Overall there may been slight interval worsening in the appearance of the left lung. Clear right lung. No pulmonary edema. Electronically Signed   By: David  Martinique M.D.   On: 07/11/2017 07:28   Dg Chest Port 1 View  Result Date: 07/10/2017 CLINICAL DATA:  Acute onset of cough and shortness of breath which began several days ago. EXAM: PORTABLE CHEST 1 VIEW COMPARISON:  05/26/2012. FINDINGS: External pacing pads are present. Cardiac silhouette markedly enlarged, increased in size since 2013. Thoracic aorta atherosclerotic, unchanged. Hilar and mediastinal contours otherwise unremarkable. Consolidation involving the left lower lobe and lingula silhouetting the left hemidiaphragm and the left heart border. Pulmonary vascularity normal without evidence of pulmonary edema. Surgical clips in the left chest wall and left axilla related to prior left mastectomy, axillary node dissection and reconstruction. Prior right shoulder arthroplasty. IMPRESSION: 1. Atelectasis and/or pneumonia involving the left lower lobe and lingula. 2. Marked cardiomegaly, with interval increase in heart size since 2013. No evidence of pulmonary edema. 3. Thoracic aortic atherosclerosis. Electronically Signed   By: Evangeline Dakin M.D.   On: 07/10/2017 12:17    STUDIES:  CXR 7/23 > infiltrate at LLL and lingula.  CTA 7/23 > negative for PE but did  demonstrate LLL consolidation with air bronchograms, moderate to large left sided effusion, small right effusion, small  to mod pericardial effsuion, subcarinal adenopathy. Echo 7/23 >   SIGNIFICANT EVENTS  7/23 > admit.  ANTIBIOTICS: Levaquin 7/23 >   CULTURES: Blood 7/23>>>NTD Sputum 7/23>>>NTD  ASSESSMENT / PLAN:  81 year old female with PMH above presenting to PCCM with SOB, hypoxemia, pleural effusion and pulmonary edema.  On exam, decreased BS at the bases.  I reviewed F/U CXR myself, pleural fluid removed with no PTX.  Discussed with PCCM-NP.  SOB: due to CAP  - Titrate O2 for sat of 88-92%  - May need an ambulatory desat study prior to discharge for ?of home O2  Pleural effusion:  - Thora performed  - Fluid analysis noted, cell count is not consistent with empyema, no LDH or protein ran (called labs and added)  - Will follow up later this afternoon  CAP:  - Levaquin empirically for CAP  - F/U on cultures NTD  Pulmonary edema: due to arrhythmia  - Keep fluid even at this point  - 2D echo  - Tele monitoring  PCCM will follow  Rush Farmer, M.D. [facility] Pulmonary/Critical Care Medicine. Pager: [phone]. After hours pager: [phone].

## 2017-07-12 NOTE — Consult Note (Signed)
[facility] CM Primary Care Navigator  07/12/2017  Michaela Rhodes 10-May-1936 [id number]   Met with patient at the bedside to identify possible discharge needs. Patient reports having congestion and shortness of breaththat had led to this admission. Patient states that Dr. Carolann Littler with [facility] is herprimary care provider    Patient shared using [facility] in [place] to obtain medications without any problem.   Patient reports managing hermedications at home using "pill box" system filled weekly.   Patient states that she was driving prior to admission, however, son Michaela Rhodes) or her 2 granddaughters will be able to provide transportation to her doctors'appointments after discharge.  [employer] transportation benefits discussed with patient as well.  Patient lives alone and independent with self care. She states that son (lives nearby), daughter in-law Loss adjuster, chartered) and granddaughters can assist with her care at home if needed.  Anticipated discharge plan is home per patient.She mentioned that her daughter in-law who is a nurse can help in taking care of her needs as well.  Patient voiced understanding to call primary care provider's officewhen she returns home,for a post discharge follow-up appointment within a week or sooner if needed.Patient letter (with PCP's contact number) was provided as a reminder.  Explained to patient about [facility] CM services available for health management at home. Patient admits that she has to get use with HF management but she declined home visits. She also states that [facility] follows her up at home. Patient also reports managing DM with medications, monitoring/ recording blood sugars, following up with provider and with informations provided by daughter in-law.   However, patient had opted and verbally agreed toEMMI Pneumoniacalls offered to help  her with recovery at home.   Referral was made for  EMMI Pneumoniacalls after discharge.  [facility] care management information provided for future needs that may arise.  For questions, please contact:  Dannielle Huh, BSN, RN- [facility] Primary Care Navigator  Telephone: [phone] [place]

## 2017-07-13 LAB — HEMOGLOBIN A1C
HEMOGLOBIN A1C: 7.9 % — AB (ref 4.8–5.6)
MEAN PLASMA GLUCOSE: 180 mg/dL

## 2017-07-13 LAB — PH, BODY FLUID: pH, Body Fluid: 8.2

## 2017-07-13 LAB — CHOLESTEROL, BODY FLUID: CHOL FL: 63 mg/dL

## 2017-07-14 LAB — BODY FLUID CULTURE
CULTURE: NO GROWTH
Special Requests: NORMAL

## 2017-07-15 LAB — CULTURE, BLOOD (ROUTINE X 2)
CULTURE: NO GROWTH
Culture: NO GROWTH
SPECIAL REQUESTS: ADEQUATE
Special Requests: ADEQUATE

## 2017-07-17 ENCOUNTER — Other Ambulatory Visit: Payer: Self-pay | Admitting: *Deleted

## 2017-07-17 NOTE — Patient Outreach (Signed)
[facility]) Care Management  07/17/2017  Michaela Rhodes 08-15-1936 [id number]  EMMI-Pneumonia -Red Alert Day#1 and Day #3  Reason -scheduled follow up appointment- 07/14/2017 Reason- been to follow up appointment-no Has diarrhea or felt nausea-yes  Call #1 to patient; left message on voice mail requesting call back.  Plan: will follow up.  Sherrin Daisy, RN BSN [place] Management Coordinator [facility] Care Management  [phone]

## 2017-07-18 ENCOUNTER — Other Ambulatory Visit: Payer: Self-pay | Admitting: *Deleted

## 2017-07-18 NOTE — Patient Outreach (Signed)
[facility]) Care Management  07/18/2017  Michaela Rhodes 1936/05/29 [id number]  EMMI-Pneumonia -referral: [place] Alert -Day#1, 07/14/2017-scheduled follow up [place] -Day#3, 07/16/2017-been to follow up appointment-no; had diarrhea or nausea-yes  Call #2 to patient. Patient gave HIPPA verification. States she has follow up appointment scheduled for tomorrow 8/1 & has transportation. Voices that she has not had diarrhea or nausea since discharge to home.  Voices that she is taking medications as prescribed and has not had any problems getting prescriptions filled .    Voices she has not had fever, trouble breathing or dark sputum  production. Patient advised to report any pneumonia symptoms to MD. Also knows when to call 911.  Patient voices no health concerns currently. EMMI referral has been addressed.  Plan: Send to care management assistant to close out.  Sherrin Daisy, RN BSN [place] Management Coordinator [facility] Care Management  [phone]

## 2017-07-21 ENCOUNTER — Other Ambulatory Visit: Payer: Self-pay | Admitting: *Deleted

## 2017-07-21 ENCOUNTER — Ambulatory Visit (INDEPENDENT_AMBULATORY_CARE_PROVIDER_SITE_OTHER): Payer: Medicare HMO | Admitting: Family Medicine

## 2017-07-21 ENCOUNTER — Encounter: Payer: Self-pay | Admitting: Family Medicine

## 2017-07-21 VITALS — BP 120/80 | HR 100 | Temp 99.0°F | Wt 209.2 lb

## 2017-07-21 DIAGNOSIS — E1165 Type 2 diabetes mellitus with hyperglycemia: Secondary | ICD-10-CM

## 2017-07-21 DIAGNOSIS — I5043 Acute on chronic combined systolic (congestive) and diastolic (congestive) heart failure: Secondary | ICD-10-CM | POA: Diagnosis not present

## 2017-07-21 DIAGNOSIS — E039 Hypothyroidism, unspecified: Secondary | ICD-10-CM

## 2017-07-21 DIAGNOSIS — I10 Essential (primary) hypertension: Secondary | ICD-10-CM

## 2017-07-21 DIAGNOSIS — J181 Lobar pneumonia, unspecified organism: Secondary | ICD-10-CM | POA: Diagnosis not present

## 2017-07-21 DIAGNOSIS — J189 Pneumonia, unspecified organism: Secondary | ICD-10-CM

## 2017-07-21 LAB — BASIC METABOLIC PANEL
BUN: 32 mg/dL — AB (ref 6–23)
CALCIUM: 9 mg/dL (ref 8.4–10.5)
CO2: 27 mEq/L (ref 19–32)
CREATININE: 1.11 mg/dL (ref 0.40–1.20)
Chloride: 103 mEq/L (ref 96–112)
GFR: 50.38 mL/min — AB (ref >60.00)
GLUCOSE: 146 mg/dL — AB (ref 70–99)
POTASSIUM: 4.2 meq/L (ref 3.5–5.1)
Sodium: 139 mEq/L (ref 135–145)

## 2017-07-21 MED ORDER — LISINOPRIL-HYDROCHLOROTHIAZIDE 20-25 MG PO TABS: 1.0000 | ORAL_TABLET | Freq: Every day | ORAL | 2 refills | Status: DC

## 2017-07-21 MED ORDER — PRAVASTATIN SODIUM 40 MG PO TABS: 40.0000 mg | ORAL_TABLET | Freq: Every day | ORAL | 2 refills | Status: DC

## 2017-07-21 MED ORDER — METFORMIN HCL ER 750 MG PO TB24
750.0000 mg | ORAL_TABLET | Freq: Two times a day (BID) | ORAL | 2 refills | Status: DC
Start: 2017-07-21 — End: 2018-03-14

## 2017-07-21 NOTE — Progress Notes (Signed)
Subjective:     Patient ID: Michaela Rhodes, female   DOB: 1936/02/16, 81 y.o.   MRN: [id number]  HPI Patient seen for hospital follow-up. She has multiple chronic problems including history of obesity, type 2 diabetes, hypothyroidism, hyperlipidemia, hypertension, diastolic heart failure. She developed what she termed a "cold "back in July. Seem to be gradually improving and then 2-3 days prior to admission seemed to worsen with some increased shortness of breath at time of admission. Patient denied any fever though there had been some question on history and physical of intermittent fevers. She had increased shortness of breath day of admission and called EMS. Heart rate 170s on their arrival. Patient's never smoked and has no history of chronic lung issues.  Patient developed acute respiratory failure during her admission and O2 sats dropped into the 50s. There was concern for pulmonary embolus and stat CT angiogram obtained which came back negative. Patient had large effusion and underwent thoracentesis which demonstrated transudate. Patient was treated for pneumonia. She met criteria for sepsis.   blood cultures negative.  Patient has finished 8 day course of Levaquin at this point and feels baseline with exception of some fatigue. No cough. No fever. No dyspnea.  SVT at admission. Metoprolol increased to 50 mg one and one half tablets twice daily. Echocardiogram ejection fraction 45-50% with grade 2 diastolic dysfunction. She resumed lisinopril at discharge.  Type 2 diabetes. A1c day of discharge 7.9%. She is reluctant to take other medications. Poor compliance with diet. Previous A1c prior to that 8.3%.  Patient had back pain and evaluation with compression fractures and had kyphoplasty per interventional radiology last spring. Denies any back pain currently. DEXA scan 1 year ago showed osteopenia (hip) with normal bone density of the spine but likely false negative study  Past Medical History:   Diagnosis Date  . DM2 (diabetes mellitus, type 2) ([place])   . Hyperlipidemia   . Hypertension   . Hypothyroidism   . Panic attacks    mild  . SVT (supraventricular tachycardia) (H[facility])    in the past  . Wears glasses    Past Surgical History:  Procedure Laterality Date  . ABDOMINAL HYSTERECTOMY     BSO as well  . APPENDECTOMY    . APPLICATION OF A-CELL OF EXTREMITY Left 01/15/2015   Procedure: APPLICATION OF A-CELL OF EXTREMITY;  Surgeon: Theodoro Kos, DO;  Location: [place];  Service: Plastics;  Laterality: Left;  . CHOLECYSTECTOMY    . I&D EXTREMITY Left 01/15/2015   Procedure: IRRIGATION AND DEBRIDEMENT EXTREMITY;  Surgeon: Theodoro Kos, DO;  Location: [place];  Service: Plastics;  Laterality: Left;  . IR KYPHO EA ADDL LEVEL THORACIC OR LUMBAR  05/10/2017  . IR KYPHO THORACIC WITH BONE BIOPSY  05/10/2017  . IR RADIOLOGIST EVAL & MGMT  04/28/2017  . MASTECTOMY  1985   Left  . RECONSTRUCTION BREAST W/ LATISSIMUS DORSI FLAP    . TOTAL SHOULDER ARTHROPLASTY  05/27/2012   Procedure: TOTAL SHOULDER ARTHROPLASTY;  Surgeon: Johnny Bridge, MD;  Location: [place];  Service: Orthopedics;  Laterality: Right;    reports that she has never smoked. She has never used smokeless tobacco. She reports that she does not drink alcohol or use drugs. family history includes Coronary artery disease in her unknown relative; Diabetes in her unknown relative; Heart disease in her mother; Hypertension in her father, mother, sister, and unknown relative; Stroke in her father. Allergies  Allergen Reactions  . Penicillins  Anaphylaxis and Hives    Has patient had a PCN reaction causing immediate rash, facial/tongue/throat swelling, SOB or lightheadedness with hypotension: Yes Has patient had a PCN reaction causing severe rash involving mucus membranes or skin necrosis: Yes Has patient had a PCN reaction that required hospitalization: No Has patient had a PCN reaction occurring  within the last 10 years: Yes If all of the above answers are "NO", then may proceed with Cephalosporin use.   Marland Kitchen Keflex [Cephalexin]     Rash      Review of Systems  Constitutional: Positive for fatigue. Negative for chills and fever.  Respiratory: Negative for cough, shortness of breath and wheezing.   Cardiovascular: Negative for chest pain, palpitations and leg swelling.  Gastrointestinal: Negative for abdominal pain and diarrhea.  Endocrine: Negative for polydipsia and polyuria.  Genitourinary: Negative for dysuria.  Neurological: Negative for dizziness.  Psychiatric/Behavioral: Negative for confusion.       Objective:   Physical Exam  Constitutional: She is oriented to person, place, and time. She appears well-developed and well-nourished.  HENT:  Mouth/Throat: Oropharynx is clear and moist.  Neck: Neck supple.  Cardiovascular: Normal rate and regular rhythm.   Pulmonary/Chest: Effort normal. No respiratory distress. She has no wheezes. She has no rales.  Slightly diminished breath sounds both bases but clear  Musculoskeletal: She exhibits no edema.  Lymphadenopathy:    She has no cervical adenopathy.  Neurological: She is alert and oriented to person, place, and time.       Assessment:     #1 recent community-acquired pneumonia left middle lower lobes clinically improved. She met criteria for sepsis on admission. Has now completed course of Levaquin. She had a large pleural effusion related to her pneumonia  #2 type 2 diabetes with history of poor control. Recent A1c 7.9%  #3 SVT. Rate currently stable on increased dose of metoprolol  #4 recent acute on chronic systolic and diastolic heart failure. Patient now back on beta blocker and lisinopril  #5 hypothyroidism with TSH stable    Plan:     -Obtain follow-up chest x-ray to ensure clearing of infiltrate and she will do this middle to late next week -Repeat basic metabolic panel -Long discussion with patient  regarding diet versus additional medication for diabetes control and she prefers the former. -We offered formal diabetic education but she declines at this time. She's had instruction with dietitian past -Follow-up immediately for any shortness of breath or recurrent fever -Routine medical follow-up in 3 months -Over 40 minutes spent with patient which greater than 50% in direct assessment and counseling regarding diet, diabetes control, goals for therapy, planned future follow-up test with chest x-ray and labs, review of things to watch for complicationg her recovery  [name] MD [place] [facility]

## 2017-07-24 ENCOUNTER — Telehealth: Payer: Self-pay | Admitting: Family Medicine

## 2017-07-24 DIAGNOSIS — M549 Dorsalgia, unspecified: Secondary | ICD-10-CM

## 2017-07-24 NOTE — Telephone Encounter (Signed)
Okay to refer to PT

## 2017-07-24 NOTE — Telephone Encounter (Signed)
OK to refer.

## 2017-07-24 NOTE — Telephone Encounter (Signed)
referral placed

## 2017-07-24 NOTE — Telephone Encounter (Signed)
° °  Pt call to say she would like to have physical therapy again on her back. She said she has had physical therapy before and would like to have it again do to her being bent over .

## 2017-08-03 ENCOUNTER — Ambulatory Visit: Payer: Medicare HMO | Attending: Family Medicine | Admitting: Physical Therapy

## 2017-08-03 ENCOUNTER — Encounter: Payer: Self-pay | Admitting: Physical Therapy

## 2017-08-03 DIAGNOSIS — R293 Abnormal posture: Secondary | ICD-10-CM | POA: Insufficient documentation

## 2017-08-03 DIAGNOSIS — M545 Low back pain: Secondary | ICD-10-CM | POA: Diagnosis not present

## 2017-08-03 DIAGNOSIS — G8929 Other chronic pain: Secondary | ICD-10-CM | POA: Insufficient documentation

## 2017-08-03 DIAGNOSIS — M6281 Muscle weakness (generalized): Secondary | ICD-10-CM | POA: Insufficient documentation

## 2017-08-03 NOTE — Therapy (Signed)
[facility] [address], [place], [place], [address] Phone: [phone]   Fax:  [phone]  Physical Therapy Evaluation  Patient Details  Name: Michaela Rhodes MRN: [id number] Date of Birth: Dec 02, 1936 Referring Provider: Dr. Carolann Littler  Encounter Date: 08/03/2017      PT End of Session - 08/03/17 0913    Visit Number 1   Number of Visits 10   Date for PT Re-Evaluation 09/28/17   Authorization Type used 6 visits this year so KX modifier on 9th visit; g-code on 10th visit   PT Start Time 0833   PT Stop Time 0910   PT Time Calculation (min) 37 min   Activity Tolerance Patient tolerated treatment well   Behavior During Therapy [facility] for tasks assessed/performed      Past Medical History:  Diagnosis Date  . Cancer [facility])    breast cancer  . DM2 (diabetes mellitus, type 2) ([place])   . Hyperlipidemia   . Hypertension   . Hypothyroidism   . Panic attacks    mild  . SVT (supraventricular tachycardia) (HCC)    in the past  . Wears glasses     Past Surgical History:  Procedure Laterality Date  . ABDOMINAL HYSTERECTOMY     BSO as well  . APPENDECTOMY    . APPLICATION OF A-CELL OF EXTREMITY Left 01/15/2015   Procedure: APPLICATION OF A-CELL OF EXTREMITY;  Surgeon: Theodoro Kos, DO;  Location: [facility];  Service: Plastics;  Laterality: Left;  . CHOLECYSTECTOMY    . I&D EXTREMITY Left 01/15/2015   Procedure: IRRIGATION AND DEBRIDEMENT EXTREMITY;  Surgeon: Theodoro Kos, DO;  Location: [place];  Service: Plastics;  Laterality: Left;  . IR KYPHO EA ADDL LEVEL THORACIC OR LUMBAR  05/10/2017  . IR KYPHO THORACIC WITH BONE BIOPSY  05/10/2017  . IR RADIOLOGIST EVAL & MGMT  04/28/2017  . MASTECTOMY  1985   Left  . RECONSTRUCTION BREAST W/ LATISSIMUS DORSI FLAP    . TOTAL SHOULDER ARTHROPLASTY  05/27/2012   Procedure: TOTAL SHOULDER ARTHROPLASTY;  Surgeon: Johnny Bridge, MD;  Location:  [place];  Service: Orthopedics;  Laterality: Right;    There were no vitals filed for this visit.       Subjective Assessment - 08/03/17 0837    Subjective Patient reports she has gradually having difficulty straightening up.  Patient exercise.  Patient pain has increased since 05/10/2017.  She had khyphoplasty. Unable to lay on back. Has to lay on side so the arms go to sleep.  Patient will sleep on a couch with head benf forward.    Limitations Walking;Standing   Patient Stated Goals reduce pain; work on sleeping position in bed   Currently in Pain? Yes   Pain Score 7    Pain Location Back   Pain Orientation Mid;Left   Pain Descriptors / Indicators Dull   Pain Type Chronic pain   Pain Radiating Towards back to the left hip   Pain Onset More than a month ago   Pain Frequency Intermittent   Aggravating Factors  walking, standing,   Pain Relieving Factors sitting   Multiple Pain Sites No            [facility] PT Assessment - 08/03/17 0001      Assessment   Medical Diagnosis M54.9 Back pain, unspecified back location, unspecified back pain laterality, unspecified chronicity   Referring Provider Dr. Carolann Littler   Onset Date/Surgical Date 05/10/17  Prior Therapy for hip     Precautions   Precautions Other (comment)   Precaution Comments cancer, osteoporosis     Restrictions   Weight Bearing Restrictions No     Balance Screen   Has the patient fallen in the past 6 months No   Has the patient had a decrease in activity level because of a fear of falling?  No   Is the patient reluctant to leave their home because of a fear of falling?  No     Home Ecologist residence     Prior Function   Level of Independence Independent   Vocation Retired   Leisure exercise at the [employer]   Overall Cognitive Status Within Functional Limits for tasks assessed     Observation/Other Assessments   Focus on Therapeutic Outcomes (FOTO)  47%  limitation  goal is 37% limitation     Posture/Postural Control   Posture/Postural Control Postural limitations   Postural Limitations Flexed trunk;Rounded Shoulders;Forward head;Decreased lumbar lordosis  hip shifted to the right     ROM / Strength   AROM / PROM / Strength AROM;PROM;Strength     AROM   Lumbar Flexion full   Lumbar Extension can go to neutral   Lumbar - Right Side Bend decreased by 50%   Lumbar - Left Side Bend decreased by 50%     Strength   Overall Strength Comments trunk extension strength is 2/5; abdominal strength is 1/5   Right Hip Flexion 4/5   Right Hip Extension 3/5   Right Hip ABduction 3/5   Left Hip Flexion 4-/5   Left Hip Extension 3/5   Left Hip ABduction 3/5   Right Knee Flexion 4/5   Left Knee Flexion 4/5     Transfers   Transfers Not assessed            Objective measurements completed on examination: See above findings.                  PT Education - 08/03/17 0913    Education provided Yes   Education Details posture correction and body mechanica   Person(s) Educated Patient   Methods Explanation;Demonstration;Handout   Comprehension Returned demonstration;Verbalized understanding          PT Short Term Goals - 08/03/17 0924      PT SHORT TERM GOAL #1   Title independent with initial HEP   Time 4   Period Weeks   Status New   Target Date 08/31/17     PT SHORT TERM GOAL #2   Title able to sleep in her bed due to being able to get in and out of bed with proper mechanics and lay in bed   Time 4   Period Weeks   Status New   Target Date 08/31/17     PT SHORT TERM GOAL #3   Title ability to walk with upright posture with pain decreased >/= 25%   Time 4   Period Weeks   Status New   Target Date 08/31/17           PT Long Term Goals - 08/03/17 0925      PT LONG TERM GOAL #1   Title independent with advanced HEP   Time 8   Period Weeks   Status New   Target Date 09/28/17     PT LONG TERM  GOAL #2   Title FOTO < or = 37%  limitation   Time 8   Period Weeks   Status New   Target Date 09/28/17     PT LONG TERM GOAL #3   Title  get in and out of bed with 50% less pain due to increased strength and improved posture   Time 8   Period Weeks   Status New   Target Date 09/28/17     PT LONG TERM GOAL #4   Title sleep in her bed all night with accommodations to her flexed posture and reduction in pain >/= 50%   Baseline --   Time 8   Period Weeks   Status New   Target Date 09/28/17     PT LONG TERM GOAL #5   Title walk for 15 minutes in the store with upright posture and pain decreased >/= 50%   Baseline --   Time 8   Period Weeks   Status New   Target Date 09/28/17                Plan - 08/03/17 0914    Clinical Impression Statement Patient is a 81 year old female with back and hip pain that has become progressivly worse in the past 6 months.  Patient reports her pain is 7/10 intermiitently when she walks and stands. Patient posture consists of forward head, ,rounded shoulders, reduced lumbar lordosis, flexed posture, and hip to right.  Patient will walk with a flexed posture.  Patient is able to come to neutral in standing and bilateral sidebending decreased by 50%.  Bilateral hip strength is 3/5 with flexion 4/5.  Bilateral knee flexion is 4/5.  Patient is able to correct her posture but will fatique.  Patient is sleeping in her couch because she is unable to lay flat in her bed.  Patient will benefit from skilled therapy  to improve postural strength, hip strength, correct body mechanics to reduce strain on the back and improve function.    History and Personal Factors relevant to plan of care: Breast cancer; Kypho thoracic surgery; masectomy; abdominal hysterectomy   Clinical Presentation Evolving   Clinical Presentation due to: pain is progressively worse with her posture getting worse so she is not able to do daily tasks   Clinical Decision Making Moderate    Rehab Potential Excellent   Clinical Impairments Affecting Rehab Potential Breast cancer; Kypho thoracic surgery; masectomy; abdominal hysterectomy   PT Frequency 2x / week   PT Duration 8 weeks   PT Treatment/Interventions Moist Heat;Electrical Stimulation;Cryotherapy;Therapeutic activities;Therapeutic exercise;Dry needling;Neuromuscular re-education;Patient/family education;Manual techniques;Taping   PT Next Visit Plan postural strength to improve lumbar extension so she is able to lay on her back in bed; hip stretches for quads, hamstring, hip adductors; abdominal bracing   PT Home Exercise Plan progress as needed   Consulted and Agree with Plan of Care Patient      Patient will benefit from skilled therapeutic intervention in order to improve the following deficits and impairments:  Pain, Decreased strength, Decreased mobility, Decreased activity tolerance, Decreased endurance, Decreased range of motion, Difficulty walking, Increased fascial restricitons  Visit Diagnosis: Abnormal posture - Plan: PT plan of care cert/re-cert  Muscle weakness (generalized) - Plan: PT plan of care cert/re-cert  Chronic bilateral low back pain without sciatica - Plan: PT plan of care cert/re-cert      G-Codes - 84/66/59 9357    Functional Assessment Tool Used (Outpatient Only) FOTO score is 47% limitation   Functional Limitation Mobility: Walking and moving around  Mobility: Walking and Moving Around Current Status [phone]) At least 40 percent but less than 60 percent impaired, limited or restricted   Mobility: Walking and Moving Around Goal Status [phone]) At least 20 percent but less than 40 percent impaired, limited or restricted       Problem List Patient Active Problem List   Diagnosis Date Noted  . S/P thoracentesis   . Acute diastolic CHF (congestive heart failure) ([place])   . AKI (acute kidney injury) ([place])   . CAP (community acquired pneumonia) 07/10/2017  . Acute respiratory failure  ([place]) 07/10/2017  . Diabetes mellitus with complication ([place]) 71/16/5790  . Pleural effusion on left 07/10/2017  . Lactic acidemia 07/10/2017  . Sepsis, unspecified organism ([place]) 07/10/2017  . Osteopenia 04/02/2016  . Diabetes type 2, uncontrolled ([place])   . Other specified hypothyroidism   . Acute renal failure syndrome ([place])   . Cellulitis 12/27/2014  . Cellulitis of left lower extremity 12/26/2014  . Obesity (BMI 30-39.9) 08/30/2013  . Hyperlipidemia 09/25/2012  . Breast cancer, left ([place]) 07/26/2012  . Syncope 06/26/2012  . Type 2 diabetes mellitus, uncontrolled ([place]) 05/30/2012  . Acute renal failure ([place]) 05/30/2012  . HTN (hypertension) 05/30/2012  . Postoperative anemia due to acute blood loss 05/29/2012  . Tachycardia 05/27/2012  . Hyperglycemia 05/27/2012  . Closed fracture of right proximal humerus 05/26/2012  . OVERWEIGHT 10/19/2009  . Hypothyroidism 10/16/2009  . Benign essential HTN 10/16/2009  . SVT (supraventricular tachycardia) ([place]) 10/16/2009    Earlie Counts, PT 08/03/17 9:30 AM   [facility] [address] [place], [place], [address] Phone: [phone]   Fax:  [phone]  Name: Michaela Rhodes MRN: [id number] Date of Birth: [birth year]

## 2017-08-03 NOTE — Patient Instructions (Addendum)
Lay on your back for 30 minutes 2 times per day to stretch out your spine.   Getting Into / Out of Bed    Lower self to lie down on one side by raising legs and lowering head at the same time. Use arms to assist moving without twisting. Bend both knees to roll onto back if desired. To sit up, start from lying on side, and use same move-ments in reverse. Keep trunk aligned with legs.   Copyright  VHI. All rights reserved.  Log Roll    Lying on back, bend left knee and place left arm across chest. Roll all in one movement to the right. Reverse to roll to the left. Always move as one unit.   Copyright  VHI. All rights reserved.  Avoid Twisting    Avoid twisting or bending back. Pivot around using foot movements, and bend at knees if needed when reaching for articles.   Copyright  VHI. All rights reserved.  Bending    Bend at hips and knees, not back. Keep feet shoulder-width apart.   Copyright  VHI. All rights reserved.  Getting Into and Out of Bed    Sit on edge of bed, feet on floor. Lie down sideways. Roll over onto back. Keep back straight. Use hand and elbow to lower and raise trunk. Move shoulders and hips at same rate to avoid twisting the back. To get out of bed, reverse movement.   Copyright  VHI. All rights reserved.  Positioning on Back in Bed    Place pillow under knees. Use towel roll or pillow under neck and/or head if needed. Any pillow under head and/or neck should be as small as possible but enough to protect alignment of this area. Place 2-3 pillows under the upper back to head.   Copyright  VHI. All rights reserved.  Positioning on Back in Bed    Place pillow under knees. Use towel roll or pillow under neck and/or head if needed. Any pillow under head and/or neck should be as small as possible but enough to protect alignment of this area.  Copyright  VHI. All rights reserved.  Eating    Sit, protecting natural arch in low back.  Bring food to mouth, not mouth to food. Do not lean on elbows or arms.  Copyright  VHI. All rights reserved.  Waist Lengthener    Feel top of pelvis (hipbones) all the way around and bottom of ribs all the way around. Lengthen space between them by lifting ribs up and away from pelvis. Repeat frequently during the day. Do in standing and sitting. So this every hour.   Copyright  VHI. All rights reserved.  Wing Spread    Pretend collarbones are wings. Spread wings, expanding front of chest. DO NOT PULL SHOULDERS BACK. Practice frequently during the day.  Copyright  VHI. All rights reserved.  [facility] [address], [place], [place] [address] Phone # [phone] Fax [phone]

## 2017-08-10 ENCOUNTER — Ambulatory Visit: Payer: Medicare HMO

## 2017-08-10 DIAGNOSIS — M545 Low back pain: Secondary | ICD-10-CM | POA: Diagnosis not present

## 2017-08-10 DIAGNOSIS — M6281 Muscle weakness (generalized): Secondary | ICD-10-CM

## 2017-08-10 DIAGNOSIS — R293 Abnormal posture: Secondary | ICD-10-CM

## 2017-08-10 DIAGNOSIS — G8929 Other chronic pain: Secondary | ICD-10-CM | POA: Diagnosis not present

## 2017-08-10 NOTE — Therapy (Signed)
[facility] [address], [place], [place], [address] Phone: [phone]   Fax:  [phone]  Physical Therapy Treatment  Patient Details  Name: Michaela Rhodes MRN: [id number] Date of Birth: 1936/04/13 Referring Provider: Dr. Carolann Littler  Encounter Date: 08/10/2017      PT End of Session - 08/10/17 1052    Visit Number 2   Number of Visits 10   Date for PT Re-Evaluation 09/28/17   Authorization Type used 6 visits this year so KX modifier on 9th visit; g-code on 10th visit   PT Start Time 1017   PT Stop Time 1101   PT Time Calculation (min) 44 min   Activity Tolerance Patient tolerated treatment well   Behavior During Therapy [facility] for tasks assessed/performed      Past Medical History:  Diagnosis Date  . Cancer [facility])    breast cancer  . DM2 (diabetes mellitus, type 2) ([place])   . Hyperlipidemia   . Hypertension   . Hypothyroidism   . Panic attacks    mild  . SVT (supraventricular tachycardia) ([facility])    in the past  . Wears glasses     Past Surgical History:  Procedure Laterality Date  . ABDOMINAL HYSTERECTOMY     BSO as well  . APPENDECTOMY    . APPLICATION OF A-CELL OF EXTREMITY Left 01/15/2015   Procedure: APPLICATION OF A-CELL OF EXTREMITY;  Surgeon: Theodoro Kos, DO;  Location: [place];  Service: Plastics;  Laterality: Left;  . CHOLECYSTECTOMY    . I&D EXTREMITY Left 01/15/2015   Procedure: IRRIGATION AND DEBRIDEMENT EXTREMITY;  Surgeon: Theodoro Kos, DO;  Location: [place];  Service: Plastics;  Laterality: Left;  . IR KYPHO EA ADDL LEVEL THORACIC OR LUMBAR  05/10/2017  . IR KYPHO THORACIC WITH BONE BIOPSY  05/10/2017  . IR RADIOLOGIST EVAL & MGMT  04/28/2017  . MASTECTOMY  1985   Left  . RECONSTRUCTION BREAST W/ LATISSIMUS DORSI FLAP    . TOTAL SHOULDER ARTHROPLASTY  05/27/2012   Procedure: TOTAL SHOULDER ARTHROPLASTY;  Surgeon: Johnny Bridge, MD;  Location:  [place];  Service: Orthopedics;  Laterality: Right;    There were no vitals filed for this visit.      Subjective Assessment - 08/10/17 1020    Subjective Pt has been doing the decompression exercises.     Patient Stated Goals reduce pain; work on sleeping position in bed   Currently in Pain? Yes   Pain Score 7    Pain Location Back   Pain Orientation Mid;Left   Pain Descriptors / Indicators Dull   Pain Type Chronic pain   Pain Onset More than a month ago   Pain Frequency Intermittent   Aggravating Factors  walking and standing   Pain Relieving Factors sitting                         [facility] Adult PT Treatment/Exercise - 08/10/17 0001      Exercises   Exercises Knee/Hip;Shoulder;Neck     Neck Exercises: Machines for Strengthening   UBE (Upper Arm Bike) --     Neck Exercises: Theraband   Rows 20 reps   Rows Limitations yellow theraband   Shoulder External Rotation 20 reps   Shoulder External Rotation Limitations yellow theraband   Horizontal ABduction 20 reps   Horizontal ABduction Limitations yellow theraband in supine     Neck Exercises: Supine  Other Supine Exercise decompression exercise: supine on wedge, scapular squeezes and head press     Knee/Hip Exercises: Stretches   Active Hamstring Stretch Both;3 reps;20 seconds     Knee/Hip Exercises: Aerobic   Nustep Level 1 x 10 minutes                  PT Short Term Goals - 08/10/17 1025      PT SHORT TERM GOAL #1   Title independent with initial HEP   Time 4   Period Weeks   Status On-going     PT SHORT TERM GOAL #2   Title able to sleep in her bed due to being able to get in and out of bed with proper mechanics and lay in bed   Time 4   Period Weeks   Status On-going     PT SHORT TERM GOAL #3   Title ability to walk with upright posture with pain decreased >/= 25%   Time 4   Period Weeks   Status On-going           PT Long Term Goals - 08/03/17 0925      PT LONG TERM  GOAL #1   Title independent with advanced HEP   Time 8   Period Weeks   Status New   Target Date 09/28/17     PT LONG TERM GOAL #2   Title FOTO < or = 37% limitation   Time 8   Period Weeks   Status New   Target Date 09/28/17     PT LONG TERM GOAL #3   Title  get in and out of bed with 50% less pain due to increased strength and improved posture   Time 8   Period Weeks   Status New   Target Date 09/28/17     PT LONG TERM GOAL #4   Title sleep in her bed all night with accommodations to her flexed posture and reduction in pain >/= 50%   Baseline --   Time 8   Period Weeks   Status New   Target Date 09/28/17     PT LONG TERM GOAL #5   Title walk for 15 minutes in the store with upright posture and pain decreased >/= 50%   Baseline --   Time 8   Period Weeks   Status New   Target Date 09/28/17               Plan - 08/10/17 1026    Clinical Impression Statement Pt with only 1 session after evaluation.  Pt has received body mechanics education and is making postural modifications at home.  Pt with poor posture and has difficulty activating scapular stabilizers.  Pt was not able to lie supine for exercise so used a wedge for semi-reclined position.  Pt required tactile and verbal cues for techinque with decompression exercise today. Pt will continue to benefit from skilled PT for scapular stabilization, postural strength and flexibility exercises.   Clinical Impairments Affecting Rehab Potential Breast cancer; Kypho thoracic surgery; masectomy; abdominal hysterectomy   PT Frequency 2x / week   PT Duration 8 weeks   PT Treatment/Interventions Moist Heat;Electrical Stimulation;Cryotherapy;Therapeutic activities;Therapeutic exercise;Dry needling;Neuromuscular re-education;Patient/family education;Manual techniques;Taping   PT Next Visit Plan postural strength to improve lumbar extension so she is able to lay on her back in bed; hip stretches for quads, hamstring, hip  adductors; abdominal bracing   Consulted and Agree with Plan of Care Patient  Patient will benefit from skilled therapeutic intervention in order to improve the following deficits and impairments:  Pain, Decreased strength, Decreased mobility, Decreased activity tolerance, Decreased endurance, Decreased range of motion, Difficulty walking, Increased fascial restricitons  Visit Diagnosis: Abnormal posture  Muscle weakness (generalized)  Chronic bilateral low back pain without sciatica     Problem List Patient Active Problem List   Diagnosis Date Noted  . S/P thoracentesis   . Acute diastolic CHF (congestive heart failure) ([place])   . AKI (acute kidney injury) ([place])   . CAP (community acquired pneumonia) 07/10/2017  . Acute respiratory failure ([place]) 07/10/2017  . Diabetes mellitus with complication ([place]) 66/81/5947  . Pleural effusion on left 07/10/2017  . Lactic acidemia 07/10/2017  . Sepsis, unspecified organism ([place]) 07/10/2017  . Osteopenia 04/02/2016  . Diabetes type 2, uncontrolled ([place])   . Other specified hypothyroidism   . Acute renal failure syndrome ([place])   . Cellulitis 12/27/2014  . Cellulitis of left lower extremity 12/26/2014  . Obesity (BMI 30-39.9) 08/30/2013  . Hyperlipidemia 09/25/2012  . Breast cancer, left ([place]) 07/26/2012  . Syncope 06/26/2012  . Type 2 diabetes mellitus, uncontrolled ([place]) 05/30/2012  . Acute renal failure ([place]) 05/30/2012  . HTN (hypertension) 05/30/2012  . Postoperative anemia due to acute blood loss 05/29/2012  . Tachycardia 05/27/2012  . Hyperglycemia 05/27/2012  . Closed fracture of right proximal humerus 05/26/2012  . OVERWEIGHT 10/19/2009  . Hypothyroidism 10/16/2009  . Benign essential HTN 10/16/2009  . SVT (supraventricular tachycardia) ([place]) 10/16/2009     Sigurd Sos, PT 08/10/17 10:54 AM  [facility] [address] [place], [place],  [address] Phone: [phone]   Fax:  [phone]  Name: Michaela Rhodes MRN: [id number] Date of Birth: 07-27-1936

## 2017-08-15 ENCOUNTER — Ambulatory Visit: Payer: Medicare HMO

## 2017-08-15 DIAGNOSIS — G8929 Other chronic pain: Secondary | ICD-10-CM

## 2017-08-15 DIAGNOSIS — M545 Low back pain: Secondary | ICD-10-CM

## 2017-08-15 DIAGNOSIS — R293 Abnormal posture: Secondary | ICD-10-CM

## 2017-08-15 DIAGNOSIS — M6281 Muscle weakness (generalized): Secondary | ICD-10-CM | POA: Diagnosis not present

## 2017-08-15 NOTE — Therapy (Signed)
[facility] [address] [place], [place], [address] Phone: [phone]   Fax:  [phone]  Physical Therapy Treatment  Patient Details  Name: Michaela Rhodes MRN: [id number] Date of Birth: [birth year] Referring Provider: Dr. Carolann Littler  Encounter Date: 08/15/2017      PT End of Session - 08/15/17 0838    Visit Number 3   Number of Visits 10   Date for PT Re-Evaluation 09/28/17   Authorization Type used 6 visits this year so KX modifier on 9th visit; g-code on 10th visit   PT Start Time 0800   PT Stop Time 0840   PT Time Calculation (min) 40 min   Activity Tolerance Patient tolerated treatment well   Behavior During Therapy [facility] for tasks assessed/performed      Past Medical History:  Diagnosis Date  . Cancer [facility])    breast cancer  . DM2 (diabetes mellitus, type 2) ([place])   . Hyperlipidemia   . Hypertension   . Hypothyroidism   . Panic attacks    mild  . SVT (supraventricular tachycardia) (HCC)    in the past  . Wears glasses     Past Surgical History:  Procedure Laterality Date  . ABDOMINAL HYSTERECTOMY     BSO as well  . APPENDECTOMY    . APPLICATION OF A-CELL OF EXTREMITY Left 01/15/2015   Procedure: APPLICATION OF A-CELL OF EXTREMITY;  Surgeon: Theodoro Kos, DO;  Location: [place];  Service: Plastics;  Laterality: Left;  . CHOLECYSTECTOMY    . I&D EXTREMITY Left 01/15/2015   Procedure: IRRIGATION AND DEBRIDEMENT EXTREMITY;  Surgeon: Theodoro Kos, DO;  Location: [place];  Service: Plastics;  Laterality: Left;  . IR KYPHO EA ADDL LEVEL THORACIC OR LUMBAR  05/10/2017  . IR KYPHO THORACIC WITH BONE BIOPSY  05/10/2017  . IR RADIOLOGIST EVAL & MGMT  04/28/2017  . MASTECTOMY  1985   Left  . RECONSTRUCTION BREAST W/ LATISSIMUS DORSI FLAP    . TOTAL SHOULDER ARTHROPLASTY  05/27/2012   Procedure: TOTAL SHOULDER ARTHROPLASTY;  Surgeon: Johnny Bridge, MD;  Location:  [place];  Service: Orthopedics;  Laterality: Right;    There were no vitals filed for this visit.      Subjective Assessment - 08/15/17 0810    Subjective No pain today. I have been doing the exercises and they make me a little bit stiff.     Currently in Pain? No/denies                         [facility] Adult PT Treatment/Exercise - 08/15/17 0001      Neck Exercises: Theraband   Rows 20 reps   Rows Limitations yellow theraband   Shoulder External Rotation 20 reps   Shoulder External Rotation Limitations yellow theraband   Horizontal ABduction 20 reps   Horizontal ABduction Limitations yellow theraband in supine     Neck Exercises: Supine   Other Supine Exercise decompression exercise: supine on wedge, scapular squeezes and head press     Knee/Hip Exercises: Stretches   Active Hamstring Stretch Both;3 reps;20 seconds     Knee/Hip Exercises: Aerobic   Nustep Level 1 x 10 minutes     Knee/Hip Exercises: Standing   Hip Abduction Stengthening;Both;2 sets;10 reps  focus on core and posture   Rebounder weight shifting 3 ways: 1 minute each   Other Standing Knee Exercises stading on balance pad: 3x 30  seconds                  PT Short Term Goals - 08/15/17 0817      PT SHORT TERM GOAL #1   Title independent with initial HEP   Status Achieved     PT SHORT TERM GOAL #2   Title able to sleep in her bed due to being able to get in and out of bed with proper mechanics and lay in bed   Baseline sleeping on couch   Time 4   Period Weeks   Status On-going     PT SHORT TERM GOAL #3   Title ability to walk with upright posture with pain decreased >/= 25%   Baseline no pain today, difficulty with upright posture   Time 4   Period Weeks   Status On-going           PT Long Term Goals - 08/03/17 0925      PT LONG TERM GOAL #1   Title independent with advanced HEP   Time 8   Period Weeks   Status New   Target Date 09/28/17     PT LONG TERM GOAL  #2   Title FOTO < or = 37% limitation   Time 8   Period Weeks   Status New   Target Date 09/28/17     PT LONG TERM GOAL #3   Title  get in and out of bed with 50% less pain due to increased strength and improved posture   Time 8   Period Weeks   Status New   Target Date 09/28/17     PT LONG TERM GOAL #4   Title sleep in her bed all night with accommodations to her flexed posture and reduction in pain >/= 50%   Baseline --   Time 8   Period Weeks   Status New   Target Date 09/28/17     PT LONG TERM GOAL #5   Title walk for 15 minutes in the store with upright posture and pain decreased >/= 50%   Baseline --   Time 8   Period Weeks   Status New   Target Date 09/28/17               Plan - 08/15/17 0814    Clinical Impression Statement Pt is making postural corrections at home.  Pt with chronic forward trunk flexion and forward head/rounded shoulders.  Pt has difficulty activating her scapular stabilizers.  Pt requires tactilce and verbal cues for technique with decompression exercise today.  Pt will continue to benefit from skilled PT for scapular stabilization, postural strength and flexibility exercises.     Rehab Potential Excellent   PT Frequency 2x / week   PT Duration 8 weeks   PT Treatment/Interventions Moist Heat;Electrical Stimulation;Cryotherapy;Therapeutic activities;Therapeutic exercise;Dry needling;Neuromuscular re-education;Patient/family education;Manual techniques;Taping   PT Next Visit Plan postural strength to improve lumbar extension so she is able to lay on her back in bed; hip stretches for quads, hamstring, hip adductors; abdominal bracing   Consulted and Agree with Plan of Care Patient      Patient will benefit from skilled therapeutic intervention in order to improve the following deficits and impairments:  Pain, Decreased strength, Decreased mobility, Decreased activity tolerance, Decreased endurance, Decreased range of motion, Difficulty  walking, Increased fascial restricitons  Visit Diagnosis: Abnormal posture  Muscle weakness (generalized)  Chronic bilateral low back pain without sciatica     Problem List Patient Active  Problem List   Diagnosis Date Noted  . S/P thoracentesis   . Acute diastolic CHF (congestive heart failure) ([place])   . AKI (acute kidney injury) ([place])   . CAP (community acquired pneumonia) 07/10/2017  . Acute respiratory failure ([place]) 07/10/2017  . Diabetes mellitus with complication ([place]) 56/70/1410  . Pleural effusion on left 07/10/2017  . Lactic acidemia 07/10/2017  . Sepsis, unspecified organism ([place]) 07/10/2017  . Osteopenia 04/02/2016  . Diabetes type 2, uncontrolled ([place])   . Other specified hypothyroidism   . Acute renal failure syndrome ([place])   . Cellulitis 12/27/2014  . Cellulitis of left lower extremity 12/26/2014  . Obesity (BMI 30-39.9) 08/30/2013  . Hyperlipidemia 09/25/2012  . Breast cancer, left ([place]) 07/26/2012  . Syncope 06/26/2012  . Type 2 diabetes mellitus, uncontrolled ([place]) 05/30/2012  . Acute renal failure ([place]) 05/30/2012  . HTN (hypertension) 05/30/2012  . Postoperative anemia due to acute blood loss 05/29/2012  . Tachycardia 05/27/2012  . Hyperglycemia 05/27/2012  . Closed fracture of right proximal humerus 05/26/2012  . OVERWEIGHT 10/19/2009  . Hypothyroidism 10/16/2009  . Benign essential HTN 10/16/2009  . SVT (supraventricular tachycardia) ([place]) 10/16/2009     Sigurd Sos, PT 08/15/17 8:41 AM  [facility] [address], [place], [place], [address] Phone: [phone]   Fax:  [phone]  Name: Michaela Rhodes MRN: [id number] Date of Birth: Mar 24, 1936

## 2017-08-17 ENCOUNTER — Encounter: Payer: Self-pay | Admitting: Physical Therapy

## 2017-08-17 ENCOUNTER — Ambulatory Visit: Payer: Medicare HMO | Admitting: Physical Therapy

## 2017-08-17 DIAGNOSIS — M545 Low back pain, unspecified: Secondary | ICD-10-CM

## 2017-08-17 DIAGNOSIS — R293 Abnormal posture: Secondary | ICD-10-CM | POA: Diagnosis not present

## 2017-08-17 DIAGNOSIS — G8929 Other chronic pain: Secondary | ICD-10-CM | POA: Diagnosis not present

## 2017-08-17 DIAGNOSIS — M6281 Muscle weakness (generalized): Secondary | ICD-10-CM | POA: Diagnosis not present

## 2017-08-17 NOTE — Therapy (Signed)
[facility] [address] [place], [place], [address] Phone: [phone]   Fax:  [phone]  Physical Therapy Treatment  Patient Details  Name: Michaela Rhodes MRN: [id number] Date of Birth: 01-Oct-1936 Referring Provider: Dr. Carolann Littler  Encounter Date: 08/17/2017      PT End of Session - 08/17/17 1136    Visit Number 4   Number of Visits 10   Date for PT Re-Evaluation 09/28/17   Authorization Type used 6 visits this year so KX modifier on 9th visit; g-code on 10th visit   PT Start Time 1100   PT Stop Time 1142   PT Time Calculation (min) 42 min   Activity Tolerance Patient tolerated treatment well   Behavior During Therapy [facility] for tasks assessed/performed      Past Medical History:  Diagnosis Date  . Cancer [facility])    breast cancer  . DM2 (diabetes mellitus, type 2) ([place])   . Hyperlipidemia   . Hypertension   . Hypothyroidism   . Panic attacks    mild  . SVT (supraventricular tachycardia) (HCC)    in the past  . Wears glasses     Past Surgical History:  Procedure Laterality Date  . ABDOMINAL HYSTERECTOMY     BSO as well  . APPENDECTOMY    . APPLICATION OF A-CELL OF EXTREMITY Left 01/15/2015   Procedure: APPLICATION OF A-CELL OF EXTREMITY;  Surgeon: Theodoro Kos, DO;  Location: [place];  Service: Plastics;  Laterality: Left;  . CHOLECYSTECTOMY    . I&D EXTREMITY Left 01/15/2015   Procedure: IRRIGATION AND DEBRIDEMENT EXTREMITY;  Surgeon: Theodoro Kos, DO;  Location: [facility];  Service: Plastics;  Laterality: Left;  . IR KYPHO EA ADDL LEVEL THORACIC OR LUMBAR  05/10/2017  . IR KYPHO THORACIC WITH BONE BIOPSY  05/10/2017  . IR RADIOLOGIST EVAL & MGMT  04/28/2017  . MASTECTOMY  1985   Left  . RECONSTRUCTION BREAST W/ LATISSIMUS DORSI FLAP    . TOTAL SHOULDER ARTHROPLASTY  05/27/2012   Procedure: TOTAL SHOULDER ARTHROPLASTY;  Surgeon: Johnny Bridge, MD;  Location:  [place];  Service: Orthopedics;  Laterality: Right;    There were no vitals filed for this visit.      Subjective Assessment - 08/17/17 1102    Subjective I walked on a hill with forward posture and increased some pain. Patient slept in her bed the past 2 nights but hard getting out of bed.    Limitations Walking;Standing   Patient Stated Goals reduce pain; work on sleeping position in bed   Pain Score 7    Pain Location Back   Pain Orientation Mid;Right   Pain Descriptors / Indicators Dull   Pain Type Chronic pain   Pain Onset More than a month ago   Pain Frequency Intermittent   Aggravating Factors  walking and standing   Pain Relieving Factors sit and be still   Multiple Pain Sites No                         [facility] Adult PT Treatment/Exercise - 08/17/17 0001      Neck Exercises: Theraband   Rows 20 reps   Rows Limitations yellow theraband   Shoulder External Rotation 20 reps   Shoulder External Rotation Limitations yellow theraband   Horizontal ABduction 20 reps   Horizontal ABduction Limitations yellow theraband in supine     Lumbar Exercises: Supine  Ab Set 10 reps;5 seconds   Clam 10 reps  bil. with abdominal bracing     Knee/Hip Exercises: Aerobic   Nustep Level 2 x 10 minutes; seat#8, arms #10     Knee/Hip Exercises: Supine   Quad Sets Strengthening;Right;Left;1 set;10 reps  hold 5 sec   Straight Leg Raises Strengthening;Right;Left;10 reps   Other Supine Knee/Hip Exercises gluteal squeeze 5 sec for 5x                  PT Short Term Goals - 08/15/17 0817      PT SHORT TERM GOAL #1   Title independent with initial HEP   Status Achieved     PT SHORT TERM GOAL #2   Title able to sleep in her bed due to being able to get in and out of bed with proper mechanics and lay in bed   Baseline sleeping on couch   Time 4   Period Weeks   Status On-going     PT SHORT TERM GOAL #3   Title ability to walk with upright posture with pain  decreased >/= 25%   Baseline no pain today, difficulty with upright posture   Time 4   Period Weeks   Status On-going           PT Long Term Goals - 08/03/17 0925      PT LONG TERM GOAL #1   Title independent with advanced HEP   Time 8   Period Weeks   Status New   Target Date 09/28/17     PT LONG TERM GOAL #2   Title FOTO < or = 37% limitation   Time 8   Period Weeks   Status New   Target Date 09/28/17     PT LONG TERM GOAL #3   Title  get in and out of bed with 50% less pain due to increased strength and improved posture   Time 8   Period Weeks   Status New   Target Date 09/28/17     PT LONG TERM GOAL #4   Title sleep in her bed all night with accommodations to her flexed posture and reduction in pain >/= 50%   Baseline --   Time 8   Period Weeks   Status New   Target Date 09/28/17     PT LONG TERM GOAL #5   Title walk for 15 minutes in the store with upright posture and pain decreased >/= 50%   Baseline --   Time 8   Period Weeks   Status New   Target Date 09/28/17               Plan - 08/17/17 1136    Clinical Impression Statement Patient is able to sleep in her bed the past 2 nights but is having trouble getting out of bed.  Patient continues to walk with a forward posture.  Patient has some trouble with using her right arm with scapula strengthening.  Patient will benefit from skilled PT for scapular stabilization, postural strength and flexibility.    Rehab Potential Excellent   Clinical Impairments Affecting Rehab Potential Breast cancer; Kypho thoracic surgery; masectomy; abdominal hysterectomy   PT Frequency 2x / week   PT Duration 8 weeks   PT Treatment/Interventions Moist Heat;Electrical Stimulation;Cryotherapy;Therapeutic activities;Therapeutic exercise;Dry needling;Neuromuscular re-education;Patient/family education;Manual techniques;Taping   PT Next Visit Plan postural strength to improve lumbar extension so she is able to lay on her  back in bed; hip stretches  for quads, hamstring, hip adductors; abdominal bracing   PT Home Exercise Plan progress as needed   Recommended Other Services MD signed initial evaluation   Consulted and Agree with Plan of Care Patient      Patient will benefit from skilled therapeutic intervention in order to improve the following deficits and impairments:  Pain, Decreased strength, Decreased mobility, Decreased activity tolerance, Decreased endurance, Decreased range of motion, Difficulty walking, Increased fascial restricitons  Visit Diagnosis: Abnormal posture  Muscle weakness (generalized)  Chronic bilateral low back pain without sciatica     Problem List Patient Active Problem List   Diagnosis Date Noted  . S/P thoracentesis   . Acute diastolic CHF (congestive heart failure) ([place])   . AKI (acute kidney injury) ([place])   . CAP (community acquired pneumonia) 07/10/2017  . Acute respiratory failure ([place]) 07/10/2017  . Diabetes mellitus with complication ([place]) 15/37/9432  . Pleural effusion on left 07/10/2017  . Lactic acidemia 07/10/2017  . Sepsis, unspecified organism ([place]) 07/10/2017  . Osteopenia 04/02/2016  . Diabetes type 2, uncontrolled ([place])   . Other specified hypothyroidism   . Acute renal failure syndrome ([place])   . Cellulitis 12/27/2014  . Cellulitis of left lower extremity 12/26/2014  . Obesity (BMI 30-39.9) 08/30/2013  . Hyperlipidemia 09/25/2012  . Breast cancer, left ([place]) 07/26/2012  . Syncope 06/26/2012  . Type 2 diabetes mellitus, uncontrolled ([place]) 05/30/2012  . Acute renal failure ([place]) 05/30/2012  . HTN (hypertension) 05/30/2012  . Postoperative anemia due to acute blood loss 05/29/2012  . Tachycardia 05/27/2012  . Hyperglycemia 05/27/2012  . Closed fracture of right proximal humerus 05/26/2012  . OVERWEIGHT 10/19/2009  . Hypothyroidism 10/16/2009  . Benign essential HTN 10/16/2009  . SVT (supraventricular tachycardia) ([place]) 10/16/2009    Earlie Counts, PT 08/17/17 11:40 AM   [facility] [address] [place], [place], [address] Phone: [phone]   Fax:  [phone]  Name: Michaela Rhodes MRN: [id number] Date of Birth: January 23, 1936

## 2017-08-22 ENCOUNTER — Ambulatory Visit: Payer: Medicare HMO | Attending: Family Medicine | Admitting: Physical Therapy

## 2017-08-22 DIAGNOSIS — R293 Abnormal posture: Secondary | ICD-10-CM

## 2017-08-22 DIAGNOSIS — M5442 Lumbago with sciatica, left side: Secondary | ICD-10-CM | POA: Insufficient documentation

## 2017-08-22 DIAGNOSIS — M545 Low back pain, unspecified: Secondary | ICD-10-CM

## 2017-08-22 DIAGNOSIS — G8929 Other chronic pain: Secondary | ICD-10-CM | POA: Diagnosis not present

## 2017-08-22 DIAGNOSIS — M5441 Lumbago with sciatica, right side: Secondary | ICD-10-CM | POA: Diagnosis not present

## 2017-08-22 DIAGNOSIS — M6281 Muscle weakness (generalized): Secondary | ICD-10-CM | POA: Insufficient documentation

## 2017-08-22 DIAGNOSIS — R262 Difficulty in walking, not elsewhere classified: Secondary | ICD-10-CM | POA: Diagnosis not present

## 2017-08-22 NOTE — Therapy (Signed)
[facility] [address] [place], [place], [address] Phone: [phone]   Fax:  [phone]  Physical Therapy Treatment  Patient Details  Name: Michaela Rhodes MRN: [id number] Date of Birth: 26-Mar-1936 Referring Provider: Dr. Carolann Littler  Encounter Date: 08/22/2017      PT End of Session - 08/22/17 1659    Visit Number 5   Number of Visits 10   Date for PT Re-Evaluation 09/28/17   Authorization Type used 6 visits this year so KX modifier on 9th visit; g-code on 10th visit   PT Start Time 1532   PT Stop Time 1617   PT Time Calculation (min) 45 min   Activity Tolerance Patient tolerated treatment well;No increased pain   Behavior During Therapy [facility] for tasks assessed/performed      Past Medical History:  Diagnosis Date  . Cancer [facility])    breast cancer  . DM2 (diabetes mellitus, type 2) ([place])   . Hyperlipidemia   . Hypertension   . Hypothyroidism   . Panic attacks    mild  . SVT (supraventricular tachycardia) (H[facility])    in the past  . Wears glasses     Past Surgical History:  Procedure Laterality Date  . ABDOMINAL HYSTERECTOMY     BSO as well  . APPENDECTOMY    . APPLICATION OF A-CELL OF EXTREMITY Left 01/15/2015   Procedure: APPLICATION OF A-CELL OF EXTREMITY;  Surgeon: Theodoro Kos, DO;  Location: [facility];  Service: Plastics;  Laterality: Left;  . CHOLECYSTECTOMY    . I&D EXTREMITY Left 01/15/2015   Procedure: IRRIGATION AND DEBRIDEMENT EXTREMITY;  Surgeon: Theodoro Kos, DO;  Location: [facility];  Service: Plastics;  Laterality: Left;  . IR KYPHO EA ADDL LEVEL THORACIC OR LUMBAR  05/10/2017  . IR KYPHO THORACIC WITH BONE BIOPSY  05/10/2017  . IR RADIOLOGIST EVAL & MGMT  04/28/2017  . MASTECTOMY  1985   Left  . RECONSTRUCTION BREAST W/ LATISSIMUS DORSI FLAP    . TOTAL SHOULDER ARTHROPLASTY  05/27/2012   Procedure: TOTAL SHOULDER ARTHROPLASTY;  Surgeon: Johnny Bridge, MD;  Location: [facility];  Service: Orthopedics;  Laterality: Right;    There were no vitals filed for this visit.      Subjective Assessment - 08/22/17 1538    Subjective Pt arrived stating that she has been moving lately and got a little stiff with the increase in activity. She continues to try to work on her exercises as much as she can.    Limitations Walking;Standing   Patient Stated Goals reduce pain; work on sleeping position in bed   Currently in Pain? No/denies   Pain Onset More than a month ago                 [facility] Adult PT Treatment/Exercise - 08/22/17 0001      Neck Exercises: Seated   Other Seated Exercise shoulder rows (low) 2x15 reps; high rows 2x15 reps with green TB      Knee/Hip Exercises: Stretches   Hip Flexor Stretch Both;3 reps;30 seconds   Hip Flexor Stretch Limitations wedge behind back and contralateral hip flexion   Other Knee/Hip Stretches standing hip flexor stretch on 2nd step 3x30 sec each      Knee/Hip Exercises: Standing   Hip Extension Both;10 reps;2 sets   Extension Limitations facing countertop to prevent hip flexion compensation      Knee/Hip Exercises: Supine   Other Supine Knee/Hip  Exercises BUE pressdown with bent knee fallout x10 reps, 3 sec hold each                 PT Education - 08/22/17 1614    Education provided Yes   Education Details technique with therex    Person(s) Educated Patient   Methods Explanation;Demonstration;Verbal cues   Comprehension Verbalized understanding;Returned demonstration          PT Short Term Goals - 08/15/17 0817      PT SHORT TERM GOAL #1   Title independent with initial HEP   Status Achieved     PT SHORT TERM GOAL #2   Title able to sleep in her bed due to being able to get in and out of bed with proper mechanics and lay in bed   Baseline sleeping on couch   Time 4   Period Weeks   Status On-going     PT SHORT TERM GOAL #3   Title ability to walk with upright  posture with pain decreased >/= 25%   Baseline no pain today, difficulty with upright posture   Time 4   Period Weeks   Status On-going           PT Long Term Goals - 08/03/17 0925      PT LONG TERM GOAL #1   Title independent with advanced HEP   Time 8   Period Weeks   Status New   Target Date 09/28/17     PT LONG TERM GOAL #2   Title FOTO < or = 37% limitation   Time 8   Period Weeks   Status New   Target Date 09/28/17     PT LONG TERM GOAL #3   Title  get in and out of bed with 50% less pain due to increased strength and improved posture   Time 8   Period Weeks   Status New   Target Date 09/28/17     PT LONG TERM GOAL #4   Title sleep in her bed all night with accommodations to her flexed posture and reduction in pain >/= 50%   Baseline --   Time 8   Period Weeks   Status New   Target Date 09/28/17     PT LONG TERM GOAL #5   Title walk for 15 minutes in the store with upright posture and pain decreased >/= 50%   Baseline --   Time 8   Period Weeks   Status New   Target Date 09/28/17               Plan - 08/22/17 1659    Clinical Impression Statement Continued focus on therex to improve hip extension mobility and muscle activation. Pt requiring verbal/tactile cues to improve technique with several exercises secondary to her flexed posturing, however she was able to make the proper adjustments as requested. Ended session without report of increased pain or stiffness. Will continue with current POC to improve flexibility, strength and postural control.   Rehab Potential Excellent   Clinical Impairments Affecting Rehab Potential Breast cancer; Kypho thoracic surgery; masectomy; abdominal hysterectomy   PT Frequency 2x / week   PT Duration 8 weeks   PT Treatment/Interventions Moist Heat;Electrical Stimulation;Cryotherapy;Therapeutic activities;Therapeutic exercise;Dry needling;Neuromuscular re-education;Patient/family education;Manual techniques;Taping    PT Next Visit Plan continue to work on postural strength to improve lumbar extension so she is able to lay on her back in bed; hip stretches for quads, hip adductors; abdominal bracing  PT Home Exercise Plan update next session    Consulted and Agree with Plan of Care Patient      Patient will benefit from skilled therapeutic intervention in order to improve the following deficits and impairments:  Pain, Decreased strength, Decreased mobility, Decreased activity tolerance, Decreased endurance, Decreased range of motion, Difficulty walking, Increased fascial restricitons  Visit Diagnosis: Abnormal posture  Muscle weakness (generalized)  Chronic bilateral low back pain without sciatica  Acute bilateral low back pain with bilateral sciatica  Difficulty in walking, not elsewhere classified     Problem List Patient Active Problem List   Diagnosis Date Noted  . S/P thoracentesis   . Acute diastolic CHF (congestive heart failure) ([place])   . AKI (acute kidney injury) ([place])   . CAP (community acquired pneumonia) 07/10/2017  . Acute respiratory failure ([place]) 07/10/2017  . Diabetes mellitus with complication ([place]) 68/07/8109  . Pleural effusion on left 07/10/2017  . Lactic acidemia 07/10/2017  . Sepsis, unspecified organism ([place]) 07/10/2017  . Osteopenia 04/02/2016  . Diabetes type 2, uncontrolled ([place])   . Other specified hypothyroidism   . Acute renal failure syndrome ([place])   . Cellulitis 12/27/2014  . Cellulitis of left lower extremity 12/26/2014  . Obesity (BMI 30-39.9) 08/30/2013  . Hyperlipidemia 09/25/2012  . Breast cancer, left ([place]) 07/26/2012  . Syncope 06/26/2012  . Type 2 diabetes mellitus, uncontrolled ([place]) 05/30/2012  . Acute renal failure ([place]) 05/30/2012  . HTN (hypertension) 05/30/2012  . Postoperative anemia due to acute blood loss 05/29/2012  . Tachycardia 05/27/2012  . Hyperglycemia 05/27/2012  . Closed fracture of right proximal humerus 05/26/2012  .  OVERWEIGHT 10/19/2009  . Hypothyroidism 10/16/2009  . Benign essential HTN 10/16/2009  . SVT (supraventricular tachycardia) ([place]) 10/16/2009    Elly Modena PT, DPT 08/22/2017, 5:03 PM  [facility] [address] [place], [place], [address] Phone: [phone]   Fax:  [phone]  Name: SYDNI ELIZARRARAZ MRN: [id number] Date of Birth: December 11, 1936

## 2017-08-24 ENCOUNTER — Ambulatory Visit: Payer: Medicare HMO | Admitting: Physical Therapy

## 2017-08-24 DIAGNOSIS — G8929 Other chronic pain: Secondary | ICD-10-CM | POA: Diagnosis not present

## 2017-08-24 DIAGNOSIS — M5441 Lumbago with sciatica, right side: Secondary | ICD-10-CM

## 2017-08-24 DIAGNOSIS — M5442 Lumbago with sciatica, left side: Secondary | ICD-10-CM | POA: Diagnosis not present

## 2017-08-24 DIAGNOSIS — R262 Difficulty in walking, not elsewhere classified: Secondary | ICD-10-CM | POA: Diagnosis not present

## 2017-08-24 DIAGNOSIS — M6281 Muscle weakness (generalized): Secondary | ICD-10-CM

## 2017-08-24 DIAGNOSIS — R293 Abnormal posture: Secondary | ICD-10-CM | POA: Diagnosis not present

## 2017-08-24 DIAGNOSIS — M545 Low back pain: Secondary | ICD-10-CM | POA: Diagnosis not present

## 2017-08-24 NOTE — Therapy (Signed)
[facility] [address], [place], [place], [address] Phone: [phone]   Fax:  [phone]  Physical Therapy Treatment  Patient Details  Name: Michaela Rhodes MRN: [id number] Date of Birth: 1936/11/22 Referring Provider: Dr. Carolann Littler  Encounter Date: 08/24/2017      PT End of Session - 08/24/17 0809    Visit Number 6   Number of Visits 10   Date for PT Re-Evaluation 09/28/17   Authorization Type used 6 visits this year so KX modifier on 9th visit; g-code on 10th visit   PT Start Time 0802   PT Stop Time 0841   PT Time Calculation (min) 39 min   Activity Tolerance Patient tolerated treatment well;No increased pain   Behavior During Therapy WFL for tasks assessed/performed      Past Medical History:  Diagnosis Date  . Cancer [facility])    breast cancer  . DM2 (diabetes mellitus, type 2) ([place])   . Hyperlipidemia   . Hypertension   . Hypothyroidism   . Panic attacks    mild  . SVT (supraventricular tachycardia) (HCC)    in the past  . Wears glasses     Past Surgical History:  Procedure Laterality Date  . ABDOMINAL HYSTERECTOMY     BSO as well  . APPENDECTOMY    . APPLICATION OF A-CELL OF EXTREMITY Left 01/15/2015   Procedure: APPLICATION OF A-CELL OF EXTREMITY;  Surgeon: Theodoro Kos, DO;  Location: [place];  Service: Plastics;  Laterality: Left;  . CHOLECYSTECTOMY    . I&D EXTREMITY Left 01/15/2015   Procedure: IRRIGATION AND DEBRIDEMENT EXTREMITY;  Surgeon: Theodoro Kos, DO;  Location: [place];  Service: Plastics;  Laterality: Left;  . IR KYPHO EA ADDL LEVEL THORACIC OR LUMBAR  05/10/2017  . IR KYPHO THORACIC WITH BONE BIOPSY  05/10/2017  . IR RADIOLOGIST EVAL & MGMT  04/28/2017  . MASTECTOMY  1985   Left  . RECONSTRUCTION BREAST W/ LATISSIMUS DORSI FLAP    . TOTAL SHOULDER ARTHROPLASTY  05/27/2012   Procedure: TOTAL SHOULDER ARTHROPLASTY;  Surgeon: Johnny Bridge, MD;  Location: [place];  Service: Orthopedics;  Laterality: Right;    There were no vitals filed for this visit.      Subjective Assessment - 08/24/17 0808    Subjective Pt arrived reporting no issues with her back, but her hips are a little tight from her session 2 days ago. She did try to complete some of her new exercises from last session which seemed to help.    Limitations Walking;Standing   Patient Stated Goals reduce pain; work on sleeping position in bed   Currently in Pain? No/denies   Pain Onset More than a month ago                         [facility] Adult PT Treatment/Exercise - 08/24/17 0001      Neck Exercises: Seated   Other Seated Exercise low rows 2x10 reps with green TB 1 set standing and 1 set sitting; high rows 2x10 reps with green TB (towel roll in low back)      Knee/Hip Exercises: Stretches   Hip Flexor Stretch 2 reps;Both;30 seconds   Hip Flexor Stretch Limitations wedge behind back and contralateral hip flexion     Knee/Hip Exercises: Aerobic   Recumbent Bike L1, x5 min  PT present to discuss progress and session objectives  Knee/Hip Exercises: Standing   Hip Extension Both;2 sets;15 reps;Knee straight   Extension Limitations facing countertop to prevent hip flexion compensation    Other Standing Knee Exercises trunk extension against countertop 10x5" hold     Knee/Hip Exercises: Supine   Other Supine Knee/Hip Exercises bent knee fallout hip adductor stretch 10x5" each LE                 PT Education - 08/24/17 0809    Education provided Yes   Education Details Reminded pt that some soreness is expected following workouts; updated and reviewed HEP    Person(s) Educated Patient   Methods Explanation;Verbal cues   Comprehension Returned demonstration;Verbalized understanding          PT Short Term Goals - 08/15/17 0817      PT SHORT TERM GOAL #1   Title independent with initial HEP   Status Achieved     PT  SHORT TERM GOAL #2   Title able to sleep in her bed due to being able to get in and out of bed with proper mechanics and lay in bed   Baseline sleeping on couch   Time 4   Period Weeks   Status On-going     PT SHORT TERM GOAL #3   Title ability to walk with upright posture with pain decreased >/= 25%   Baseline no pain today, difficulty with upright posture   Time 4   Period Weeks   Status On-going           PT Long Term Goals - 08/03/17 0925      PT LONG TERM GOAL #1   Title independent with advanced HEP   Time 8   Period Weeks   Status New   Target Date 09/28/17     PT LONG TERM GOAL #2   Title FOTO < or = 37% limitation   Time 8   Period Weeks   Status New   Target Date 09/28/17     PT LONG TERM GOAL #3   Title  get in and out of bed with 50% less pain due to increased strength and improved posture   Time 8   Period Weeks   Status New   Target Date 09/28/17     PT LONG TERM GOAL #4   Title sleep in her bed all night with accommodations to her flexed posture and reduction in pain >/= 50%   Baseline --   Time 8   Period Weeks   Status New   Target Date 09/28/17     PT LONG TERM GOAL #5   Title walk for 15 minutes in the store with upright posture and pain decreased >/= 50%   Baseline --   Time 8   Period Weeks   Status New   Target Date 09/28/17               Plan - 08/24/17 9147    Clinical Impression Statement Pt is making steady progress towards goals with consistent HEP adherence at home in addition to reported improvements in back stiffness upon arrival to today's session. Focused today's session on updating and reviewing technique with pt's home program. She was able to demonstrate good understanding of all exercises, reporting no significant fatigue or soreness by the end of today's session. Will continue with current POC to improve posture, flexibility and activity tolerance.    Rehab Potential Excellent   Clinical Impairments Affecting  Rehab Potential Breast cancer; Kypho  thoracic surgery; masectomy; abdominal hysterectomy   PT Frequency 2x / week   PT Duration 8 weeks   PT Treatment/Interventions Moist Heat;Electrical Stimulation;Cryotherapy;Therapeutic activities;Therapeutic exercise;Dry needling;Neuromuscular re-education;Patient/family education;Manual techniques;Taping   PT Next Visit Plan continue to progress postural strength to improve lumbar extension so she is able to lay on her back in bed; hip stretches for quads, hip adductors; abdominal bracing   PT Home Exercise Plan low and high rows with green TB, supine hip flexor stretch, supine bent knee fall out stretch, standing hip extension    Consulted and Agree with Plan of Care Patient      Patient will benefit from skilled therapeutic intervention in order to improve the following deficits and impairments:  Pain, Decreased strength, Decreased mobility, Decreased activity tolerance, Decreased endurance, Decreased range of motion, Difficulty walking, Increased fascial restricitons  Visit Diagnosis: Abnormal posture  Muscle weakness (generalized)  Chronic bilateral low back pain without sciatica  Acute bilateral low back pain with bilateral sciatica  Difficulty in walking, not elsewhere classified     Problem List Patient Active Problem List   Diagnosis Date Noted  . S/P thoracentesis   . Acute diastolic CHF (congestive heart failure) ([place])   . AKI (acute kidney injury) ([place])   . CAP (community acquired pneumonia) 07/10/2017  . Acute respiratory failure ([place]) 07/10/2017  . Diabetes mellitus with complication ([place]) 94/76/5465  . Pleural effusion on left 07/10/2017  . Lactic acidemia 07/10/2017  . Sepsis, unspecified organism ([place]) 07/10/2017  . Osteopenia 04/02/2016  . Diabetes type 2, uncontrolled ([place])   . Other specified hypothyroidism   . Acute renal failure syndrome ([place])   . Cellulitis 12/27/2014  . Cellulitis of left lower extremity  12/26/2014  . Obesity (BMI 30-39.9) 08/30/2013  . Hyperlipidemia 09/25/2012  . Breast cancer, left ([place]) 07/26/2012  . Syncope 06/26/2012  . Type 2 diabetes mellitus, uncontrolled ([place]) 05/30/2012  . Acute renal failure ([place]) 05/30/2012  . HTN (hypertension) 05/30/2012  . Postoperative anemia due to acute blood loss 05/29/2012  . Tachycardia 05/27/2012  . Hyperglycemia 05/27/2012  . Closed fracture of right proximal humerus 05/26/2012  . OVERWEIGHT 10/19/2009  . Hypothyroidism 10/16/2009  . Benign essential HTN 10/16/2009  . SVT (supraventricular tachycardia) ([place]) 10/16/2009    8:43 AM,08/24/17 Elly Modena PT, DPT [place] [facility] [address], [place], [place], [address] Phone: [phone]   Fax:  [phone]  Name: Michaela Rhodes MRN: [id number] Date of Birth: [birth year]

## 2017-08-29 ENCOUNTER — Ambulatory Visit: Payer: Medicare HMO

## 2017-08-29 DIAGNOSIS — M5441 Lumbago with sciatica, right side: Secondary | ICD-10-CM | POA: Diagnosis not present

## 2017-08-29 DIAGNOSIS — M545 Low back pain, unspecified: Secondary | ICD-10-CM

## 2017-08-29 DIAGNOSIS — R262 Difficulty in walking, not elsewhere classified: Secondary | ICD-10-CM | POA: Diagnosis not present

## 2017-08-29 DIAGNOSIS — G8929 Other chronic pain: Secondary | ICD-10-CM | POA: Diagnosis not present

## 2017-08-29 DIAGNOSIS — M5442 Lumbago with sciatica, left side: Secondary | ICD-10-CM | POA: Diagnosis not present

## 2017-08-29 DIAGNOSIS — M6281 Muscle weakness (generalized): Secondary | ICD-10-CM | POA: Diagnosis not present

## 2017-08-29 DIAGNOSIS — R293 Abnormal posture: Secondary | ICD-10-CM

## 2017-08-29 NOTE — Therapy (Signed)
[facility] [address] [place], [place], [address] Phone: [phone]   Fax:  [phone]  Physical Therapy Treatment  Patient Details  Name: Michaela Rhodes MRN: [id number] Date of Birth: [birth year] Referring Provider: Dr. Carolann Littler  Encounter Date: 08/29/2017      PT End of Session - 08/29/17 0839    Visit Number 7   Number of Visits 10   Date for PT Re-Evaluation 09/28/17   Authorization Type used 6 visits this year so KX modifier on 9th visit; g-code on 10th visit   PT Start Time 0800   PT Stop Time 0840   PT Time Calculation (min) 40 min   Activity Tolerance Patient tolerated treatment well   Behavior During Therapy [facility] for tasks assessed/performed      Past Medical History:  Diagnosis Date  . Cancer [facility])    breast cancer  . DM2 (diabetes mellitus, type 2) ([place])   . Hyperlipidemia   . Hypertension   . Hypothyroidism   . Panic attacks    mild  . SVT (supraventricular tachycardia) (H[facility])    in the past  . Wears glasses     Past Surgical History:  Procedure Laterality Date  . ABDOMINAL HYSTERECTOMY     BSO as well  . APPENDECTOMY    . APPLICATION OF A-CELL OF EXTREMITY Left 01/15/2015   Procedure: APPLICATION OF A-CELL OF EXTREMITY;  Surgeon: Theodoro Kos, DO;  Location: [place];  Service: Plastics;  Laterality: Left;  . CHOLECYSTECTOMY    . I&D EXTREMITY Left 01/15/2015   Procedure: IRRIGATION AND DEBRIDEMENT EXTREMITY;  Surgeon: Theodoro Kos, DO;  Location: [place];  Service: Plastics;  Laterality: Left;  . IR KYPHO EA ADDL LEVEL THORACIC OR LUMBAR  05/10/2017  . IR KYPHO THORACIC WITH BONE BIOPSY  05/10/2017  . IR RADIOLOGIST EVAL & MGMT  04/28/2017  . MASTECTOMY  1985   Left  . RECONSTRUCTION BREAST W/ LATISSIMUS DORSI FLAP    . TOTAL SHOULDER ARTHROPLASTY  05/27/2012   Procedure: TOTAL SHOULDER ARTHROPLASTY;  Surgeon: Johnny Bridge, MD;  Location:  [place];  Service: Orthopedics;  Laterality: Right;    There were no vitals filed for this visit.      Subjective Assessment - 08/29/17 0813    Subjective Pt reports 70% overall improvement since the start of care.     Patient Stated Goals reduce pain; work on sleeping position in bed   Currently in Pain? No/denies  I feel stiff                         [facility] Adult PT Treatment/Exercise - 08/29/17 0001      Lumbar Exercises: Supine   Straight Leg Raise 20 reps  with abdominal bracing     Knee/Hip Exercises: Stretches   Active Hamstring Stretch Both;3 reps;20 seconds   Other Knee/Hip Stretches knee to chest: 3x20 seconds     Knee/Hip Exercises: Aerobic   Nustep Level 2 x 10 minutes; seat#8, arms #10     Knee/Hip Exercises: Standing   Hip Extension Both;2 sets;15 reps;Knee straight   Extension Limitations facing countertop to prevent hip flexion compensation      Shoulder Exercises: Seated   Flexion Strengthening;Both;20 reps  with emphasis on scapular position                  PT Short Term Goals - 08/29/17 3846  PT SHORT TERM GOAL #2   Title able to sleep in her bed due to being able to get in and out of bed with proper mechanics and lay in bed   Baseline 80% in bed   Time 4   Period Weeks   Status On-going     PT SHORT TERM GOAL #3   Title ability to walk with upright posture with pain decreased >/= 25%   Status Achieved           PT Long Term Goals - 08/29/17 0817      PT LONG TERM GOAL #1   Title independent with advanced HEP   Time 8   Period Weeks   Status On-going     PT LONG TERM GOAL #3   Title  get in and out of bed with 50% less pain due to increased strength and improved posture   Baseline 40% better    Time 8   Period Weeks   Status On-going     PT LONG TERM GOAL #4   Title sleep in her bed all night with accommodations to her flexed posture and reduction in pain >/= 50%   Status Achieved     PT LONG  TERM GOAL #5   Title walk for 15 minutes in the store with upright posture and pain decreased >/= 50%   Baseline use a cart   Time 8   Period Weeks   Status On-going               Plan - 08/29/17 0819    Clinical Impression Statement Pt reports 80% overall imrpovement in pain since the start of care.  Pt has been sleeping in her bed 80% of the time and reports 40% reduction in pain with getting out of bed.  Pt with improved postural awareness and is making frequent corrections at home.  Pt will benefit from skilled PT for strength, flexibility and postural endurance to improve mobility and endurance at home and in the community.     Rehab Potential Excellent   Clinical Impairments Affecting Rehab Potential Breast cancer; Kypho thoracic surgery; masectomy; abdominal hysterectomy   PT Frequency 2x / week   PT Duration 8 weeks   PT Treatment/Interventions Moist Heat;Electrical Stimulation;Cryotherapy;Therapeutic activities;Therapeutic exercise;Dry needling;Neuromuscular re-education;Patient/family education;Manual techniques;Taping   PT Next Visit Plan continue to progress postural strength to improve lumbar extension so she is able to lay on her back in bed; hip stretches for quads, hip adductors; abdominal bracing   Consulted and Agree with Plan of Care Patient      Patient will benefit from skilled therapeutic intervention in order to improve the following deficits and impairments:  Pain, Decreased strength, Decreased mobility, Decreased activity tolerance, Decreased endurance, Decreased range of motion, Difficulty walking, Increased fascial restricitons  Visit Diagnosis: Abnormal posture  Muscle weakness (generalized)  Chronic bilateral low back pain without sciatica     Problem List Patient Active Problem List   Diagnosis Date Noted  . S/P thoracentesis   . Acute diastolic CHF (congestive heart failure) ([place])   . AKI (acute kidney injury) ([place])   . CAP (community  acquired pneumonia) 07/10/2017  . Acute respiratory failure ([place]) 07/10/2017  . Diabetes mellitus with complication ([place]) 35/00/9381  . Pleural effusion on left 07/10/2017  . Lactic acidemia 07/10/2017  . Sepsis, unspecified organism ([place]) 07/10/2017  . Osteopenia 04/02/2016  . Diabetes type 2, uncontrolled ([place])   . Other specified hypothyroidism   . Acute renal failure  syndrome ([place])   . Cellulitis 12/27/2014  . Cellulitis of left lower extremity 12/26/2014  . Obesity (BMI 30-39.9) 08/30/2013  . Hyperlipidemia 09/25/2012  . Breast cancer, left ([place]) 07/26/2012  . Syncope 06/26/2012  . Type 2 diabetes mellitus, uncontrolled ([place]) 05/30/2012  . Acute renal failure ([place]) 05/30/2012  . HTN (hypertension) 05/30/2012  . Postoperative anemia due to acute blood loss 05/29/2012  . Tachycardia 05/27/2012  . Hyperglycemia 05/27/2012  . Closed fracture of right proximal humerus 05/26/2012  . OVERWEIGHT 10/19/2009  . Hypothyroidism 10/16/2009  . Benign essential HTN 10/16/2009  . SVT (supraventricular tachycardia) ([place]) 10/16/2009    Sigurd Sos, PT 08/29/17 8:42 AM  [facility] [address], [place], [place], [address] Phone: [phone]   Fax:  [phone]  Name: ANASTAZIA CREEK MRN: [id number] Date of Birth: 11-08-1936

## 2017-08-31 ENCOUNTER — Ambulatory Visit: Payer: Medicare HMO

## 2017-08-31 DIAGNOSIS — R293 Abnormal posture: Secondary | ICD-10-CM | POA: Diagnosis not present

## 2017-08-31 DIAGNOSIS — R262 Difficulty in walking, not elsewhere classified: Secondary | ICD-10-CM | POA: Diagnosis not present

## 2017-08-31 DIAGNOSIS — G8929 Other chronic pain: Secondary | ICD-10-CM | POA: Diagnosis not present

## 2017-08-31 DIAGNOSIS — M5441 Lumbago with sciatica, right side: Secondary | ICD-10-CM | POA: Diagnosis not present

## 2017-08-31 DIAGNOSIS — M6281 Muscle weakness (generalized): Secondary | ICD-10-CM

## 2017-08-31 DIAGNOSIS — M5442 Lumbago with sciatica, left side: Secondary | ICD-10-CM

## 2017-08-31 DIAGNOSIS — M545 Low back pain: Secondary | ICD-10-CM | POA: Diagnosis not present

## 2017-08-31 NOTE — Therapy (Signed)
[facility] [address] [place], [place], [address] Phone: [phone]   Fax:  [phone]  Physical Therapy Treatment  Patient Details  Name: Michaela Rhodes MRN: [id number] Date of Birth: [birth year] Referring Provider: Dr. Carolann Littler  Encounter Date: 08/31/2017      PT End of Session - 08/31/17 0839    Visit Number 8   Number of Visits 10   Date for PT Re-Evaluation 09/28/17   Authorization Type used 6 visits this year so KX modifier on 9th visit; g-code on 10th visit   PT Start Time 0800   PT Stop Time 0840   PT Time Calculation (min) 40 min   Activity Tolerance Patient tolerated treatment well   Behavior During Therapy [facility] for tasks assessed/performed      Past Medical History:  Diagnosis Date  . Cancer [facility])    breast cancer  . DM2 (diabetes mellitus, type 2) ([place])   . Hyperlipidemia   . Hypertension   . Hypothyroidism   . Panic attacks    mild  . SVT (supraventricular tachycardia) (H[facility])    in the past  . Wears glasses     Past Surgical History:  Procedure Laterality Date  . ABDOMINAL HYSTERECTOMY     BSO as well  . APPENDECTOMY    . APPLICATION OF A-CELL OF EXTREMITY Left 01/15/2015   Procedure: APPLICATION OF A-CELL OF EXTREMITY;  Surgeon: Theodoro Kos, DO;  Location: [place];  Service: Plastics;  Laterality: Left;  . CHOLECYSTECTOMY    . I&D EXTREMITY Left 01/15/2015   Procedure: IRRIGATION AND DEBRIDEMENT EXTREMITY;  Surgeon: Theodoro Kos, DO;  Location: [place];  Service: Plastics;  Laterality: Left;  . IR KYPHO EA ADDL LEVEL THORACIC OR LUMBAR  05/10/2017  . IR KYPHO THORACIC WITH BONE BIOPSY  05/10/2017  . IR RADIOLOGIST EVAL & MGMT  04/28/2017  . MASTECTOMY  1985   Left  . RECONSTRUCTION BREAST W/ LATISSIMUS DORSI FLAP    . TOTAL SHOULDER ARTHROPLASTY  05/27/2012   Procedure: TOTAL SHOULDER ARTHROPLASTY;  Surgeon: Johnny Bridge, MD;  Location:  [place];  Service: Orthopedics;  Laterality: Right;    There were no vitals filed for this visit.      Subjective Assessment - 08/31/17 0805    Subjective Pt has been stretching.     Currently in Pain? No/denies  Pt feels stiff this morning                         [facility] Adult PT Treatment/Exercise - 08/31/17 0001      Neck Exercises: Seated   Other Seated Exercise row with yellow theraband: 2x10 with postural correction and core strength     Lumbar Exercises: Supine   Straight Leg Raise 20 reps  with abdominal bracing     Knee/Hip Exercises: Stretches   Active Hamstring Stretch Both;3 reps;20 seconds   Other Knee/Hip Stretches knee to chest: 3x20 seconds     Knee/Hip Exercises: Aerobic   Nustep Level 2 x 10 minutes; seat#8, arms #10     Knee/Hip Exercises: Standing   Hip Extension Both;2 sets;15 reps;Knee straight   Extension Limitations facing countertop to prevent hip flexion compensation      Knee/Hip Exercises: Seated   Sit to Sand 3 sets;5 reps;without UE support  seated on black mat and emphasis on abdominal bracing     Shoulder Exercises: Seated   Flexion Strengthening;Both;20  reps  with emphasis on scapular position                  PT Short Term Goals - 08/29/17 0816      PT SHORT TERM GOAL #2   Title able to sleep in her bed due to being able to get in and out of bed with proper mechanics and lay in bed   Baseline 80% in bed   Time 4   Period Weeks   Status On-going     PT SHORT TERM GOAL #3   Title ability to walk with upright posture with pain decreased >/= 25%   Status Achieved           PT Long Term Goals - 08/29/17 0817      PT LONG TERM GOAL #1   Title independent with advanced HEP   Time 8   Period Weeks   Status On-going     PT LONG TERM GOAL #3   Title  get in and out of bed with 50% less pain due to increased strength and improved posture   Baseline 40% better    Time 8   Period Weeks   Status  On-going     PT LONG TERM GOAL #4   Title sleep in her bed all night with accommodations to her flexed posture and reduction in pain >/= 50%   Status Achieved     PT LONG TERM GOAL #5   Title walk for 15 minutes in the store with upright posture and pain decreased >/= 50%   Baseline use a cart   Time 8   Period Weeks   Status On-going               Plan - 08/31/17 7829    Clinical Impression Statement Pt reports 80% overall improvement in pain since the start of care.  Pt with increased ease with getting in/out of bed with less pain.  Pt with difficulty with sit to stand and PT addressed this today with practice from an elevated surface and to improve abdominal bracing and posture/control.  Pt will benefit from skilled PT for strength, flexibility and postural endurance to improve mobility and endurance at home and in the community.     Rehab Potential Excellent   Clinical Impairments Affecting Rehab Potential Breast cancer; Kypho thoracic surgery; masectomy; abdominal hysterectomy   PT Frequency 2x / week   PT Duration 8 weeks   PT Treatment/Interventions Moist Heat;Electrical Stimulation;Cryotherapy;Therapeutic activities;Therapeutic exercise;Dry needling;Neuromuscular re-education;Patient/family education;Manual techniques;Taping   PT Next Visit Plan continue to progress postural strength to improve lumbar extension so she is able to lay on her back in bed; hip stretches for quads, hip adductors; abdominal bracing.  Work on sit to stand.   Consulted and Agree with Plan of Care Patient      Patient will benefit from skilled therapeutic intervention in order to improve the following deficits and impairments:  Pain, Decreased strength, Decreased mobility, Decreased activity tolerance, Decreased endurance, Decreased range of motion, Difficulty walking, Increased fascial restricitons  Visit Diagnosis: Abnormal posture  Muscle weakness (generalized)  Chronic bilateral low back  pain without sciatica  Acute bilateral low back pain with bilateral sciatica  Difficulty in walking, not elsewhere classified     Problem List Patient Active Problem List   Diagnosis Date Noted  . S/P thoracentesis   . Acute diastolic CHF (congestive heart failure) ([place])   . AKI (acute kidney injury) ([place])   . CAP (  community acquired pneumonia) 07/10/2017  . Acute respiratory failure ([place]) 07/10/2017  . Diabetes mellitus with complication ([place]) 78/24/2353  . Pleural effusion on left 07/10/2017  . Lactic acidemia 07/10/2017  . Sepsis, unspecified organism ([place]) 07/10/2017  . Osteopenia 04/02/2016  . Diabetes type 2, uncontrolled ([place])   . Other specified hypothyroidism   . Acute renal failure syndrome ([place])   . Cellulitis 12/27/2014  . Cellulitis of left lower extremity 12/26/2014  . Obesity (BMI 30-39.9) 08/30/2013  . Hyperlipidemia 09/25/2012  . Breast cancer, left ([place]) 07/26/2012  . Syncope 06/26/2012  . Type 2 diabetes mellitus, uncontrolled ([place]) 05/30/2012  . Acute renal failure ([place]) 05/30/2012  . HTN (hypertension) 05/30/2012  . Postoperative anemia due to acute blood loss 05/29/2012  . Tachycardia 05/27/2012  . Hyperglycemia 05/27/2012  . Closed fracture of right proximal humerus 05/26/2012  . OVERWEIGHT 10/19/2009  . Hypothyroidism 10/16/2009  . Benign essential HTN 10/16/2009  . SVT (supraventricular tachycardia) ([place]) 10/16/2009    Sigurd Sos, PT 08/31/17 8:41 AM  [facility] [address], [place], [place], [address] Phone: [phone]   Fax:  [phone]  Name: CHEMERE STEFFLER MRN: [id number] Date of Birth: Nov 12, 1936

## 2017-09-05 ENCOUNTER — Ambulatory Visit: Payer: Medicare HMO

## 2017-09-05 DIAGNOSIS — R293 Abnormal posture: Secondary | ICD-10-CM

## 2017-09-05 DIAGNOSIS — M545 Low back pain, unspecified: Secondary | ICD-10-CM

## 2017-09-05 DIAGNOSIS — M6281 Muscle weakness (generalized): Secondary | ICD-10-CM

## 2017-09-05 DIAGNOSIS — M5442 Lumbago with sciatica, left side: Secondary | ICD-10-CM | POA: Diagnosis not present

## 2017-09-05 DIAGNOSIS — G8929 Other chronic pain: Secondary | ICD-10-CM

## 2017-09-05 DIAGNOSIS — R262 Difficulty in walking, not elsewhere classified: Secondary | ICD-10-CM | POA: Diagnosis not present

## 2017-09-05 DIAGNOSIS — M5441 Lumbago with sciatica, right side: Secondary | ICD-10-CM | POA: Diagnosis not present

## 2017-09-05 NOTE — Therapy (Signed)
[facility] [address] [place], [place], [address] Phone: [phone]   Fax:  [phone]  Physical Therapy Treatment  Patient Details  Name: Michaela Rhodes MRN: [id number] Date of Birth: May 20, 1936 Referring Provider: Dr. Carolann Littler  Encounter Date: 09/05/2017      PT End of Session - 09/05/17 0841    Visit Number 9   Number of Visits 10   Date for PT Re-Evaluation 09/28/17   Authorization Type used 6 visits this year so KX modifier on 9th visit; g-code on 10th visit   PT Start Time 0800   PT Stop Time 0841   PT Time Calculation (min) 41 min   Activity Tolerance Patient tolerated treatment well   Behavior During Therapy [facility] for tasks assessed/performed      Past Medical History:  Diagnosis Date  . Cancer [facility])    breast cancer  . DM2 (diabetes mellitus, type 2) ([place])   . Hyperlipidemia   . Hypertension   . Hypothyroidism   . Panic attacks    mild  . SVT (supraventricular tachycardia) (HCC)    in the past  . Wears glasses     Past Surgical History:  Procedure Laterality Date  . ABDOMINAL HYSTERECTOMY     BSO as well  . APPENDECTOMY    . APPLICATION OF A-CELL OF EXTREMITY Left 01/15/2015   Procedure: APPLICATION OF A-CELL OF EXTREMITY;  Surgeon: Theodoro Kos, DO;  Location: [place];  Service: Plastics;  Laterality: Left;  . CHOLECYSTECTOMY    . I&D EXTREMITY Left 01/15/2015   Procedure: IRRIGATION AND DEBRIDEMENT EXTREMITY;  Surgeon: Theodoro Kos, DO;  Location: [place];  Service: Plastics;  Laterality: Left;  . IR KYPHO EA ADDL LEVEL THORACIC OR LUMBAR  05/10/2017  . IR KYPHO THORACIC WITH BONE BIOPSY  05/10/2017  . IR RADIOLOGIST EVAL & MGMT  04/28/2017  . MASTECTOMY  1985   Left  . RECONSTRUCTION BREAST W/ LATISSIMUS DORSI FLAP    . TOTAL SHOULDER ARTHROPLASTY  05/27/2012   Procedure: TOTAL SHOULDER ARTHROPLASTY;  Surgeon: Johnny Bridge, MD;  Location:  [place];  Service: Orthopedics;  Laterality: Right;    There were no vitals filed for this visit.      Subjective Assessment - 09/05/17 0804    Subjective I have been busy because I have been getting ready to move.  I have increased pain due to this.     Patient Stated Goals reduce pain; work on sleeping position in bed   Currently in Pain? Yes   Pain Score 8    Pain Location Back   Pain Orientation Mid;Right   Pain Descriptors / Indicators Dull   Pain Type Chronic pain   Pain Onset More than a month ago   Pain Frequency Intermittent   Aggravating Factors  walking and standing   Pain Relieving Factors sitting            [facility] PT Assessment - 09/05/17 0001      Observation/Other Assessments   Focus on Therapeutic Outcomes (FOTO)  44% limitation                     [facility] Adult PT Treatment/Exercise - 09/05/17 0001      Neck Exercises: Seated   Other Seated Exercise row with yellow theraband: 2x10 with postural correction and core strength     Lumbar Exercises: Supine   Straight Leg Raise 20 reps  with  abdominal bracing     Knee/Hip Exercises: Stretches   Active Hamstring Stretch Both;3 reps;20 seconds   Other Knee/Hip Stretches knee to chest: 3x20 seconds     Knee/Hip Exercises: Aerobic   Nustep Level 2 x 10 minutes; seat#8, arms #10     Knee/Hip Exercises: Standing   Hip Extension Both;2 sets;15 reps;Knee straight   Extension Limitations facing countertop to prevent hip flexion compensation      Knee/Hip Exercises: Seated   Sit to Sand 3 sets;5 reps;without UE support  seated on black mat and emphasis on abdominal bracing     Shoulder Exercises: Seated   Flexion Strengthening;Both;20 reps  with emphasis on scapular position   Abduction Strengthening;Both  scaption 2x10                  PT Short Term Goals - 09/05/17 9702      PT SHORT TERM GOAL #2   Title able to sleep in her bed due to being able to get in and out of bed with  proper mechanics and lay in bed   Status Achieved     PT SHORT TERM GOAL #3   Title ability to walk with upright posture with pain decreased >/= 25%   Status Achieved           PT Long Term Goals - 09/05/17 0813      PT LONG TERM GOAL #1   Title independent with advanced HEP   Time 8   Period Weeks   Status On-going     PT LONG TERM GOAL #2   Title FOTO < or = 37% limitation   Baseline 44% limitation   Time 8   Period Weeks   Status On-going     PT LONG TERM GOAL #3   Title  get in and out of bed with 50% less pain due to increased strength and improved posture   Time 8   Period Weeks   Status On-going     PT LONG TERM GOAL #4   Title sleep in her bed all night with accommodations to her flexed posture and reduction in pain >/= 50%   Time 8   Period Weeks   Status On-going     PT LONG TERM GOAL #5   Title walk for 15 minutes in the store with upright posture and pain decreased >/= 50%   Baseline use a cart   Time 8   Period Weeks               Plan - 09/05/17 0815    Clinical Impression Statement Pt reports 80% overall improvement in symptoms since the start of care.  FOTO is improved to 44% limitation.  Pt with increased ease with getting in/out of bed with less pain.  Pt with improved control with sit to stand today.  Pt with continued difficulty with standing upright.  Pt will continue to benefit from skilled PT for postural strength, core strength, flexibility and functional mobility training.     PT Frequency 2x / week   PT Duration 8 weeks   PT Treatment/Interventions Moist Heat;Electrical Stimulation;Cryotherapy;Therapeutic activities;Therapeutic exercise;Dry needling;Neuromuscular re-education;Patient/family education;Manual techniques;Taping   PT Next Visit Plan Postural strength, flexibility, core strength, G-codes next   Consulted and Agree with Plan of Care Patient      Patient will benefit from skilled therapeutic intervention in order to  improve the following deficits and impairments:     Visit Diagnosis: Abnormal posture  Muscle weakness (  generalized)  Chronic bilateral low back pain without sciatica     Problem List Patient Active Problem List   Diagnosis Date Noted  . S/P thoracentesis   . Acute diastolic CHF (congestive heart failure) ([place])   . AKI (acute kidney injury) ([place])   . CAP (community acquired pneumonia) 07/10/2017  . Acute respiratory failure ([place]) 07/10/2017  . Diabetes mellitus with complication ([place]) 93/79/0240  . Pleural effusion on left 07/10/2017  . Lactic acidemia 07/10/2017  . Sepsis, unspecified organism ([place]) 07/10/2017  . Osteopenia 04/02/2016  . Diabetes type 2, uncontrolled ([place])   . Other specified hypothyroidism   . Acute renal failure syndrome ([place])   . Cellulitis 12/27/2014  . Cellulitis of left lower extremity 12/26/2014  . Obesity (BMI 30-39.9) 08/30/2013  . Hyperlipidemia 09/25/2012  . Breast cancer, left ([place]) 07/26/2012  . Syncope 06/26/2012  . Type 2 diabetes mellitus, uncontrolled ([place]) 05/30/2012  . Acute renal failure ([place]) 05/30/2012  . HTN (hypertension) 05/30/2012  . Postoperative anemia due to acute blood loss 05/29/2012  . Tachycardia 05/27/2012  . Hyperglycemia 05/27/2012  . Closed fracture of right proximal humerus 05/26/2012  . OVERWEIGHT 10/19/2009  . Hypothyroidism 10/16/2009  . Benign essential HTN 10/16/2009  . SVT (supraventricular tachycardia) ([place]) 10/16/2009    Sigurd Sos, PT 09/05/17 8:42 AM  [facility] [address], [place], [place], [address] Phone: [phone]   Fax:  [phone]  Name: Michaela Rhodes MRN: [id number] Date of Birth: Jul 31, 1936

## 2017-09-07 ENCOUNTER — Encounter: Payer: Medicare HMO | Admitting: Physical Therapy

## 2017-09-07 ENCOUNTER — Encounter: Payer: Self-pay | Admitting: Family Medicine

## 2017-09-08 ENCOUNTER — Telehealth: Payer: Self-pay | Admitting: Physical Therapy

## 2017-09-08 ENCOUNTER — Ambulatory Visit: Payer: Medicare HMO | Admitting: Physical Therapy

## 2017-09-08 NOTE — Telephone Encounter (Signed)
[place]. Pt states she accidentally overslept. She apologized and confirmed she will be here next week.  8:34 AM,09/08/17 [place], [place] at [place]

## 2017-09-12 ENCOUNTER — Ambulatory Visit: Payer: Medicare HMO

## 2017-09-12 DIAGNOSIS — G8929 Other chronic pain: Secondary | ICD-10-CM | POA: Diagnosis not present

## 2017-09-12 DIAGNOSIS — M545 Low back pain, unspecified: Secondary | ICD-10-CM

## 2017-09-12 DIAGNOSIS — M5442 Lumbago with sciatica, left side: Secondary | ICD-10-CM | POA: Diagnosis not present

## 2017-09-12 DIAGNOSIS — R293 Abnormal posture: Secondary | ICD-10-CM

## 2017-09-12 DIAGNOSIS — M6281 Muscle weakness (generalized): Secondary | ICD-10-CM

## 2017-09-12 DIAGNOSIS — R262 Difficulty in walking, not elsewhere classified: Secondary | ICD-10-CM | POA: Diagnosis not present

## 2017-09-12 DIAGNOSIS — M5441 Lumbago with sciatica, right side: Secondary | ICD-10-CM | POA: Diagnosis not present

## 2017-09-12 NOTE — Therapy (Signed)
[facility] [address] [place], [place], [address] Phone: [phone]   Fax:  [phone]  Physical Therapy Treatment  Patient Details  Name: Michaela Rhodes MRN: [id number] Date of Birth: [birth year] Referring Provider: Dr. Carolann Littler  Encounter Date: 09/12/2017      PT End of Session - 09/12/17 0840    Visit Number 10   Number of Visits 20   Date for PT Re-Evaluation 09/28/17   Authorization Type used 6 visits this year so KX modifier on 9th visit; g-code on 10th visit   PT Start Time 0800   PT Stop Time 0843   PT Time Calculation (min) 43 min   Activity Tolerance Patient tolerated treatment well   Behavior During Therapy [facility] for tasks assessed/performed      Past Medical History:  Diagnosis Date  . Cancer [facility])    breast cancer  . DM2 (diabetes mellitus, type 2) ([place])   . Hyperlipidemia   . Hypertension   . Hypothyroidism   . Panic attacks    mild  . SVT (supraventricular tachycardia) (HCC)    in the past  . Wears glasses     Past Surgical History:  Procedure Laterality Date  . ABDOMINAL HYSTERECTOMY     BSO as well  . APPENDECTOMY    . APPLICATION OF A-CELL OF EXTREMITY Left 01/15/2015   Procedure: APPLICATION OF A-CELL OF EXTREMITY;  Surgeon: Theodoro Kos, DO;  Location: [place];  Service: Plastics;  Laterality: Left;  . CHOLECYSTECTOMY    . I&D EXTREMITY Left 01/15/2015   Procedure: IRRIGATION AND DEBRIDEMENT EXTREMITY;  Surgeon: Theodoro Kos, DO;  Location: [place];  Service: Plastics;  Laterality: Left;  . IR KYPHO EA ADDL LEVEL THORACIC OR LUMBAR  05/10/2017  . IR KYPHO THORACIC WITH BONE BIOPSY  05/10/2017  . IR RADIOLOGIST EVAL & MGMT  04/28/2017  . MASTECTOMY  1985   Left  . RECONSTRUCTION BREAST W/ LATISSIMUS DORSI FLAP    . TOTAL SHOULDER ARTHROPLASTY  05/27/2012   Procedure: TOTAL SHOULDER ARTHROPLASTY;  Surgeon: Johnny Bridge, MD;  Location:  [place];  Service: Orthopedics;  Laterality: Right;    There were no vitals filed for this visit.      Subjective Assessment - 09/12/17 0806    Subjective I am stiff today.     Currently in Pain? Yes   Pain Score 1    Pain Location Back   Pain Orientation Right;Lower   Pain Descriptors / Indicators Dull   Pain Type Chronic pain   Pain Onset More than a month ago   Pain Frequency Intermittent   Aggravating Factors  walking and standing    Pain Relieving Factors sitting                         [facility] Adult PT Treatment/Exercise - 09/12/17 0001      Neck Exercises: Seated   Other Seated Exercise row with yellow theraband: 2x10 with postural correction and core strength     Lumbar Exercises: Supine   Straight Leg Raise 20 reps  with abdominal bracing     Knee/Hip Exercises: Stretches   Active Hamstring Stretch Both;3 reps;20 seconds   Other Knee/Hip Stretches knee to chest: 3x20 seconds     Knee/Hip Exercises: Aerobic   Nustep Level 2 x 10 minutes; seat#8, arms #10     Knee/Hip Exercises: Standing   Hip Extension Both;2 sets;15  reps;Knee straight   Extension Limitations facing countertop to prevent hip flexion compensation      Knee/Hip Exercises: Seated   Sit to Sand 3 sets;5 reps;without UE support  seated on black mat and emphasis on abdominal bracing                  PT Short Term Goals - 09/05/17 8182      PT SHORT TERM GOAL #2   Title able to sleep in her bed due to being able to get in and out of bed with proper mechanics and lay in bed   Status Achieved     PT SHORT TERM GOAL #3   Title ability to walk with upright posture with pain decreased >/= 25%   Status Achieved           PT Long Term Goals - 09/12/17 0807      PT LONG TERM GOAL #1   Title independent with advanced HEP   Time 8   Period Weeks   Status On-going     PT LONG TERM GOAL #2   Title FOTO < or = 37% limitation   Baseline 44% limitation   Time 8    Period Weeks   Status On-going     PT LONG TERM GOAL #3   Title  get in and out of bed with 50% less pain due to increased strength and improved posture   Baseline 40% better    Time 8   Period Weeks   Status On-going     PT LONG TERM GOAL #4   Title sleep in her bed all night with accommodations to her flexed posture and reduction in pain >/= 50%   Time 8   Period Weeks   Status On-going     PT LONG TERM GOAL #5   Title walk for 15 minutes in the store with upright posture and pain decreased >/= 50%   Baseline difficulty with upright posture   Time 8   Period Weeks   Status On-going               Plan - 09/12/17 9937    Clinical Impression Statement Pt reports 80% overall improvement in symptoms since the start of care. FOTO is improved to 44% limitation.  Pt with increased ease with getting in/out of bed with less pain.  Pt with improved LE control with sit to stand.  Pt continues to flex forward at the trunk with standing.  PT is working to improve upper back strength and core strength to improve ability to stand upright.  Pt will continue to benefit from skilled PT for postural strength. core strength, flexibility and functional mobility training.     Rehab Potential Excellent   Clinical Impairments Affecting Rehab Potential Breast cancer; Kypho thoracic surgery; masectomy; abdominal hysterectomy   PT Frequency 2x / week   PT Duration 8 weeks   PT Treatment/Interventions Moist Heat;Electrical Stimulation;Cryotherapy;Therapeutic activities;Therapeutic exercise;Dry needling;Neuromuscular re-education;Patient/family education;Manual techniques;Taping   PT Next Visit Plan Postural strength, flexibility, core strength   Consulted and Agree with Plan of Care Patient      Patient will benefit from skilled therapeutic intervention in order to improve the following deficits and impairments:  Pain, Decreased strength, Decreased mobility, Decreased activity tolerance, Decreased  endurance, Decreased range of motion, Difficulty walking, Increased fascial restricitons  Visit Diagnosis: Abnormal posture  Muscle weakness (generalized)  Chronic bilateral low back pain without sciatica       G-Codes -  09/12/17 0815    Functional Assessment Tool Used (Outpatient Only) FOTO 44% limitation   Functional Limitation Mobility: Walking and moving around   Mobility: Walking and Moving Around Current Status [phone]) At least 40 percent but less than 60 percent impaired, limited or restricted   Mobility: Walking and Moving Around Goal Status [phone]) At least 20 percent but less than 40 percent impaired, limited or restricted      Problem List Patient Active Problem List   Diagnosis Date Noted  . S/P thoracentesis   . Acute diastolic CHF (congestive heart failure) ([place])   . AKI (acute kidney injury) ([place])   . CAP (community acquired pneumonia) 07/10/2017  . Acute respiratory failure ([place]) 07/10/2017  . Diabetes mellitus with complication ([place]) 35/36/1443  . Pleural effusion on left 07/10/2017  . Lactic acidemia 07/10/2017  . Sepsis, unspecified organism ([place]) 07/10/2017  . Osteopenia 04/02/2016  . Diabetes type 2, uncontrolled ([place])   . Other specified hypothyroidism   . Acute renal failure syndrome ([place])   . Cellulitis 12/27/2014  . Cellulitis of left lower extremity 12/26/2014  . Obesity (BMI 30-39.9) 08/30/2013  . Hyperlipidemia 09/25/2012  . Breast cancer, left ([place]) 07/26/2012  . Syncope 06/26/2012  . Type 2 diabetes mellitus, uncontrolled ([place]) 05/30/2012  . Acute renal failure ([place]) 05/30/2012  . HTN (hypertension) 05/30/2012  . Postoperative anemia due to acute blood loss 05/29/2012  . Tachycardia 05/27/2012  . Hyperglycemia 05/27/2012  . Closed fracture of right proximal humerus 05/26/2012  . OVERWEIGHT 10/19/2009  . Hypothyroidism 10/16/2009  . Benign essential HTN 10/16/2009  . SVT (supraventricular tachycardia) ([place]) 10/16/2009     Sigurd Sos, PT 09/12/17 8:41 AM  [facility] [address], [place], [place], [address] Phone: [phone]   Fax:  [phone]  Name: Michaela Rhodes MRN: [id number] Date of Birth: August 11, 1936

## 2017-09-14 ENCOUNTER — Ambulatory Visit: Payer: Medicare HMO

## 2017-09-14 DIAGNOSIS — M5441 Lumbago with sciatica, right side: Secondary | ICD-10-CM | POA: Diagnosis not present

## 2017-09-14 DIAGNOSIS — M6281 Muscle weakness (generalized): Secondary | ICD-10-CM | POA: Diagnosis not present

## 2017-09-14 DIAGNOSIS — M545 Low back pain, unspecified: Secondary | ICD-10-CM

## 2017-09-14 DIAGNOSIS — R262 Difficulty in walking, not elsewhere classified: Secondary | ICD-10-CM | POA: Diagnosis not present

## 2017-09-14 DIAGNOSIS — G8929 Other chronic pain: Secondary | ICD-10-CM | POA: Diagnosis not present

## 2017-09-14 DIAGNOSIS — R293 Abnormal posture: Secondary | ICD-10-CM | POA: Diagnosis not present

## 2017-09-14 DIAGNOSIS — M5442 Lumbago with sciatica, left side: Secondary | ICD-10-CM | POA: Diagnosis not present

## 2017-09-14 NOTE — Therapy (Addendum)
[facility] [address] [place], [place], [address] Phone: [phone]   Fax:  [phone]  Physical Therapy Treatment  Patient Details  Name: Michaela Rhodes MRN: [id number] Date of Birth: 05/21/1936 Referring Provider: Dr. Carolann Littler  Encounter Date: 09/14/2017      PT End of Session - 09/14/17 0835    Visit Number 11   Number of Visits 20   Date for PT Re-Evaluation 09/28/17   Authorization Type used 6 visits this year so KX modifier on 9th visit; g-code on 10th visit   PT Start Time 0800   PT Stop Time 0839   PT Time Calculation (min) 39 min   Activity Tolerance Patient tolerated treatment well   Behavior During Therapy [facility] for tasks assessed/performed      Past Medical History:  Diagnosis Date  . Cancer [facility])    breast cancer  . DM2 (diabetes mellitus, type 2) ([place])   . Hyperlipidemia   . Hypertension   . Hypothyroidism   . Panic attacks    mild  . SVT (supraventricular tachycardia) (H[facility])    in the past  . Wears glasses     Past Surgical History:  Procedure Laterality Date  . ABDOMINAL HYSTERECTOMY     BSO as well  . APPENDECTOMY    . APPLICATION OF A-CELL OF EXTREMITY Left 01/15/2015   Procedure: APPLICATION OF A-CELL OF EXTREMITY;  Surgeon: Theodoro Kos, DO;  Location: [place];  Service: Plastics;  Laterality: Left;  . CHOLECYSTECTOMY    . I&D EXTREMITY Left 01/15/2015   Procedure: IRRIGATION AND DEBRIDEMENT EXTREMITY;  Surgeon: Theodoro Kos, DO;  Location: [place];  Service: Plastics;  Laterality: Left;  . IR KYPHO EA ADDL LEVEL THORACIC OR LUMBAR  05/10/2017  . IR KYPHO THORACIC WITH BONE BIOPSY  05/10/2017  . IR RADIOLOGIST EVAL & MGMT  04/28/2017  . MASTECTOMY  1985   Left  . RECONSTRUCTION BREAST W/ LATISSIMUS DORSI FLAP    . TOTAL SHOULDER ARTHROPLASTY  05/27/2012   Procedure: TOTAL SHOULDER ARTHROPLASTY;  Surgeon: Johnny Bridge, MD;  Location:  [place];  Service: Orthopedics;  Laterality: Right;    There were no vitals filed for this visit.      Subjective Assessment - 09/14/17 0837    Subjective I am stiff but feeling better.     Currently in Pain? No/denies                         [facility] Adult PT Treatment/Exercise - 09/14/17 0001      Neck Exercises: Seated   Other Seated Exercise row with red theraband: 2x10 with postural correction and core strength     Lumbar Exercises: Supine   Straight Leg Raise 20 reps  with abdominal bracing     Knee/Hip Exercises: Stretches   Active Hamstring Stretch Both;3 reps;20 seconds   Other Knee/Hip Stretches knee to chest: 3x20 seconds     Knee/Hip Exercises: Aerobic   Nustep Level 2 x 10 minutes; seat#8, arms #10     Knee/Hip Exercises: Standing   Hip Extension Both;2 sets;15 reps;Knee straight   Extension Limitations facing countertop to prevent hip flexion compensation      Knee/Hip Exercises: Seated   Sit to Sand 3 sets;5 reps;without UE support  seated on black mat and emphasis on abdominal bracing     Shoulder Exercises: Seated   Flexion Strengthening;Both;20 reps  with emphasis  on scapular position   Abduction Strengthening;Both  scaption 2x10     Shoulder Exercises: Standing   Extension Strengthening;Both;20 reps;Theraband   Theraband Level (Shoulder Extension) Level 2 (Red)                  PT Short Term Goals - 09/05/17 3267      PT SHORT TERM GOAL #2   Title able to sleep in her bed due to being able to get in and out of bed with proper mechanics and lay in bed   Status Achieved     PT SHORT TERM GOAL #3   Title ability to walk with upright posture with pain decreased >/= 25%   Status Achieved           PT Long Term Goals - 09/12/17 0807      PT LONG TERM GOAL #1   Title independent with advanced HEP   Time 8   Period Weeks   Status On-going     PT LONG TERM GOAL #2   Title FOTO < or = 37% limitation   Baseline 44%  limitation   Time 8   Period Weeks   Status On-going     PT LONG TERM GOAL #3   Title  get in and out of bed with 50% less pain due to increased strength and improved posture   Baseline 40% better    Time 8   Period Weeks   Status On-going     PT LONG TERM GOAL #4   Title sleep in her bed all night with accommodations to her flexed posture and reduction in pain >/= 50%   Time 8   Period Weeks   Status On-going     PT LONG TERM GOAL #5   Title walk for 15 minutes in the store with upright posture and pain decreased >/= 50%   Baseline difficulty with upright posture   Time 8   Period Weeks   Status On-going               Plan - 09/14/17 1245    Clinical Impression Statement Pt reports 80% overall improvement in symptoms since the start of care.  FOTO is improved to 44% limitation.  Pt with increased ease with getting in/out of bed with less pain.  Pt with significant improvement with control with sit to stand.  PT is continuing to improve upper back and core strength to improve upright standing.  Pt will continue to benefit from skilled PT for strength, posture and endurance.     Rehab Potential Excellent   Clinical Impairments Affecting Rehab Potential Breast cancer; Kypho thoracic surgery; masectomy; abdominal hysterectomy   PT Frequency 2x / week   PT Duration 8 weeks   PT Treatment/Interventions Moist Heat;Electrical Stimulation;Cryotherapy;Therapeutic activities;Therapeutic exercise;Dry needling;Neuromuscular re-education;Patient/family education;Manual techniques;Taping   PT Next Visit Plan Postural strength, flexibility, core strength   Consulted and Agree with Plan of Care Patient      Patient will benefit from skilled therapeutic intervention in order to improve the following deficits and impairments:  Pain, Decreased strength, Decreased mobility, Decreased activity tolerance, Decreased endurance, Decreased range of motion, Difficulty walking, Increased fascial  restricitons  Visit Diagnosis: Abnormal posture  Muscle weakness (generalized)  Chronic bilateral low back pain without sciatica     Problem List Patient Active Problem List   Diagnosis Date Noted  . S/P thoracentesis   . Acute diastolic CHF (congestive heart failure) ([place])   . AKI (acute kidney  injury) ([place])   . CAP (community acquired pneumonia) 07/10/2017  . Acute respiratory failure ([place]) 07/10/2017  . Diabetes mellitus with complication ([place]) 27/87/1836  . Pleural effusion on left 07/10/2017  . Lactic acidemia 07/10/2017  . Sepsis, unspecified organism ([place]) 07/10/2017  . Osteopenia 04/02/2016  . Diabetes type 2, uncontrolled ([place])   . Other specified hypothyroidism   . Acute renal failure syndrome ([place])   . Cellulitis 12/27/2014  . Cellulitis of left lower extremity 12/26/2014  . Obesity (BMI 30-39.9) 08/30/2013  . Hyperlipidemia 09/25/2012  . Breast cancer, left ([place]) 07/26/2012  . Syncope 06/26/2012  . Type 2 diabetes mellitus, uncontrolled ([place]) 05/30/2012  . Acute renal failure ([place]) 05/30/2012  . HTN (hypertension) 05/30/2012  . Postoperative anemia due to acute blood loss 05/29/2012  . Tachycardia 05/27/2012  . Hyperglycemia 05/27/2012  . Closed fracture of right proximal humerus 05/26/2012  . OVERWEIGHT 10/19/2009  . Hypothyroidism 10/16/2009  . Benign essential HTN 10/16/2009  . SVT (supraventricular tachycardia) ([place]) 10/16/2009     Sigurd Sos, PT 09/14/17 8:41 AM PHYSICAL THERAPY DISCHARGE SUMMARY  Visits from Start of Care: 11   Current functional level related to goals / functional outcomes: See above for current pt status.     Remaining deficits: See above.   Education / Equipment: HEP, posture/body mechanics Plan: Patient agrees to discharge.  Patient goals were partially met. Patient is being discharged due to not returning since the last visit.  ?????  Sigurd Sos, PT 10/24/17 8:36 AM           [facility]  [address], [place], [place], [address] Phone: [phone]   Fax:  [phone]  Name: SANDEE BERNATH MRN: [id number] Date of Birth: 1936-10-23

## 2017-10-23 ENCOUNTER — Ambulatory Visit: Payer: Medicare HMO | Admitting: Family Medicine

## 2017-11-25 ENCOUNTER — Other Ambulatory Visit: Payer: Self-pay | Admitting: Family Medicine

## 2018-02-13 ENCOUNTER — Other Ambulatory Visit: Payer: Self-pay | Admitting: Family Medicine

## 2018-03-14 ENCOUNTER — Other Ambulatory Visit: Payer: Self-pay | Admitting: Family Medicine

## 2018-07-03 ENCOUNTER — Other Ambulatory Visit: Payer: Self-pay | Admitting: Family Medicine

## 2018-08-28 ENCOUNTER — Other Ambulatory Visit: Payer: Self-pay | Admitting: Family Medicine

## 2018-11-06 ENCOUNTER — Other Ambulatory Visit: Payer: Self-pay | Admitting: Family Medicine

## 2018-11-29 ENCOUNTER — Emergency Department (HOSPITAL_COMMUNITY): Payer: Medicare HMO

## 2018-11-29 ENCOUNTER — Emergency Department (HOSPITAL_COMMUNITY)
Admission: EM | Admit: 2018-11-29 | Discharge: 2018-11-29 | Disposition: A | Discharge status: Home / Self Care | Payer: Medicare HMO | Attending: Emergency Medicine | Admitting: Emergency Medicine

## 2018-11-29 DIAGNOSIS — Y999 Unspecified external cause status: Secondary | ICD-10-CM | POA: Diagnosis not present

## 2018-11-29 DIAGNOSIS — S2001XA Contusion of right breast, initial encounter: Secondary | ICD-10-CM | POA: Diagnosis not present

## 2018-11-29 DIAGNOSIS — S2231XA Fracture of one rib, right side, initial encounter for closed fracture: Secondary | ICD-10-CM | POA: Insufficient documentation

## 2018-11-29 DIAGNOSIS — S3993XA Unspecified injury of pelvis, initial encounter: Secondary | ICD-10-CM | POA: Diagnosis not present

## 2018-11-29 DIAGNOSIS — N6489 Other specified disorders of breast: Secondary | ICD-10-CM

## 2018-11-29 DIAGNOSIS — S3991XA Unspecified injury of abdomen, initial encounter: Secondary | ICD-10-CM | POA: Diagnosis not present

## 2018-11-29 DIAGNOSIS — S2239XA Fracture of one rib, unspecified side, initial encounter for closed fracture: Secondary | ICD-10-CM

## 2018-11-29 DIAGNOSIS — R000 Tachycardia, unspecified: Secondary | ICD-10-CM | POA: Diagnosis not present

## 2018-11-29 DIAGNOSIS — Y929 Unspecified place or not applicable: Secondary | ICD-10-CM | POA: Insufficient documentation

## 2018-11-29 DIAGNOSIS — R079 Chest pain, unspecified: Secondary | ICD-10-CM | POA: Diagnosis not present

## 2018-11-29 DIAGNOSIS — S199XXA Unspecified injury of neck, initial encounter: Secondary | ICD-10-CM | POA: Diagnosis not present

## 2018-11-29 DIAGNOSIS — R0789 Other chest pain: Secondary | ICD-10-CM | POA: Diagnosis not present

## 2018-11-29 DIAGNOSIS — Y939 Activity, unspecified: Secondary | ICD-10-CM | POA: Diagnosis not present

## 2018-11-29 DIAGNOSIS — S0990XA Unspecified injury of head, initial encounter: Secondary | ICD-10-CM | POA: Diagnosis not present

## 2018-11-29 DIAGNOSIS — I10 Essential (primary) hypertension: Secondary | ICD-10-CM | POA: Diagnosis not present

## 2018-11-29 DIAGNOSIS — S299XXA Unspecified injury of thorax, initial encounter: Secondary | ICD-10-CM | POA: Diagnosis not present

## 2018-11-29 LAB — CBC WITH DIFFERENTIAL/PLATELET
Abs Immature Granulocytes: 0.53 10*3/uL — ABNORMAL HIGH (ref 0.00–0.07)
BASOS ABS: 0.1 10*3/uL (ref 0.0–0.1)
Basophils Relative: 0 %
Eosinophils Absolute: 0.2 10*3/uL (ref 0.0–0.5)
Eosinophils Relative: 1 %
HCT: 43.8 % (ref 36.0–46.0)
Hemoglobin: 13.4 g/dL (ref 12.0–15.0)
Immature Granulocytes: 2 %
Lymphocytes Relative: 9 %
Lymphs Abs: 1.9 10*3/uL (ref 0.7–4.0)
MCH: 26.9 pg (ref 26.0–34.0)
MCHC: 30.6 g/dL (ref 30.0–36.0)
MCV: 88 fL (ref 80.0–100.0)
Monocytes Absolute: 0.9 10*3/uL (ref 0.1–1.0)
Monocytes Relative: 4 %
Neutro Abs: 18.8 10*3/uL — ABNORMAL HIGH (ref 1.7–7.7)
Neutrophils Relative %: 84 %
Platelets: 309 10*3/uL (ref 150–400)
RBC: 4.98 MIL/uL (ref 3.87–5.11)
RDW: 12.7 % (ref 11.5–15.5)
WBC: 22.4 10*3/uL — ABNORMAL HIGH (ref 4.0–10.5)
nRBC: 0 % (ref 0.0–0.2)

## 2018-11-29 LAB — BASIC METABOLIC PANEL
Anion gap: 14 (ref 5–15)
BUN: 23 mg/dL (ref 8–23)
CALCIUM: 9.4 mg/dL (ref 8.9–10.3)
CO2: 24 mmol/L (ref 22–32)
Chloride: 102 mmol/L (ref 98–111)
Creatinine, Ser: 1.31 mg/dL — ABNORMAL HIGH (ref 0.44–1.00)
GFR calc Af Amer: 44 mL/min — ABNORMAL LOW (ref >60)
GFR calc non Af Amer: 38 mL/min — ABNORMAL LOW (ref >60)
Glucose, Bld: 383 mg/dL — ABNORMAL HIGH (ref 70–99)
Potassium: 4.3 mmol/L (ref 3.5–5.1)
Sodium: 140 mmol/L (ref 135–145)

## 2018-11-29 LAB — MAGNESIUM: Magnesium: 1.6 mg/dL — ABNORMAL LOW (ref 1.7–2.4)

## 2018-11-29 MED ORDER — IOHEXOL 300 MG/ML  SOLN
100.0000 mL | Freq: Once | INTRAMUSCULAR | Status: AC | PRN
  Administered 2018-11-29: 100 mL via INTRAVENOUS

## 2018-11-29 MED ORDER — HYDROCODONE-ACETAMINOPHEN 5-325 MG PO TABS: 2.0000 | ORAL_TABLET | Freq: Four times a day (QID) | ORAL | 0 refills | Status: DC | PRN

## 2018-11-29 MED ORDER — SODIUM CHLORIDE 0.9 % IV BOLUS
1000.0000 mL | Freq: Once | INTRAVENOUS | Status: AC
  Administered 2018-11-29: 1000 mL via INTRAVENOUS

## 2018-11-29 MED ORDER — FENTANYL CITRATE (PF) 100 MCG/2ML IJ SOLN
50.0000 ug | Freq: Once | INTRAMUSCULAR | Status: AC
  Administered 2018-11-29: 50 ug via INTRAVENOUS
  Filled 2018-11-29: qty 2

## 2018-11-29 MED ORDER — METOPROLOL TARTRATE 25 MG PO TABS
50.0000 mg | ORAL_TABLET | Freq: Once | ORAL | Status: AC
  Administered 2018-11-29: 50 mg via ORAL
  Filled 2018-11-29: qty 2

## 2018-11-29 MED ORDER — ADENOSINE 6 MG/2ML IV SOLN: 6.0000 mg | Freq: Once | INTRAVENOUS | Status: DC

## 2018-11-29 MED ORDER — METOPROLOL TARTRATE 5 MG/5ML IV SOLN
5.0000 mg | Freq: Once | INTRAVENOUS | Status: AC
  Administered 2018-11-29: 5 mg via INTRAVENOUS
  Filled 2018-11-29: qty 5

## 2018-11-29 MED ORDER — HYDROCODONE-ACETAMINOPHEN 5-325 MG PO TABS
1.0000 | ORAL_TABLET | Freq: Once | ORAL | Status: AC
  Administered 2018-11-29: 1 via ORAL
  Filled 2018-11-29: qty 1

## 2018-11-29 MED ORDER — ACETAMINOPHEN 325 MG PO TABS
650.0000 mg | ORAL_TABLET | Freq: Once | ORAL | Status: AC
  Administered 2018-11-29: 650 mg via ORAL
  Filled 2018-11-29: qty 2

## 2018-11-29 NOTE — ED Notes (Signed)
Please have the visitor with the pt call their sister about the pt

## 2018-11-29 NOTE — ED Notes (Signed)
Pt to CT with transport.

## 2018-11-29 NOTE — ED Notes (Signed)
Significant swelling, bruising, and induration to right breast with pain. Hx of breast cancer and subsequent surgery, however implant is only present to left breast. Patient reports breasts are normally symmetric, soft, and non-tender.

## 2018-11-29 NOTE — ED Notes (Signed)
Incorrect v/s recorded at this time have been deleted.

## 2018-11-29 NOTE — ED Notes (Signed)
ED Provider at bedside. 

## 2018-11-29 NOTE — ED Notes (Signed)
MD Curatolo downgrading to non-trauma activation.
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
## 2018-11-29 NOTE — Progress Notes (Signed)
Orthopedic Tech Progress Note Patient Details:  Michaela Rhodes 1936/12/02 [id number]  Trauma page waited 20 minutes  Patient ID: Michaela Rhodes, female   DOB: [birth year], 82 y.o.   MRN: [id number]   Janit Pagan 11/29/2018, 5:32 PM
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
## 2018-12-02 ENCOUNTER — Other Ambulatory Visit: Payer: Self-pay | Admitting: Family Medicine

## 2018-12-17 ENCOUNTER — Other Ambulatory Visit: Payer: Self-pay

## 2018-12-17 ENCOUNTER — Ambulatory Visit (INDEPENDENT_AMBULATORY_CARE_PROVIDER_SITE_OTHER): Payer: Medicare HMO | Admitting: Family Medicine

## 2018-12-17 ENCOUNTER — Encounter: Payer: Self-pay | Admitting: Family Medicine

## 2018-12-17 VITALS — BP 138/84 | HR 68 | Temp 98.2°F | Ht 62.5 in | Wt 219.1 lb

## 2018-12-17 DIAGNOSIS — E039 Hypothyroidism, unspecified: Secondary | ICD-10-CM

## 2018-12-17 DIAGNOSIS — E785 Hyperlipidemia, unspecified: Secondary | ICD-10-CM

## 2018-12-17 DIAGNOSIS — S2001XA Contusion of right breast, initial encounter: Secondary | ICD-10-CM | POA: Diagnosis not present

## 2018-12-17 DIAGNOSIS — S2231XA Fracture of one rib, right side, initial encounter for closed fracture: Secondary | ICD-10-CM | POA: Diagnosis not present

## 2018-12-17 DIAGNOSIS — E1165 Type 2 diabetes mellitus with hyperglycemia: Secondary | ICD-10-CM

## 2018-12-17 LAB — POCT GLYCOSYLATED HEMOGLOBIN (HGB A1C): Hemoglobin A1C: 9.2 % — AB (ref 4.0–5.6)

## 2018-12-17 LAB — TSH: TSH: 3.51 u[IU]/mL (ref 0.35–4.50)

## 2018-12-17 LAB — LIPID PANEL
Cholesterol: 216 mg/dL — ABNORMAL HIGH (ref 0–200)
HDL: 43.8 mg/dL (ref >39.00)
NONHDL: 172.5
Total CHOL/HDL Ratio: 5
Triglycerides: 282 mg/dL — ABNORMAL HIGH (ref 0.0–149.0)
VLDL: 56.4 mg/dL — AB (ref 0.0–40.0)

## 2018-12-17 LAB — HEPATIC FUNCTION PANEL
ALT: 12 U/L (ref 0–35)
AST: 12 U/L (ref 0–37)
Albumin: 4.2 g/dL (ref 3.5–5.2)
Alkaline Phosphatase: 268 U/L — ABNORMAL HIGH (ref 39–117)
BILIRUBIN DIRECT: 0.1 mg/dL (ref 0.0–0.3)
BILIRUBIN TOTAL: 0.5 mg/dL (ref 0.2–1.2)
Total Protein: 6.7 g/dL (ref 6.0–8.3)

## 2018-12-17 LAB — LDL CHOLESTEROL, DIRECT: Direct LDL: 117 mg/dL

## 2018-12-17 MED ORDER — METOPROLOL TARTRATE 50 MG PO TABS: 50.0000 mg | ORAL_TABLET | Freq: Two times a day (BID) | ORAL | 3 refills | Status: DC

## 2018-12-17 MED ORDER — LISINOPRIL-HYDROCHLOROTHIAZIDE 20-25 MG PO TABS: 1.0000 | ORAL_TABLET | Freq: Every day | ORAL | 3 refills | Status: DC

## 2018-12-17 MED ORDER — METFORMIN HCL ER 750 MG PO TB24
ORAL_TABLET | ORAL | 3 refills | Status: DC
Start: 2018-12-17 — End: 2020-03-30

## 2018-12-17 MED ORDER — LEVOTHYROXINE SODIUM 137 MCG PO TABS: ORAL_TABLET | ORAL | 3 refills | Status: DC

## 2018-12-17 MED ORDER — AMLODIPINE BESYLATE 5 MG PO TABS: 5.0000 mg | ORAL_TABLET | Freq: Every day | ORAL | 3 refills | Status: DC

## 2018-12-17 MED ORDER — PRAVASTATIN SODIUM 40 MG PO TABS: 40.0000 mg | ORAL_TABLET | Freq: Every day | ORAL | 3 refills | Status: DC

## 2018-12-17 MED ORDER — GLIMEPIRIDE 2 MG PO TABS: ORAL_TABLET | ORAL | 3 refills | Status: DC
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
## 2018-12-17 NOTE — Patient Instructions (Addendum)
Hematoma A hematoma is a collection of blood under the skin, in an organ, in a body space, in a joint space, or in other tissue. The blood can thicken (clot) to form a lump that you can see and feel. The lump is often firm and may become sore and tender. Most hematomas get better in a few days to weeks. However, some hematomas may be serious and require medical care. Hematomas can range from very small to very large. What are the causes? This condition is caused by:  A blunt or penetrating injury.  A leakage from a blood vessel under the skin.  Some medical procedures, including surgeries, such as oral surgery, face lifts, and surgeries on the joints.  Some medical conditions that cause bleeding or bruising. There may be multiple hematomas that appear in different areas of the body. What increases the risk? You are more likely to develop this condition if:  You are an older adult.  You use blood thinners. What are the signs or symptoms?  Symptoms of this condition depend on where the hematoma is located.  Common symptoms of a hematoma that is under the skin include:  A firm lump on the body.  Pain and tenderness in the area.  Bruising. Blue, dark blue, purple-red, or yellowish skin (discoloration) may appear at the site of the hematoma if the hematoma is close to the surface of the skin. Common symptoms of a hematoma that is deep in the tissues or body spaces may be less obvious. They include:  A collection of blood in the stomach (intra-abdominal hematoma). This may cause pain in the abdomen, weakness, fainting, and shortness of breath.  A collection of blood in the head (intracranial hematoma). This may cause a headache or symptoms such as weakness, trouble speaking or understanding, or a change in consciousness. How is this diagnosed? This condition is diagnosed based on:  Your medical history.  A physical exam.  Imaging tests, such as an ultrasound or CT scan. These may  be needed if your health care provider suspects a hematoma in deeper tissues or body spaces.  Blood tests. These may be needed if your health care provider believes that the hematoma is caused by a medical condition. How is this treated? Treatment for this condition depends on the cause, size, and location of the hematoma. Treatment may include:  Doing nothing. The majority of hematomas do not need treatment as many of them go away on their own over time.  Surgery or close monitoring. This may be needed for large hematomas or hematomas that affect vital organs.  Medicines. Medicines may be given if there is an underlying medical cause for the hematoma. Follow these instructions at home: Managing pain, stiffness, and swelling   If directed, put ice on the affected area. ? Put ice in a plastic bag. ? Place a towel between your skin and the bag. ? Leave the ice on for 20 minutes, 2-3 times a day for the first couple of days.  If directed, apply heat to the affected area after applying ice for a couple of days. Use the heat source that your health care provider recommends, such as a moist heat pack or a heating pad. ? Place a towel between your skin and the heat source. ? Leave the heat on for 20-30 minutes. ? Remove the heat if your skin turns bright red. This is especially important if you are unable to feel pain, heat, or cold. You may have a greater   risk of getting burned.  Raise (elevate) the affected area above the level of your heart while you are sitting or lying down.  If told, wrap the affected area with an elastic bandage. The bandage applies pressure (compression) to the area, which may help to reduce swelling and promote healing. Do not wrap the bandage too tightly around the affected area.  If your hematoma is on a leg or foot (lower extremity) and is painful, your health care provider may recommend crutches. Use them as told by your health care provider. General  instructions  Take over-the-counter and prescription medicines only as told by your health care provider.  Keep all follow-up visits as told by your health care provider. This is important. Contact a health care provider if:  You have a fever.  The swelling or discoloration gets worse.  You develop more hematomas. Get help right away if:  Your pain is worse or your pain is not controlled with medicine.  Your skin over the hematoma breaks or starts bleeding.  Your hematoma is in your chest or abdomen and you have weakness, shortness of breath, or a change in consciousness.  You have a hematoma on your scalp that is caused by a fall or injury, and you also have: ? A headache that gets worse. ? Trouble speaking or understanding speech. ? Weakness. ? Change in alertness or consciousness. Summary  A hematoma is a collection of blood under the skin, in an organ, in a body space, in a joint space, or in other tissue.  This condition usually does not need treatment because many hematomas go away on their own over time.  Large hematomas, or those that may affect vital organs, may need surgical drainage or monitoring. If the hematoma is caused by a medical condition, medicines may be prescribed.  Get help right away if your hematoma breaks or starts to bleed, you have shortness of breath, or you have a headache or trouble speaking after a fall. This information is not intended to replace advice given to you by your health care provider. Make sure you discuss any questions you have with your health care provider. Document Released: 07/19/2004 Document Revised: 05/10/2018 Document Reviewed: 05/10/2018 Elsevier Interactive Patient Education  2019 [place] up diet and let's plan on 3 month follow up.

## 2019-03-05 ENCOUNTER — Telehealth: Payer: Self-pay | Admitting: Family Medicine
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
## 2019-03-07 NOTE — Telephone Encounter (Signed)
No phone number for Deb found to return call and notify. Pt. last DEXA in 2017, with T score of 2.4. Daily calcium and vitamin D supplementation recommended at the time by Dr. Elease Hashimoto. Pt. has appointment with PCP on 3/30; will annotate need for repeat DEXA.

## 2019-03-15 NOTE — Telephone Encounter (Signed)
Deb calling back to check status. Cb#[phone]. This is her direct line. Please advise

## 2019-03-15 NOTE — Telephone Encounter (Signed)
Phone number in basket view of message.

## 2019-03-18 ENCOUNTER — Other Ambulatory Visit: Payer: Self-pay

## 2019-03-18 ENCOUNTER — Ambulatory Visit (INDEPENDENT_AMBULATORY_CARE_PROVIDER_SITE_OTHER): Payer: Medicare HMO | Admitting: Family Medicine

## 2019-03-18 ENCOUNTER — Other Ambulatory Visit: Payer: Medicare HMO

## 2019-03-18 DIAGNOSIS — E1165 Type 2 diabetes mellitus with hyperglycemia: Secondary | ICD-10-CM

## 2019-03-18 DIAGNOSIS — E785 Hyperlipidemia, unspecified: Secondary | ICD-10-CM

## 2019-03-18 DIAGNOSIS — E039 Hypothyroidism, unspecified: Secondary | ICD-10-CM

## 2019-03-18 DIAGNOSIS — I10 Essential (primary) hypertension: Secondary | ICD-10-CM

## 2019-03-18 LAB — HEMOGLOBIN A1C: Hgb A1c MFr Bld: 9.3 % — ABNORMAL HIGH (ref 4.6–6.5)

## 2019-03-18 NOTE — Progress Notes (Signed)
Patient ID: Michaela Rhodes, female   DOB: 03-01-1936, 83 y.o.   MRN: [id number]  Virtual Visit via Video Note  I connected with Michaela Rhodes on 03/18/19 at  9:00 AM EDT by a video enabled telemedicine application and verified that I am speaking with the correct person using two identifiers.  Location patient: home Location provider:work or home office Persons participating in the virtual visit: patient, provider  I discussed the limitations of evaluation and management by telemedicine and the availability of in person appointments. The patient expressed understanding and agreed to proceed.   HPI: Patient has multiple chronic problems include history of obesity, hypertension, diastolic heart failure, type 2 diabetes, hypothyroidism, dyslipidemia, osteopenia.  She is due for medical follow-up.  She was seen in December and had A1c which was out-of-control at 9.2%.  Patient states she has lost 7 to 8 pounds since then.  She is not checking her blood sugars regularly.  She states her meter is broken.  We had recommended consideration of additional diabetes medicines back in December but she declined.  In reviewing labs at that point her thyroid was well controlled.  Her lipids were but she had been inconsistent with taking her pravastatin.  She denies any medication side effects.  No recent falls.  No dyspnea.  No chest pains.   ROS: See pertinent positives and negatives per HPI.  Past Medical History:  Diagnosis Date  . Cancer [facility])    breast cancer  . DM2 (diabetes mellitus, type 2) ([place])   . Hyperlipidemia   . Hypertension   . Hypothyroidism   . Panic attacks    mild  . SVT (supraventricular tachycardia) ([facility])    in the past  . Wears glasses     Past Surgical History:  Procedure Laterality Date  . ABDOMINAL HYSTERECTOMY     BSO as well  . APPENDECTOMY    . APPLICATION OF A-CELL OF EXTREMITY Left 01/15/2015   Procedure: APPLICATION OF A-CELL OF EXTREMITY;  Surgeon: Theodoro Kos, DO;  Location: [place];  Service: Plastics;  Laterality: Left;  . CHOLECYSTECTOMY    . I&D EXTREMITY Left 01/15/2015   Procedure: IRRIGATION AND DEBRIDEMENT EXTREMITY;  Surgeon: Theodoro Kos, DO;  Location: [place];  Service: Plastics;  Laterality: Left;  . IR KYPHO EA ADDL LEVEL THORACIC OR LUMBAR  05/10/2017  . IR KYPHO THORACIC WITH BONE BIOPSY  05/10/2017  . IR RADIOLOGIST EVAL & MGMT  04/28/2017  . MASTECTOMY  1985   Left  . RECONSTRUCTION BREAST W/ LATISSIMUS DORSI FLAP    . TOTAL SHOULDER ARTHROPLASTY  05/27/2012   Procedure: TOTAL SHOULDER ARTHROPLASTY;  Surgeon: Johnny Bridge, MD;  Location: [place];  Service: Orthopedics;  Laterality: Right;    Family History  Problem Relation Age of Onset  . Heart disease Mother   . Hypertension Mother   . Stroke Father   . Hypertension Father   . Coronary artery disease Other        family hx of  . Diabetes Other        family hx of  . Hypertension Other        family hx of  . Hypertension Sister     SOCIAL HX: non-smoker.  No ETOH.   Current Outpatient Medications:  .  amLODipine (NORVASC) 5 MG tablet, Take 1 tablet (5 mg total) by mouth daily., Disp: 90 tablet, Rfl: 3 .  aspirin EC 81 MG EC tablet, Take 1 tablet (  81 mg total) by mouth daily., Disp: , Rfl:  .  calcium-vitamin D (OSCAL WITH D) 500-200 MG-UNIT per tablet, Take 1 tablet by mouth daily., Disp: 100 tablet, Rfl: 2 .  glimepiride (AMARYL) 2 MG tablet, One po qd, Disp: 90 tablet, Rfl: 3 .  HYDROcodone-acetaminophen (NORCO/VICODIN) 5-325 MG tablet, Take 2 tablets by mouth every 6 (six) hours as needed for up to 12 doses. (Patient not taking: Reported on 12/17/2018), Disp: 10 tablet, Rfl: 0 .  levothyroxine (SYNTHROID, LEVOTHROID) 137 MCG tablet, TAKE 1 TABLET BY MOUTH ONCE DAILY BEFORE BREAKFAST, Disp: 90 tablet, Rfl: 3 .  lisinopril-hydrochlorothiazide (PRINZIDE,ZESTORETIC) 20-25 MG tablet, Take 1 tablet by mouth daily., Disp: 90  tablet, Rfl: 3 .  metFORMIN (GLUCOPHAGE-XR) 750 MG 24 hr tablet, TAKE 1 TABLET BY MOUTH TWICE DAILY AT 10AM AND 5PM, Disp: 180 tablet, Rfl: 3 .  metoprolol tartrate (LOPRESSOR) 50 MG tablet, Take 1 tablet (50 mg total) by mouth 2 (two) times daily., Disp: 180 tablet, Rfl: 3 .  pravastatin (PRAVACHOL) 40 MG tablet, Take 1 tablet (40 mg total) by mouth daily., Disp: 90 tablet, Rfl: 3  EXAM:  VITALS per patient if applicable:  GENERAL: alert, oriented, appears well and in no acute distress  HEENT: atraumatic, conjunttiva clear, no obvious abnormalities on inspection of external nose and ears  NECK: normal movements of the head and neck  LUNGS: on inspection no signs of respiratory distress, breathing rate appears normal, no obvious gross SOB, gasping or wheezing  CV: no obvious cyanosis  MS: moves all visible extremities without noticeable abnormality  PSYCH/NEURO: pleasant and cooperative, no obvious depression or anxiety, speech and thought processing grossly intact  ASSESSMENT AND PLAN:  Discussed the following assessment and plan:  #1 type 2 diabetes.  History of recent poor control  -Recheck A1c today. -Patient has had some diarrhea issues and loose stool issues with metformin.  May need to consider addition of long-acting insulin if A1c not better to goal and reducing her metformin  #2 hypertension.  #3 dyslipidemia treated with pravastatin  -Repeat lipids in follow-up around 6 months  #4 hypothyroidism.  Recent TSH at goal  -Recheck TSH at next follow-up in about 6 months     I discussed the assessment and treatment plan with the patient. The patient was provided an opportunity to ask questions and all were answered. The patient agreed with the plan and demonstrated an understanding of the instructions.   The patient was advised to call back or seek an in-person evaluation if the symptoms worsen or if the condition fails to improve as anticipated.  I provided 25  minutes of non-face-to-face time during this encounter.   Carolann Littler, MD

## 2019-03-18 NOTE — Telephone Encounter (Signed)
Called Michaela Rhodes with [employer] and [facility] to return call if she needs to  [facility] for [place] to Discuss results / PCP / recommendations / Schedule patient  Patient last had a bone density on 03/04/16 and currently on calcium and vitamin D supplements. No future bone density scheduled at this time.  CRM Created

## 2019-04-24 NOTE — Telephone Encounter (Signed)
Deb with [employer] calling and states that when she last spoke with the patient, she had mentioned wanting a bone density test. Patient states to [place] at that time that she would discuss with Dr Elease Hashimoto at next visit. Informed Deb that at this time there was not a bone density test ordered. Please advise.  CB#: [phone]

## 2019-04-25 NOTE — Telephone Encounter (Signed)
I do not see discussion of DEXA at last visit. Please advise if you want to order or if patient needs to wait or if you need another visit for this.

## 2019-04-25 NOTE — Telephone Encounter (Signed)
Go ahead and set up DEXA for [place]- we weren't getting these elective procedures but should be able to soon.  Would probably wait until about a month from now to get.

## 2019-05-21 ENCOUNTER — Other Ambulatory Visit: Payer: Self-pay

## 2019-05-21 DIAGNOSIS — M8580 Other specified disorders of bone density and structure, unspecified site: Secondary | ICD-10-CM

## 2019-05-21 NOTE — Telephone Encounter (Signed)
Bone Density test has been ordered.

## 2019-06-06 DIAGNOSIS — Z7984 Long term (current) use of oral hypoglycemic drugs: Secondary | ICD-10-CM | POA: Diagnosis not present

## 2019-06-06 DIAGNOSIS — H2513 Age-related nuclear cataract, bilateral: Secondary | ICD-10-CM | POA: Diagnosis not present

## 2019-06-06 DIAGNOSIS — E119 Type 2 diabetes mellitus without complications: Secondary | ICD-10-CM | POA: Diagnosis not present

## 2019-06-06 LAB — HM DIABETES EYE EXAM

## 2019-06-12 ENCOUNTER — Telehealth: Payer: Self-pay | Admitting: Family Medicine

## 2019-06-12 NOTE — Telephone Encounter (Signed)
Pt is calling metformin 750 mg xr has been recalled . [facility] [address]

## 2019-06-13 NOTE — Telephone Encounter (Signed)
Would go ahead and substitute for what they have in stock that is relatively equivalent.

## 2019-06-13 NOTE — Telephone Encounter (Signed)
She cannot tolerate immediate release.  What we need to know from pharmacy is #1) are there other mg doses of ER available? And #2)  Are there other extended release forms of metformin available that her insurance would cover?

## 2019-06-13 NOTE — Telephone Encounter (Signed)
Spoke with Michaela Rhodes they have went with another manufacturer whose metformin xr is not recalled. Per the pharmacy tech patient received a letter informing her to call them so they can explain this. Clinic RN called and relayed this to patient that is safe to take. Patient verbalized understanding

## 2019-06-19 DIAGNOSIS — H2512 Age-related nuclear cataract, left eye: Secondary | ICD-10-CM | POA: Diagnosis not present

## 2019-07-01 DIAGNOSIS — H2513 Age-related nuclear cataract, bilateral: Secondary | ICD-10-CM | POA: Diagnosis not present

## 2019-07-01 DIAGNOSIS — H2512 Age-related nuclear cataract, left eye: Secondary | ICD-10-CM | POA: Diagnosis not present

## 2019-07-01 DIAGNOSIS — H25812 Combined forms of age-related cataract, left eye: Secondary | ICD-10-CM | POA: Diagnosis not present

## 2019-07-15 DIAGNOSIS — H25811 Combined forms of age-related cataract, right eye: Secondary | ICD-10-CM | POA: Diagnosis not present

## 2019-07-15 DIAGNOSIS — H2511 Age-related nuclear cataract, right eye: Secondary | ICD-10-CM | POA: Diagnosis not present

## 2019-07-18 ENCOUNTER — Encounter: Payer: Self-pay | Admitting: Family Medicine

## 2019-08-20 ENCOUNTER — Other Ambulatory Visit: Payer: Self-pay

## 2019-11-18 ENCOUNTER — Encounter: Payer: Self-pay | Admitting: Family Medicine

## 2019-11-18 ENCOUNTER — Ambulatory Visit (INDEPENDENT_AMBULATORY_CARE_PROVIDER_SITE_OTHER): Payer: Medicare HMO | Admitting: Family Medicine

## 2019-11-18 ENCOUNTER — Ambulatory Visit: Payer: Self-pay | Admitting: *Deleted

## 2019-11-18 ENCOUNTER — Other Ambulatory Visit: Payer: Self-pay

## 2019-11-18 VITALS — BP 140/80 | HR 98 | Temp 98.2°F | Ht 62.5 in | Wt 224.0 lb

## 2019-11-18 DIAGNOSIS — L03116 Cellulitis of left lower limb: Secondary | ICD-10-CM

## 2019-11-18 DIAGNOSIS — R000 Tachycardia, unspecified: Secondary | ICD-10-CM | POA: Diagnosis not present

## 2019-11-18 DIAGNOSIS — E1165 Type 2 diabetes mellitus with hyperglycemia: Secondary | ICD-10-CM | POA: Diagnosis not present

## 2019-11-18 DIAGNOSIS — I10 Essential (primary) hypertension: Secondary | ICD-10-CM | POA: Diagnosis not present

## 2019-11-18 LAB — POCT GLYCOSYLATED HEMOGLOBIN (HGB A1C): Hemoglobin A1C: 11.9 % — AB (ref 4.0–5.6)

## 2019-11-18 MED ORDER — DOXYCYCLINE HYCLATE 100 MG PO CAPS: 100.0000 mg | ORAL_CAPSULE | Freq: Two times a day (BID) | ORAL | 0 refills | Status: AC

## 2019-11-18 NOTE — Telephone Encounter (Signed)
Pt called in c/o swelling in her left leg that started yesterday or the day before from the knee to her toes.   About 1/2 the size it usually is.  See triage notes  I warm transferred the call into Dr. Erick Blinks office to Arbie Cookey to be scheduled within the 24 hour protocol.     I went over the care advice with her and she was agreeable to it and the appt.  I forwarded these notes to Dr. Erick Blinks office.   Reason for Disposition . [1] MODERATE leg swelling (e.g., swelling extends up to knees) AND [2] new onset or worsening  Answer Assessment - Initial Assessment Questions 1. ONSET: "When did the swelling start?" (e.g., minutes, hours, days)     Yesterday or day before it felt like poison [place].   I took a Benadryl.   It helped with the itching but not the swelling.    I have diabetes.   Not hurt when I walk. 2. LOCATION: "What part of the leg is swollen?"  "Are both legs swollen or just one leg?"     Left leg.  Below the knee to my toes.    3. SEVERITY: "How bad is the swelling?" (e.g., localized; mild, moderate, severe)  - Localized - small area of swelling localized to one leg  - MILD pedal edema - swelling limited to foot and ankle, pitting edema < 1/4 inch (6 mm) deep, rest and elevation eliminate most or all swelling  - MODERATE edema - swelling of lower leg to knee, pitting edema > 1/4 inch (6 mm) deep, rest and elevation only partially reduce swelling  - SEVERE edema - swelling extends above knee, facial or hand swelling present      1/2 the size it normally is.     No indention when finger pressed into it.    4. REDNESS: "Does the swelling look red or infected?"     It may be paler my leg foot.   No numbness or tingling.      There is a rash.   It itches.   That's on the shin area.    There is no pain.  I don't walk a lot due to my back problems.   It does goes down over night and if my feet are elevated.  5. PAIN: "Is the swelling painful to touch?" If so, ask: "How painful is  it?"   (Scale 1-10; mild, moderate or severe)     No     I have scar tissue from an injury where I was in the hospital for an infection.    The wound is fine   No bleeding, oozing, etc. 6. FEVER: "Do you have a fever?" If so, ask: "What is it, how was it measured, and when did it start?"      No    My left leg feels warmer and stiff. No history of blood clots in legs. 7. CAUSE: "What do you think is causing the leg swelling?"     I don't know.    No yard work. 8. MEDICAL HISTORY: "Do you have a history of heart failure, kidney disease, liver failure, or cancer?"     Diabetes non insulin dependent.       I'm on metformin.   I've been on it forever.   9. RECURRENT SYMPTOM: "Have you had leg swelling before?" If so, ask: "When was the last time?" "What happened that time?"     No 10. OTHER  SYMPTOMS: "Do you have any other symptoms?" (e.g., chest pain, difficulty breathing)       None of the above 11. PREGNANCY: "Is there any chance you are pregnant?" "When was your last menstrual period?"       N?A due to age  Protocols used: LEG SWELLING AND EDEMA-A-AH

## 2019-11-18 NOTE — Progress Notes (Signed)
   Subjective:    Patient ID: Michaela Rhodes, female    DOB: 03/29/1936, 83 y.o.   MRN: [id number]  HPI Here for the onset 4 days ago of swelling, redness, and itching in the left lower leg. No recent trauma. Her legs usually do not swell. There is no pain, no fever. No chest pain or SOB. Her diabetes has not been well controlled apparently, and she does not check her glucoses at home. Her last A1c in March was 9.3. She had cellulitis of the left lower leg in 2016.    Review of Systems  Constitutional: Negative.   Respiratory: Negative.   Cardiovascular: Positive for leg swelling. Negative for chest pain and palpitations.  Gastrointestinal: Negative.   Genitourinary: Negative.        Objective:   Physical Exam Constitutional:      Appearance: Normal appearance. She is not ill-appearing.  Cardiovascular:     Rate and Rhythm: Regular rhythm. Tachycardia present.     Pulses: Normal pulses.     Heart sounds: Normal heart sounds.  Pulmonary:     Effort: Pulmonary effort is normal. No respiratory distress.     Breath sounds: Normal breath sounds. No stridor. No wheezing, rhonchi or rales.  Musculoskeletal:     Comments: Left lower leg is swollen with 4+ edema. The skin is pink and warm. There is no tenderness, no cords felt, negative Homans.   Neurological:     Mental Status: She is alert.           Assessment & Plan:  Cellulitis of the left lower leg, treat with Doxycycline. Her A1c today is 11.9 so her diabetes is very poorly controlled. I will have her follow up with Dr. Elease Hashimoto next week for the cellulitis and for medication changes for the diabetes.  Alysia Penna, MD

## 2019-11-22 ENCOUNTER — Other Ambulatory Visit: Payer: Self-pay

## 2019-11-25 ENCOUNTER — Ambulatory Visit (INDEPENDENT_AMBULATORY_CARE_PROVIDER_SITE_OTHER): Payer: Medicare HMO | Admitting: Family Medicine

## 2019-11-25 ENCOUNTER — Other Ambulatory Visit: Payer: Self-pay

## 2019-11-25 ENCOUNTER — Encounter: Payer: Self-pay | Admitting: Family Medicine

## 2019-11-25 VITALS — BP 130/80 | HR 71 | Temp 96.9°F | Ht 62.5 in | Wt 221.0 lb

## 2019-11-25 DIAGNOSIS — I10 Essential (primary) hypertension: Secondary | ICD-10-CM | POA: Diagnosis not present

## 2019-11-25 DIAGNOSIS — E785 Hyperlipidemia, unspecified: Secondary | ICD-10-CM

## 2019-11-25 DIAGNOSIS — R600 Localized edema: Secondary | ICD-10-CM | POA: Diagnosis not present

## 2019-11-25 DIAGNOSIS — E039 Hypothyroidism, unspecified: Secondary | ICD-10-CM

## 2019-11-25 DIAGNOSIS — E1165 Type 2 diabetes mellitus with hyperglycemia: Secondary | ICD-10-CM | POA: Diagnosis not present

## 2019-11-25 LAB — HEPATIC FUNCTION PANEL
ALT: 14 U/L (ref 0–35)
AST: 14 U/L (ref 0–37)
Albumin: 4.2 g/dL (ref 3.5–5.2)
Alkaline Phosphatase: 133 U/L — ABNORMAL HIGH (ref 39–117)
Bilirubin, Direct: 0.1 mg/dL (ref 0.0–0.3)
Total Bilirubin: 0.5 mg/dL (ref 0.2–1.2)
Total Protein: 6.8 g/dL (ref 6.0–8.3)

## 2019-11-25 LAB — BASIC METABOLIC PANEL
BUN: 35 mg/dL — ABNORMAL HIGH (ref 6–23)
CO2: 26 mEq/L (ref 19–32)
Calcium: 9.8 mg/dL (ref 8.4–10.5)
Chloride: 103 mEq/L (ref 96–112)
Creatinine, Ser: 1.27 mg/dL — ABNORMAL HIGH (ref 0.40–1.20)
GFR: 40.34 mL/min — ABNORMAL LOW (ref >60.00)
Glucose, Bld: 200 mg/dL — ABNORMAL HIGH (ref 70–99)
Potassium: 4.7 mEq/L (ref 3.5–5.1)
Sodium: 139 mEq/L (ref 135–145)

## 2019-11-25 LAB — LIPID PANEL
Cholesterol: 184 mg/dL (ref 0–200)
HDL: 47.7 mg/dL (ref >39.00)
NonHDL: 136.71
Total CHOL/HDL Ratio: 4
Triglycerides: 251 mg/dL — ABNORMAL HIGH (ref 0.0–149.0)
VLDL: 50.2 mg/dL — ABNORMAL HIGH (ref 0.0–40.0)

## 2019-11-25 LAB — LDL CHOLESTEROL, DIRECT: Direct LDL: 91 mg/dL

## 2019-11-25 LAB — TSH: TSH: 3.42 u[IU]/mL (ref 0.35–4.50)

## 2019-11-25 NOTE — Patient Instructions (Signed)
We are setting up venous doppler of left leg to rule out deep vein thrombosis  We will call you with the lab results and then decide on diabetes medication options after labs back.

## 2019-11-25 NOTE — Progress Notes (Signed)
Subjective:     Patient ID: Michaela Rhodes, female   DOB: May 12, 1936, 83 y.o.   MRN: [id number]  HPI   Patient is seen for follow-up regarding presumed cellulitis left leg from visit here on the 30th.  She had had complicated cellulitis back in 2016.  She was placed on doxycycline.  She thinks there is a little bit less redness of the leg but she has extensive swelling left lower extremity.  No prior history of DVT.  She said her swelling started about a week ago.  She thinks her sister may have had a DVT but is not sure.  She has had decreased activity levels over the past several months in general.  No recent leg injury.  Other issues she had elevated A1c back in March at 9.3%.  She had repeat A1c when she came in last week which was 11.9.  She is on Metformin and glimepiride.  She states she is compliant with those.  We discussed other options for her diabetes previously but her last creatinine was 1.3.  She needs follow-up of multiple labs today  She has chronic problems including history of SVT, hypertension, history of diastolic heart failure, type 2 diabetes, hypothyroidism, and remote history of breast cancer  Past Medical History:  Diagnosis Date  . Cancer [facility])    breast cancer  . DM2 (diabetes mellitus, type 2) ([place])   . Hyperlipidemia   . Hypertension   . Hypothyroidism   . Panic attacks    mild  . SVT (supraventricular tachycardia) ([facility])    in the past  . Wears glasses    Past Surgical History:  Procedure Laterality Date  . ABDOMINAL HYSTERECTOMY     BSO as well  . APPENDECTOMY    . APPLICATION OF A-CELL OF EXTREMITY Left 01/15/2015   Procedure: APPLICATION OF A-CELL OF EXTREMITY;  Surgeon: Theodoro Kos, DO;  Location: [place];  Service: Plastics;  Laterality: Left;  . CHOLECYSTECTOMY    . I&D EXTREMITY Left 01/15/2015   Procedure: IRRIGATION AND DEBRIDEMENT EXTREMITY;  Surgeon: Theodoro Kos, DO;  Location: [place];  Service:  Plastics;  Laterality: Left;  . IR KYPHO EA ADDL LEVEL THORACIC OR LUMBAR  05/10/2017  . IR KYPHO THORACIC WITH BONE BIOPSY  05/10/2017  . IR RADIOLOGIST EVAL & MGMT  04/28/2017  . MASTECTOMY  1985   Left  . RECONSTRUCTION BREAST W/ LATISSIMUS DORSI FLAP    . TOTAL SHOULDER ARTHROPLASTY  05/27/2012   Procedure: TOTAL SHOULDER ARTHROPLASTY;  Surgeon: Johnny Bridge, MD;  Location: [place];  Service: Orthopedics;  Laterality: Right;    reports that she has never smoked. She has never used smokeless tobacco. She reports that she does not drink alcohol or use drugs. family history includes Coronary artery disease in an other family member; Diabetes in an other family member; Heart disease in her mother; Hypertension in her father, mother, sister, and another family member; Stroke in her father. Allergies  Allergen Reactions  . Penicillins Anaphylaxis and Hives    Has patient had a PCN reaction causing immediate rash, facial/tongue/throat swelling, SOB or lightheadedness with hypotension: Yes Has patient had a PCN reaction causing severe rash involving mucus membranes or skin necrosis: Yes Has patient had a PCN reaction that required hospitalization: No Has patient had a PCN reaction occurring within the last 10 years: Yes If all of the above answers are "NO", then may proceed with Cephalosporin use.   Marland Kitchen Keflex [Cephalexin]  Rash      Review of Systems  Constitutional: Negative for chills and fever.  Respiratory: Negative for shortness of breath and wheezing.   Cardiovascular: Positive for leg swelling. Negative for chest pain and palpitations.  Gastrointestinal: Negative for abdominal pain.  Genitourinary: Negative for dysuria.       Objective:   Physical Exam Vitals signs reviewed.  Constitutional:      Appearance: Normal appearance.  Cardiovascular:     Rate and Rhythm: Normal rate and regular rhythm.  Pulmonary:     Effort: Pulmonary effort is normal.     Breath sounds: Normal  breath sounds.  Musculoskeletal:     Comments: Patient has fairly extensive edema left lower extremity and none on the right.  She does have some very mild erythema involving much of the left leg with slight warmth to touch but not convinced this is cellulitis versus related to her edema.  No skin breakdown  Neurological:     Mental Status: She is alert.        Assessment:     #1 left leg edema.  Relatively acute onset a week ago.  Question of cellulitis.  We need to rule out acute DVT  #2 type 2 diabetes poorly controlled with recent A1c 11.9%.  Patient already taking glimepiride and Metformin  #3 hypertension stable  #4 hyperlipidemia  #5 hypothyroidism    Plan:     -Set up venous Doppler left lower extremity to rule out acute DVT -Follow-up immediately for any chest pains or increasing shortness of breath -Check further labs today with lipid panel, hepatic panel, basic metabolic panel, TSH -Elevate leg frequently -We will get labs first and then determine next best course for diabetes.  If renal function stable we will look at options of SGLT2 or GLP-1 medication. -We will schedule I month follow-up in May change interval depending on results above  [name] MD [facility] at [facility]

## 2019-11-26 ENCOUNTER — Telehealth: Payer: Self-pay | Admitting: Family Medicine

## 2019-11-26 ENCOUNTER — Ambulatory Visit (HOSPITAL_COMMUNITY)
Admission: RE | Admit: 2019-11-26 | Discharge: 2019-11-26 | Disposition: A | Discharge status: Home / Self Care | Payer: Medicare HMO | Source: Ambulatory Visit | Attending: Cardiovascular Disease | Admitting: Cardiovascular Disease

## 2019-11-26 ENCOUNTER — Other Ambulatory Visit: Payer: Self-pay

## 2019-11-26 DIAGNOSIS — R600 Localized edema: Secondary | ICD-10-CM | POA: Diagnosis not present

## 2019-11-26 MED ORDER — APIXABAN 5 MG PO TABS: ORAL_TABLET | ORAL | 2 refills | Status: DC

## 2019-11-26 NOTE — Telephone Encounter (Signed)
Received phone call that patient has extensive DVT left lower extremity.  She has very little pain.  No dyspnea.  No chest pain.  I discussed with patient and we recommended starting Eliquis 5 mg 2 tablets twice daily for 1 week then decrease to 1 tablet twice daily -We will set up a 30-minute follow-up on Friday to reassess.  We also recommend she double up her Amaryl to 4 mg daily and will discuss labs further at follow-up

## 2019-11-26 NOTE — Telephone Encounter (Signed)
Copied from [place] [id number]. Topic: General - Other >> Nov 26, 2019  2:02 PM Keene Breath wrote: Reason for CRM: Called to give Stat report.  Tried the office 2x but no answer.  Please call back as soon as possible.  CB# [phone]

## 2019-11-26 NOTE — Telephone Encounter (Signed)
This has already been addressed. Dr. Elease Hashimoto spoke with Andee Poles from [facility].

## 2019-11-26 NOTE — Telephone Encounter (Signed)
Called patient and LMOVM to return call  [place] for [facility] to Discuss results / PCP / recommendations / Schedule patient  Per Dr. Elease Hashimoto: Please set up Mrs Lorah for 30 minute follow up in office this Friday?   CRM Created.

## 2019-11-26 NOTE — Telephone Encounter (Signed)
Clinic RN called. No answer. LVM for a return call on back line

## 2019-11-28 ENCOUNTER — Other Ambulatory Visit: Payer: Self-pay

## 2019-11-29 ENCOUNTER — Ambulatory Visit (INDEPENDENT_AMBULATORY_CARE_PROVIDER_SITE_OTHER): Payer: Medicare HMO | Admitting: Family Medicine

## 2019-11-29 ENCOUNTER — Other Ambulatory Visit: Payer: Self-pay

## 2019-11-29 ENCOUNTER — Encounter: Payer: Self-pay | Admitting: Family Medicine

## 2019-11-29 VITALS — BP 126/74 | HR 97 | Temp 97.7°F | Ht 62.5 in | Wt 224.4 lb

## 2019-11-29 DIAGNOSIS — E1165 Type 2 diabetes mellitus with hyperglycemia: Secondary | ICD-10-CM

## 2019-11-29 DIAGNOSIS — I82402 Acute embolism and thrombosis of unspecified deep veins of left lower extremity: Secondary | ICD-10-CM | POA: Insufficient documentation

## 2019-11-29 DIAGNOSIS — I824Z2 Acute embolism and thrombosis of unspecified deep veins of left distal lower extremity: Secondary | ICD-10-CM

## 2019-11-29 LAB — GLUCOSE, POCT (MANUAL RESULT ENTRY): POC Glucose: 234 mg/dl — AB (ref 70–99)

## 2019-11-29 MED ORDER — ONETOUCH VERIO VI STRP: ORAL_STRIP | 3 refills | Status: DC

## 2019-11-29 MED ORDER — ONETOUCH VERIO FLEX SYSTEM W/DEVICE KIT: PACK | 1 refills | Status: DC

## 2019-11-29 MED ORDER — BD PEN NEEDLE NANO U/F 32G X 4 MM MISC: 1 refills | Status: AC

## 2019-11-29 NOTE — Progress Notes (Signed)
Subjective:     Patient ID: Michaela Rhodes, female   DOB: May 25, 1936, 83 y.o.   MRN: [id number]  HPI Michaela Rhodes is seen for follow-up regarding DVT left lower extremity.  She was seen with progressive left lower extremity swelling.  She had been treated previously and recently for possible cellulitis without much improvement.  She was started on Eliquis and is currently 10 mg twice daily for the first week with instructions to drop back to 5 mg twice daily after 1 week.  She thinks the swelling is already slightly down.  No leg pain.  No dyspnea.  No chest pain.  No prior history of DVT.  She thinks she had a sister with DVT.  Patient thinks her major risk factor is she has been very sedentary since the pandemic basically staying most of her time in 1 room.  Prior to that, she had been swimming a lot.  Non-smoker.  No hormonal therapy.  She does have remote history of breast cancer.  Other issue is she had recent A1c 11.9%.  She takes Metformin and glimepiride 2 mg daily.  She had previous gallstone pancreatitis.  She had recent GFR 40 which makes use of SGLT2 medication not ideal.  She is trying to step up compliance with diet  Past Medical History:  Diagnosis Date  . Cancer [facility])    breast cancer  . DM2 (diabetes mellitus, type 2) ([place])   . Hyperlipidemia   . Hypertension   . Hypothyroidism   . Panic attacks    mild  . SVT (supraventricular tachycardia) ([facility])    in the past  . Wears glasses    Past Surgical History:  Procedure Laterality Date  . ABDOMINAL HYSTERECTOMY     BSO as well  . APPENDECTOMY    . APPLICATION OF A-CELL OF EXTREMITY Left 01/15/2015   Procedure: APPLICATION OF A-CELL OF EXTREMITY;  Surgeon: Theodoro Kos, DO;  Location: [place];  Service: Plastics;  Laterality: Left;  . CHOLECYSTECTOMY    . I&D EXTREMITY Left 01/15/2015   Procedure: IRRIGATION AND DEBRIDEMENT EXTREMITY;  Surgeon: Theodoro Kos, DO;  Location: [place];   Service: Plastics;  Laterality: Left;  . IR KYPHO EA ADDL LEVEL THORACIC OR LUMBAR  05/10/2017  . IR KYPHO THORACIC WITH BONE BIOPSY  05/10/2017  . IR RADIOLOGIST EVAL & MGMT  04/28/2017  . MASTECTOMY  1985   Left  . RECONSTRUCTION BREAST W/ LATISSIMUS DORSI FLAP    . TOTAL SHOULDER ARTHROPLASTY  05/27/2012   Procedure: TOTAL SHOULDER ARTHROPLASTY;  Surgeon: Johnny Bridge, MD;  Location: [place];  Service: Orthopedics;  Laterality: Right;    reports that she has never smoked. She has never used smokeless tobacco. She reports that she does not drink alcohol or use drugs. family history includes Coronary artery disease in an other family member; Diabetes in an other family member; Heart disease in her mother; Hypertension in her father, mother, sister, and another family member; Stroke in her father. Allergies  Allergen Reactions  . Penicillins Anaphylaxis and Hives    Has patient had a PCN reaction causing immediate rash, facial/tongue/throat swelling, SOB or lightheadedness with hypotension: Yes Has patient had a PCN reaction causing severe rash involving mucus membranes or skin necrosis: Yes Has patient had a PCN reaction that required hospitalization: No Has patient had a PCN reaction occurring within the last 10 years: Yes If all of the above answers are "NO", then may proceed with Cephalosporin  use.   . Keflex [Cephalexin]     Rash      Review of Systems  Constitutional: Negative for chills and fever.  Respiratory: Negative for cough and shortness of breath.   Cardiovascular: Positive for leg swelling. Negative for chest pain and palpitations.  Gastrointestinal: Negative for abdominal pain and blood in stool.  Genitourinary: Negative for hematuria.  Hematological: Does not bruise/bleed easily.       Objective:   Physical Exam Vitals reviewed.  Constitutional:      Appearance: Normal appearance.  Cardiovascular:     Rate and Rhythm: Normal rate and regular rhythm.  Pulmonary:      Effort: Pulmonary effort is normal.     Breath sounds: Normal breath sounds.  Musculoskeletal:     Comments: She has asymmetric edema left lower extremity greater than right as previously noted.  Unchanged from last visit  Neurological:     Mental Status: She is alert.        Assessment:     #1 recently diagnosed acute DVT left lower extremity.  This the first time DVT event.  Risk factors include age, sedentary lifestyle, diabetes, possible family history.  She is doing well symptomatically with very little pain at this time  #2 type 2 diabetes poorly controlled with recent A1c over 11.  She already takes Metformin and glimepiride.  She is not a good candidate for GLP-1 with prior pancreatitis.  Not a good candidate for SGLT2 class because of her GFR 40    Plan:     -Continue Eliquis 10 mg twice daily for the first week and then drop back to 5 mg twice daily and minimum of 3 months of therapy -We will bring back to reassess before she completes therapy to discuss whether to possibly have her see hematologist to help get their opinion regarding duration of therapy. -Start Basaglar insulin 10 units once daily and patient instructed in use by nurse.  She will get a new glucose monitor.  Titrate Basaglar 2 units every 3 days until fasting sugars consistently around 130 or less  Michaela Post MD [facility] at [facility]

## 2019-11-29 NOTE — Patient Instructions (Addendum)
Start the Basaglar 10 units subcutaneous once at night  Check fasting blood sugars daily and increase the Basaglar 2 units every 3 days until fasting sugars consistently < 130.     Deep Vein Thrombosis  Deep vein thrombosis (DVT) is a condition in which a blood clot forms in a deep vein, such as a lower leg, thigh, or arm vein. A clot is blood that has thickened into a gel or solid. This condition is dangerous. It can lead to serious and even life-threatening complications if the clot travels to the lungs and causes a blockage (pulmonary embolism). It can also damage veins in the leg. This can result in leg pain, swelling, discoloration, and sores (post-thrombotic syndrome). What are the causes? This condition may be caused by:  A slowdown of blood flow.  Damage to a vein.  A condition that causes blood to clot more easily, such as an inherited clotting disorder. What increases the risk? The following factors may make you more likely to develop this condition:  Being overweight.  Being older, especially over age 73.  Sitting or lying down for more than four hours.  Being in the hospital.  Lack of physical activity (sedentary lifestyle).  Pregnancy, being in childbirth, or having recently given birth.  Taking medicines that contain estrogen, such as medicines to prevent pregnancy.  Smoking.  A history of any of the following: ? Blood clots or a blood clotting disease. ? Peripheral vascular disease. ? Inflammatory bowel disease. ? Cancer. ? Heart disease. ? Genetic conditions that affect how your blood clots, such as Factor V Leiden mutation. ? Neurological diseases that affect your legs (leg paresis). ? A recent injury, such as a car accident. ? Major or lengthy surgery. ? A central line placed inside a large vein. What are the signs or symptoms? Symptoms of this condition include:  Swelling, pain, or tenderness in an arm or leg.  Warmth, redness, or discoloration in  an arm or leg. If the clot is in your leg, symptoms may be more noticeable or worse when you stand or walk. Some people may not develop any symptoms. How is this diagnosed? This condition is diagnosed with:  A medical history and physical exam.  Tests, such as: ? Blood tests. These are done to check how well your blood clots. ? Ultrasound. This is done to check for clots. ? Venogram. For this test, contrast dye is injected into a vein and X-rays are taken to check for any clots. How is this treated? Treatment for this condition depends on:  The cause of your DVT.  Your risk for bleeding or developing more clots.  Any other medical conditions that you have. Treatment may include:  Taking a blood thinner (anticoagulant). This type of medicine prevents clots from forming. It may be taken by mouth, injected under the skin, or injected through an IV (catheter).  Injecting clot-dissolving medicines into the affected vein (catheter-directed thrombolysis).  Having surgery. Surgery may be done to: ? Remove the clot. ? Place a filter in a large vein to catch blood clots before they reach the lungs. Some treatments may be continued for up to six months. Follow these instructions at home: If you are taking blood thinners:  Take the medicine exactly as told by your health care provider. Some blood thinners need to be taken at the same time every day. Do not skip a dose.  Talk with your health care provider before you take any medicines that contain aspirin or  NSAIDs. These medicines increase your risk for dangerous bleeding.  Ask your health care provider about foods and drugs that could change the way the medicine works (may interact). Avoid those things if your health care provider tells you to do so.  Blood thinners can cause easy bruising and may make it difficult to stop bleeding. Because of this: ? Be very careful when using knives, scissors, or other sharp objects. ? Use an electric  razor instead of a blade. ? Avoid activities that could cause injury or bruising, and follow instructions about how to prevent falls.  Wear a medical alert bracelet or carry a card that lists what medicines you take. General instructions  Take over-the-counter and prescription medicines only as told by your health care provider.  Return to your normal activities as told by your health care provider. Ask your health care provider what activities are safe for you.  Wear compression stockings if recommended by your health care provider.  Keep all follow-up visits as told by your health care provider. This is important. How is this prevented? To lower your risk of developing this condition again:  For 30 or more minutes every day, do an activity that: ? Involves moving your arms and legs. ? Increases your heart rate.  When traveling for longer than four hours: ? Exercise your arms and legs every hour. ? Drink plenty of water. ? Avoid drinking alcohol.  Avoid sitting or lying for a long time without moving your legs.  If you have surgery or you are hospitalized, ask about ways to prevent blood clots. These may include taking frequent walks or using anticoagulants.  Stay at a healthy weight.  If you are a woman who is older than age 38, avoid unnecessary use of medicines that contain estrogen, such as some birth control pills.  Do not use any products that contain nicotine or tobacco, such as cigarettes and e-cigarettes. This is especially important if you take estrogen medicines. If you need help quitting, ask your health care provider. Contact a health care provider if:  You miss a dose of your blood thinner.  Your menstrual period is heavier than usual.  You have unusual bruising. Get help right away if:  You have: ? New or increased pain, swelling, or redness in an arm or leg. ? Numbness or tingling in an arm or leg. ? Shortness of breath. ? Chest pain. ? A rapid or  irregular heartbeat. ? A severe headache or confusion. ? A cut that will not stop bleeding.  There is blood in your vomit, stool, or urine.  You have a serious fall or accident, or you hit your head.  You feel light-headed or dizzy.  You cough up blood. These symptoms may represent a serious problem that is an emergency. Do not wait to see if the symptoms will go away. Get medical help right away. Call your local emergency services (911 in the [place] Do not drive yourself to the hospital. Summary  Deep vein thrombosis (DVT) is a condition in which a blood clot forms in a deep vein, such as a lower leg, thigh, or arm vein.  Symptoms can include swelling, warmth, pain, and redness in your leg or arm.  This condition may be treated with a blood thinner (anticoagulant medicine), medicine that is injected to dissolve blood clots,compression stockings, or surgery.  If you are prescribed blood thinners, take them exactly as told. This information is not intended to replace advice given to you by  your health care provider. Make sure you discuss any questions you have with your health care provider. Document Released: 12/05/2005 Document Revised: 11/17/2017 Document Reviewed: 05/05/2017 Elsevier Patient Education  2020 [employer].

## 2019-12-02 ENCOUNTER — Telehealth: Payer: Self-pay | Admitting: Family Medicine

## 2019-12-02 ENCOUNTER — Other Ambulatory Visit: Payer: Self-pay

## 2019-12-02 MED ORDER — ACCU-CHEK GUIDE VI STRP: ORAL_STRIP | 3 refills | Status: AC

## 2019-12-02 MED ORDER — ACCU-CHEK GUIDE W/DEVICE KIT: 1.0000 | PACK | Freq: Every day | 1 refills | Status: AC

## 2019-12-02 NOTE — Telephone Encounter (Signed)
Message routed to PCP CMA  

## 2019-12-02 NOTE — Telephone Encounter (Signed)
I have sent the Accu Chek Guide glucose test meter and strips to the [facility] requested.

## 2019-12-02 NOTE — Telephone Encounter (Signed)
Patient is calling regarding Blood Glucose Monitoring Suppl (Michaela Rhodes) w/[employer] [[id number]]   Pharmacy is stating the the insurance [employer] is requesting the patient to use Accucheck or True Metric Please advise  [phone]  Preferred Pharmacy-[facility].

## 2019-12-19 ENCOUNTER — Other Ambulatory Visit: Payer: Self-pay | Admitting: Family Medicine

## 2019-12-27 ENCOUNTER — Other Ambulatory Visit: Payer: Self-pay

## 2019-12-27 ENCOUNTER — Encounter: Payer: Self-pay | Admitting: Family Medicine

## 2019-12-27 ENCOUNTER — Ambulatory Visit (INDEPENDENT_AMBULATORY_CARE_PROVIDER_SITE_OTHER): Payer: Medicare HMO | Admitting: Family Medicine

## 2019-12-27 VITALS — BP 126/84 | HR 78 | Temp 98.3°F | Ht 62.5 in | Wt 223.3 lb

## 2019-12-27 DIAGNOSIS — E1165 Type 2 diabetes mellitus with hyperglycemia: Secondary | ICD-10-CM

## 2019-12-27 DIAGNOSIS — I824Z2 Acute embolism and thrombosis of unspecified deep veins of left distal lower extremity: Secondary | ICD-10-CM | POA: Diagnosis not present

## 2019-12-27 DIAGNOSIS — I10 Essential (primary) hypertension: Secondary | ICD-10-CM

## 2019-12-27 MED ORDER — BASAGLAR KWIKPEN 100 UNIT/ML ~~LOC~~ SOPN: 12.0000 [IU] | PEN_INJECTOR | Freq: Every day | SUBCUTANEOUS | 3 refills | Status: DC

## 2019-12-27 NOTE — Progress Notes (Signed)
Subjective:     Patient ID: Michaela Rhodes, female   DOB: [birth year], 84 y.o.   MRN: [id number]  HPI Michaela Rhodes is seen for medical follow-up.  Recent acute DVT left lower extremity diagnosed 1 month ago.  This is her first event.  She thinks she had a sister with DVT.  This was basically unprovoked.  She had been very sedentary she thinks that may be her major risk factor in addition to age and obesity status.  She is on Eliquis 5 mg twice daily.  She has no leg pain at this time.  Still has some edema though improving gradually.  She has type 2 diabetes with recent very poor control.  She is trying to step up her dietary compliance since then.  We added Basaglar insulin and she is currently on 12 units daily.  She brings in some fasting readings in the started off hot but she has had several readings down even around 100 and some below that over the past couple of weeks.  She plans to start walking more soon.  She is somewhat limited by some chronic back and bilateral hip pain  She has hypertension treated with amlodipine and Zestoretic.  Denies any recent dizziness.  No chest pains.  Past Medical History:  Diagnosis Date  . Cancer [facility])    breast cancer  . DM2 (diabetes mellitus, type 2) ([place])   . Hyperlipidemia   . Hypertension   . Hypothyroidism   . Panic attacks    mild  . SVT (supraventricular tachycardia) (HCC)    in the past  . Wears glasses    Past Surgical History:  Procedure Laterality Date  . ABDOMINAL HYSTERECTOMY     BSO as well  . APPENDECTOMY    . APPLICATION OF A-CELL OF EXTREMITY Left 01/15/2015   Procedure: APPLICATION OF A-CELL OF EXTREMITY;  Surgeon: Theodoro Kos, DO;  Location: [place];  Service: Plastics;  Laterality: Left;  . CHOLECYSTECTOMY    . I & D EXTREMITY Left 01/15/2015   Procedure: IRRIGATION AND DEBRIDEMENT EXTREMITY;  Surgeon: Theodoro Kos, DO;  Location: [place];  Service: Plastics;  Laterality: Left;  . IR  KYPHO EA ADDL LEVEL THORACIC OR LUMBAR  05/10/2017  . IR KYPHO THORACIC WITH BONE BIOPSY  05/10/2017  . IR RADIOLOGIST EVAL & MGMT  04/28/2017  . MASTECTOMY  1985   Left  . RECONSTRUCTION BREAST W/ LATISSIMUS DORSI FLAP    . TOTAL SHOULDER ARTHROPLASTY  05/27/2012   Procedure: TOTAL SHOULDER ARTHROPLASTY;  Surgeon: Johnny Bridge, MD;  Location: [place];  Service: Orthopedics;  Laterality: Right;    reports that she has never smoked. She has never used smokeless tobacco. She reports that she does not drink alcohol or use drugs. family history includes Coronary artery disease in an other family member; Diabetes in an other family member; Heart disease in her mother; Hypertension in her father, mother, sister, and another family member; Stroke in her father. Allergies  Allergen Reactions  . Penicillins Anaphylaxis and Hives    Has patient had a PCN reaction causing immediate rash, facial/tongue/throat swelling, SOB or lightheadedness with hypotension: Yes Has patient had a PCN reaction causing severe rash involving mucus membranes or skin necrosis: Yes Has patient had a PCN reaction that required hospitalization: No Has patient had a PCN reaction occurring within the last 10 years: Yes If all of the above answers are "NO", then may proceed with Cephalosporin use.   Marland Kitchen  Keflex [Cephalexin]     Rash      Review of Systems  Constitutional: Negative for chills, fatigue and fever.  Eyes: Negative for visual disturbance.  Respiratory: Negative for cough, chest tightness, shortness of breath and wheezing.   Cardiovascular: Positive for leg swelling. Negative for chest pain and palpitations.  Endocrine: Negative for polydipsia and polyuria.  Genitourinary: Negative for dysuria.  Neurological: Negative for dizziness, seizures, syncope, weakness, light-headedness and headaches.       Objective:   Physical Exam Constitutional:      Appearance: She is well-developed.  Eyes:     Pupils: Pupils are  equal, round, and reactive to light.  Neck:     Thyroid: No thyromegaly.     Vascular: No JVD.  Cardiovascular:     Rate and Rhythm: Normal rate and regular rhythm.     Heart sounds: No gallop.   Pulmonary:     Effort: Pulmonary effort is normal. No respiratory distress.     Breath sounds: Normal breath sounds. No wheezing or rales.  Musculoskeletal:     Cervical back: Neck supple.     Comments: She has edema involving left lower extremity from her DVT but stable  Neurological:     Mental Status: She is alert.        Assessment:     #1 type 2 diabetes poorly controlled with recent A1c 11.9%.  She has seen some significant improvement since starting low-dose Basaglar.  No significant hypoglycemia  #2 hypertension stable and at goal  #3 recent acute DVT left lower extremity-not treated with Eliquis    Plan:     -Recommend 4-month follow-up.  That will be near the end of her 3 months of treatment with Eliquis.  We will discuss at that point whether to seek hematology consultation versus stopping her Eliquis  -Refill Basaglar.  Continue to monitor fastings.  Consider slow titration up 2 units every 3 days until fastings consistently less than 130  -We have challenged her to try to increase her activity levels and try to lose some weight between now and follow-up.  We will plan A1c at follow-up  [name] MD [facility] at [facility]

## 2020-02-11 ENCOUNTER — Other Ambulatory Visit: Payer: Self-pay | Admitting: Family Medicine

## 2020-02-11 DIAGNOSIS — M5136 Other intervertebral disc degeneration, lumbar region: Secondary | ICD-10-CM | POA: Diagnosis not present

## 2020-02-11 DIAGNOSIS — M5416 Radiculopathy, lumbar region: Secondary | ICD-10-CM | POA: Diagnosis not present

## 2020-02-12 DIAGNOSIS — M5416 Radiculopathy, lumbar region: Secondary | ICD-10-CM | POA: Insufficient documentation

## 2020-02-24 ENCOUNTER — Encounter: Payer: Self-pay | Admitting: Family Medicine

## 2020-02-24 ENCOUNTER — Other Ambulatory Visit: Payer: Self-pay

## 2020-02-24 ENCOUNTER — Ambulatory Visit (INDEPENDENT_AMBULATORY_CARE_PROVIDER_SITE_OTHER): Payer: Medicare HMO | Admitting: Family Medicine

## 2020-02-24 VITALS — BP 124/80 | HR 91 | Temp 97.6°F | Ht 62.5 in | Wt 224.8 lb

## 2020-02-24 DIAGNOSIS — I824Z2 Acute embolism and thrombosis of unspecified deep veins of left distal lower extremity: Secondary | ICD-10-CM | POA: Diagnosis not present

## 2020-02-24 DIAGNOSIS — E1169 Type 2 diabetes mellitus with other specified complication: Secondary | ICD-10-CM | POA: Diagnosis not present

## 2020-02-24 DIAGNOSIS — I10 Essential (primary) hypertension: Secondary | ICD-10-CM

## 2020-02-24 DIAGNOSIS — E118 Type 2 diabetes mellitus with unspecified complications: Secondary | ICD-10-CM

## 2020-02-24 DIAGNOSIS — M5416 Radiculopathy, lumbar region: Secondary | ICD-10-CM | POA: Diagnosis not present

## 2020-02-24 DIAGNOSIS — Z794 Long term (current) use of insulin: Secondary | ICD-10-CM | POA: Diagnosis not present

## 2020-02-24 LAB — POCT GLYCOSYLATED HEMOGLOBIN (HGB A1C): Hemoglobin A1C: 7.7 % — AB (ref 4.0–5.6)

## 2020-02-24 NOTE — Progress Notes (Signed)
Subjective:     Patient ID: Michaela Rhodes, female   DOB: 18-Aug-1936, 84 y.o.   MRN: [id number]  HPI Mrs Mehring is here for medical follow-up.  She has type 2 diabetes with recent elevation of A1c 11.9%.  She was already taking Metformin and low-dose glimepiride.  We added insulin and she has not gone above dosage of 12 units of Basaglar.  Recent fasting blood sugars have ranged between 80 and 148.  Her A1c is greatly improved today at 7.7%.  No significant polyuria or polydipsia.  Recent acute DVT left lower extremity.  She still has some edema but no pain.  She is on Eliquis 5 mg twice daily.  She states her sister has had not 1 but 2 DVTs previously.  She is not aware of any definite hereditary coagulopathy from lab work.  She denies any chest pain or pleuritic pain.  This is her first episode of DVT and she has been very sedentary which is her main risk factor other than age.  Hypertension which is treated with lisinopril HCTZ and amlodipine.  Her blood pressures been stable.  No recent dizziness.  She has gotten both Covid vaccines  Past Medical History:  Diagnosis Date  . Cancer [facility])    breast cancer  . DM2 (diabetes mellitus, type 2) ([place])   . Hyperlipidemia   . Hypertension   . Hypothyroidism   . Panic attacks    mild  . SVT (supraventricular tachycardia) (HCC)    in the past  . Wears glasses    Past Surgical History:  Procedure Laterality Date  . ABDOMINAL HYSTERECTOMY     BSO as well  . APPENDECTOMY    . APPLICATION OF A-CELL OF EXTREMITY Left 01/15/2015   Procedure: APPLICATION OF A-CELL OF EXTREMITY;  Surgeon: Theodoro Kos, DO;  Location: [place];  Service: Plastics;  Laterality: Left;  . CHOLECYSTECTOMY    . I & D EXTREMITY Left 01/15/2015   Procedure: IRRIGATION AND DEBRIDEMENT EXTREMITY;  Surgeon: Theodoro Kos, DO;  Location: [place];  Service: Plastics;  Laterality: Left;  . IR KYPHO EA ADDL LEVEL THORACIC OR LUMBAR   05/10/2017  . IR KYPHO THORACIC WITH BONE BIOPSY  05/10/2017  . IR RADIOLOGIST EVAL & MGMT  04/28/2017  . MASTECTOMY  1985   Left  . RECONSTRUCTION BREAST W/ LATISSIMUS DORSI FLAP    . TOTAL SHOULDER ARTHROPLASTY  05/27/2012   Procedure: TOTAL SHOULDER ARTHROPLASTY;  Surgeon: Johnny Bridge, MD;  Location: [place];  Service: Orthopedics;  Laterality: Right;    reports that she has never smoked. She has never used smokeless tobacco. She reports that she does not drink alcohol or use drugs. family history includes Coronary artery disease in an other family member; Diabetes in an other family member; Heart disease in her mother; Hypertension in her father, mother, sister, and another family member; Stroke in her father. Allergies  Allergen Reactions  . Penicillins Anaphylaxis and Hives    Has patient had a PCN reaction causing immediate rash, facial/tongue/throat swelling, SOB or lightheadedness with hypotension: Yes Has patient had a PCN reaction causing severe rash involving mucus membranes or skin necrosis: Yes Has patient had a PCN reaction that required hospitalization: No Has patient had a PCN reaction occurring within the last 10 years: Yes If all of the above answers are "NO", then may proceed with Cephalosporin use.   Marland Kitchen Keflex [Cephalexin]     Rash  Review of Systems  Constitutional: Negative for chills, fatigue, fever and unexpected weight change.  Eyes: Negative for visual disturbance.  Respiratory: Negative for cough, chest tightness, shortness of breath and wheezing.   Cardiovascular: Positive for leg swelling. Negative for chest pain and palpitations.  Endocrine: Negative for polydipsia and polyuria.  Neurological: Negative for dizziness, seizures, syncope, weakness, light-headedness and headaches.       Objective:   Physical Exam Constitutional:      Appearance: She is well-developed.  Eyes:     Pupils: Pupils are equal, round, and reactive to light.  Neck:      Thyroid: No thyromegaly.     Vascular: No JVD.  Cardiovascular:     Rate and Rhythm: Normal rate and regular rhythm.     Heart sounds: No gallop.   Pulmonary:     Effort: Pulmonary effort is normal. No respiratory distress.     Breath sounds: Normal breath sounds. No wheezing or rales.  Musculoskeletal:     Cervical back: Neck supple.     Comments: She has some edema left lower extremity related to her recent DVT.  No calf tenderness.  Neurological:     Mental Status: She is alert.        Assessment:     #1 type 2 diabetes greatly improved with reduction A1c 11.9 to 7.7%  #2 hypertension stable and at goal  #3 acute DVT left lower extremity.  This was a first-time event.  Technically this was unprovoked even though she is very sedentary.  Positive family history as above    Plan:     -Continue Eliquis 5 mg twice daily for now. -We will check factor V Leiden, Cardiolipin antibodies, prothrombin gene mutation, lupus anticoagulant panel, protein S total, protein S activity, protein C total, protein C activity, beta-2 glycoprotein antibodies, and Antithrombin III -After we get above testing back will help make decision regarding chronic anticoagulation with lower dose such as 2.5 mg Eliquis twice daily -We have challenged her to try to lose some weight and increase her walking and I think we can get the A1c down below 7 without further medication.  We will plan 81-month follow-up to reassess  [name] MD [facility]

## 2020-02-24 NOTE — Patient Instructions (Signed)
Your A1C is GREATLY improved to 7.7%  I think we can see this < 7 with some modest weight loss and increased activity such as walking  Continue with the Eliquis at 5 mg twice daily until you hear back about labs.   We may be decreasing this to 2.5 mg twice daily soon

## 2020-02-25 LAB — LUPUS ANTICOAGULANT
Dilute Viper Venom Time: 37.8 s (ref 0.0–47.0)
PTT Lupus Anticoagulant: 30.3 s (ref 0.0–51.9)
Thrombin Time: 17.4 s (ref 0.0–23.0)
dPT Confirm Ratio: 1.06 Ratio (ref 0.00–1.40)
dPT: 31.5 s (ref 0.0–55.0)

## 2020-02-26 ENCOUNTER — Other Ambulatory Visit: Payer: Self-pay | Admitting: Family Medicine

## 2020-02-26 NOTE — Telephone Encounter (Signed)
Please advise if OK to fill or if you need to change dosage from lab results per your OV message.

## 2020-02-27 NOTE — Telephone Encounter (Signed)
Refill for 3 months    we are still waiting to get some labs back to help clarify duration of therapy.

## 2020-02-28 ENCOUNTER — Telehealth: Payer: Self-pay | Admitting: Family Medicine

## 2020-02-28 NOTE — Telephone Encounter (Signed)
Please see message. °

## 2020-02-28 NOTE — Telephone Encounter (Signed)
Called patient and gave her the message from Dr. Elease Hashimoto. Patient stated that the pharmacy said her refill was denied but I see that it was sent electronically yesterday. Patient will check again and verbalized an understanding.

## 2020-02-28 NOTE — Telephone Encounter (Signed)
We are still waiting to get a few of the tests back and would like to get all the test back before determining about Eliquis.  In the meantime stay on Eliquis at current dosage

## 2020-02-28 NOTE — Telephone Encounter (Signed)
Pt was in the office on 02/24/2020 and had labs done. Pt states, that blood clots in Mychart says, it's positive. Also, does pt need to continue taking Eliquis? Pt is requesting to speak to you. Thanks

## 2020-03-02 DIAGNOSIS — M5416 Radiculopathy, lumbar region: Secondary | ICD-10-CM | POA: Diagnosis not present

## 2020-03-05 LAB — BETA-2 GLYCOPROTEIN ANTIBODIES
Beta-2 Glyco 1 IgA: <9 SAU (ref <=20)
Beta-2 Glyco 1 IgM: <9 SMU (ref <=20)
Beta-2 Glyco I IgG: <9 SGU (ref <=20)

## 2020-03-05 LAB — FACTOR 5 LEIDEN

## 2020-03-05 LAB — CARDIOLIPIN ANTIBODIES, IGG, IGM, IGA
Anticardiolipin IgA: <11 [APL'U] (ref <=11)
Anticardiolipin IgG: <14 [GPL'U] (ref <=14)
Anticardiolipin IgM: <12 [MPL'U] (ref <=12)

## 2020-03-05 LAB — PROTEIN S ACTIVITY: Protein S Activity: 118 % (ref 60–140)

## 2020-03-05 LAB — ANTITHROMBIN III: AntiThromb III Func: 108 % normal (ref 80–135)

## 2020-03-05 LAB — PROTHROMBIN GENE MUTATION

## 2020-03-05 LAB — PROTEIN S, TOTAL: PROTEIN S ANTIGEN, TOTAL: 134 % normal (ref 70–140)

## 2020-03-05 LAB — PROTEIN C ACTIVITY: Protein C Activity: 132 % (ref 70–180)

## 2020-03-05 LAB — PROTEIN C, TOTAL: PROTEIN C, ANTIGEN: 106 % (ref 70–140)

## 2020-03-09 DIAGNOSIS — M5416 Radiculopathy, lumbar region: Secondary | ICD-10-CM | POA: Diagnosis not present

## 2020-03-10 DIAGNOSIS — M5416 Radiculopathy, lumbar region: Secondary | ICD-10-CM | POA: Diagnosis not present

## 2020-03-12 DIAGNOSIS — M5416 Radiculopathy, lumbar region: Secondary | ICD-10-CM | POA: Diagnosis not present

## 2020-03-17 ENCOUNTER — Other Ambulatory Visit: Payer: Self-pay | Admitting: Family Medicine

## 2020-03-17 DIAGNOSIS — M5416 Radiculopathy, lumbar region: Secondary | ICD-10-CM | POA: Diagnosis not present

## 2020-03-19 DIAGNOSIS — M5416 Radiculopathy, lumbar region: Secondary | ICD-10-CM | POA: Diagnosis not present
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
## 2020-03-30 ENCOUNTER — Other Ambulatory Visit: Payer: Self-pay | Admitting: Family Medicine

## 2020-05-01 ENCOUNTER — Telehealth: Payer: Self-pay | Admitting: Family Medicine

## 2020-05-01 NOTE — Chronic Care Management (AMB) (Signed)
°  Chronic Care Management   Outreach Note  05/01/2020 Name: MADELEN DARIN MRN: [id number] DOB: Jun 06, 1936  Referred by: [name], MD Reason for referral : Chronic Care Management   An unsuccessful telephone outreach was attempted today. The patient was referred to the pharmacist for assistance with care management and care coordination.   Follow Up Plan:   [place]

## 2020-05-11 ENCOUNTER — Other Ambulatory Visit: Payer: Self-pay | Admitting: Family Medicine

## 2020-05-13 ENCOUNTER — Telehealth: Payer: Self-pay | Admitting: Family Medicine

## 2020-05-13 DIAGNOSIS — E669 Obesity, unspecified: Secondary | ICD-10-CM

## 2020-05-13 DIAGNOSIS — E039 Hypothyroidism, unspecified: Secondary | ICD-10-CM

## 2020-05-13 DIAGNOSIS — C50912 Malignant neoplasm of unspecified site of left female breast: Secondary | ICD-10-CM

## 2020-05-13 DIAGNOSIS — I10 Essential (primary) hypertension: Secondary | ICD-10-CM

## 2020-05-13 DIAGNOSIS — E785 Hyperlipidemia, unspecified: Secondary | ICD-10-CM

## 2020-05-13 DIAGNOSIS — M8580 Other specified disorders of bone density and structure, unspecified site: Secondary | ICD-10-CM

## 2020-05-13 DIAGNOSIS — E1165 Type 2 diabetes mellitus with hyperglycemia: Secondary | ICD-10-CM

## 2020-05-13 NOTE — Chronic Care Management (AMB) (Signed)
  Chronic Care Management   Note  05/13/2020 Name: Michaela Rhodes MRN: [id number] DOB: 12-27-1935  Michaela Rhodes is a 84 y.o. year old female who is a primary care patient of Burchette, Alinda Sierras, MD. I reached out to Carla Drape by phone today in response to a referral sent by [name] PCP, [name], MD.   Ms. States was given information about Chronic Care Management services today including:  1. CCM service includes personalized support from designated clinical staff supervised by her physician, including individualized plan of care and coordination with other care providers 2. 24/7 contact phone numbers for assistance for urgent and routine care needs. 3. Service will only be billed when office clinical staff spend 20 minutes or more in a month to coordinate care. 4. Only one practitioner may furnish and bill the service in a calendar month. 5. The patient may stop CCM services at any time (effective at the end of the month) by phone call to the office staff.   Patient agreed to services and verbal consent obtained.   Follow up plan:   [place]

## 2020-05-21 ENCOUNTER — Telehealth: Payer: Self-pay | Admitting: Family Medicine

## 2020-05-21 ENCOUNTER — Other Ambulatory Visit: Payer: Self-pay | Admitting: *Deleted

## 2020-05-21 DIAGNOSIS — E118 Type 2 diabetes mellitus with unspecified complications: Secondary | ICD-10-CM

## 2020-05-21 MED ORDER — BASAGLAR KWIKPEN 100 UNIT/ML ~~LOC~~ SOPN: 12.0000 [IU] | PEN_INJECTOR | Freq: Every day | SUBCUTANEOUS | 3 refills | Status: DC

## 2020-05-21 NOTE — Telephone Encounter (Signed)
Rx refilled and sent to pharmacy.

## 2020-05-21 NOTE — Telephone Encounter (Signed)
Pt is calling in stating that she is out and need refill on [facility]    Pharm:  [facility] on [address].

## 2020-05-26 ENCOUNTER — Other Ambulatory Visit: Payer: Self-pay

## 2020-05-26 ENCOUNTER — Encounter: Payer: Self-pay | Admitting: Family Medicine

## 2020-05-26 ENCOUNTER — Ambulatory Visit (INDEPENDENT_AMBULATORY_CARE_PROVIDER_SITE_OTHER): Payer: Medicare HMO | Admitting: Family Medicine

## 2020-05-26 VITALS — BP 126/82 | HR 115 | Temp 97.9°F | Wt 227.8 lb

## 2020-05-26 DIAGNOSIS — I824Z2 Acute embolism and thrombosis of unspecified deep veins of left distal lower extremity: Secondary | ICD-10-CM | POA: Diagnosis not present

## 2020-05-26 DIAGNOSIS — E1165 Type 2 diabetes mellitus with hyperglycemia: Secondary | ICD-10-CM

## 2020-05-26 LAB — POCT GLYCOSYLATED HEMOGLOBIN (HGB A1C): Hemoglobin A1C: 8.1 % — AB (ref 4.0–5.6)

## 2020-05-26 MED ORDER — APIXABAN 2.5 MG PO TABS: 2.5000 mg | ORAL_TABLET | Freq: Two times a day (BID) | ORAL | 1 refills | Status: DC

## 2020-05-26 NOTE — Progress Notes (Signed)
Established Patient Office Visit  Subjective:  Patient ID: Michaela Rhodes, female    DOB: [birth year]  Age: 84 y.o. MRN: [id number]  CC:  Chief Complaint  Patient presents with  . Follow-up    52monthfollow up pt states she has had trouble getting her insulin due to insurance wants to wait to see what her level is before calling insrance to see what they cover    HPI RADANNA ZUCKERMANpresents for medical follow-up.  She has type 2 diabetes.  Currently on Basaglar 14 units daily in addition to glimepiride and Metformin.  No hypoglycemia.  She has had some issues with insurance coverage of Basaglar.  She is not sure if they are able to cover other long-acting insulins.  She does not monitor her blood sugars regularly.  Last A1c had improved to 7.7% from 11.9%.  She has been out of her Basaglar for about 1 week  History of DVT left lower extremity back in December.  Positive family history of DVT and sister.  Patient had coagulation studies.  Positive prothrombin gene mutation with increased risk of venous thromboembolism 2-4 fold.  Currently on Eliquis 5 mg twice daily.  We discussed possible lowering the prevention dose.  No leg pain at this time.  Still very sedentary.  Does not exercise much at all  Past Medical History:  Diagnosis Date  . Cancer ([facility]    breast cancer  . DM2 (diabetes mellitus, type 2) ([place]   . Hyperlipidemia   . Hypertension   . Hypothyroidism   . Panic attacks    mild  . SVT (supraventricular tachycardia) (HCC)    in the past  . Wears glasses     Past Surgical History:  Procedure Laterality Date  . ABDOMINAL HYSTERECTOMY     BSO as well  . APPENDECTOMY    . APPLICATION OF A-CELL OF EXTREMITY Left 01/15/2015   Procedure: APPLICATION OF A-CELL OF EXTREMITY;  Surgeon: CTheodoro Kos DO;  [place]  Service: Plastics;  Laterality: Left;  . CHOLECYSTECTOMY    . I & D EXTREMITY Left 01/15/2015   Procedure: IRRIGATION AND DEBRIDEMENT  EXTREMITY;  Surgeon: CTheodoro Kos DO;  [place]  Service: Plastics;  Laterality: Left;  . IR KYPHO EA ADDL LEVEL THORACIC OR LUMBAR  05/10/2017  . IR KYPHO THORACIC WITH BONE BIOPSY  05/10/2017  . IR RADIOLOGIST EVAL & MGMT  04/28/2017  . MASTECTOMY  1985   Left  . RECONSTRUCTION BREAST W/ LATISSIMUS DORSI FLAP    . TOTAL SHOULDER ARTHROPLASTY  05/27/2012   Procedure: TOTAL SHOULDER ARTHROPLASTY;  Surgeon: JJohnny Bridge MD;  [place]  Service: Orthopedics;  Laterality: Right;    Family History  Problem Relation Age of Onset  . Heart disease Mother   . Hypertension Mother   . Stroke Father   . Hypertension Father   . Coronary artery disease Other        family hx of  . Diabetes Other        family hx of  . Hypertension Other        family hx of  . Hypertension Sister     Social History   Socioeconomic History  . Marital status: Widowed    Spouse name: Not on file  . Number of children: Not on file  . Years of education: Not on file  . Highest education level: Not on file  Occupational History  .  Occupation: retired    Comment: had [employer] business  Tobacco Use  . Smoking status: Never Smoker  . Smokeless tobacco: Never Used  Substance and Sexual Activity  . Alcohol use: No  . Drug use: No  . Sexual activity: Never  Other Topics Concern  . Not on file  Social History Narrative  . Not on file   Social Determinants of Health   Financial Resource Strain:   . Difficulty of Paying Living Expenses:   Food Insecurity:   . Worried About Charity fundraiser in the Last Year:   . Arboriculturist in the Last Year:   Transportation Needs:   . Film/video editor (Medical):   Marland Kitchen Lack of Transportation (Non-Medical):   Physical Activity:   . Days of Exercise per Week:   . Minutes of Exercise per Session:   Stress:   . Feeling of Stress :   Social Connections:   . Frequency of Communication with Friends and Family:   . Frequency  of Social Gatherings with Friends and Family:   . Attends Religious Services:   . Active Member of Clubs or Organizations:   . Attends Archivist Meetings:   Marland Kitchen Marital Status:   Intimate Partner Violence:   . Fear of Current or Ex-Partner:   . Emotionally Abused:   Marland Kitchen Physically Abused:   . Sexually Abused:     Outpatient Medications Prior to Visit  Medication Sig Dispense Refill  . amLODipine (NORVASC) 5 MG tablet Take 1 tablet by mouth once daily 90 tablet 0  . aspirin EC 81 MG EC tablet Take 1 tablet (81 mg total) by mouth daily.    . Blood Glucose Monitoring Suppl (ACCU-CHEK GUIDE) w/Device KIT 1 Device by Other route daily. Use to test blood sugar once daily. Dx Code E11.65 1 kit 1  . calcium-vitamin D (OSCAL WITH D) 500-200 MG-UNIT per tablet Take 1 tablet by mouth daily. 100 tablet 2  . EUTHYROX 137 MCG tablet TAKE 1 TABLET BY MOUTH ONCE DAILY BEFORE BREAKFAST 90 tablet 0  . glimepiride (AMARYL) 2 MG tablet Take 1 tablet by mouth once daily 90 tablet 0  . glucose blood (ACCU-CHEK GUIDE) test strip Use as instructed to test blood sugar once daily. Dx Code E11.65 100 each 3  . HYDROcodone-acetaminophen (NORCO/VICODIN) 5-325 MG tablet Take 2 tablets by mouth every 6 (six) hours as needed for up to 12 doses. 10 tablet 0  . Insulin Glargine (BASAGLAR KWIKPEN) 100 UNIT/ML Inject 0.12 mLs (12 Units total) into the skin daily. (Patient taking differently: Inject 14 Units into the skin daily. ) 9 mL 3  . Insulin Pen Needle (BD PEN NEEDLE NANO U/F) 32G X 4 MM MISC Use to inject insulin once daily. Dx Code E11.65 100 each 1  . lisinopril-hydrochlorothiazide (ZESTORETIC) 20-25 MG tablet Take 1 tablet by mouth once daily 90 tablet 0  . metFORMIN (GLUCOPHAGE-XR) 750 MG 24 hr tablet TAKE 1 TABLET BY MOUTH TWICE DAILY AT 10AM AND 5PM 180 tablet 0  . metoprolol tartrate (LOPRESSOR) 50 MG tablet Take 1 tablet by mouth twice daily 180 tablet 1  . pravastatin (PRAVACHOL) 40 MG tablet Take 1  tablet by mouth once daily 90 tablet 0  . ELIQUIS 5 MG TABS tablet TAKE 2 TABLETS BY MOUTH TWICE DAILY FOR ONE WEEK, THEN 1 TABLET TWICE DAILY 60 tablet 2   No facility-administered medications prior to visit.    Allergies  Allergen Reactions  . Penicillins  Anaphylaxis and Hives    Has patient had a PCN reaction causing immediate rash, facial/tongue/throat swelling, SOB or lightheadedness with hypotension: Yes Has patient had a PCN reaction causing severe rash involving mucus membranes or skin necrosis: Yes Has patient had a PCN reaction that required hospitalization: No Has patient had a PCN reaction occurring within the last 10 years: Yes If all of the above answers are "NO", then may proceed with Cephalosporin use.   Marland Kitchen Keflex [Cephalexin]     Rash     ROS Review of Systems    Objective:    Physical Exam  BP 126/82 (BP Location: Left Arm, Patient Position: Sitting, Cuff Size: Normal)   Pulse (!) 115   Temp 97.9 F (36.6 C) (Temporal)   Wt 227 lb 12.8 oz (103.3 kg)   SpO2 96%   BMI 41.00 kg/m  Wt Readings from Last 3 Encounters:  05/26/20 227 lb 12.8 oz (103.3 kg)  02/24/20 224 lb 12.8 oz (102 kg)  12/27/19 223 lb 4.8 oz (101.3 kg)     Health Maintenance Due  Topic Date Due  . COVID-19 Vaccine (1) Never done  . PNA vac Low Risk Adult (2 of 2 - PCV13) 09/25/2013  . FOOT EXAM  12/23/2015  . TETANUS/TDAP  12/27/2019    There are no preventive care reminders to display for this patient.  Lab Results  Component Value Date   TSH 3.42 11/25/2019   Lab Results  Component Value Date   WBC 22.4 (H) 11/29/2018   HGB 13.4 11/29/2018   HCT 43.8 11/29/2018   MCV 88.0 11/29/2018   PLT 309 11/29/2018   Lab Results  Component Value Date   NA 139 11/25/2019   K 4.7 11/25/2019   CO2 26 11/25/2019   GLUCOSE 200 (H) 11/25/2019   BUN 35 (H) 11/25/2019   CREATININE 1.27 (H) 11/25/2019   BILITOT 0.5 11/25/2019   ALKPHOS 133 (H) 11/25/2019   AST 14 11/25/2019   ALT  14 11/25/2019   PROT 6.8 11/25/2019   ALBUMIN 4.2 11/25/2019   CALCIUM 9.8 11/25/2019   ANIONGAP 14 11/29/2018   GFR 40.34 (L) 11/25/2019   Lab Results  Component Value Date   CHOL 184 11/25/2019   Lab Results  Component Value Date   HDL 47.70 11/25/2019   Lab Results  Component Value Date   LDLCALC 118 (H) 04/05/2017   Lab Results  Component Value Date   TRIG 251.0 (H) 11/25/2019   Lab Results  Component Value Date   CHOLHDL 4 11/25/2019   Lab Results  Component Value Date   HGBA1C 8.1 (A) 05/26/2020      Assessment & Plan:   #1 type 2 diabetes.  Not optimally controlled with A1c today 8.1%.  Out of insulin for the past week.  -Check on insurance coverage to see if they cover Levemir, Lantus, Toujeo, or Tyler Aas -In the meantime, if she stays on Basaglar increased to 16 units daily and continue monitoring with goal fasting blood sugars less than 130  #2 history of DVT left lower extremity with positive family history and positive prothrombin gene mutation  -Reduce Eliquis to 2.5 mg twice daily   Follow-up: Return in about 3 months (around 08/26/2020).    Carolann Littler, MD

## 2020-05-26 NOTE — Patient Instructions (Signed)
Check to see if insurance will cover Levemir, Lantus, Toujeo, or Tyler Aas in place of the [facility].    Reduce the Eliquis to 2.5 mg twice daily.    Increase the insulin to 16 units daily

## 2020-05-27 ENCOUNTER — Telehealth: Payer: Self-pay | Admitting: Family Medicine

## 2020-05-27 MED ORDER — TOUJEO MAX SOLOSTAR 300 UNIT/ML ~~LOC~~ SOPN: 16.0000 [IU] | PEN_INJECTOR | Freq: Every day | SUBCUTANEOUS | 2 refills | Status: DC

## 2020-05-27 NOTE — Telephone Encounter (Signed)
Pt notified of toujeo and that this has been sent into the pharmacy

## 2020-05-27 NOTE — Telephone Encounter (Signed)
Take Basaglar off list.  Send in Toujeo 16 units subcutaneous once daily

## 2020-05-27 NOTE — Addendum Note (Signed)
Addended by: Modena Morrow R on: 05/27/2020 11:12 AM   Modules accepted: Orders

## 2020-05-27 NOTE — Telephone Encounter (Signed)
Pt is calling stating in that she contacted her insurance company and they approve any of the following levemir, lantus and toujeo so Dr. Elease Hashimoto can make the decision which one he would like for to use.  Pharm: [facility] on [address]    Pt would like to have a call to let her know which one has been called in and when it has been called in.

## 2020-05-27 NOTE — Telephone Encounter (Signed)
Please advise 

## 2020-06-22 ENCOUNTER — Other Ambulatory Visit: Payer: Self-pay | Admitting: Family Medicine

## 2020-06-23 NOTE — Addendum Note (Signed)
Addended by: Westley Hummer B on: 06/23/2020 01:26 PM   Modules accepted: Orders

## 2020-06-23 NOTE — Chronic Care Management (AMB) (Signed)
Chronic Care Management Pharmacy  Name: EGAN SAHLIN  MRN: [id number] DOB: 1936/07/21  Initial Questions: 1. Have you seen any other providers since your last visit? NA 2. Any changes in your medicines or health? No   Chief Complaint/ HPI  Michaela Rhodes,  84 y.o. , female presents for their Initial CCM visit with the clinical pharmacist via telephone due to COVID-19 Pandemic.  Patient reported her main concern was the number of medications for diabetes that she is taking and would like to discuss if she needs to be on all of them.   Patient reports she went without insulin for about a week and may be reason for elevated A1c.   Patient reports she has limited sugar and white bread intake. She states she has no control on food that she eats since her daughter takes care of that. Sometimes will eat casserole.  She reports being sedentary. And has tried therapy. Physical activity is limited by pain. Will take occasional ibuprofen for pain.  Patient reports she likes  going [facility] and pushing cart to help with balance since balance is a concern. She reports her son suggested to get walker.  PCP : [name], MD  Their chronic conditions include: HTN, DM, HLD,  DVT, hypothyroidism, osteopenia, pain   Office Visits: 05/26/2020- Carolann Littler, MD- Patient presented for office visit for follow up. A1c: 8.1%. Patient to check insurance coverage for Levemir, Lantus, Toujeo or [place]. Meantime, increase Basaglar to 16units daily. Eliquis dose decreased to 2.5 twice daily.   02/24/2020- Carolann Littler, MD- Patient presented for office visit for follow up. A1c: >11%. Patient to continue with weight loss and increasing walking. No changes in medications. Patient to continue Eliquis 45m BID and obtain lab work.   12/27/2019- BCarolann Littler MD- Patient presented for office visit for follow up. Patient started Eliquis 1 month ago for DVT. To be re-evaluated at 3 months. Patient to  increase physical activity to lose some weight. A1c at next follow up..   Medications: Outpatient Encounter Medications as of 06/24/2020  Medication Sig  . amLODipine (NORVASC) 5 MG tablet Take 1 tablet by mouth once daily  . apixaban (ELIQUIS) 2.5 MG TABS tablet Take 1 tablet (2.5 mg total) by mouth 2 (two) times daily.  . Blood Glucose Monitoring Suppl (ACCU-CHEK GUIDE) w/Device KIT 1 Device by Other route daily. Use to test blood sugar once daily. Dx Code E11.65  . calcium-vitamin D (OSCAL WITH D) 500-200 MG-UNIT per tablet Take 1 tablet by mouth daily.  .Arna Medici137 MCG tablet TAKE 1 TABLET BY MOUTH ONCE DAILY BEFORE BREAKFAST  . glimepiride (AMARYL) 2 MG tablet Take 1 tablet by mouth once daily  . glucose blood (ACCU-CHEK GUIDE) test strip Use as instructed to test blood sugar once daily. Dx Code E11.65  . insulin glargine, 2 Unit Dial, (TOUJEO MAX SOLOSTAR) 300 UNIT/ML Solostar Pen Inject 16 Units into the skin daily.  . Insulin Pen Needle (BD PEN NEEDLE NANO U/F) 32G X 4 MM MISC Use to inject insulin once daily. Dx Code E11.65  . lisinopril-hydrochlorothiazide (ZESTORETIC) 20-25 MG tablet Take 1 tablet by mouth once daily  . metFORMIN (GLUCOPHAGE-XR) 750 MG 24 hr tablet TAKE 1 TABLET BY MOUTH TWICE DAILY AT 10AM AND 5PM  . metoprolol tartrate (LOPRESSOR) 50 MG tablet Take 1 tablet by mouth twice daily  . pravastatin (PRAVACHOL) 40 MG tablet Take 1 tablet by mouth once daily  . aspirin EC 81 MG EC tablet  Take 1 tablet (81 mg total) by mouth daily. (Patient not taking: Reported on 06/24/2020)  . HYDROcodone-acetaminophen (NORCO/VICODIN) 5-325 MG tablet Take 2 tablets by mouth every 6 (six) hours as needed for up to 12 doses. (Patient not taking: Reported on 06/24/2020)  . [DISCONTINUED] glimepiride (AMARYL) 2 MG tablet Take 1 tablet by mouth once daily   No facility-administered encounter medications on file as of 06/24/2020.     Current Diagnosis/Assessment:  Goals Addressed              This Visit's Progress   . Pharmacy Care Plan       CARE PLAN ENTRY (see longitudinal plan of care for additional care plan information)  Current Barriers:  . Chronic Disease Management support, education, and care coordination needs related to Hypertension, Hyperlipidemia, Diabetes, Hypothyroidism, and Osteopenia, DVT   Hypertension BP Readings from Last 3 Encounters:  05/26/20 126/82  02/24/20 124/80  12/27/19 126/84   . Pharmacist Clinical Goal(s): o Over the next 90 days, patient will work with PharmD and providers to maintain BP goal <130/80 . Current regimen:   Amlodipine 32m, 1 tablet once daily  Lisinopril/HCTZ  20-293m 1 tablet once daily  Metoprolol tartrate 5073m1 tablet twice daily . Interventions: o We discussed differenct mechanism of action of HTN regimen and importance of adherence  . Patient self care activities - Over the next 90 days, patient will: o Check BP 1 to 2 times per month, document, and provide at future appointments o Ensure daily salt intake < 2300 mg/day  Hyperlipidemia Lab Results  Component Value Date/Time   LDLCALC 118 (H) 04/05/2017 09:19 AM   LDLDIRECT 91.0 11/25/2019 03:02 PM   . Pharmacist Clinical Goal(s): o Over the next 90 days, patient will work with PharmD and providers to maintain LDL goal < 100 . Current regimen:  o Pravastatin 4m54m tablet once daily  . Interventions: . We discussed how a diet high in plant sterols (fruits/vegetables/nuts/whole grains/legumes) may reduce your cholesterol.  Encouraged increasing fiber to a daily intake of 10-25g/day  . Patient self care activities - Over the next 90 days, patient will: o Continue lifestyle modifications (diet and physical activity)  Diabetes Lab Results  Component Value Date/Time   HGBA1C 8.1 (A) 05/26/2020 10:16 AM   HGBA1C 7.7 (A) 02/24/2020 09:42 AM   HGBA1C 9.3 (H) 03/18/2019 10:33 AM   HGBA1C 7.9 (H) 07/12/2017 03:46 AM   . Pharmacist Clinical  Goal(s): o Over the next 90 days, patient will work with PharmD and providers to achieve A1c goal <7% . Current regimen:   glimepiride 2mg 46mtablet once daily  Insulin glargine (Toujeo Max Solostar), inject 16 units into skin daily  Metformin ER 750mg,3mablet twice daily at 10AM and 5PM  . Interventions: o We discussed:  o diet and exercise extensively  o how to recognize and treat signs of hypoglycemia   Difference mechanism of action of DM regimen and importance of adherence.   . Patient self care activities - Over the next 90 days, patient will: o Check blood sugar once daily, document, and provide at future appointments o Contact provider with any episodes of hypoglycemia  Hypothyroidism . Pharmacist Clinical Goal(s) o Over the next 90 days, patient will work with PharmD and providers to maintain TSH: 0.35 to 4.5 uIU/mL . Current regimen:  o Euthyrox 137mcg,21mablet once daily before breakfast  . Interventions: o We discussed:  discussed consistent administration of levothyroxine at least 30 minutes  before first meal  . Patient self care activities - Over the next 90 days, patient will: o Continue current medications as instructed.   DVT (blood clot) . Pharmacist Clinical Goal(s) o Over the next 90 days, patient will work with PharmD and providers to decrease risk of blood clot reoccurrence  . Current regimen:  o apixaban 2.28m, 1 tablet twice daily  . Interventions: o We discussed: monitoring for signs and symptoms for bleeding (coughing up blood, prolonged nose bleeds, black, tarry stools). . Patient self care activities o Patient will continue current medications as instructed  Osteopenia . Pharmacist Clinical Goal(s) o Over the next 90 days, patient will work with PharmD and providers to maintain proper vitamin D and calcium intake to prevent bone fractures.  . Current regimen:  o Calcium- vitamin D 500-2073m 1 tablet once daily (at  night) . Interventions: o Recommend [id number] units of vitamin D daily.  o Recommend 1200 mg of calcium daily from dietary and supplemental sources.  o Recommend weight-bearing and muscle strengthening exercises for building and maintaining bone density . Patient self care activities o Patient will continue current supplement as instructed and incorporate weight-bearing exercises.   Medication management . Pharmacist Clinical Goal(s): o Over the next 90 days, patient will work with PharmD and providers to maintain optimal medication adherence . Current pharmacy: [facility] . Interventions o Comprehensive medication review performed. o Continue current medication management strategy . Patient self care activities - Over the next 90 days, patient will: o Take medications as prescribed o Report any questions or concerns to PharmD and/or provider(s)  Initial goal documentation       SDOH Interventions     Most Recent Value  SDOH Interventions  Financial Strain Interventions Intervention Not Indicated  Transportation Interventions Intervention Not Indicated       Diabetes   Recent Relevant Labs: Lab Results  Component Value Date/Time   HGBA1C 8.1 (A) 05/26/2020 10:16 AM   HGBA1C 7.7 (A) 02/24/2020 09:42 AM   HGBA1C 9.3 (H) 03/18/2019 10:33 AM   HGBA1C 7.9 (H) 07/12/2017 03:46 AM    Checking BG: Daily  Recent FBG Readings: 100- 120-130 (patient reports improvement since starting insulin)   Patient has failed these meds in past: Basaglar ([employer])   Patient is currently better controlled (per reported BG readings) on the following medications:   glimepiride 2m90m1 tablet once daily  Insulin glargine (Toujeo Max SolAmeren Corporationinject 16 units into skin daily  Metformin ER 750m70m tablet twice daily at 10AM and 5PM   Last diabetic Eye exam:  Lab Results  Component Value Date/Time   HMDIABEYEEXA No Retinopathy 06/06/2019 12:00 AM  -  Had cataract surgery a year  ago  - reports has eye check-in in October   Last diabetic Foot  exam:  Lab Results  Component Value Date/Time   HMDIABFOOTEX normal 12/22/2014 12:00 AM  -  Denies neuropathy/ numbness  We discussed: diet and exercise extensively and how to recognize and treat signs of hypoglycemia   Difference mechanism of action of DM regimen and importance of adherence.    Plan Continue current medications   Hypertension   Patient reported having HA prior to BP meds and now  HA is gone.   Denies dizziness/ lightheadedness/ orthostatic hypotension.   Office blood pressures are  BP Readings from Last 3 Encounters:  05/26/20 126/82  02/24/20 124/80  12/27/19 126/84   Patient has failed these meds in the past: none   Patient checks BP  at home several times per month  Patient home BP readings are ranging: NA  Patient is controlled on:   Amlodipine 23m, 1 tablet once daily  Lisinopril/HCTZ  20-273m 1 tablet once daily  Metoprolol tartrate 5067m1 tablet twice daily  We discussed differenct mechanism of action of HTN regimen and importance of adherence   Plan Continue current medications     Hyperlipidemia   LDL goal < 100  Lipid Panel     Component Value Date/Time   CHOL 184 11/25/2019 1502   TRIG 251.0 (H) 11/25/2019 1502   HDL 47.70 11/25/2019 1502   LDLCALC 118 (H) 04/05/2017 0919   LDLDIRECT 91.0 11/25/2019 1502    Hepatic Function Latest Ref Rng & Units 11/25/2019 12/17/2018 07/12/2017  Total Protein 6.0 - 8.3 g/dL 6.8 6.7 5.2(L)  Albumin 3.5 - 5.2 g/dL 4.2 4.2 2.3(L)  AST 0 - 37 U/L '14 12 29  ' ALT 0 - 35 U/L 14 12 42  Alk Phosphatase 39 - 117 U/L 133(H) 268(H) 74  Total Bilirubin 0.2 - 1.2 mg/dL 0.5 0.5 0.3  Bilirubin, Direct 0.0 - 0.3 mg/dL 0.1 0.1 -     The ASCVD Risk score (GoMikey Bussing Jr., et al., 2013) failed to calculate for the following reasons:   The 2013 ASCVD risk score is only valid for ages 40 61 79 28Patient has failed these meds in past: none    Patient is currently controlled on the following medications:  . Pravastatin 56m22m tablet once daily   We discussed:  diet and exercise extensively (see above)   Plan Continue current medications  DVT    Patient has failed these meds in past: none   Patient is currently controlled on the following medications:  . apixaban 2.5mg,48mtablet twice daily   We discussed: monitoring for signs and symptoms for bleeding (coughing up blood, prolonged nose bleeds, black, tarry stools).  Plan Continue current medications   Hypothyroidism   Lab Results  Component Value Date/Time   TSH 3.42 11/25/2019 03:02 PM   TSH 3.51 12/17/2018 03:05 PM   Patient is currently controlled on the following medications:  . Euthyrox 137mcg55mtablet once daily before breakfast   We discussed:  discussed consistent administration of levothyroxine at least 30 minutes before first meal   Plan Continue current medications  Primary prevention ASCVD   Patient reports she has not been taking recently and will start taking it if she needs to.  Denies MI/ stroke/ heart procedures, etc.  Patient is on the following medications:  . Aspirin 81mg, 31mblet once daily (currently not taking)  We discussed:  Increased risk of bleeding with aspirin and apixaban.   Plan Recommended not to restart aspirin  Osteopenia    Last DEXA Scan: 03/03/2016  T-Score femoral neck: RFN: -1.4; LFN: -1.2  T-Score lumbar spine: -0.6  10-year probability of major osteoporotic fracture: 11.5%  10-year probability of hip fracture: 2.4%  No results found for: VD25OH   Patient is not a candidate for pharmacologic treatment  Patient has failed these meds in past: none Patient is currently controlled on the following medications:  . Calcium- vitamin D 500-200mg, 159mlet once daily (at night)   We discussed:  Recommend [id number] units of vitamin D daily. Recommend 1200 mg of calcium daily from dietary and supplemental  sources. Recommend weight-bearing and muscle strengthening exercises for building and maintaining bone density.  Plan Continue current medications  Pain  Patient reported pain limits  physical activity.   Patient is currently uncontrolled on the following medications:  . No medications . Has considered ibuprofen as needed  We discussed:  The maximum daily dose of acetaminophen was discussed with the patient. She was encouraged not to exceed 3,000 mg of acetaminophen during a 24 hour period and was asked to keep in mind that acetaminophen can also be found in many over-the-counter cold medications as well as narcotics that may be given for pain.  Plan Recommend OTC acetaminophen for pain versus ibuprofen (safer option in regards to bleeding risk with apixaban).   Vaccines   Reviewed and discussed patient's vaccination history.    Immunization History  Administered Date(s) Administered  . Influenza Split 09/25/2012  . Influenza,inj,Quad PF,6+ Mos 08/30/2013, 12/22/2014  . Pneumococcal Polysaccharide-23 09/25/2012  . Tdap 12/26/2009   Plan Recommended patient receive tetanus, shingles vaccine in office/ pharmacy Recommended for patient to bring in [employer] vaccine card at next office visit to add dates to patient's chart.   Medication Management  Patient organizes medications: patient reports organizes medication in weekly strip  Primary pharmacy: [facility]  Adherence:  - metoprolol tartrate 64m (last filled 90DS on 02/26/20)-- patient reported having extra metoprolol due to finding previously lost supply.  - glimepiride 2 mg (last filled 90DS on 03/17/20) -- reports just getting this refilled   Follow up Follow up visit with PharmD in 3 months.    AAnson Crofts PharmD Clinical Pharmacist [facility]([phone]

## 2020-06-24 ENCOUNTER — Ambulatory Visit: Payer: Medicare HMO

## 2020-06-24 ENCOUNTER — Other Ambulatory Visit: Payer: Self-pay

## 2020-06-24 DIAGNOSIS — I824Z2 Acute embolism and thrombosis of unspecified deep veins of left distal lower extremity: Secondary | ICD-10-CM

## 2020-06-24 DIAGNOSIS — M8580 Other specified disorders of bone density and structure, unspecified site: Secondary | ICD-10-CM

## 2020-06-24 DIAGNOSIS — I10 Essential (primary) hypertension: Secondary | ICD-10-CM

## 2020-06-24 DIAGNOSIS — E1165 Type 2 diabetes mellitus with hyperglycemia: Secondary | ICD-10-CM

## 2020-06-24 DIAGNOSIS — E785 Hyperlipidemia, unspecified: Secondary | ICD-10-CM

## 2020-06-24 DIAGNOSIS — E039 Hypothyroidism, unspecified: Secondary | ICD-10-CM

## 2020-06-25 NOTE — Patient Instructions (Addendum)
Visit Information  Goals Addressed            This Visit's Progress   . Pharmacy Care Plan       CARE PLAN ENTRY (see longitudinal plan of care for additional care plan information)  Current Barriers:  . Chronic Disease Management support, education, and care coordination needs related to Hypertension, Hyperlipidemia, Diabetes, Hypothyroidism, and Osteopenia, DVT   Hypertension BP Readings from Last 3 Encounters:  05/26/20 126/82  02/24/20 124/80  12/27/19 126/84   . Pharmacist Clinical Goal(s): o Over the next 90 days, patient will work with PharmD and providers to maintain BP goal <130/80 . Current regimen:   Amlodipine 5mg , 1 tablet once daily  Lisinopril/HCTZ  20-25mg , 1 tablet once daily  Metoprolol tartrate 50mg , 1 tablet twice daily . Interventions: o We discussed differenct mechanism of action of HTN regimen and importance of adherence  . Patient self care activities - Over the next 90 days, patient will: o Check BP 1 to 2 times per month, document, and provide at future appointments o Ensure daily salt intake < 2300 mg/day  Hyperlipidemia Lab Results  Component Value Date/Time   LDLCALC 118 (H) 04/05/2017 09:19 AM   LDLDIRECT 91.0 11/25/2019 03:02 PM   . Pharmacist Clinical Goal(s): o Over the next 90 days, patient will work with PharmD and providers to maintain LDL goal < 100 . Current regimen:  o Pravastatin 40mg , 1 tablet once daily  . Interventions: . We discussed how a diet high in plant sterols (fruits/vegetables/nuts/whole grains/legumes) may reduce your cholesterol.  Encouraged increasing fiber to a daily intake of 10-25g/day  . Patient self care activities - Over the next 90 days, patient will: o Continue lifestyle modifications (diet and physical activity)  Diabetes Lab Results  Component Value Date/Time   HGBA1C 8.1 (A) 05/26/2020 10:16 AM   HGBA1C 7.7 (A) 02/24/2020 09:42 AM   HGBA1C 9.3 (H) 03/18/2019 10:33 AM   HGBA1C 7.9 (H) 07/12/2017  03:46 AM   . Pharmacist Clinical Goal(s): o Over the next 90 days, patient will work with PharmD and providers to achieve A1c goal <7% . Current regimen:   glimepiride 2mg  ,1 tablet once daily  Insulin glargine (Toujeo Max Solostar), inject 16 units into skin daily  Metformin ER 750mg , 1 tablet twice daily at 10AM and 5PM  . Interventions: o We discussed:  o diet and exercise extensively  o how to recognize and treat signs of hypoglycemia   Difference mechanism of action of DM regimen and importance of adherence.   . Patient self care activities - Over the next 90 days, patient will: o Check blood sugar once daily, document, and provide at future appointments o Contact provider with any episodes of hypoglycemia  Hypothyroidism . Pharmacist Clinical Goal(s) o Over the next 90 days, patient will work with PharmD and providers to maintain TSH: 0.35 to 4.5 uIU/mL . Current regimen:  o Euthyrox 15mcg, 1 tablet once daily before breakfast  . Interventions: o We discussed:  discussed consistent administration of levothyroxine at least 30 minutes before first meal  . Patient self care activities - Over the next 90 days, patient will: o Continue current medications as instructed.   DVT (blood clot) . Pharmacist Clinical Goal(s) o Over the next 90 days, patient will work with PharmD and providers to decrease risk of blood clot reoccurrence  . Current regimen:  o apixaban 2.5mg , 1 tablet twice daily  . Interventions: o We discussed: monitoring for signs and symptoms for  bleeding (coughing up blood, prolonged nose bleeds, black, tarry stools). . Patient self care activities o Patient will continue current medications as instructed  Osteopenia . Pharmacist Clinical Goal(s) o Over the next 90 days, patient will work with PharmD and providers to maintain proper vitamin D and calcium intake to prevent bone fractures.  . Current regimen:  o Calcium- vitamin D 500-200mg , 1 tablet once  daily (at night) . Interventions: o Recommend [id number] units of vitamin D daily.  o Recommend 1200 mg of calcium daily from dietary and supplemental sources.  o Recommend weight-bearing and muscle strengthening exercises for building and maintaining bone density . Patient self care activities o Patient will continue current supplement as instructed and incorporate weight-bearing exercises.   Medication management . Pharmacist Clinical Goal(s): o Over the next 90 days, patient will work with PharmD and providers to maintain optimal medication adherence . Current pharmacy: [facility] . Interventions o Comprehensive medication review performed. o Continue current medication management strategy . Patient self care activities - Over the next 90 days, patient will: o Take medications as prescribed o Report any questions or concerns to PharmD and/or provider(s)  Initial goal documentation        Ms. Cloyd was given information about Chronic Care Management services today including:  1. CCM service includes personalized support from designated clinical staff supervised by her physician, including individualized plan of care and coordination with other care providers 2. 24/7 contact phone numbers for assistance for urgent and routine care needs. 3. Standard insurance, coinsurance, copays and deductibles apply for chronic care management only during months in which we provide at least 20 minutes of these services. Most insurances cover these services at 100%, however patients may be responsible for any copay, coinsurance and/or deductible if applicable. This service may help you avoid the need for more expensive face-to-face services. 4. Only one practitioner may furnish and bill the service in a calendar month. 5. The patient may stop CCM services at any time (effective at the end of the month) by phone call to the office staff.  Patient agreed to services and verbal consent obtained.   The  patient verbalized understanding of instructions provided today and agreed to receive a mailed copy of patient instruction and/or educational materials. Telephone follow up appointment with pharmacy team member scheduled for:  09/29/2020  Anson Crofts, PharmD Clinical Pharmacist [facility] [place] [phone]   Hyperglycemia Hyperglycemia is when the sugar (glucose) level in your blood is too high. It may not cause symptoms. If you do have symptoms, they may include warning signs, such as:  Feeling more thirsty than normal.  Hunger.  Feeling tired.  Needing to pee (urinate) more than normal.  Blurry eyesight (vision). You may get other symptoms as it gets worse, such as:  Dry mouth.  Not being hungry (loss of appetite).  Fruity-smelling breath.  Weakness.  Weight gain or loss that is not planned. Weight loss may be fast.  A tingling or numb feeling in your hands or feet.  Headache.  Skin that does not bounce back quickly when it is lightly pinched and released (poor skin turgor).  Pain in your belly (abdomen).  Cuts or bruises that heal slowly. High blood sugar can happen to people who do or do not have diabetes. High blood sugar can happen slowly or quickly, and it can be an emergency. Follow these instructions at home: General instructions  Take over-the-counter and prescription medicines only as told by your  doctor.  Do not use products that contain nicotine or tobacco, such as cigarettes and e-cigarettes. If you need help quitting, ask your doctor.  Limit alcohol intake to no more than 1 drink per day for nonpregnant women and 2 drinks per day for men. One drink equals 12 oz of beer, 5 oz of wine, or 1 oz of hard liquor.  Manage stress. If you need help with this, ask your doctor.  Keep all follow-up visits as told by your doctor. This is important. Eating and drinking   Stay at a healthy weight.  Exercise regularly, as told  by your doctor.  Drink enough fluid, especially when you: ? Exercise. ? Get sick. ? Are in hot temperatures.  Eat healthy foods, such as: ? Low-fat (lean) proteins. ? Complex carbs (complex carbohydrates), such as whole wheat bread or brown rice. ? Fresh fruits and vegetables. ? Low-fat dairy products. ? Healthy fats.  Drink enough fluid to keep your pee (urine) clear or pale yellow. If you have diabetes:   Make sure you know the symptoms of hyperglycemia.  Follow your diabetes management plan, as told by your doctor. Make sure you: ? Take insulin and medicines as told. ? Follow your exercise plan. ? Follow your meal plan. Eat on time. Do not skip meals. ? Check your blood sugar as often as told. Make sure to check before and after exercise. If you exercise longer or in a different way than you normally do, check your blood sugar more often. ? Follow your sick day plan whenever you cannot eat or drink normally. Make this plan ahead of time with your doctor.  Share your diabetes management plan with people in your workplace, school, and household.  Check your urine for ketones when you are ill and as told by your doctor.  Carry a card or wear jewelry that says that you have diabetes. Contact a doctor if:  Your blood sugar level is higher than 240 mg/dL (13.3 mmol/L) for 2 days in a row.  You have problems keeping your blood sugar in your target range.  High blood sugar happens often for you. Get help right away if:  You have trouble breathing.  You have a change in how you think, feel, or act (mental status).  You feel sick to your stomach (nauseous), and that feeling does not go away.  You cannot stop throwing up (vomiting). These symptoms may be an emergency. Do not wait to see if the symptoms will go away. Get medical help right away. Call your local emergency services (911 in the [place] Do not drive yourself to the hospital. Summary  Hyperglycemia is when the  sugar (glucose) level in your blood is too high.  High blood sugar can happen to people who do or do not have diabetes.  Make sure you drink enough fluids, eat healthy foods, and exercise regularly.  Contact your doctor if you have problems keeping your blood sugar in your target range. This information is not intended to replace advice given to you by your health care provider. Make sure you discuss any questions you have with your health care provider. Document Revised: 08/22/2016 Document Reviewed: 08/22/2016 Elsevier Patient Education  [place].

## 2020-07-09 ENCOUNTER — Other Ambulatory Visit: Payer: Self-pay | Admitting: Family Medicine

## 2020-08-25 ENCOUNTER — Other Ambulatory Visit: Payer: Self-pay | Admitting: Family Medicine

## 2020-08-26 ENCOUNTER — Ambulatory Visit (INDEPENDENT_AMBULATORY_CARE_PROVIDER_SITE_OTHER): Payer: Medicare HMO | Admitting: Family Medicine

## 2020-08-26 ENCOUNTER — Encounter: Payer: Self-pay | Admitting: Family Medicine

## 2020-08-26 ENCOUNTER — Other Ambulatory Visit: Payer: Self-pay

## 2020-08-26 VITALS — BP 130/92 | HR 101 | Temp 97.9°F | Ht 62.5 in | Wt 224.0 lb

## 2020-08-26 DIAGNOSIS — I10 Essential (primary) hypertension: Secondary | ICD-10-CM

## 2020-08-26 DIAGNOSIS — Z23 Encounter for immunization: Secondary | ICD-10-CM | POA: Diagnosis not present

## 2020-08-26 DIAGNOSIS — Z6841 Body Mass Index (BMI) 40.0 and over, adult: Secondary | ICD-10-CM | POA: Diagnosis not present

## 2020-08-26 DIAGNOSIS — E1165 Type 2 diabetes mellitus with hyperglycemia: Secondary | ICD-10-CM | POA: Diagnosis not present

## 2020-08-26 DIAGNOSIS — E1122 Type 2 diabetes mellitus with diabetic chronic kidney disease: Secondary | ICD-10-CM | POA: Diagnosis not present

## 2020-08-26 LAB — POCT GLYCOSYLATED HEMOGLOBIN (HGB A1C): Hemoglobin A1C: 7.1 % — AB (ref 4.0–5.6)

## 2020-08-26 NOTE — Progress Notes (Signed)
Established Patient Office Visit  Subjective:  Patient ID: Michaela Rhodes, female    DOB: 1936/03/21  Age: 84 y.o. MRN: [id number]  CC:  Chief Complaint  Patient presents with  . Follow-up    109month    HPI Michaela Rhodes for medical follow-up.  She has type 2 diabetes and last A1c back in June was 8.1%.  Because of insurance changes we switched her insulin over to Toujeo.  She is currently on 20 units daily.  No recent hypoglycemic symptoms.  Fasting CBGs ranging 120-140.  She had cataract surgery last year.  She had her last eye exam in September 2020 and plans to set this up soon.  She has history of DVT and also positive prothrombin gene mutation.  She is on Eliquis prophylactic dose of 2.5 mg twice daily.  She states she is consistent with all her medications.  Her other medical problems include hypertension, hypothyroidism, hyperlipidemia.  Denies any recent chest pains or dyspnea.  She is very sedentary.  She has chronic kidney disease and most recent GFR was 40.  Still needs flu vaccine.  She had Covid vaccine back in January and February.  Past Medical History:  Diagnosis Date  . Cancer ([facility]    breast cancer  . DM2 (di[place] mellitus, type 2) ([facility]   . Hyperlipidemia   . Hypertension   . Hypothyroidism   . Panic attacks    mild  . SVT (supraventricular tachycardia) ([facility])    in the past  . Wears glasses     Past Surgical History:  Procedure Laterality Date  . ABDOMINAL HYSTERECTOMY     BSO as well  . APPENDECTOMY    . APPLICATION OF A-CELL OF EXTREMITY Left 01/15/2015   Procedure: APPLICATION OF A-CELL OF EXTREMITY;  Surgeon: CTheodoro Kos DO;  [facility]  Service: Plastics;  Laterality: Left;  . CHOLECYSTECTOMY    . I & D EXTREMITY Left 01/15/2015   Procedure: IRRIGATION AND DEBRIDEMENT EXTREMITY;  Surgeon: CTheodoro Kos DO;  [facility]  Service: Plastics;  Laterality: Left;  . IR KYPHO EA ADDL  LEVEL THORACIC OR LUMBAR  05/10/2017  . IR KYPHO THORACIC WITH BONE BIOPSY  05/10/2017  . IR RADIOLOGIST EVAL & MGMT  04/28/2017  . MASTECTOMY  1985   Left  . RECONSTRUCTION BREAST W/ LATISSIMUS DORSI FLAP    . TOTAL SHOULDER ARTHROPLASTY  05/27/2012   Procedure: TOTAL SHOULDER ARTHROPLASTY;  Surgeon: JJohnny Bridge MD;  [facility]  Service: Orthopedics;  Laterality: Right;    Family History  Problem Relation Age of Onset  . Heart disease Mother   . Hypertension Mother   . Stroke Father   . Hypertension Father   . Coronary artery disease Other        family hx of  . Diabetes Other        family hx of  . Hypertension Other        family hx of  . Hypertension Sister     Social History   Socioeconomic History  . Marital status: Widowed    Spouse name: Not on file  . Number of children: Not on file  . Years of education: Not on file  . Highest education level: Not on file  Occupational History  . Occupation: retired    Comment: had [employer] business  Tobacco Use  . Smoking status: Never Smoker  . Smokeless tobacco: Never Used  Vaping  Use  . Vaping Use: Never used  Substance and Sexual Activity  . Alcohol use: No  . Drug use: No  . Sexual activity: Never  Other Topics Concern  . Not on file  Social History Narrative  . Not on file   Social Determinants of Health   Financial Resource Strain: Low Risk   . Difficulty of Paying Living Expenses: Not hard at all  Food Insecurity:   . Worried About Charity fundraiser in the Last Year: Not on file  . Ran Out of Food in the Last Year: Not on file  Transportation Needs: No Transportation Needs  . Lack of Transportation (Medical): No  . Lack of Transportation (Non-Medical): No  Physical Activity:   . Days of Exercise per Week: Not on file  . Minutes of Exercise per Session: Not on file  Stress:   . Feeling of Stress : Not on file  Social Connections:   . Frequency of Communication with Friends and Family: Not on  file  . Frequency of Social Gatherings with Friends and Family: Not on file  . Attends Religious Services: Not on file  . Active Member of Clubs or Organizations: Not on file  . Attends Archivist Meetings: Not on file  . Marital Status: Not on file  Intimate Partner Violence:   . Fear of Current or Ex-Partner: Not on file  . Emotionally Abused: Not on file  . Physically Abused: Not on file  . Sexually Abused: Not on file    Outpatient Medications Prior to Visit  Medication Sig Dispense Refill  . amLODipine (NORVASC) 5 MG tablet Take 1 tablet by mouth once daily 90 tablet 0  . apixaban (ELIQUIS) 2.5 MG TABS tablet Take 1 tablet (2.5 mg total) by mouth 2 (two) times daily. 180 tablet 1  . aspirin EC 81 MG EC tablet Take 1 tablet (81 mg total) by mouth daily.    . Blood Glucose Monitoring Suppl (ACCU-CHEK GUIDE) w/Device KIT 1 Device by Other route daily. Use to test blood sugar once daily. Dx Code E11.65 1 kit 1  . calcium-vitamin D (OSCAL WITH D) 500-200 MG-UNIT per tablet Take 1 tablet by mouth daily. 100 tablet 2  . EUTHYROX 137 MCG tablet TAKE 1 TABLET BY MOUTH ONCE DAILY BEFORE BREAKFAST 90 tablet 0  . glimepiride (AMARYL) 2 MG tablet Take 1 tablet by mouth once daily 90 tablet 1  . glucose blood (ACCU-CHEK GUIDE) test strip Use as instructed to test blood sugar once daily. Dx Code E11.65 100 each 3  . insulin glargine, 2 Unit Dial, (TOUJEO MAX SOLOSTAR) 300 UNIT/ML Solostar Pen Inject 16 Units into the skin daily. (Patient taking differently: Inject 20 Units into the skin daily. ) 3 pen 2  . Insulin Pen Needle (BD PEN NEEDLE NANO U/F) 32G X 4 MM MISC Use to inject insulin once daily. Dx Code E11.65 100 each 1  . lisinopril-hydrochlorothiazide (ZESTORETIC) 20-25 MG tablet Take 1 tablet by mouth once daily 90 tablet 0  . metFORMIN (GLUCOPHAGE-XR) 750 MG 24 hr tablet TAKE 1 TABLET BY MOUTH TWICE DAILY AT 10 AM AND 5 PM 180 tablet 0  . metoprolol tartrate (LOPRESSOR) 50 MG  tablet Take 1 tablet by mouth twice daily 180 tablet 1  . pravastatin (PRAVACHOL) 40 MG tablet Take 1 tablet by mouth once daily 90 tablet 0  . HYDROcodone-acetaminophen (NORCO/VICODIN) 5-325 MG tablet Take 2 tablets by mouth every 6 (six) hours as needed for up to  12 doses. 10 tablet 0   No facility-administered medications prior to visit.    Allergies  Allergen Reactions  . Penicillins Anaphylaxis and Hives    Has patient had a PCN reaction causing immediate rash, facial/tongue/throat swelling, SOB or lightheadedness with hypotension: Yes Has patient had a PCN reaction causing severe rash involving mucus membranes or skin necrosis: Yes Has patient had a PCN reaction that required hospitalization: No Has patient had a PCN reaction occurring within the last 10 years: Yes If all of the above answers are "NO", then may proceed with Cephalosporin use.   Marland Kitchen Keflex [Cephalexin]     Rash     ROS Review of Systems  Constitutional: Negative for fatigue and unexpected weight change.  Eyes: Negative for visual disturbance.  Respiratory: Negative for cough, chest tightness, shortness of breath and wheezing.   Cardiovascular: Negative for chest pain, palpitations and leg swelling.  Endocrine: Negative for polydipsia and polyuria.  Genitourinary: Negative for dysuria.  Neurological: Negative for dizziness, seizures, syncope, weakness, light-headedness and headaches.      Objective:    Physical Exam Vitals reviewed.  Constitutional:      Appearance: Normal appearance.  Cardiovascular:     Rate and Rhythm: Normal rate and regular rhythm.  Pulmonary:     Effort: Pulmonary effort is normal.     Breath sounds: Normal breath sounds.  Musculoskeletal:     Right lower leg: No edema.     Left lower leg: No edema.  Neurological:     Mental Status: She is alert.     BP (!) 130/92   Pulse (!) 101   Temp 97.9 F (36.6 C) (Oral)   Ht 5' 2.5" (1.588 m)   Wt 224 lb (101.6 kg)   SpO2 98%    BMI 40.32 kg/m  Wt Readings from Last 3 Encounters:  08/26/20 224 lb (101.6 kg)  05/26/20 227 lb 12.8 oz (103.3 kg)  02/24/20 224 lb 12.8 oz (102 kg)     Health Maintenance Due  Topic Date Due  . PNA vac Low Risk Adult (2 of 2 - PCV13) 09/25/2013  . TETANUS/TDAP  12/27/2019  . INFLUENZA VACCINE  07/19/2020    There are no preventive care reminders to display for this patient.  Lab Results  Component Value Date   TSH 3.42 11/25/2019   Lab Results  Component Value Date   WBC 22.4 (H) 11/29/2018   HGB 13.4 11/29/2018   HCT 43.8 11/29/2018   MCV 88.0 11/29/2018   PLT 309 11/29/2018   Lab Results  Component Value Date   NA 139 11/25/2019   K 4.7 11/25/2019   CO2 26 11/25/2019   GLUCOSE 200 (H) 11/25/2019   BUN 35 (H) 11/25/2019   CREATININE 1.27 (H) 11/25/2019   BILITOT 0.5 11/25/2019   ALKPHOS 133 (H) 11/25/2019   AST 14 11/25/2019   ALT 14 11/25/2019   PROT 6.8 11/25/2019   ALBUMIN 4.2 11/25/2019   CALCIUM 9.8 11/25/2019   ANIONGAP 14 11/29/2018   GFR 40.34 (L) 11/25/2019   Lab Results  Component Value Date   CHOL 184 11/25/2019   Lab Results  Component Value Date   HDL 47.70 11/25/2019   Lab Results  Component Value Date   LDLCALC 118 (H) 04/05/2017   Lab Results  Component Value Date   TRIG 251.0 (H) 11/25/2019   Lab Results  Component Value Date   CHOLHDL 4 11/25/2019   Lab Results  Component Value Date   HGBA1C  8.1 (A) 05/26/2020      Assessment & Plan:   #1 type 2 diabetes improved with A1c 7.1%. -Continue current insulin regimen.  #2 hypertension.  Her diastolic was slightly elevated today. -We recommend she try to lose some weight.  Follow-up in 3 months and if still elevated that time consider additional medications versus titration of medication she is on.  #3 hypothyroidism -Needs follow-up labs and will get a 35-monthfollow-up  #4 hyperlipidemia treated with pravastatin -Recheck lipid and hepatic panel at follow-up  #5  history of DVT.  Patient on low-dose Eliquis prophylaxis with history of prothrombin gene mutation  No orders of the defined types were placed in this encounter.   Follow-up: Return in about 3 months (around 11/25/2020).    BCarolann Littler MD

## 2020-08-26 NOTE — Addendum Note (Signed)
Addended by: Gwenyth Ober R on: 08/26/2020 08:50 AM   Modules accepted: Orders

## 2020-08-26 NOTE — Patient Instructions (Signed)
A1C is improved at 7.1!  Set up repeat eye exam  Lets's plan on 3 month follow up.

## 2020-09-09 ENCOUNTER — Other Ambulatory Visit: Payer: Self-pay | Admitting: Family Medicine

## 2020-09-11 DIAGNOSIS — S32018A Other fracture of first lumbar vertebra, initial encounter for closed fracture: Secondary | ICD-10-CM | POA: Diagnosis not present

## 2020-09-11 DIAGNOSIS — M5126 Other intervertebral disc displacement, lumbar region: Secondary | ICD-10-CM | POA: Diagnosis not present

## 2020-09-11 DIAGNOSIS — M5135 Other intervertebral disc degeneration, thoracolumbar region: Secondary | ICD-10-CM | POA: Diagnosis not present

## 2020-09-11 DIAGNOSIS — M48061 Spinal stenosis, lumbar region without neurogenic claudication: Secondary | ICD-10-CM | POA: Diagnosis not present

## 2020-09-11 DIAGNOSIS — M5137 Other intervertebral disc degeneration, lumbosacral region: Secondary | ICD-10-CM | POA: Diagnosis not present

## 2020-09-11 DIAGNOSIS — M5136 Other intervertebral disc degeneration, lumbar region: Secondary | ICD-10-CM | POA: Diagnosis not present

## 2020-09-11 DIAGNOSIS — M47816 Spondylosis without myelopathy or radiculopathy, lumbar region: Secondary | ICD-10-CM | POA: Diagnosis not present

## 2020-09-11 DIAGNOSIS — M47817 Spondylosis without myelopathy or radiculopathy, lumbosacral region: Secondary | ICD-10-CM | POA: Diagnosis not present

## 2020-09-11 DIAGNOSIS — Z9889 Other specified postprocedural states: Secondary | ICD-10-CM | POA: Diagnosis not present

## 2020-09-28 DIAGNOSIS — M5136 Other intervertebral disc degeneration, lumbar region: Secondary | ICD-10-CM | POA: Diagnosis not present

## 2020-09-28 DIAGNOSIS — F5109 Other insomnia not due to a substance or known physiological condition: Secondary | ICD-10-CM | POA: Diagnosis not present

## 2020-09-28 DIAGNOSIS — F064 Anxiety disorder due to known physiological condition: Secondary | ICD-10-CM | POA: Diagnosis not present

## 2020-09-28 DIAGNOSIS — R4582 Worries: Secondary | ICD-10-CM | POA: Diagnosis not present

## 2020-09-28 DIAGNOSIS — M47816 Spondylosis without myelopathy or radiculopathy, lumbar region: Secondary | ICD-10-CM | POA: Diagnosis not present

## 2020-09-28 DIAGNOSIS — G894 Chronic pain syndrome: Secondary | ICD-10-CM | POA: Diagnosis not present

## 2020-09-29 ENCOUNTER — Ambulatory Visit: Payer: Medicare HMO | Admitting: Pharmacist

## 2020-09-29 DIAGNOSIS — I10 Essential (primary) hypertension: Secondary | ICD-10-CM

## 2020-09-29 DIAGNOSIS — E1165 Type 2 diabetes mellitus with hyperglycemia: Secondary | ICD-10-CM

## 2020-09-29 NOTE — Chronic Care Management (AMB) (Signed)
Chronic Care Management Pharmacy  Name: YANETH FAIRBAIRN  MRN: [id number] DOB: 10-16-1936  Initial Questions: 1. Have you seen any other providers since your last visit? Yes  2. Any changes in your medicines or health? No   Chief Complaint/ HPI  Michaela Rhodes,  84 y.o. , female presents for their Follow-Up CCM visit with the clinical pharmacist via telephone due to COVID-19 Pandemic.  Patient reported the biggest change since last follow up was that she joined a back program in [place]. She said the MRI shows arthritis instead of anything detereorating and she has only been taking  Tylenol over the counter for pain.  PCP : [name], MD  Their chronic conditions include: HTN, DM, HLD,  DVT, hypothyroidism, osteopenia, pain   Office Visits: 08/26/20 Carolann Littler, MD: Patient presented for DM follow up. A1c improved from 8.1 to 7.1%. DBP was elevated today. Plans to follow up in 3 months to determine if still elevated. Will recheck lipid panel, LFTs, and TSH at follow up.  05/26/2020- Carolann Littler, MD- Patient presented for office visit for follow up. A1c: 8.1%. Patient to check insurance coverage for Levemir, Lantus, Toujeo or [place]. Meantime, increase Basaglar to 16units daily. Eliquis dose decreased to 2.5 twice daily.   02/24/2020- Carolann Littler, MD- Patient presented for office visit for follow up. A1c: >11%. Patient to continue with weight loss and increasing walking. No changes in medications. Patient to continue Eliquis 25m BID and obtain lab work.   Consult Visits: -09/28/20 JWylene Men MD (pain management): Patient presented for lower back pain and leg pain follow up. Unable to access notes.  Medications: Outpatient Encounter Medications as of 09/29/2020  Medication Sig  . amLODipine (NORVASC) 5 MG tablet Take 1 tablet by mouth once daily  . apixaban (ELIQUIS) 2.5 MG TABS tablet Take 1 tablet (2.5 mg total) by mouth 2 (two) times daily.  .Marland Kitchenaspirin EC  81 MG EC tablet Take 1 tablet (81 mg total) by mouth daily.  . Blood Glucose Monitoring Suppl (ACCU-CHEK GUIDE) w/Device KIT 1 Device by Other route daily. Use to test blood sugar once daily. Dx Code E11.65  . calcium-vitamin D (OSCAL WITH D) 500-200 MG-UNIT per tablet Take 1 tablet by mouth daily.  .Arna Medici137 MCG tablet TAKE 1 TABLET BY MOUTH ONCE DAILY BEFORE BREAKFAST  . glimepiride (AMARYL) 2 MG tablet Take 1 tablet by mouth once daily  . glucose blood (ACCU-CHEK GUIDE) test strip Use as instructed to test blood sugar once daily. Dx Code E11.65  . insulin glargine, 2 Unit Dial, (TOUJEO MAX SOLOSTAR) 300 UNIT/ML Solostar Pen Inject 16 Units into the skin daily. (Patient taking differently: Inject 20 Units into the skin daily. )  . Insulin Pen Needle (BD PEN NEEDLE NANO U/F) 32G X 4 MM MISC Use to inject insulin once daily. Dx Code E11.65  . lisinopril-hydrochlorothiazide (ZESTORETIC) 20-25 MG tablet Take 1 tablet by mouth once daily  . metFORMIN (GLUCOPHAGE-XR) 750 MG 24 hr tablet TAKE 1 TABLET BY MOUTH TWICE DAILY AT 10 AM AND 5 PM  . metoprolol tartrate (LOPRESSOR) 50 MG tablet Take 1 tablet by mouth twice daily  . pravastatin (PRAVACHOL) 40 MG tablet Take 1 tablet by mouth once daily   No facility-administered encounter medications on file as of 09/29/2020.     Current Diagnosis/Assessment:  Goals Addressed            This Visit's Progress   . Pharmacy Care Plan  CARE PLAN ENTRY (see longitudinal plan of care for additional care plan information)  Current Barriers:  . Chronic Disease Management support, education, and care coordination needs related to Hypertension, Hyperlipidemia, Diabetes, Hypothyroidism, and Osteopenia, DVT   Hypertension BP Readings from Last 3 Encounters:  08/26/20 (!) 130/92  05/26/20 126/82  02/24/20 124/80   . Pharmacist Clinical Goal(s): o Over the next 90 days, patient will work with PharmD and providers to maintain BP goal  <130/80 . Current regimen:   Amlodipine 59m, 1 tablet once daily  Lisinopril/HCTZ  20-226m 1 tablet once daily  Metoprolol tartrate 506m1 tablet twice daily . Interventions: o We discussed differenct mechanism of action of HTN regimen and importance of adherence  . Patient self care activities - Over the next 90 days, patient will: o Check blood pressure 2 to 3 times per month, document, and provide at future appointments o Ensure daily salt intake < 2300 mg/day  Hyperlipidemia Lab Results  Component Value Date/Time   LDLCALC 118 (H) 04/05/2017 09:19 AM   LDLDIRECT 91.0 11/25/2019 03:02 PM   . Pharmacist Clinical Goal(s): o Over the next 90 days, patient will work with PharmD and providers to achieve LDL goal < 70 . Current regimen:  o Pravastatin 69m7m tablet once daily  . Interventions: . We discussed how a diet high in plant sterols (fruits/vegetables/nuts/whole grains/legumes) may reduce your cholesterol.  Encouraged increasing fiber to a daily intake of 10-25g/day  . Patient self care activities - Over the next 90 days, patient will: o Continue lifestyle modifications (diet and physical activity)  Diabetes Lab Results  Component Value Date/Time   HGBA1C 7.1 (A) 08/26/2020 08:44 AM   HGBA1C 8.1 (A) 05/26/2020 10:16 AM   HGBA1C 9.3 (H) 03/18/2019 10:33 AM   HGBA1C 7.9 (H) 07/12/2017 03:46 AM   . Pharmacist Clinical Goal(s): o Over the next 90 days, patient will work with PharmD and providers to achieve A1c goal <7% . Current regimen:   glimepiride 2mg 77mtablet once daily  Insulin glargine (Toujeo Max Solostar), inject 20 units into skin daily  Metformin ER 750mg,64mablet twice daily at 10AM and 5PM  . Interventions: o We discussed: diet and exercise extensively and how to recognize and treat signs of hypoglycemia   Difference mechanism of action of DM regimen and importance of adherence.   . Patient self care activities - Over the next 90 days, patient  will: o Check blood sugar once daily, document, and provide at future appointments o Contact provider with any episodes of hypoglycemia  Hypothyroidism . Pharmacist Clinical Goal(s) o Over the next 90 days, patient will work with PharmD and providers to maintain TSH: 0.35 to 4.5 uIU/mL . Current regimen:  o Euthyrox 137mcg,1mablet once daily before breakfast  . Interventions: o We discussed:  discussed consistent administration of levothyroxine at least 30 minutes before first meal  . Patient self care activities - Over the next 90 days, patient will: o Continue current medications as instructed.   DVT (blood clot) . Pharmacist Clinical Goal(s) o Over the next 90 days, patient will work with PharmD and providers to decrease risk of blood clot reoccurrence  . Current regimen:  o apixaban 2.5mg, 1 74mlet twice daily  . Interventions: o We discussed: monitoring for signs and symptoms for bleeding (coughing up blood, prolonged nose bleeds, black, tarry stools). . Patient self care activities o Patient will continue current medications as instructed  Osteopenia . Pharmacist Clinical Goal(s) o Over  the next 90 days, patient will work with PharmD and providers to maintain proper vitamin D and calcium intake to prevent bone fractures.  . Current regimen:  o Calcium- vitamin D 500-264m, 1 tablet once daily (at night) . Interventions: o Recommend [phone] units of vitamin D daily.  o Recommend 1200 mg of calcium daily from dietary and supplemental sources.  o Recommend weight-bearing and muscle strengthening exercises for building and maintaining bone density . Patient self care activities o Patient will continue current supplement as instructed and incorporate weight-bearing exercises.   Medication management . Pharmacist Clinical Goal(s): o Over the next 90 days, patient will work with PharmD and providers to maintain optimal medication adherence . Current pharmacy:  [facility] . Interventions o Comprehensive medication review performed. o Continue current medication management strategy . Patient self care activities - Over the next 90 days, patient will: o Take medications as prescribed o Report any questions or concerns to PharmD and/or provider(s)  Please see past updates related to this goal by clicking on the "Past Updates" button in the selected goal          Diabetes  A1c goal < 7%  Recent Relevant Labs: Lab Results  Component Value Date/Time   HGBA1C 7.1 (A) 08/26/2020 08:44 AM   HGBA1C 8.1 (A) 05/26/2020 10:16 AM   HGBA1C 9.3 (H) 03/18/2019 10:33 AM   HGBA1C 7.9 (H) 07/12/2017 03:46 AM    Checking BG: Daily  Recent FBG Readings: bad day 130-140, usually closer to 100  Patient has failed these meds in past: B[place]ola([employer])   Patient is currently better controlled (per reported BG readings) on the following medications:   glimepiride 240m,1 tablet once daily  Insulin glargine (Toujeo Max [employer] inject 20 units into skin daily  Metformin ER 75077m1 tablet twice daily at 10AM and 5PM   Last diabetic Eye exam:  Lab Results  Component Value Date/Time   HMDIABEYEEXA No Retinopathy 06/06/2019 12:00 AM  -  Had cataract surgery a year ago  - reports has eye check-in in October   Last diabetic Foot  exam:  Lab Results  Component Value Date/Time   HMDIABFOOTEX normal 12/22/2014 12:00 AM  -  Denies neuropathy/ numbness  We discussed: diet and exercise extensively and how to recognize and treat signs of hypoglycemia   Difference mechanism of action of DM regimen and importance of adherence.    Patient reports she has had side effects with higher dose of metformin.  Patient denies symptoms of hypoglycemia and the lowest she has seen is 80.  Plan Consider stopping glimepiride to see if she is still getting benefit from sulfonylurea as she has been taking since 2012 and is on a low dose. Continue current  medications   Hypertension   Patient gets a headache if she skips BP medicine.   Denies dizziness/ lightheadedness/ orthostatic hypotension.   Office blood pressures are  BP Readings from Last 3 Encounters:  08/26/20 (!) 130/92  05/26/20 126/82  02/24/20 124/80   Patient has failed these meds in the past: none   Patient checks BP at home weekly  Patient home BP readings are ranging: ok numbers but could not provide specific readings  Patient is controlled on:   Amlodipine 5mg55m tablet once daily  Lisinopril/HCTZ  20-25mg57mtablet once daily  Metoprolol tartrate 50mg,10mablet twice daily  We discussed different mechanism of action of HTN regimen and importance of adherence   Plan Continue current medications  Hyperlipidemia   LDL goal < 70  Lipid Panel     Component Value Date/Time   CHOL 184 11/25/2019 1502   TRIG 251.0 (H) 11/25/2019 1502   HDL 47.70 11/25/2019 1502   LDLCALC 118 (H) 04/05/2017 0919   LDLDIRECT 91.0 11/25/2019 1502    Hepatic Function Latest Ref Rng & Units 11/25/2019 12/17/2018 07/12/2017  Total Protein 6.0 - 8.3 g/dL 6.8 6.7 5.2(L)  Albumin 3.5 - 5.2 g/dL 4.2 4.2 2.3(L)  AST 0 - 37 U/L '14 12 29  ' ALT 0 - 35 U/L 14 12 42  Alk Phosphatase 39 - 117 U/L 133(H) 268(H) 74  Total Bilirubin 0.2 - 1.2 mg/dL 0.5 0.5 0.3  Bilirubin, Direct 0.0 - 0.3 mg/dL 0.1 0.1 -     The ASCVD Risk score Mikey Bussing DC Jr., et al., 2013) failed to calculate for the following reasons:   The 2013 ASCVD risk score is only valid for ages 51 to 58   Patient has failed these meds in past: none   Patient is currently controlled on the following medications:  . Pravastatin 75m, 1 tablet once daily   We discussed:  diet and exercise extensively (see above)   Plan Continue current medications  Patient has follow up scheduled with PCP in December and lipid panel will be repeated - can reassess increasing based on LDL results.  DVT    Patient has failed these meds  in past: none   Patient is currently controlled on the following medications:  . apixaban 2.5101m 1 tablet twice daily   We discussed: monitoring for signs and symptoms for bleeding (coughing up blood, prolonged nose bleeds, black, tarry stools).  Plan Continue current medications   Hypothyroidism   Lab Results  Component Value Date/Time   TSH 3.42 11/25/2019 03:02 PM   TSH 3.51 12/17/2018 03:05 PM   Patient is currently controlled on the following medications:  . Euthyrox 13767m 1 tablet once daily before breakfast   We discussed:  discussed consistent administration of levothyroxine at least 30 minutes before first meal   Plan Continue current medications  Primary prevention ASCVD   Patient reports she has not been taking recently and will start taking it if she needs to.  Denies MI/ stroke/ heart procedures, etc.  Patient is on the following medications:  . Aspirin 71m62m tablet once daily (currently not taking)  We discussed:  Increased risk of bleeding with aspirin and apixaban.   Plan Recommended not to restart aspirin  Osteopenia    Last DEXA Scan: 03/03/2016  T-Score femoral neck: RFN: -1.4; LFN: -1.2  T-Score lumbar spine: -0.6  10-year probability of major osteoporotic fracture: 11.5%  10-year probability of hip fracture: 2.4%  No results found for: VD25OH   Patient is not a candidate for pharmacologic treatment  Patient has failed these meds in past: none Patient is currently controlled on the following medications:  . Calcium - vitamin D 500-200mg55mtablet once daily (at night)   We discussed:  Recommend [phone] units of vitamin D daily. Recommend 1200 mg of calcium daily from dietary and supplemental sources. Recommend weight-bearing and muscle strengthening exercises for building and maintaining bone density.  Plan Continue current medications  Pain  Patient reported pain limits physical activity.   Patient is currently uncontrolled on  the following medications:  . No medications . Has considered ibuprofen as needed  We discussed:  Avoiding ibuprofen due to bleed risk and using Tylenol for pain  Plan Recommend OTC acetaminophen  for pain versus ibuprofen (safer option in regards to bleeding risk with apixaban).   Vaccines   Reviewed and discussed patient's vaccination history.    Immunization History  Administered Date(s) Administered  . Fluad Quad(high Dose 65+) 08/26/2020  . Influenza Split 09/25/2012  . Influenza,inj,Quad PF,6+ Mos 08/30/2013, 12/22/2014  . PFIZER SARS-COV-2 Vaccination 01/03/2020, 01/24/2020  . Pneumococcal Polysaccharide-23 09/25/2012  . Tdap 12/26/2009   Plan Recommended patient receive tetanus and Pneumovax 23 vaccines in office/at pharmacy.   Medication Management   Pt uses [facility] for all medications Uses pill box? Yes  We discussed: Discussed benefits of medication synchronization, packaging and delivery as well as enhanced pharmacist oversight with [employer].  Plan  Continue current medication management strategy   Follow up: 3 month phone visit   Jeni Salles, PharmD Clinical Pharmacist [facility] [place] [phone]

## 2020-10-01 NOTE — Patient Instructions (Addendum)
Hi Michaela Rhodes,  It was lovely getting to speak with you over the phone. As we discussed, continue checking your blood sugars and blood pressure and keep a log of the numbers. I also wanted to congratulate you again on lowering your A1c and to continue working on making some of those diet and exercise changes in order to keep those blood sugars at goal and lower cholesterol.  Please call me if you need anything or have any questions before our next touch base!  Best, Maddie  Jeni Salles, PharmD Clinical Pharmacist [facility]    Visit Information  Goals Addressed            This Visit's Progress   . Pharmacy Care Plan       CARE PLAN ENTRY (see longitudinal plan of care for additional care plan information)  Current Barriers:  . Chronic Disease Management support, education, and care coordination needs related to Hypertension, Hyperlipidemia, Diabetes, Hypothyroidism, and Osteopenia, DVT   Hypertension BP Readings from Last 3 Encounters:  08/26/20 (!) 130/92  05/26/20 126/82  02/24/20 124/80   . Pharmacist Clinical Goal(s): o Over the next 90 days, patient will work with PharmD and providers to maintain BP goal <130/80 . Current regimen:   Amlodipine 5mg , 1 tablet once daily  Lisinopril/HCTZ  20-25mg , 1 tablet once daily  Metoprolol tartrate 50mg , 1 tablet twice daily . Interventions: o We discussed differenct mechanism of action of HTN regimen and importance of adherence  . Patient self care activities - Over the next 90 days, patient will: o Check blood pressure 2 to 3 times per month, document, and provide at future appointments o Ensure daily salt intake < 2300 mg/day  Hyperlipidemia Lab Results  Component Value Date/Time   LDLCALC 118 (H) 04/05/2017 09:19 AM   LDLDIRECT 91.0 11/25/2019 03:02 PM   . Pharmacist Clinical Goal(s): o Over the next 90 days, patient will work with PharmD and providers to achieve LDL goal <  70 . Current regimen:  o Pravastatin 40mg , 1 tablet once daily  . Interventions: . We discussed how a diet high in plant sterols (fruits/vegetables/nuts/whole grains/legumes) may reduce your cholesterol.  Encouraged increasing fiber to a daily intake of 10-25g/day  . Patient self care activities - Over the next 90 days, patient will: o Continue lifestyle modifications (diet and physical activity)  Diabetes Lab Results  Component Value Date/Time   HGBA1C 7.1 (A) 08/26/2020 08:44 AM   HGBA1C 8.1 (A) 05/26/2020 10:16 AM   HGBA1C 9.3 (H) 03/18/2019 10:33 AM   HGBA1C 7.9 (H) 07/12/2017 03:46 AM   . Pharmacist Clinical Goal(s): o Over the next 90 days, patient will work with PharmD and providers to achieve A1c goal <7% . Current regimen:   glimepiride 2mg  ,1 tablet once daily  Insulin glargine (Toujeo Max Solostar), inject 20 units into skin daily  Metformin ER 750mg , 1 tablet twice daily at 10AM and 5PM  . Interventions: o We discussed: diet and exercise extensively and how to recognize and treat signs of hypoglycemia   Difference mechanism of action of DM regimen and importance of adherence.   . Patient self care activities - Over the next 90 days, patient will: o Check blood sugar once daily, document, and provide at future appointments o Contact provider with any episodes of hypoglycemia  Hypothyroidism . Pharmacist Clinical Goal(s) o Over the next 90 days, patient will work with PharmD and providers to maintain TSH: 0.35 to 4.5 uIU/mL . Current regimen:  o Euthyrox 169mcg, 1 tablet once daily before breakfast  . Interventions: o We discussed:  discussed consistent administration of levothyroxine at least 30 minutes before first meal  . Patient self care activities - Over the next 90 days, patient will: o Continue current medications as instructed.   DVT (blood clot) . Pharmacist Clinical Goal(s) o Over the next 90 days, patient will work with PharmD and providers to  decrease risk of blood clot reoccurrence  . Current regimen:  o apixaban 2.5mg , 1 tablet twice daily  . Interventions: o We discussed: monitoring for signs and symptoms for bleeding (coughing up blood, prolonged nose bleeds, black, tarry stools). . Patient self care activities o Patient will continue current medications as instructed  Osteopenia . Pharmacist Clinical Goal(s) o Over the next 90 days, patient will work with PharmD and providers to maintain proper vitamin D and calcium intake to prevent bone fractures.  . Current regimen:  o Calcium- vitamin D 500-200mg , 1 tablet once daily (at night) . Interventions: o Recommend [phone] units of vitamin D daily.  o Recommend 1200 mg of calcium daily from dietary and supplemental sources.  o Recommend weight-bearing and muscle strengthening exercises for building and maintaining bone density . Patient self care activities o Patient will continue current supplement as instructed and incorporate weight-bearing exercises.   Medication management . Pharmacist Clinical Goal(s): o Over the next 90 days, patient will work with PharmD and providers to maintain optimal medication adherence . Current pharmacy: [facility] . Interventions o Comprehensive medication review performed. o Continue current medication management strategy . Patient self care activities - Over the next 90 days, patient will: o Take medications as prescribed o Report any questions or concerns to PharmD and/or provider(s)  Please see past updates related to this goal by clicking on the "Past Updates" button in the selected goal         The patient verbalized understanding of instructions provided today and declined a print copy of patient instruction materials.   Telephone follow up appointment with pharmacy team member scheduled for: 3 months

## 2020-10-14 ENCOUNTER — Other Ambulatory Visit: Payer: Self-pay | Admitting: Family Medicine

## 2020-10-21 DIAGNOSIS — M47816 Spondylosis without myelopathy or radiculopathy, lumbar region: Secondary | ICD-10-CM | POA: Diagnosis not present

## 2020-11-09 DIAGNOSIS — M47816 Spondylosis without myelopathy or radiculopathy, lumbar region: Secondary | ICD-10-CM | POA: Diagnosis not present

## 2020-11-23 DIAGNOSIS — M47817 Spondylosis without myelopathy or radiculopathy, lumbosacral region: Secondary | ICD-10-CM | POA: Diagnosis not present

## 2020-11-25 ENCOUNTER — Other Ambulatory Visit: Payer: Self-pay

## 2020-11-25 ENCOUNTER — Ambulatory Visit (INDEPENDENT_AMBULATORY_CARE_PROVIDER_SITE_OTHER): Payer: Medicare HMO | Admitting: Family Medicine

## 2020-11-25 ENCOUNTER — Encounter: Payer: Self-pay | Admitting: Family Medicine

## 2020-11-25 VITALS — BP 124/80 | Ht 62.5 in | Wt 226.0 lb

## 2020-11-25 DIAGNOSIS — E118 Type 2 diabetes mellitus with unspecified complications: Secondary | ICD-10-CM | POA: Diagnosis not present

## 2020-11-25 DIAGNOSIS — E785 Hyperlipidemia, unspecified: Secondary | ICD-10-CM | POA: Diagnosis not present

## 2020-11-25 DIAGNOSIS — Z23 Encounter for immunization: Secondary | ICD-10-CM

## 2020-11-25 DIAGNOSIS — Z79899 Other long term (current) drug therapy: Secondary | ICD-10-CM | POA: Diagnosis not present

## 2020-11-25 DIAGNOSIS — I10 Essential (primary) hypertension: Secondary | ICD-10-CM

## 2020-11-25 DIAGNOSIS — E039 Hypothyroidism, unspecified: Secondary | ICD-10-CM

## 2020-11-25 MED ORDER — LEVOTHYROXINE SODIUM 137 MCG PO TABS
ORAL_TABLET | ORAL | 3 refills | Status: DC
Start: 2020-11-25 — End: 2021-11-30

## 2020-11-25 NOTE — Progress Notes (Signed)
Established Patient Office Visit  Subjective:  Patient ID: Michaela Rhodes, female    DOB: 10/21/1936  Age: 84 y.o. MRN: [id number]  CC:  Chief Complaint  Patient presents with  . Follow-up    HPI [name] presents for medical follow-up.  She has history of hypertension, diastolic heart failure, history of DVT, type 2 diabetes, hypothyroidism, dyslipidemia.  She had past history of breast cancer.  She is on multiple medications and these were reviewed.  She remains on a dose of Eliquis for DVT/PE prevention.  Needs refills on her thyroid medication.  Due for follow-up on multiple labs today.  Blood pressure was slightly elevated last visit.  We recommend weight loss but unfortunately her weight is up 2 pounds.  Blood pressure however is improved today.  No recent headaches or dizziness.  No chest pain.  Compliant with all medications  She has had flu vaccine but we have no record of Prevnar 13.  Past Medical History:  Diagnosis Date  . Cancer [facility])    breast cancer  . DM2 (diabetes mellitus, type 2) ([place])   . Hyperlipidemia   . Hypertension   . Hypothyroidism   . Panic attacks    mild  . SVT (supraventricular tachycardia) (HCC)    in the past  . Wears glasses     Past Surgical History:  Procedure Laterality Date  . ABDOMINAL HYSTERECTOMY     BSO as well  . APPENDECTOMY    . APPLICATION OF A-CELL OF EXTREMITY Left 01/15/2015   Procedure: APPLICATION OF A-CELL OF EXTREMITY;  Surgeon: Theodoro Kos, DO;  Location: [place];  Service: Plastics;  Laterality: Left;  . CHOLECYSTECTOMY    . I & D EXTREMITY Left 01/15/2015   Procedure: IRRIGATION AND DEBRIDEMENT EXTREMITY;  Surgeon: Theodoro Kos, DO;  Location: [place];  Service: Plastics;  Laterality: Left;  . IR KYPHO EA ADDL LEVEL THORACIC OR LUMBAR  05/10/2017  . IR KYPHO THORACIC WITH BONE BIOPSY  05/10/2017  . IR RADIOLOGIST EVAL & MGMT  04/28/2017  . MASTECTOMY  1985   Left  .  RECONSTRUCTION BREAST W/ LATISSIMUS DORSI FLAP    . TOTAL SHOULDER ARTHROPLASTY  05/27/2012   Procedure: TOTAL SHOULDER ARTHROPLASTY;  Surgeon: Johnny Bridge, MD;  Location: [place];  Service: Orthopedics;  Laterality: Right;    Family History  Problem Relation Age of Onset  . Heart disease Mother   . Hypertension Mother   . Stroke Father   . Hypertension Father   . Coronary artery disease Other        family hx of  . Diabetes Other        family hx of  . Hypertension Other        family hx of  . Hypertension Sister     Social History   Socioeconomic History  . Marital status: Widowed    Spouse name: Not on file  . Number of children: Not on file  . Years of education: Not on file  . Highest education level: Not on file  Occupational History  . Occupation: retired    Comment: had [employer] business  Tobacco Use  . Smoking status: Never Smoker  . Smokeless tobacco: Never Used  Vaping Use  . Vaping Use: Never used  Substance and Sexual Activity  . Alcohol use: No  . Drug use: No  . Sexual activity: Never  Other Topics Concern  . Not on file  Social  History Narrative  . Not on file   Social Determinants of Health   Financial Resource Strain: Low Risk   . Difficulty of Paying Living Expenses: Not hard at all  Food Insecurity:   . Worried About Charity fundraiser in the Last Year: Not on file  . Ran Out of Food in the Last Year: Not on file  Transportation Needs: No Transportation Needs  . Lack of Transportation (Medical): No  . Lack of Transportation (Non-Medical): No  Physical Activity:   . Days of Exercise per Week: Not on file  . Minutes of Exercise per Session: Not on file  Stress:   . Feeling of Stress : Not on file  Social Connections:   . Frequency of Communication with Friends and Family: Not on file  . Frequency of Social Gatherings with Friends and Family: Not on file  . Attends Religious Services: Not on file  . Active Member of Clubs or  Organizations: Not on file  . Attends Archivist Meetings: Not on file  . Marital Status: Not on file  Intimate Partner Violence:   . Fear of Current or Ex-Partner: Not on file  . Emotionally Abused: Not on file  . Physically Abused: Not on file  . Sexually Abused: Not on file    Outpatient Medications Prior to Visit  Medication Sig Dispense Refill  . amLODipine (NORVASC) 5 MG tablet Take 1 tablet by mouth once daily 90 tablet 0  . apixaban (ELIQUIS) 2.5 MG TABS tablet Take 1 tablet (2.5 mg total) by mouth 2 (two) times daily. 180 tablet 1  . Blood Glucose Monitoring Suppl (ACCU-CHEK GUIDE) w/Device KIT 1 Device by Other route daily. Use to test blood sugar once daily. Dx Code E11.65 1 kit 1  . calcium-vitamin D (OSCAL WITH D) 500-200 MG-UNIT per tablet Take 1 tablet by mouth daily. 100 tablet 2  . glimepiride (AMARYL) 2 MG tablet Take 1 tablet by mouth once daily 90 tablet 1  . glucose blood (ACCU-CHEK GUIDE) test strip Use as instructed to test blood sugar once daily. Dx Code E11.65 100 each 3  . insulin glargine, 2 Unit Dial, (TOUJEO MAX SOLOSTAR) 300 UNIT/ML Solostar Pen Inject 16 Units into the skin daily. (Patient taking differently: Inject 24 Units into the skin daily. ) 3 pen 2  . Insulin Pen Needle (BD PEN NEEDLE NANO U/F) 32G X 4 MM MISC Use to inject insulin once daily. Dx Code E11.65 100 each 1  . lisinopril-hydrochlorothiazide (ZESTORETIC) 20-25 MG tablet Take 1 tablet by mouth once daily 90 tablet 0  . metFORMIN (GLUCOPHAGE-XR) 750 MG 24 hr tablet TAKE 1 TABLET BY MOUTH TWICE DAILY AT  10  IN  THE  MORNING  AND  5  IN  THE  EVENING 180 tablet 0  . metoprolol tartrate (LOPRESSOR) 50 MG tablet Take 1 tablet by mouth twice daily 180 tablet 0  . pravastatin (PRAVACHOL) 40 MG tablet Take 1 tablet by mouth once daily 90 tablet 0  . aspirin EC 81 MG EC tablet Take 1 tablet (81 mg total) by mouth daily.    Arna Medici 137 MCG tablet TAKE 1 TABLET BY MOUTH ONCE DAILY BEFORE  BREAKFAST 90 tablet 0   No facility-administered medications prior to visit.    Allergies  Allergen Reactions  . Penicillins Anaphylaxis and Hives    Has patient had a PCN reaction causing immediate rash, facial/tongue/throat swelling, SOB or lightheadedness with hypotension: Yes Has patient had a PCN  reaction causing severe rash involving mucus membranes or skin necrosis: Yes Has patient had a PCN reaction that required hospitalization: No Has patient had a PCN reaction occurring within the last 10 years: Yes If all of the above answers are "NO", then may proceed with Cephalosporin use.   Marland Kitchen Keflex [Cephalexin]     Rash     ROS Review of Systems  Constitutional: Negative for fatigue.  Eyes: Negative for visual disturbance.  Respiratory: Negative for cough, chest tightness, shortness of breath and wheezing.   Cardiovascular: Negative for chest pain, palpitations and leg swelling.  Neurological: Negative for dizziness, seizures, syncope, weakness, light-headedness and headaches.      Objective:    Physical Exam Constitutional:      Appearance: She is well-developed.  Eyes:     Pupils: Pupils are equal, round, and reactive to light.  Neck:     Thyroid: No thyromegaly.     Vascular: No JVD.  Cardiovascular:     Rate and Rhythm: Regular rhythm.     Heart sounds: No gallop.   Pulmonary:     Effort: Pulmonary effort is normal. No respiratory distress.     Breath sounds: Normal breath sounds. No wheezing or rales.  Musculoskeletal:     Cervical back: Neck supple.     Right lower leg: No edema.     Left lower leg: No edema.  Neurological:     Mental Status: She is alert.     BP 124/80   Ht 5' 2.5" (1.588 m)   Wt 226 lb (102.5 kg)   BMI 40.68 kg/m  Wt Readings from Last 3 Encounters:  11/25/20 226 lb (102.5 kg)  08/26/20 224 lb (101.6 kg)  05/26/20 227 lb 12.8 oz (103.3 kg)     Health Maintenance Due  Topic Date Due  . PNA vac Low Risk Adult (2 of 2 - PCV13)  09/25/2013  . TETANUS/TDAP  12/27/2019    There are no preventive care reminders to display for this patient.  Lab Results  Component Value Date   TSH 3.42 11/25/2019   Lab Results  Component Value Date   WBC 22.4 (H) 11/29/2018   HGB 13.4 11/29/2018   HCT 43.8 11/29/2018   MCV 88.0 11/29/2018   PLT 309 11/29/2018   Lab Results  Component Value Date   NA 139 11/25/2019   K 4.7 11/25/2019   CO2 26 11/25/2019   GLUCOSE 200 (H) 11/25/2019   BUN 35 (H) 11/25/2019   CREATININE 1.27 (H) 11/25/2019   BILITOT 0.5 11/25/2019   ALKPHOS 133 (H) 11/25/2019   AST 14 11/25/2019   ALT 14 11/25/2019   PROT 6.8 11/25/2019   ALBUMIN 4.2 11/25/2019   CALCIUM 9.8 11/25/2019   ANIONGAP 14 11/29/2018   GFR 40.34 (L) 11/25/2019   Lab Results  Component Value Date   CHOL 184 11/25/2019   Lab Results  Component Value Date   HDL 47.70 11/25/2019   Lab Results  Component Value Date   LDLCALC 118 (H) 04/05/2017   Lab Results  Component Value Date   TRIG 251.0 (H) 11/25/2019   Lab Results  Component Value Date   CHOLHDL 4 11/25/2019   Lab Results  Component Value Date   HGBA1C 7.1 (A) 08/26/2020      Assessment & Plan:   Problem List Items Addressed This Visit      Unprioritized   Benign essential HTN - Primary   Relevant Orders   Basic metabolic panel  Hypothyroidism   Relevant Medications   levothyroxine (EUTHYROX) 137 MCG tablet   Other Relevant Orders   TSH   Hyperlipidemia   Relevant Orders   Lipid panel   Hepatic function panel   Diabetes mellitus with complication (HCC)   Relevant Orders   Hemoglobin A1c    Other Visit Diagnoses    High risk medication use       Relevant Orders   CBC with Differential/Platelet   Need for vaccination       Relevant Orders   Pneumococcal conjugate vaccine 13-valent    -Her blood pressure is improved today at follow-up.  We still encourage her to try to lose some weight.  Continue current regimen -Needs several labs  today as above. -Refill levothyroxine for 1 year -Continue low glycemic diet -Continue yearly eye exam -Set up 35-monthfollow-up  Meds ordered this encounter  Medications  . levothyroxine (EUTHYROX) 137 MCG tablet    Sig: TAKE 1 TABLET BY MOUTH ONCE DAILY BEFORE BREAKFAST    Dispense:  90 tablet    Refill:  3    Follow-up: Return in about 3 months (around 02/23/2021).    BCarolann Littler MD

## 2020-11-25 NOTE — Patient Instructions (Signed)
Consider trial of over the counter Flonase for postnasal drip symptoms  Could also add over the counter Claritan.   We gave Prevnar 13 today and that completes the pneumonia vaccine series.

## 2020-11-26 LAB — CBC WITH DIFFERENTIAL/PLATELET
Absolute Monocytes: 490 cells/uL (ref 200–950)
Basophils Absolute: 58 cells/uL (ref 0–200)
Basophils Relative: 0.6 %
Eosinophils Absolute: 413 cells/uL (ref 15–500)
Eosinophils Relative: 4.3 %
HCT: 41.3 % (ref 35.0–45.0)
Hemoglobin: 13.4 g/dL (ref 11.7–15.5)
Lymphs Abs: 2102 cells/uL (ref 850–3900)
MCH: 27.5 pg (ref 27.0–33.0)
MCHC: 32.4 g/dL (ref 32.0–36.0)
MCV: 84.6 fL (ref 80.0–100.0)
MPV: 11.1 fL (ref 7.5–12.5)
Monocytes Relative: 5.1 %
Neutro Abs: 6538 cells/uL (ref 1500–7800)
Neutrophils Relative %: 68.1 %
Platelets: 335 10*3/uL (ref 140–400)
RBC: 4.88 10*6/uL (ref 3.80–5.10)
RDW: 13.4 % (ref 11.0–15.0)
Total Lymphocyte: 21.9 %
WBC: 9.6 10*3/uL (ref 3.8–10.8)

## 2020-11-26 LAB — BASIC METABOLIC PANEL
BUN/Creatinine Ratio: 20 (calc) (ref 6–22)
BUN: 25 mg/dL (ref 7–25)
CO2: 27 mmol/L (ref 20–32)
Calcium: 9.6 mg/dL (ref 8.6–10.4)
Chloride: 106 mmol/L (ref 98–110)
Creat: 1.24 mg/dL — ABNORMAL HIGH (ref 0.60–0.88)
Glucose, Bld: 124 mg/dL — ABNORMAL HIGH (ref 65–99)
Potassium: 4.7 mmol/L (ref 3.5–5.3)
Sodium: 143 mmol/L (ref 135–146)

## 2020-11-26 LAB — HEMOGLOBIN A1C
Hgb A1c MFr Bld: 7.4 % of total Hgb — ABNORMAL HIGH (ref <5.7)
Mean Plasma Glucose: 166 mg/dL
eAG (mmol/L): 9.2 mmol/L

## 2020-11-26 LAB — HEPATIC FUNCTION PANEL
AG Ratio: 1.6 (calc) (ref 1.0–2.5)
ALT: 15 U/L (ref 6–29)
AST: 15 U/L (ref 10–35)
Albumin: 4.1 g/dL (ref 3.6–5.1)
Alkaline phosphatase (APISO): 119 U/L (ref 37–153)
Bilirubin, Direct: 0.1 mg/dL (ref 0.0–0.2)
Globulin: 2.5 g/dL (calc) (ref 1.9–3.7)
Indirect Bilirubin: 0.4 mg/dL (calc) (ref 0.2–1.2)
Total Bilirubin: 0.5 mg/dL (ref 0.2–1.2)
Total Protein: 6.6 g/dL (ref 6.1–8.1)

## 2020-11-26 LAB — LIPID PANEL
Cholesterol: 176 mg/dL (ref <200)
HDL: 46 mg/dL — ABNORMAL LOW (ref >=50)
LDL Cholesterol (Calc): 93 mg/dL (calc)
Non-HDL Cholesterol (Calc): 130 mg/dL (calc) — ABNORMAL HIGH (ref <130)
Total CHOL/HDL Ratio: 3.8 (calc) (ref <5.0)
Triglycerides: 244 mg/dL — ABNORMAL HIGH (ref <150)

## 2020-11-26 LAB — TSH: TSH: 5.71 mIU/L — ABNORMAL HIGH (ref 0.40–4.50)

## 2020-12-17 ENCOUNTER — Other Ambulatory Visit: Payer: Self-pay | Admitting: Family Medicine

## 2020-12-25 ENCOUNTER — Telehealth: Payer: Self-pay | Admitting: Pharmacist

## 2020-12-25 NOTE — Chronic Care Management (AMB) (Signed)
I left the patient a message about her upcoming appointment on 12-28-2020 @ 10 :00 am with the clinical pharmacist. She was asked to please have all medication on hand to review the pharmacist.   Maia Breslow, [place] Assistant [phone]

## 2020-12-28 ENCOUNTER — Telehealth: Payer: Medicare HMO

## 2020-12-28 NOTE — Chronic Care Management (AMB) (Deleted)
Chronic Care Management Pharmacy  Name: Michaela Rhodes  MRN: [id number] DOB: [birth year]  Initial Questions: 1. Have you seen any other providers since your last visit? Yes  2. Any changes in your medicines or health? No   Chief Complaint/ HPI  Michaela Rhodes,  85 y.o. , female presents for their Follow-Up CCM visit with the clinical pharmacist via telephone due to COVID-19 Pandemic.  Patient reported the biggest change since last follow up was that she joined a back program in [place]. She said the MRI shows arthritis instead of anything detereorating and she has only been taking Tylenol over the counter for pain.  PCP : [name], MD  Their chronic conditions include: HTN, DM, HLD,  DVT, hypothyroidism, osteopenia, pain   Office Visits: 11/25/20 Carolann Littler, MD: Patient presented for DM follow up. A1c increased to 7.4%. Received Prevnar and influenza vaccines.  08/26/20 Carolann Littler, MD: Patient presented for DM follow up. A1c improved from 8.1 to 7.1%. DBP was elevated today. Plans to follow up in 3 months to determine if still elevated. Will recheck lipid panel, LFTs, and TSH at follow up.  05/26/2020- Carolann Littler, MD- Patient presented for office visit for follow up. A1c: 8.1%. Patient to check insurance coverage for Levemir, Lantus, Toujeo or [place]. Meantime, increase Basaglar to 16units daily. Eliquis dose decreased to 2.5 twice daily.   Consult Visits: 11/23/20 Wylene Men, MD (pain management): Patient presented for back pain follow up. Performed radiofrequency ablation.  -09/28/20 Wylene Men, MD (pain management): Patient presented for lower back pain and leg pain follow up. Unable to access notes.  Medications: Outpatient Encounter Medications as of 12/28/2020  Medication Sig  . amLODipine (NORVASC) 5 MG tablet Take 1 tablet by mouth once daily  . apixaban (ELIQUIS) 2.5 MG TABS tablet Take 1 tablet (2.5 mg total) by mouth 2 (two) times daily.  .  Blood Glucose Monitoring Suppl (ACCU-CHEK GUIDE) w/Device KIT 1 Device by Other route daily. Use to test blood sugar once daily. Dx Code E11.65  . calcium-vitamin D (OSCAL WITH D) 500-200 MG-UNIT per tablet Take 1 tablet by mouth daily.  Marland Kitchen glimepiride (AMARYL) 2 MG tablet Take 1 tablet by mouth once daily  . glucose blood (ACCU-CHEK GUIDE) test strip Use as instructed to test blood sugar once daily. Dx Code E11.65  . insulin glargine, 2 Unit Dial, (TOUJEO MAX SOLOSTAR) 300 UNIT/ML Solostar Pen Inject 16 Units into the skin daily. (Patient taking differently: Inject 24 Units into the skin daily. )  . Insulin Pen Needle (BD PEN NEEDLE NANO U/F) 32G X 4 MM MISC Use to inject insulin once daily. Dx Code E11.65  . levothyroxine (EUTHYROX) 137 MCG tablet TAKE 1 TABLET BY MOUTH ONCE DAILY BEFORE BREAKFAST  . lisinopril-hydrochlorothiazide (ZESTORETIC) 20-25 MG tablet Take 1 tablet by mouth once daily  . metFORMIN (GLUCOPHAGE-XR) 750 MG 24 hr tablet TAKE 1 TABLET BY MOUTH TWICE DAILY AT  10  IN  THE  MORNING  AND  5  IN  THE  EVENING  . metoprolol tartrate (LOPRESSOR) 50 MG tablet Take 1 tablet by mouth twice daily  . pravastatin (PRAVACHOL) 40 MG tablet Take 1 tablet by mouth once daily   No facility-administered encounter medications on file as of 12/28/2020.     Current Diagnosis/Assessment:  Goals Addressed   None      Diabetes  A1c goal < 7%  Recent Relevant Labs: Lab Results  Component Value Date/Time   HGBA1C 7.4 (H) 11/25/2020  08:40 AM   HGBA1C 7.1 (A) 08/26/2020 08:44 AM   HGBA1C 8.1 (A) 05/26/2020 10:16 AM   HGBA1C 9.3 (H) 03/18/2019 10:33 AM    Checking BG: Daily  Recent FBG Readings: bad day 130-140, usually closer to 100  Patient has failed these meds in past: Basaglar [employer] formulary)   Patient is currently better controlled (per reported BG readings) on the following medications:   glimepiride 102m ,1 tablet once daily  Insulin glargine (Toujeo [employer],  inject 20 units into skin daily  Metformin ER 7541m 1 tablet twice daily at 10AM and 5PM   Last diabetic Eye exam:  Lab Results  Component Value Date/Time   HMDIABEYEEXA No Retinopathy 06/06/2019 12:00 AM  -  Had cataract surgery a year ago  - reports has eye check-in in October   Last diabetic Foot  exam:  Lab Results  Component Value Date/Time   HMDIABFOOTEX normal 12/22/2014 12:00 AM  -  Denies neuropathy/ numbness  We discussed: diet and exercise extensively and how to recognize and treat signs of hypoglycemia   Difference mechanism of action of DM regimen and importance of adherence.    Patient reports she has had side effects with higher dose of metformin.  Patient denies symptoms of hypoglycemia and the lowest she has seen is 80.  Plan Consider stopping glimepiride to see if she is still getting benefit from sulfonylurea as she has been taking since 2012 and is on a low dose. Continue current medications   Hypertension   Patient gets a headache if she skips BP medicine.   Denies dizziness/ lightheadedness/ orthostatic hypotension.   Office blood pressures are  BP Readings from Last 3 Encounters:  11/25/20 124/80  08/26/20 (!) 130/92  05/26/20 126/82   Patient has failed these meds in the past: none   Patient checks BP at home weekly  Patient home BP readings are ranging: ok numbers but could not provide specific readings  Patient is controlled on:   Amlodipine 42m47m1 tablet once daily  Lisinopril/HCTZ  20-242m17m tablet once daily  Metoprolol tartrate 50mg22mtablet twice daily  We discussed different mechanism of action of HTN regimen and importance of adherence   Plan Continue current medications     Hyperlipidemia   LDL goal < 70  Lipid Panel     Component Value Date/Time   CHOL 176 11/25/2020 0840   TRIG 244 (H) 11/25/2020 0840   HDL 46 (L) 11/25/2020 0840   LDLCALC 93 11/25/2020 0840   LDLDIRECT 91.0 11/25/2019 1502    Hepatic  Function Latest Ref Rng & Units 11/25/2020 11/25/2019 12/17/2018  Total Protein 6.1 - 8.1 g/dL 6.6 6.8 6.7  Albumin 3.5 - 5.2 g/dL - 4.2 4.2  AST 10 - 35 U/L _0 ALT 6 - 29 U/L _1 Alk Phosphatase 39 - 117 U/L - 133(H) 268(H)  Total Bilirubin 0.2 - 1.2 mg/dL 0.5 0.5 0.5  Bilirubin, Direct 0.0 - 0.2 mg/dL 0.1 0.1 0.1     The ASCVD Risk score (GoffMikey Bussingr., et al., 2013) failed to calculate for the following reasons:   The 2013 ASCVD risk score is only valid for ages 40 to439   5tient has failed these meds in past: none   Patient is currently controlled on the following medications:  . Pravastatin 40mg,4mablet once daily   We discussed:  diet and exercise extensively (see above)   Plan Continue current medications  Patient  has follow up scheduled with PCP in December and lipid panel will be repeated - can reassess increasing based on LDL results. - increase statin for further TG benefit?  DVT    Patient has failed these meds in past: none   Patient is currently controlled on the following medications:  . apixaban 2.47m, 1 tablet twice daily   We discussed: monitoring for signs and symptoms for bleeding (coughing up blood, prolonged nose bleeds, black, tarry stools).  Plan Continue current medications   Hypothyroidism   Lab Results  Component Value Date/Time   TSH 5.71 (H) 11/25/2020 08:40 AM   TSH 3.42 11/25/2019 03:02 PM   Patient is currently controlled on the following medications:  . Euthyrox 1337m, 1 tablet once daily before breakfast   We discussed:  discussed consistent administration of levothyroxine at least 30 minutes before first meal   Plan Continue current medications  Primary prevention ASCVD   Patient reports she has not been taking recently and will start taking it if she needs to.  Denies MI/ stroke/ heart procedures, etc.  Patient is on the following medications:  . Aspirin 8188m1 tablet once daily (currently not taking)  We  discussed:  Increased risk of bleeding with aspirin and apixaban.   Plan Recommended not to restart aspirin  Osteopenia    Last DEXA Scan: 03/03/2016  T-Score femoral neck: RFN: -1.4; LFN: -1.2  T-Score lumbar spine: -0.6  10-year probability of major osteoporotic fracture: 11.5%  10-year probability of hip fracture: 2.4%  No results found for: VD25OH   Patient is not a candidate for pharmacologic treatment  Patient has failed these meds in past: none Patient is currently controlled on the following medications:  . Calcium - vitamin D 500-200m42m tablet once daily (at night)   We discussed:  Recommend [phone] units of vitamin D daily. Recommend 1200 mg of calcium daily from dietary and supplemental sources. Recommend weight-bearing and muscle strengthening exercises for building and maintaining bone density.  Plan Continue current medications  Pain  Patient reported pain limits physical activity.   Patient is currently uncontrolled on the following medications:  . No medications . Has considered ibuprofen as needed  We discussed:  Avoiding ibuprofen due to bleed risk and using Tylenol for pain  Plan Recommend OTC acetaminophen for pain versus ibuprofen (safer option in regards to bleeding risk with apixaban).   Vaccines   Reviewed and discussed patient's vaccination history.    Immunization History  Administered Date(s) Administered  . Fluad Quad(high Dose 65+) 08/26/2020  . Influenza Split 09/25/2012  . Influenza,inj,Quad PF,6+ Mos 08/30/2013, 12/22/2014  . PFIZER SARS-COV-2 Vaccination 01/03/2020, 01/24/2020  . Pneumococcal Conjugate-13 11/25/2020  . Pneumococcal Polysaccharide-23 09/25/2012  . Tdap 12/26/2009   Plan Recommended patient receive tetanus and Pneumovax 23 vaccines in office/at pharmacy.   Medication Management   [facility] for [facility] medications Uses pill box? Yes  We discussed: Discussed benefits of medication synchronization,  packaging and delivery as well as enhanced pharmacist oversight with [facility].  Plan  Continue current medication management strategy   Follow up: 3 month phone visit   MadeJeni SallesarmD Clinical Pharmacist [phone]

## 2021-01-06 ENCOUNTER — Other Ambulatory Visit: Payer: Self-pay | Admitting: Family Medicine

## 2021-02-02 ENCOUNTER — Telehealth: Payer: Self-pay | Admitting: Family Medicine

## 2021-02-02 NOTE — Telephone Encounter (Signed)
Pt called back and was scheduled.

## 2021-02-02 NOTE — Telephone Encounter (Signed)
Left message for patient to call back and schedule Medicare Annual Wellness Visit (AWV) either virtually or in office. No detailed message left   Last AWVI  No information  please schedule at anytime with [facility] Nurse Health Advisor 1 or 2   This should be a 45 minute visit.

## 2021-02-18 ENCOUNTER — Ambulatory Visit (INDEPENDENT_AMBULATORY_CARE_PROVIDER_SITE_OTHER): Payer: Medicare HMO

## 2021-02-18 ENCOUNTER — Other Ambulatory Visit: Payer: Self-pay

## 2021-02-18 DIAGNOSIS — Z0000 Encounter for general adult medical examination without abnormal findings: Secondary | ICD-10-CM

## 2021-02-18 NOTE — Patient Instructions (Signed)
Michaela Rhodes , Thank you for taking time to come for your Medicare Wellness Visit. I appreciate your ongoing commitment to your health goals. Please review the following plan we discussed and let me know if I can assist you in the future.   Screening recommendations/referrals: Colonoscopy: No longer required  Mammogram: No longer required  Bone Density: No longer required  Recommended yearly ophthalmology/optometry visit for glaucoma screening and checkup Recommended yearly dental visit for hygiene and checkup  Vaccinations: Influenza vaccine: Up to date, next due fall 2022  Pneumococcal vaccine: Completed series  Tdap vaccine: Currently due you may await and minor injury to receive Shingles vaccine: Currently due if you would like to receive you may do so at your local pharmacy as it is less expensive     Advanced directives: Please bring copies of your advanced medical directives into our office so that we may scan into your chart.  Conditions/risks identified: None   Next appointment: None    Preventive Care 65 Years and Older, Female Preventive care refers to lifestyle choices and visits with your health care provider that can promote health and wellness. What does preventive care include?  A yearly physical exam. This is also called an annual well check.  Dental exams once or twice a year.  Routine eye exams. Ask your health care provider how often you should have your eyes checked.  Personal lifestyle choices, including:  Daily care of your teeth and gums.  Regular physical activity.  Eating a healthy diet.  Avoiding tobacco and drug use.  Limiting alcohol use.  Practicing safe sex.  Taking low-dose aspirin every day.  Taking vitamin and mineral supplements as recommended by your health care provider. What happens during an annual well check? The services and screenings done by your health care provider during your annual well check will depend on your age,  overall health, lifestyle risk factors, and family history of disease. Counseling  Your health care provider may ask you questions about your:  Alcohol use.  Tobacco use.  Drug use.  Emotional well-being.  Home and relationship well-being.  Sexual activity.  Eating habits.  History of falls.  Memory and ability to understand (cognition).  Work and work Statistician.  Reproductive health. Screening  You may have the following tests or measurements:  Height, weight, and BMI.  Blood pressure.  Lipid and cholesterol levels. These may be checked every 5 years, or more frequently if you are over 58 years old.  Skin check.  Lung cancer screening. You may have this screening every year starting at age 43 if you have a 30-pack-year history of smoking and currently smoke or have quit within the past 15 years.  Fecal occult blood test (FOBT) of the stool. You may have this test every year starting at age 66.  Flexible sigmoidoscopy or colonoscopy. You may have a sigmoidoscopy every 5 years or a colonoscopy every 10 years starting at age 88.  Hepatitis C blood test.  Hepatitis B blood test.  Sexually transmitted disease (STD) testing.  Diabetes screening. This is done by checking your blood sugar (glucose) after you have not eaten for a while (fasting). You may have this done every 1-3 years.  Bone density scan. This is done to screen for osteoporosis. You may have this done starting at age 75.  Mammogram. This may be done every 1-2 years. Talk to your health care provider about how often you should have regular mammograms. Talk with your health care provider about  your test results, treatment options, and if necessary, the need for more tests. Vaccines  Your health care provider may recommend certain vaccines, such as:  Influenza vaccine. This is recommended every year.  Tetanus, diphtheria, and acellular pertussis (Tdap, Td) vaccine. You may need a Td booster every 10  years.  Zoster vaccine. You may need this after age 46.  Pneumococcal 13-valent conjugate (PCV13) vaccine. One dose is recommended after age 79.  Pneumococcal polysaccharide (PPSV23) vaccine. One dose is recommended after age 52. Talk to your health care provider about which screenings and vaccines you need and how often you need them. This information is not intended to replace advice given to you by your health care provider. Make sure you discuss any questions you have with your health care provider. Document Released: 01/01/2016 Document Revised: 08/24/2016 Document Reviewed: 10/06/2015 Elsevier Interactive Patient Education  2017 [place] Prevention in the Home Falls can cause injuries. They can happen to people of all ages. There are many things you can do to make your home safe and to help prevent falls. What can I do on the outside of my home?  Regularly fix the edges of walkways and driveways and fix any cracks.  Remove anything that might make you trip as you walk through a door, such as a raised step or threshold.  Trim any bushes or trees on the path to your home.  Use bright outdoor lighting.  Clear any walking paths of anything that might make someone trip, such as rocks or tools.  Regularly check to see if handrails are loose or broken. Make sure that both sides of any steps have handrails.  Any raised decks and porches should have guardrails on the edges.  Have any leaves, snow, or ice cleared regularly.  Use sand or salt on walking paths during winter.  Clean up any spills in your garage right away. This includes oil or grease spills. What can I do in the bathroom?  Use night lights.  Install grab bars by the toilet and in the tub and shower. Do not use towel bars as grab bars.  Use non-skid mats or decals in the tub or shower.  If you need to sit down in the shower, use a plastic, non-slip stool.  Keep the floor dry. Clean up any water that  spills on the floor as soon as it happens.  Remove soap buildup in the tub or shower regularly.  Attach bath mats securely with double-sided non-slip rug tape.  Do not have throw rugs and other things on the floor that can make you trip. What can I do in the bedroom?  Use night lights.  Make sure that you have a light by your bed that is easy to reach.  Do not use any sheets or blankets that are too big for your bed. They should not hang down onto the floor.  Have a firm chair that has side arms. You can use this for support while you get dressed.  Do not have throw rugs and other things on the floor that can make you trip. What can I do in the kitchen?  Clean up any spills right away.  Avoid walking on wet floors.  Keep items that you use a lot in easy-to-reach places.  If you need to reach something above you, use a strong step stool that has a grab bar.  Keep electrical cords out of the way.  Do not use floor polish or wax  that makes floors slippery. If you must use wax, use non-skid floor wax.  Do not have throw rugs and other things on the floor that can make you trip. What can I do with my stairs?  Do not leave any items on the stairs.  Make sure that there are handrails on both sides of the stairs and use them. Fix handrails that are broken or loose. Make sure that handrails are as long as the stairways.  Check any carpeting to make sure that it is firmly attached to the stairs. Fix any carpet that is loose or worn.  Avoid having throw rugs at the top or bottom of the stairs. If you do have throw rugs, attach them to the floor with carpet tape.  Make sure that you have a light switch at the top of the stairs and the bottom of the stairs. If you do not have them, ask someone to add them for you. What else can I do to help prevent falls?  Wear shoes that:  Do not have high heels.  Have rubber bottoms.  Are comfortable and fit you well.  Are closed at the  toe. Do not wear sandals.  If you use a stepladder:  Make sure that it is fully opened. Do not climb a closed stepladder.  Make sure that both sides of the stepladder are locked into place.  Ask someone to hold it for you, if possible.  Clearly mark and make sure that you can see:  Any grab bars or handrails.  First and last steps.  Where the edge of each step is.  Use tools that help you move around (mobility aids) if they are needed. These include:  Canes.  Walkers.  Scooters.  Crutches.  Turn on the lights when you go into a dark area. Replace any light bulbs as soon as they burn out.  Set up your furniture so you have a clear path. Avoid moving your furniture around.  If any of your floors are uneven, fix them.  If there are any pets around you, be aware of where they are.  Review your medicines with your doctor. Some medicines can make you feel dizzy. This can increase your chance of falling. Ask your doctor what other things that you can do to help prevent falls. This information is not intended to replace advice given to you by your health care provider. Make sure you discuss any questions you have with your health care provider. Document Released: 10/01/2009 Document Revised: 05/12/2016 Document Reviewed: 01/09/2015 Elsevier Interactive Patient Education  2017 [employer].

## 2021-02-18 NOTE — Progress Notes (Addendum)
Subjective:   Michaela Rhodes is a 85 y.o. female who presents for an Initial Medicare Annual Wellness Visit.  I connected with Michaela Rhodes today by telephone and verified that I am speaking with the correct person using two identifiers. Location patient: home Location provider: work Persons participating in the virtual visit: patient, provider.   I discussed the limitations, risks, security and privacy concerns of performing an evaluation and management service by telephone and the availability of in person appointments. I also discussed with the patient that there may be a patient responsible charge related to this service. The patient expressed understanding and verbally consented to this telephonic visit.    Interactive audio and video telecommunications were attempted between this provider and patient, however failed, due to patient having technical difficulties OR patient did not have access to video capability.  We continued and completed visit with audio only.       Review of Systems    N/A  Cardiac Risk Factors include: advanced age (>45mn, >>88women);diabetes mellitus;dyslipidemia;hypertension     Objective:    Today's Vitals   02/18/21 1125  PainSc: 7    There is no height or weight on file to calculate BMI.  Advanced Directives 02/18/2021 08/03/2017 07/12/2017 05/10/2017 11/29/2016 01/15/2015 01/14/2015  Does Patient Have a Medical Advance Directive? Yes Yes No Yes Yes Yes Yes  Type of AParamedicof [place]ake [place] will Living will - [place] - Living will;Healthcare Power of Attorney  Does patient want to make changes to medical advance directive? - No - Patient declined - No - Patient declined - - -  Copy of [place]? No - copy requested - - Yes No - copy requested - -  Would patient like information on creating a medical advance directive? - -  No - Patient declined - - Yes - Educational materials given -  Pre-existing out of facility DNR order (yellow form or pink MOST form) - - - - - - -    Current Medications (verified) Outpatient Encounter Medications as of 02/18/2021  Medication Sig  . amLODipine (NORVASC) 5 MG tablet Take 1 tablet by mouth once daily  . Blood Glucose Monitoring Suppl (ACCU-CHEK GUIDE) w/Device KIT 1 Device by Other route daily. Use to test blood sugar once daily. Dx Code E11.65  . calcium-vitamin D (OSCAL WITH D) 500-200 MG-UNIT per tablet Take 1 tablet by mouth daily.  .Marland KitchenELIQUIS 2.5 MG TABS tablet Take 1 tablet by mouth twice daily  . glimepiride (AMARYL) 2 MG tablet Take 1 tablet by mouth once daily  . glucose blood (ACCU-CHEK GUIDE) test strip Use as instructed to test blood sugar once daily. Dx Code E11.65  . insulin glargine, 2 Unit Dial, (TOUJEO MAX SOLOSTAR) 300 UNIT/ML Solostar Pen Inject 16 Units into the skin daily. (Patient taking differently: Inject 24 Units into the skin daily.)  . Insulin Pen Needle (BD PEN NEEDLE NANO U/F) 32G X 4 MM MISC Use to inject insulin once daily. Dx Code E11.65  . levothyroxine (EUTHYROX) 137 MCG tablet TAKE 1 TABLET BY MOUTH ONCE DAILY BEFORE BREAKFAST  . lisinopril-hydrochlorothiazide (ZESTORETIC) 20-25 MG tablet Take 1 tablet by mouth once daily  . metFORMIN (GLUCOPHAGE-XR) 750 MG 24 hr tablet TAKE 1 TABLET BY MOUTH TWICE DAILY AT  10  IN  THE  MORNING  AND  5  IN  THE  EVENING  . metoprolol tartrate (LOPRESSOR)  50 MG tablet Take 1 tablet by mouth twice daily  . pravastatin (PRAVACHOL) 40 MG tablet Take 1 tablet by mouth once daily   No facility-administered encounter medications on file as of 02/18/2021.    Allergies (verified) Penicillins and Keflex [cephalexin]   History: Past Medical History:  Diagnosis Date  . Cancer [facility])    breast cancer  . DM2 (diabetes mellitus, type 2) ([place])   . Hyperlipidemia   . Hypertension   . Hypothyroidism   . Panic attacks     mild  . SVT (supraventricular tachycardia) ([facility])    in the past  . Wears glasses    Past Surgical History:  Procedure Laterality Date  . ABDOMINAL HYSTERECTOMY     BSO as well  . APPENDECTOMY    . APPLICATION OF A-CELL OF EXTREMITY Left 01/15/2015   Procedure: APPLICATION OF A-CELL OF EXTREMITY;  Surgeon: Theodoro Kos, DO;  Location: [facility];  Service: Plastics;  Laterality: Left;  . CHOLECYSTECTOMY    . I & D EXTREMITY Left 01/15/2015   Procedure: IRRIGATION AND DEBRIDEMENT EXTREMITY;  Surgeon: Theodoro Kos, DO;  Location: [facility];  Service: Plastics;  Laterality: Left;  . IR KYPHO EA ADDL LEVEL THORACIC OR LUMBAR  05/10/2017  . IR KYPHO THORACIC WITH BONE BIOPSY  05/10/2017  . IR RADIOLOGIST EVAL & MGMT  04/28/2017  . MASTECTOMY  1985   Left  . RECONSTRUCTION BREAST W/ LATISSIMUS DORSI FLAP    . TOTAL SHOULDER ARTHROPLASTY  05/27/2012   Procedure: TOTAL SHOULDER ARTHROPLASTY;  Surgeon: Johnny Bridge, MD;  Location: [facility];  Service: Orthopedics;  Laterality: Right;   Family History  Problem Relation Age of Onset  . Heart disease Mother   . Hypertension Mother   . Stroke Father   . Hypertension Father   . Coronary artery disease Other        family hx of  . Diabetes Other        family hx of  . Hypertension Other        family hx of  . Hypertension Sister    Social History   Socioeconomic History  . Marital status: Widowed    Spouse name: Not on file  . Number of children: Not on file  . Years of education: Not on file  . Highest education level: Not on file  Occupational History  . Occupation: retired    Comment: had [employer] business  Tobacco Use  . Smoking status: Never Smoker  . Smokeless tobacco: Never Used  Vaping Use  . Vaping Use: Never used  Substance and Sexual Activity  . Alcohol use: No  . Drug use: No  . Sexual activity: Never  Other Topics Concern  . Not on file  Social History Narrative  . Not on file    Social Determinants of Health   Financial Resource Strain: Low Risk   . Difficulty of Paying Living Expenses: Not hard at all  Food Insecurity: No Food Insecurity  . Worried About Charity fundraiser in the Last Year: Never true  . Ran Out of Food in the Last Year: Never true  Transportation Needs: No Transportation Needs  . Lack of Transportation (Medical): No  . Lack of Transportation (Non-Medical): No  Physical Activity: Inactive  . Days of Exercise per Week: 0 days  . Minutes of Exercise per Session: 0 min  Stress: No Stress Concern Present  . Feeling of Stress : Not at all  Social Connections: Moderately Integrated  . Frequency of Communication with Friends and Family: Three times a week  . Frequency of Social Gatherings with Friends and Family: More than three times a week  . Attends Religious Services: More than 4 times per year  . Active Member of Clubs or Organizations: Yes  . Attends Archivist Meetings: More than 4 times per year  . Marital Status: Widowed    Tobacco Counseling Counseling given: Not Answered   Clinical Intake:  Pre-visit preparation completed: Yes  Pain : 0-10 Pain Score: 7  Pain Type: Chronic pain Pain Location: Back Pain Onset: More than a month ago Pain Frequency: Constant Pain Relieving Factors: Ibuprofen  Pain Relieving Factors: Ibuprofen  Nutritional Risks: None Diabetes: Yes CBG done?: No Did pt. bring in CBG monitor from home?: No  How often do you need to have someone help you when you read instructions, pamphlets, or other written materials from your doctor or pharmacy?: 1 - Never  Diabetic?Yes Nutrition Risk Assessment:  Has the patient had any N/V/D within the last 2 months?  No  Does the patient have any non-healing wounds?  No  Has the patient had any unintentional weight loss or weight gain?  No   Diabetes:  Is the patient diabetic?  Yes  If diabetic, was a CBG obtained today?  No  Did the patient  bring in their glucometer from home?  No  How often do you monitor your CBG's? Patient states she checks her glucose once per day .   Financial Strains and Diabetes Management:  Are you having any financial strains with the device, your supplies or your medication? No .  Does the patient want to be seen by Chronic Care Management for management of their diabetes?  No  Would the patient like to be referred to a Nutritionist or for Diabetic Management?  No   Diabetic Exams:  Diabetic Eye Exam: Completed 03/03/2020 Diabetic Foot Exam: Completed 08/26/2020   Interpreter Needed?: No  Information entered by :: [facility] of Daily Living In your present state of health, do you have any difficulty performing the following activities: 02/18/2021  Hearing? N  Vision? N  Difficulty concentrating or making decisions? N  Walking or climbing stairs? N  Dressing or bathing? N  Doing errands, shopping? N  Preparing Food and eating ? N  Using the Toilet? N  In the past six months, have you accidently leaked urine? Y  Do you have problems with loss of bowel control? N  Managing your Medications? N  Managing your Finances? N  Housekeeping or managing your Housekeeping? N  Some recent data might be hidden    Patient Care Team: [name], MD as PCP - General (Family Medicine) Elease Hashimoto, Alinda Sierras, MD (Family Medicine) Viona Gilmore, [facility] as Pharmacist (Pharmacist)  Indicate any recent Medical Services you may have received from other than [facility] providers in the past year (date may be approximate).     Assessment:   This is a routine wellness examination for [place].  Hearing/Vision screen  Hearing Screening   125Hz 250Hz 500Hz 1000Hz 2000Hz 3000Hz 4000Hz 6000Hz 8000Hz  Right ear:           Left ear:           Vision Screening Comments: States gets examined once per year. Has hx of cataract surgery. Wears reading glasses with really fine print   Dietary issues and  exercise activities discussed: Current Exercise  Habits: The patient does not participate in regular exercise at present  Goals    . Pharmacy Care Plan     CARE PLAN ENTRY (see longitudinal plan of care for additional care plan information)  Current Barriers:  . Chronic Disease Management support, education, and care coordination needs related to Hypertension, Hyperlipidemia, Diabetes, Hypothyroidism, and Osteopenia, DVT   Hypertension BP Readings from Last 3 Encounters:  08/26/20 (!) 130/92  05/26/20 126/82  02/24/20 124/80   . Pharmacist Clinical Goal(s): o Over the next 90 days, patient will work with PharmD and providers to maintain BP goal <130/80 . Current regimen:   Amlodipine 5mg , 1 tablet once daily  Lisinopril/HCTZ  20-25mg , 1 tablet once daily  Metoprolol tartrate 50mg , 1 tablet twice daily . Interventions: o We discussed differenct mechanism of action of HTN regimen and importance of adherence  . Patient self care activities - Over the next 90 days, patient will: o Check blood pressure 2 to 3 times per month, document, and provide at future appointments o Ensure daily salt intake < 2300 mg/day  Hyperlipidemia Lab Results  Component Value Date/Time   LDLCALC 118 (H) 04/05/2017 09:19 AM   LDLDIRECT 91.0 11/25/2019 03:02 PM   . Pharmacist Clinical Goal(s): o Over the next 90 days, patient will work with PharmD and providers to achieve LDL goal < 70 . Current regimen:  o Pravastatin 40mg , 1 tablet once daily  . Interventions: . We discussed how a diet high in plant sterols (fruits/vegetables/nuts/whole grains/legumes) may reduce your cholesterol.  Encouraged increasing fiber to a daily intake of 10-25g/day  . Patient self care activities - Over the next 90 days, patient will: o Continue lifestyle modifications (diet and physical activity)  Diabetes Lab Results  Component Value Date/Time   HGBA1C 7.1 (A) 08/26/2020 08:44 AM   HGBA1C 8.1 (A) 05/26/2020 10:16 AM    HGBA1C 9.3 (H) 03/18/2019 10:33 AM   HGBA1C 7.9 (H) 07/12/2017 03:46 AM   . Pharmacist Clinical Goal(s): o Over the next 90 days, patient will work with PharmD and providers to achieve A1c goal <7% . Current regimen:   glimepiride 2mg  ,1 tablet once daily  Insulin glargine (Toujeo Max Solostar), inject 20 units into skin daily  Metformin ER 750mg , 1 tablet twice daily at 10AM and 5PM  . Interventions: o We discussed: diet and exercise extensively and how to recognize and treat signs of hypoglycemia   Difference mechanism of action of DM regimen and importance of adherence.   . Patient self care activities - Over the next 90 days, patient will: o Check blood sugar once daily, document, and provide at future appointments o Contact provider with any episodes of hypoglycemia  Hypothyroidism . Pharmacist Clinical Goal(s) o Over the next 90 days, patient will work with PharmD and providers to maintain TSH: 0.35 to 4.5 uIU/mL . Current regimen:  o Euthyrox 143mcg, 1 tablet once daily before breakfast  . Interventions: o We discussed:  discussed consistent administration of levothyroxine at least 30 minutes before first meal  . Patient self care activities - Over the next 90 days, patient will: o Continue current medications as instructed.   DVT (blood clot) . Pharmacist Clinical Goal(s) o Over the next 90 days, patient will work with PharmD and providers to decrease risk of blood clot reoccurrence  . Current regimen:  o apixaban 2.5mg , 1 tablet twice daily  . Interventions: o We discussed: monitoring for signs and symptoms for bleeding (coughing up blood, prolonged nose bleeds, black,  tarry stools). . Patient self care activities o Patient will continue current medications as instructed  Osteopenia . Pharmacist Clinical Goal(s) o Over the next 90 days, patient will work with PharmD and providers to maintain proper vitamin D and calcium intake to prevent bone fractures.   . Current regimen:  o Calcium- vitamin D 500-219m, 1 tablet once daily (at night) . Interventions: o Recommend [phone] units of vitamin D daily.  o Recommend 1200 mg of calcium daily from dietary and supplemental sources.  o Recommend weight-bearing and muscle strengthening exercises for building and maintaining bone density . Patient self care activities o Patient will continue current supplement as instructed and incorporate weight-bearing exercises.   Medication management . Pharmacist Clinical Goal(s): o Over the next 90 days, patient will work with PharmD and providers to maintain optimal medication adherence . Current pharmacy: [facility] . Interventions o Comprehensive medication review performed. o Continue current medication management strategy . Patient self care activities - Over the next 90 days, patient will: o Take medications as prescribed o Report any questions or concerns to PharmD and/or provider(s)  Please see past updates related to this goal by clicking on the "Past Updates" button in the selected goal      . Weight (lb) < 200 lb (90.7 kg)      Depression Screen PHQ 2/9 Scores 02/18/2021 05/26/2020 12/17/2018 04/05/2017 03/31/2016 12/22/2014 12/22/2014  PHQ - 2 Score 0 0 0 0 0 0 0    Fall Risk Fall Risk  02/18/2021 05/26/2020 12/17/2018 04/05/2017 03/31/2016  Falls in the past year? 0 0 0 No No  Number falls in past yr: 0 0 - - -  Injury with Fall? 0 0 - - -  Risk for fall due to : Orthopedic patient;History of fall(s) - - - -  Follow up Falls evaluation completed;Falls prevention discussed - - - -    FALL RISK PREVENTION PERTAINING TO THE HOME:  Any stairs in or around the home? Yes  If so, are there any without handrails? No  Home free of loose throw rugs in walkways, pet beds, electrical cords, etc? Yes  Adequate lighting in your home to reduce risk of falls? Yes   ASSISTIVE DEVICES UTILIZED TO PREVENT FALLS:  Life alert? No  Use of a cane, walker or w/c? Yes   Grab bars in the bathroom? No  Shower chair or bench in shower? No  Elevated toilet seat or a handicapped toilet? No    Cognitive Function:   Normal cognitive status assessed by direct observation by this Nurse Health Advisor. No abnormalities found.         Immunizations Immunization History  Administered Date(s) Administered  . Fluad Quad(high Dose 65+) 08/26/2020  . Influenza Split 09/25/2012  . Influenza,inj,Quad PF,6+ Mos 08/30/2013, 12/22/2014  . PFIZER(Purple Top)SARS-COV-2 Vaccination 01/03/2020, 01/24/2020, 12/08/2020  . Pneumococcal Conjugate-13 11/25/2020  . Pneumococcal Polysaccharide-23 09/25/2012  . Tdap 12/26/2009    TDAP status: Due, Education has been provided regarding the importance of this vaccine. Advised may receive this vaccine at local pharmacy or Health Dept. Aware to provide a copy of the vaccination record if obtained from local pharmacy or Health Dept. Verbalized acceptance and understanding.  Flu Vaccine status: Up to date  Pneumococcal vaccine status: Completed during today's visit.  Covid-19 vaccine status: Completed vaccines  Qualifies for Shingles Vaccine? Yes   Zostavax completed No   Shingrix Completed?: No.    Education has been provided regarding the importance of this vaccine. Patient has  been advised to call insurance company to determine out of pocket expense if they have not yet received this vaccine. Advised may also receive vaccine at local pharmacy or Health Dept. Verbalized acceptance and understanding.  Screening Tests Health Maintenance  Topic Date Due  . TETANUS/TDAP  12/27/2019  . OPHTHALMOLOGY EXAM  03/03/2021  . HEMOGLOBIN A1C  05/26/2021  . COVID-19 Vaccine (4 - Booster for Pfizer series) 06/08/2021  . FOOT EXAM  08/26/2021  . INFLUENZA VACCINE  Completed  . DEXA SCAN  Completed  . PNA vac Low Risk Adult  Completed  . HPV VACCINES  Aged Out    Health Maintenance  Health Maintenance Due  Topic Date Due  .  TETANUS/TDAP  12/27/2019    Colorectal cancer screening: No longer required.   Mammogram status: No longer required due to age.  Bone Density status: Completed 03/03/2016. Results reflect: Bone density results: OSTEOPENIA. Repeat every 5 years.  Lung Cancer Screening: (Low Dose CT Chest recommended if Age 56-80 years, 30 pack-year currently smoking OR have quit w/in 15years.) does not qualify.   Lung Cancer Screening Referral: N/A   Additional Screening:  Hepatitis C Screening: does not qualify;   Vision Screening: Recommended annual ophthalmology exams for early detection of glaucoma and other disorders of the eye. Is the patient up to date with their annual eye exam?  Yes  Who is the provider or what is the name of the office in which the patient attends annual eye exams? [facility] If pt is not established with a provider, would they like to be referred to a provider to establish care? No .   Dental Screening: Recommended annual dental exams for proper oral hygiene  Community Resource Referral / Chronic Care Management: CRR required this visit?  No   CCM required this visit?  No      Plan:     I have personally reviewed and noted the following in the patient's chart:   . Medical and social history . Use of alcohol, tobacco or illicit drugs  . Current medications and supplements . Functional ability and status . Nutritional status . Physical activity . Advanced directives . List of other physicians . Hospitalizations, surgeries, and ER visits in previous 12 months . Vitals . Screenings to include cognitive, depression, and falls . Referrals and appointments  In addition, I have reviewed and discussed with patient certain preventive protocols, quality metrics, and best practice recommendations. A written personalized care plan for preventive services as well as general preventive health recommendations were provided to patient.     Ofilia Neas,  LPN   04/27/2923   Nurse Notes: None

## 2021-03-02 ENCOUNTER — Other Ambulatory Visit: Payer: Self-pay | Admitting: Family Medicine

## 2021-03-31 ENCOUNTER — Other Ambulatory Visit: Payer: Self-pay | Admitting: Family Medicine

## 2021-04-27 ENCOUNTER — Other Ambulatory Visit: Payer: Self-pay | Admitting: Family Medicine

## 2021-05-19 ENCOUNTER — Telehealth: Payer: Self-pay | Admitting: Pharmacist

## 2021-05-19 NOTE — Chronic Care Management (AMB) (Signed)
Chronic Care Management Pharmacy Assistant   Name: Michaela Rhodes  MRN: [id number] DOB: 1936/03/25  Reason for Encounter: General Adherence Call       Recent office visits:  . 03.03.2022 [name], LPN Medicare Wellness Exam . 12.08.2021 [name], MD patient seen for follow-up on hypertension, diastolic heart failure, history of DVT, type 2 diabetes.  Recent consult visits:  None  Hospital visits:  None in previous 6 months  Medications: Outpatient Encounter Medications as of 05/19/2021  Medication Sig  . amLODipine (NORVASC) 5 MG tablet Take 1 tablet by mouth once daily  . Blood Glucose Monitoring Suppl (ACCU-CHEK GUIDE) w/Device KIT 1 Device by Other route daily. Use to test blood sugar once daily. Dx Code E11.65  . calcium-vitamin D (OSCAL WITH D) 500-200 MG-UNIT per tablet Take 1 tablet by mouth daily.  Marland Kitchen ELIQUIS 2.5 MG TABS tablet Take 1 tablet by mouth twice daily  . glimepiride (AMARYL) 2 MG tablet Take 1 tablet by mouth once daily  . glucose blood (ACCU-CHEK GUIDE) test strip Use as instructed to test blood sugar once daily. Dx Code E11.65  . Insulin Pen Needle (BD PEN NEEDLE NANO U/F) 32G X 4 MM MISC Use to inject insulin once daily. Dx Code E11.65  . levothyroxine (EUTHYROX) 137 MCG tablet TAKE 1 TABLET BY MOUTH ONCE DAILY BEFORE BREAKFAST  . lisinopril-hydrochlorothiazide (ZESTORETIC) 20-25 MG tablet Take 1 tablet by mouth once daily  . metFORMIN (GLUCOPHAGE-XR) 750 MG 24 hr tablet TAKE 1 TABLET BY MOUTH TWICE DAILY AT  10  IN  THE  MORNING  AND  5  IN  THE  EVENING  . metoprolol tartrate (LOPRESSOR) 50 MG tablet Take 1 tablet by mouth twice daily  . pravastatin (PRAVACHOL) 40 MG tablet Take 1 tablet by mouth once daily  . TOUJEO MAX SOLOSTAR 300 UNIT/ML Solostar Pen INJECT 16 UNITS SUBCUTANEOUSLY ONCE DAILY   No facility-administered encounter medications on file as of 05/19/2021.   Third unsuccessful telephone outreach was attempted today. Left  several messages and no return.  Star Rating Drugs: Medication Dispensed  Quantity Pharmacy  Losartan 25 mg 05.24.2022 90 [facility]  Pravastatin 40 mg 05.10.2022 [facility]     Geraline Halberstadt (Tomma Rakers, [place] Clinical Pharmacist Assistant [phone]

## 2021-06-25 ENCOUNTER — Other Ambulatory Visit: Payer: Self-pay | Admitting: Family Medicine

## 2021-06-30 ENCOUNTER — Other Ambulatory Visit: Payer: Self-pay | Admitting: Family Medicine

## 2021-07-12 ENCOUNTER — Telehealth: Payer: Self-pay | Admitting: Pharmacist

## 2021-07-12 NOTE — Progress Notes (Signed)
    Chronic Care Management Pharmacy Assistant   Name: Michaela Rhodes  MRN: [id number] DOB: [birth year]   Reason for Encounter: Disease State/ Diabetes Assessment Call   Conditions to be addressed/monitored: DMII  Recent office visits:  None  Recent consult visits:  None  Hospital visits:  None in previous 6 months  Medications: Outpatient Encounter Medications as of 07/12/2021  Medication Sig   amLODipine (NORVASC) 5 MG tablet Take 1 tablet by mouth once daily   Blood Glucose Monitoring Suppl (ACCU-CHEK GUIDE) w/Device KIT 1 Device by Other route daily. Use to test blood sugar once daily. Dx Code E11.65   calcium-vitamin D (OSCAL WITH D) 500-200 MG-UNIT per tablet Take 1 tablet by mouth daily.   ELIQUIS 2.5 MG TABS tablet Take 1 tablet by mouth twice daily   glimepiride (AMARYL) 2 MG tablet Take 1 tablet by mouth once daily   glucose blood (ACCU-CHEK GUIDE) test strip Use as instructed to test blood sugar once daily. Dx Code E11.65   Insulin Pen Needle (BD PEN NEEDLE NANO U/F) 32G X 4 MM MISC Use to inject insulin once daily. Dx Code E11.65   levothyroxine (EUTHYROX) 137 MCG tablet TAKE 1 TABLET BY MOUTH ONCE DAILY BEFORE BREAKFAST   lisinopril-hydrochlorothiazide (ZESTORETIC) 20-25 MG tablet Take 1 tablet by mouth once daily   metFORMIN (GLUCOPHAGE-XR) 750 MG 24 hr tablet TAKE 1 TABLET BY MOUTH TWICE DAILY AT  10AM  AND  5PM   metoprolol tartrate (LOPRESSOR) 50 MG tablet Take 1 tablet by mouth twice daily   pravastatin (PRAVACHOL) 40 MG tablet Take 1 tablet by mouth once daily   TOUJEO MAX SOLOSTAR 300 UNIT/ML Solostar Pen INJECT 16 UNITS SUBCUTANEOUSLY ONCE DAILY   No facility-administered encounter medications on file as of 07/12/2021.   Recent Relevant Labs: Lab Results  Component Value Date/Time   HGBA1C 7.4 (H) 11/25/2020 08:40 AM   HGBA1C 7.1 (A) 08/26/2020 08:44 AM   HGBA1C 8.1 (A) 05/26/2020 10:16 AM   HGBA1C 9.3 (H) 03/18/2019 10:33 AM    Kidney Function Lab  Results  Component Value Date/Time   CREATININE 1.24 (H) 11/25/2020 08:40 AM   CREATININE 1.27 (H) 11/25/2019 03:02 PM   CREATININE 1.31 (H) 11/29/2018 05:18 PM   GFR 40.34 (L) 11/25/2019 03:02 PM   GFRNONAA 38 (L) 11/29/2018 05:18 PM   GFRAA 44 (L) 11/29/2018 05:18 PM   Notes: 3rd Attempt on 07/14/2021 to reach patient unsuccessful    Current antihyperglycemic regimen:  glimepiride 20m ,1 tablet once daily Insulin glargine (Toujeo Max Solostar), inject 20 units into skin daily Metformin ER 7565m 1 tablet twice daily at 10AM and 5PM   Fill History: glimepiride (AMARYL) tablet 03/03/2021 90   lisinopril-hydrochlorothiazide (PRINZIDE,ZESTORETIC) tablet 20-25 mg 03/31/2021 90     metFORMIN (GLUCOPHAGE-XR) 24 hr tablet 03/03/2021 90   Care Gaps:  Zoster vaccine - Overdue TDAP - Overdue Eye exam - Overdue COVID Booster #4 (pfizer) - Overdue HgbA1C - Overdue Last AWV was 02/2021 - no future scheduled msg [facility]:  Metformin (Glucophage XR) - Last filled 03-03-2021 90DS at [facility] Lisinopril HCTZ 20-2523m Last filled 03-31-2021 90DS at [facility] Glimepiride 2mg55mLast filled 03-03-2021 90DS at [facility] Pravastatin 40mg14mast [phone]

## 2021-07-17 ENCOUNTER — Other Ambulatory Visit: Payer: Self-pay | Admitting: Family Medicine

## 2021-08-08 ENCOUNTER — Other Ambulatory Visit: Payer: Self-pay | Admitting: Family Medicine

## 2021-08-09 ENCOUNTER — Telehealth: Payer: Self-pay | Admitting: Pharmacist

## 2021-08-09 NOTE — Chronic Care Management (AMB) (Signed)
Chronic Care Management Pharmacy Assistant   Name: Michaela Rhodes  MRN: [id number] DOB: February 24, 1936  Reason for Encounter: Disease State/ Diabetes Assessment Call.    Conditions to be addressed/monitored: DMII   Recent office visits:  None.  Recent consult visits:  None.  Hospital visits:  None in previous 6 months  Medications: Outpatient Encounter Medications as of 08/09/2021  Medication Sig   amLODipine (NORVASC) 5 MG tablet Take 1 tablet by mouth once daily   Blood Glucose Monitoring Suppl (ACCU-CHEK GUIDE) w/Device KIT 1 Device by Other route daily. Use to test blood sugar once daily. Dx Code E11.65   calcium-vitamin D (OSCAL WITH D) 500-200 MG-UNIT per tablet Take 1 tablet by mouth daily.   ELIQUIS 2.5 MG TABS tablet Take 1 tablet by mouth twice daily   glimepiride (AMARYL) 2 MG tablet Take 1 tablet by mouth once daily   glucose blood (ACCU-CHEK GUIDE) test strip Use as instructed to test blood sugar once daily. Dx Code E11.65   Insulin Pen Needle (BD PEN NEEDLE NANO U/F) 32G X 4 MM MISC Use to inject insulin once daily. Dx Code E11.65   levothyroxine (EUTHYROX) 137 MCG tablet TAKE 1 TABLET BY MOUTH ONCE DAILY BEFORE BREAKFAST   lisinopril-hydrochlorothiazide (ZESTORETIC) 20-25 MG tablet Take 1 tablet by mouth once daily   metFORMIN (GLUCOPHAGE-XR) 750 MG 24 hr tablet TAKE 1 TABLET BY MOUTH TWICE DAILY AT  10AM  AND  5PM   metoprolol tartrate (LOPRESSOR) 50 MG tablet Take 1 tablet by mouth twice daily   pravastatin (PRAVACHOL) 40 MG tablet Take 1 tablet by mouth once daily   TOUJEO MAX SOLOSTAR 300 UNIT/ML Solostar Pen INJECT 16 UNITS SUBCUTANEOUSLY ONCE DAILY   No facility-administered encounter medications on file as of 08/09/2021.   Fill History: glimepiride (AMARYL) tablet 03/03/2021 90    lisinopril-hydrochlorothiazide (PRINZIDE,ZESTORETIC) tablet 20-25 mg 03/31/2021 90    ELIQUIS 2.5 MG PO TABS 04/27/2021 90   TOUJEO MAX SOLOSTAR 300 UNIT/ML [place] SOPN  06/30/2021 90   EUTHYROX 137 MCG PO TABS 03/02/2021 90   metoprolol (LOPRESSOR) tablet 04/27/2021 90   pravastatin (PRAVACHOL) tablet 04/27/2021 90   amLODIpine (NORVASC) tablet 03/31/2021 90     metFORMIN (GLUCOPHAGE-XR) 24 hr tablet 03/03/2021 90   Recent Relevant Labs: Lab Results  Component Value Date/Time   HGBA1C 7.4 (H) 11/25/2020 08:40 AM   HGBA1C 7.1 (A) 08/26/2020 08:44 AM   HGBA1C 8.1 (A) 05/26/2020 10:16 AM   HGBA1C 9.3 (H) 03/18/2019 10:33 AM    Kidney Function Lab Results  Component Value Date/Time   CREATININE 1.24 (H) 11/25/2020 08:40 AM   CREATININE 1.27 (H) 11/25/2019 03:02 PM   CREATININE 1.31 (H) 11/29/2018 05:18 PM   GFR 40.34 (L) 11/25/2019 03:02 PM   GFRNONAA 38 (L) 11/29/2018 05:18 PM   GFRAA 44 (L) 11/29/2018 05:18 PM    Current antihyperglycemic regimen:  glimepiride 1m ,1 tablet once daily Insulin glargine (Toujeo Max Solostar), inject 20 units into skin daily Metformin ER 7555m 1 tablet twice daily at 10AM and 5PM What recent interventions/DTPs have been made to improve glycemic control:  None.  Have there been any recent hospitalizations or ED visits since last visit with CPP? No Patient hypoglycemic symptoms, including  Patient hyperglycemic symptoms, including  How often are you checking your blood sugar?  What are your blood sugars ranging?  Fasting:  Before meals:  After meals:  Bedtime:  During the week, how often does your blood glucose drop below 70?  Are you checking your feet daily/regularly?   Adherence Review: Is the patient currently on a STATIN medication? Yes Is the patient currently on ACE/ARB medication? Yes Does the patient have >5 day gap between last estimated fill dates? No  Multiple unsuccessful attempts to reach patient by phone.  Notes: Spoke with Maudie Mercury at [place] and verified last fill dates for patients medications. Patient actually last filled her metformin and glimepiride on 06/28/21 for a 90 day supply.    Care Gaps:  Zoster vaccine - Overdue TDAP - Overdue Eye exam - Overdue COVID Booster #4 (pfizer) - Overdue HgbA1C - Overdue Last AWV was 02/2021 - no future scheduled msg sent to [place] [facility]:  Metformin (Glucophage XR) - Last filled 03-03-2021 90DS at [facility] Lisinopril HCTZ 20-58m - Last filled 03-31-2021 90[facility] at [facility] Glimepiride 28m- Last filled 03-03-2021 90[facility] at [facility] Pravastatin 4050m [facility] ([phone]

## 2021-08-10 NOTE — Telephone Encounter (Cosign Needed)
2nd attempt

## 2021-08-12 NOTE — Telephone Encounter (Cosign Needed)
3rd attempt

## 2021-08-16 ENCOUNTER — Other Ambulatory Visit: Payer: Self-pay | Admitting: Family Medicine

## 2021-08-16 ENCOUNTER — Encounter: Payer: Self-pay | Admitting: Family Medicine

## 2021-08-17 MED ORDER — TOUJEO MAX SOLOSTAR 300 UNIT/ML ~~LOC~~ SOPN: 24.0000 [IU] | PEN_INJECTOR | Freq: Every day | SUBCUTANEOUS | 1 refills | Status: DC

## 2021-08-17 NOTE — Telephone Encounter (Signed)
Please advise ok to change rx?

## 2021-09-14 ENCOUNTER — Telehealth: Payer: Self-pay | Admitting: Pharmacist

## 2021-09-14 NOTE — Chronic Care Management (AMB) (Addendum)
Chronic Care Management Pharmacy Assistant   Name: Michaela Rhodes  MRN: [id number] DOB: November 20, 1936   Reason for Encounter: Disease State / Diabetes Assessment Call   Conditions to be addressed/monitored: DMII    Recent office visits:  None  Recent consult visits:  None  Hospital visits:  None in previous 6 months  Medications: Outpatient Encounter Medications as of 09/14/2021  Medication Sig   amLODipine (NORVASC) 5 MG tablet Take 1 tablet by mouth once daily   Blood Glucose Monitoring Suppl (ACCU-CHEK GUIDE) w/Device KIT 1 Device by Other route daily. Use to test blood sugar once daily. Dx Code E11.65   calcium-vitamin D (OSCAL WITH D) 500-200 MG-UNIT per tablet Take 1 tablet by mouth daily.   ELIQUIS 2.5 MG TABS tablet Take 1 tablet by mouth twice daily   glimepiride (AMARYL) 2 MG tablet Take 1 tablet by mouth once daily   glucose blood (ACCU-CHEK GUIDE) test strip Use as instructed to test blood sugar once daily. Dx Code E11.65   insulin glargine, 2 Unit Dial, (TOUJEO MAX SOLOSTAR) 300 UNIT/ML Solostar Pen Inject 24 Units into the skin daily.   Insulin Pen Needle (BD PEN NEEDLE NANO U/F) 32G X 4 MM MISC Use to inject insulin once daily. Dx Code E11.65   levothyroxine (EUTHYROX) 137 MCG tablet TAKE 1 TABLET BY MOUTH ONCE DAILY BEFORE BREAKFAST   lisinopril-hydrochlorothiazide (ZESTORETIC) 20-25 MG tablet Take 1 tablet by mouth once daily   metFORMIN (GLUCOPHAGE-XR) 750 MG 24 hr tablet TAKE 1 TABLET BY MOUTH TWICE DAILY AT  10AM  AND  5PM   metoprolol tartrate (LOPRESSOR) 50 MG tablet Take 1 tablet by mouth twice daily   pravastatin (PRAVACHOL) 40 MG tablet Take 1 tablet by mouth once daily   No facility-administered encounter medications on file as of 09/14/2021.   Fill History:  ELIQUIS 2.5 MG PO TABS 07/27/2021 90   glimepiride (AMARYL) tablet 06/28/2021 90   TOUJEO MAX SOLOSTAR 300 UNIT/ML [place] SOPN 06/30/2021 90   EUTHYROX 137 MCG PO TABS 03/02/2021 90    lisinopril-hydrochlorothiazide (PRINZIDE,ZESTORETIC) tablet 20-25 mg 07/19/2021 90   metoprolol (LOPRESSOR) tablet 07/27/2021 90   pravastatin (PRAVACHOL) tablet 04/27/2021 90   amLODIpine (NORVASC) tablet 07/19/2021 90   metFORMIN (GLUCOPHAGE-XR) 24 hr tablet 06/28/2021 90   Recent Relevant Labs: Lab Results  Component Value Date/Time   HGBA1C 7.4 (H) 11/25/2020 08:40 AM   HGBA1C 7.1 (A) 08/26/2020 08:44 AM   HGBA1C 8.1 (A) 05/26/2020 10:16 AM   HGBA1C 9.3 (H) 03/18/2019 10:33 AM    Kidney Function Lab Results  Component Value Date/Time   CREATININE 1.24 (H) 11/25/2020 08:40 AM   CREATININE 1.27 (H) 11/25/2019 03:02 PM   CREATININE 1.31 (H) 11/29/2018 05:18 PM   GFR 40.34 (L) 11/25/2019 03:02 PM   GFRNONAA 38 (L) 11/29/2018 05:18 PM   GFRAA 44 (L) 11/29/2018 05:18 PM    Current antihyperglycemic regimen:  Glimepiride 2 mg 1 tablet dail Toujeo Max Solostar inject 24 units daily Metformin ER 750 mg 1 tablet twice daily  What recent interventions/DTPs have been made to improve glycemic control:  None Have there been any recent hospitalizations or ED visits since last visit with CPP? No  Patient denies hypoglycemic symptoms, including None  Patient denies hyperglycemic symptoms, including none  How often are you checking your blood sugar? once daily  What are your blood sugars ranging?  Fasting: average readings prior to breakfast are in the 120's if she eats out the night prior  her readings have been around 140  During the week, how often does your blood glucose drop below 70? Never  Are you checking your feet daily/regularly? yes  Adherence Review: Is the patient currently on a STATIN medication? Yes Is the patient currently on ACE/ARB medication? Yes Does the patient have >5 day gap between last estimated fill dates? No  Notes:  Patient states she has made changes in her diet, she is eating a small bagel every other day otherwise no breads, cakes, cookies  or candies. Patient will eat some fruits that have a lower sugar count, otherwise she eats vegetables and meats, her meats are chicken and ground beef.   Care Gaps:  Zoster vaccine - Overdue TDAP - Overdue Eye exam - Overdue COVID Booster #4 (pfizer) - Overdue HgbA1C - Overdue AWV was completed 02/18/2021  Star Rating Drugs:  Metformin (Glucophage XR) - Last [facility]/28/2021 90[facility] verified with Joe Lisinopril HCTZ 20-69m - [facility] Gl[facility] 271m- [facility] Pravastatin 4017m [facility] [phone]

## 2021-10-25 ENCOUNTER — Other Ambulatory Visit: Payer: Self-pay | Admitting: Family Medicine

## 2021-10-25 ENCOUNTER — Telehealth: Payer: Self-pay | Admitting: Pharmacist

## 2021-10-25 NOTE — Chronic Care Management (AMB) (Signed)
    Chronic Care Management Pharmacy Assistant   Name: Michaela Rhodes  MRN: [id number] DOB: 1936/05/03  Reason for Encounter: Disease State / Diabetes assessment call   Conditions to be addressed/monitored: DMII   Recent office visits:  None   Recent consult visits:  None  Hospital visits:  None  Medications: Outpatient Encounter Medications as of 10/25/2021  Medication Sig   amLODipine (NORVASC) 5 MG tablet Take 1 tablet by mouth once daily   Blood Glucose Monitoring Suppl (ACCU-CHEK GUIDE) w/Device KIT 1 Device by Other route daily. Use to test blood sugar once daily. Dx Code E11.65   calcium-vitamin D (OSCAL WITH D) 500-200 MG-UNIT per tablet Take 1 tablet by mouth daily.   ELIQUIS 2.5 MG TABS tablet Take 1 tablet by mouth twice daily   glimepiride (AMARYL) 2 MG tablet Take 1 tablet by mouth once daily   glucose blood (ACCU-CHEK GUIDE) test strip Use as instructed to test blood sugar once daily. Dx Code E11.65   insulin glargine, 2 Unit Dial, (TOUJEO MAX SOLOSTAR) 300 UNIT/ML Solostar Pen Inject 24 Units into the skin daily.   Insulin Pen Needle (BD PEN NEEDLE NANO U/F) 32G X 4 MM MISC Use to inject insulin once daily. Dx Code E11.65   levothyroxine (EUTHYROX) 137 MCG tablet TAKE 1 TABLET BY MOUTH ONCE DAILY BEFORE BREAKFAST   lisinopril-hydrochlorothiazide (ZESTORETIC) 20-25 MG tablet Take 1 tablet by mouth once daily   metFORMIN (GLUCOPHAGE-XR) 750 MG 24 hr tablet TAKE 1 TABLET BY MOUTH TWICE DAILY AT  10  AM  AND  5  PM   metoprolol tartrate (LOPRESSOR) 50 MG tablet Take 1 tablet by mouth twice daily   pravastatin (PRAVACHOL) 40 MG tablet Take 1 tablet by mouth once daily   No facility-administered encounter medications on file as of 10/25/2021.   Fill History: glimepiride (AMARYL) tablet 06/28/2021 90   ELIQUIS 2.5 MG PO TABS 07/27/2021 90   TOUJEO MAX SOLOSTAR 300 UNIT/ML [place] SOPN 08/27/2021 75   EUTHYROX 137 MCG PO TABS 08/08/2021 90    lisinopril-hydrochlorothiazide (PRINZIDE,ZESTORETIC) tablet 20-25 mg 07/19/2021 90   metoprolol (LOPRESSOR) tablet 07/27/2021 90   pravastatin (PRAVACHOL) tablet 08/09/2021 90   amLODIpine (NORVASC) tablet 07/19/2021 90   metFORMIN (GLUCOPHAGE-XR) 24 hr tablet 06/28/2021 90   Recent Relevant Labs: Lab Results  Component Value Date/Time   HGBA1C 7.4 (H) 11/25/2020 08:40 AM   HGBA1C 7.1 (A) 08/26/2020 08:44 AM   HGBA1C 8.1 (A) 05/26/2020 10:16 AM   HGBA1C 9.3 (H) 03/18/2019 10:33 AM    Kidney Function   Current antihyperglycemic regimen:  Glimepiride 2 mg 1 tablet dail Toujeo Max Solostar inject 24 units daily Metformin ER 750 mg 1 tablet twice daily  Adherence Review: Is the patient currently on a STATIN medication? Yes Is the patient currently on ACE/ARB medication? Yes Does the patient have >5 day gap between last estimated fill dates? No  Unable to reach patient after several attempts.   Care Gaps: Zoster vaccine - Overdue TDAP - Overdue Eye exam - Overdue COVID Booster #4 (pfizer) - Overdue HgbA1C - Overdue AWV was completed 02/18/2021  Star Rating Drugs: Metformin (Glucophage XR) - Last filled 10/25/2021 90DS at [facility]  Lisinopril HCTZ 20-25mg  - Last filled 11/07/202290 DS at [facility]  Glimepiride 2mg  - Last filled 10/25/2021 90DS at [facility] Pravastatin 40mg  - [facility] verified with Alwyn Ren at [place] Pharmacist Assistant [phone]

## 2021-11-30 ENCOUNTER — Other Ambulatory Visit: Payer: Self-pay | Admitting: Family Medicine

## 2022-02-08 ENCOUNTER — Encounter: Payer: Self-pay | Admitting: Family Medicine

## 2022-02-08 ENCOUNTER — Ambulatory Visit (INDEPENDENT_AMBULATORY_CARE_PROVIDER_SITE_OTHER): Payer: Medicare HMO | Admitting: Family Medicine

## 2022-02-08 ENCOUNTER — Telehealth: Payer: Self-pay | Admitting: Pharmacist

## 2022-02-08 VITALS — BP 144/98 | HR 95 | Temp 98.1°F | Ht 62.5 in | Wt 229.9 lb

## 2022-02-08 DIAGNOSIS — E039 Hypothyroidism, unspecified: Secondary | ICD-10-CM | POA: Diagnosis not present

## 2022-02-08 DIAGNOSIS — E118 Type 2 diabetes mellitus with unspecified complications: Secondary | ICD-10-CM | POA: Diagnosis not present

## 2022-02-08 DIAGNOSIS — Z79899 Other long term (current) drug therapy: Secondary | ICD-10-CM | POA: Diagnosis not present

## 2022-02-08 DIAGNOSIS — I110 Hypertensive heart disease with heart failure: Secondary | ICD-10-CM | POA: Diagnosis not present

## 2022-02-08 DIAGNOSIS — I509 Heart failure, unspecified: Secondary | ICD-10-CM | POA: Diagnosis not present

## 2022-02-08 DIAGNOSIS — I10 Essential (primary) hypertension: Secondary | ICD-10-CM | POA: Diagnosis not present

## 2022-02-08 DIAGNOSIS — E785 Hyperlipidemia, unspecified: Secondary | ICD-10-CM

## 2022-02-08 DIAGNOSIS — Z6841 Body Mass Index (BMI) 40.0 and over, adult: Secondary | ICD-10-CM | POA: Diagnosis not present

## 2022-02-08 LAB — LDL CHOLESTEROL, DIRECT: Direct LDL: 168 mg/dL

## 2022-02-08 LAB — POCT GLYCOSYLATED HEMOGLOBIN (HGB A1C): Hemoglobin A1C: 7.6 % — AB (ref 4.0–5.6)

## 2022-02-08 LAB — LIPID PANEL
Cholesterol: 271 mg/dL — ABNORMAL HIGH (ref 0–200)
HDL: 39 mg/dL — ABNORMAL LOW (ref >39.00)
NonHDL: 232.39
Total CHOL/HDL Ratio: 7
Triglycerides: 293 mg/dL — ABNORMAL HIGH (ref 0.0–149.0)
VLDL: 58.6 mg/dL — ABNORMAL HIGH (ref 0.0–40.0)

## 2022-02-08 LAB — CBC WITH DIFFERENTIAL/PLATELET
Basophils Absolute: 0.1 10*3/uL (ref 0.0–0.1)
Basophils Relative: 0.9 % (ref 0.0–3.0)
Eosinophils Absolute: 0.4 10*3/uL (ref 0.0–0.7)
Eosinophils Relative: 5 % (ref 0.0–5.0)
HCT: 41.2 % (ref 36.0–46.0)
Hemoglobin: 13.2 g/dL (ref 12.0–15.0)
Lymphocytes Relative: 18.2 % (ref 12.0–46.0)
Lymphs Abs: 1.5 10*3/uL (ref 0.7–4.0)
MCHC: 32.1 g/dL (ref 30.0–36.0)
MCV: 84.6 fl (ref 78.0–100.0)
Monocytes Absolute: 0.6 10*3/uL (ref 0.1–1.0)
Monocytes Relative: 6.9 % (ref 3.0–12.0)
Neutro Abs: 5.6 10*3/uL (ref 1.4–7.7)
Neutrophils Relative %: 69 % (ref 43.0–77.0)
Platelets: 279 10*3/uL (ref 150.0–400.0)
RBC: 4.87 Mil/uL (ref 3.87–5.11)
RDW: 13.7 % (ref 11.5–15.5)
WBC: 8.1 10*3/uL (ref 4.0–10.5)

## 2022-02-08 LAB — COMPREHENSIVE METABOLIC PANEL
ALT: 13 U/L (ref 0–35)
AST: 15 U/L (ref 0–37)
Albumin: 4.1 g/dL (ref 3.5–5.2)
Alkaline Phosphatase: 115 U/L (ref 39–117)
BUN: 37 mg/dL — ABNORMAL HIGH (ref 6–23)
CO2: 28 mEq/L (ref 19–32)
Calcium: 9.5 mg/dL (ref 8.4–10.5)
Chloride: 101 mEq/L (ref 96–112)
Creatinine, Ser: 1.5 mg/dL — ABNORMAL HIGH (ref 0.40–1.20)
GFR: 31.99 mL/min — ABNORMAL LOW (ref >60.00)
Glucose, Bld: 225 mg/dL — ABNORMAL HIGH (ref 70–99)
Potassium: 4.6 mEq/L (ref 3.5–5.1)
Sodium: 137 mEq/L (ref 135–145)
Total Bilirubin: 0.4 mg/dL (ref 0.2–1.2)
Total Protein: 6.9 g/dL (ref 6.0–8.3)

## 2022-02-08 LAB — TSH: TSH: 5.74 u[IU]/mL — ABNORMAL HIGH (ref 0.35–5.50)

## 2022-02-08 MED ORDER — AMLODIPINE BESYLATE 5 MG PO TABS: 5.0000 mg | ORAL_TABLET | Freq: Every day | ORAL | 3 refills | Status: DC

## 2022-02-08 MED ORDER — METOPROLOL TARTRATE 50 MG PO TABS: 50.0000 mg | ORAL_TABLET | Freq: Two times a day (BID) | ORAL | 3 refills | Status: DC

## 2022-02-08 MED ORDER — GLIPIZIDE 5 MG PO TABS
2.5000 mg | ORAL_TABLET | Freq: Every day | ORAL | 3 refills | Status: DC
Start: 2022-02-08 — End: 2023-02-01

## 2022-02-08 MED ORDER — APIXABAN 2.5 MG PO TABS: 2.5000 mg | ORAL_TABLET | Freq: Two times a day (BID) | ORAL | 3 refills | Status: DC

## 2022-02-08 MED ORDER — LEVOTHYROXINE SODIUM 137 MCG PO TABS: 137.0000 ug | ORAL_TABLET | Freq: Every day | ORAL | 3 refills | Status: DC

## 2022-02-08 MED ORDER — PRAVASTATIN SODIUM 40 MG PO TABS: 40.0000 mg | ORAL_TABLET | Freq: Every day | ORAL | 3 refills | Status: DC

## 2022-02-08 MED ORDER — METFORMIN HCL ER 750 MG PO TB24: ORAL_TABLET | ORAL | 0 refills | Status: DC

## 2022-02-08 MED ORDER — LISINOPRIL-HYDROCHLOROTHIAZIDE 20-25 MG PO TABS: 1.0000 | ORAL_TABLET | Freq: Every day | ORAL | 3 refills | Status: DC

## 2022-02-08 NOTE — Chronic Care Management (AMB) (Signed)
Chronic Care Management Pharmacy Assistant   Name: Michaela Rhodes  MRN: [id number] DOB: [birth year]  Reason for Encounter: Disease State / Hypertension Assessment Call   Conditions to be addressed/monitored: HTN  Recent office visits:  None  Recent consult visits:  None  Hospital visits:  None  Medications: Outpatient Encounter Medications as of 02/08/2022  Medication Sig   amLODipine (NORVASC) 5 MG tablet Take 1 tablet (5 mg total) by mouth daily.   apixaban (ELIQUIS) 2.5 MG TABS tablet Take 1 tablet (2.5 mg total) by mouth 2 (two) times daily.   Blood Glucose Monitoring Suppl (ACCU-CHEK GUIDE) w/Device KIT 1 Device by Other route daily. Use to test blood sugar once daily. Dx Code E11.65   calcium-vitamin D (OSCAL WITH D) 500-200 MG-UNIT per tablet Take 1 tablet by mouth daily.   glipiZIDE (GLUCOTROL) 5 MG tablet Take 0.5 tablets (2.5 mg total) by mouth daily before breakfast.   glucose blood (ACCU-CHEK GUIDE) test strip Use as instructed to test blood sugar once daily. Dx Code E11.65   insulin glargine, 2 Unit Dial, (TOUJEO MAX SOLOSTAR) 300 UNIT/ML Solostar Pen Inject 24 Units into the skin daily.   Insulin Pen Needle (BD PEN NEEDLE NANO U/F) 32G X 4 MM MISC Use to inject insulin once daily. Dx Code E11.65   levothyroxine (SYNTHROID) 137 MCG tablet Take 1 tablet (137 mcg total) by mouth daily before breakfast.   lisinopril-hydrochlorothiazide (ZESTORETIC) 20-25 MG tablet Take 1 tablet by mouth daily.   metFORMIN (GLUCOPHAGE-XR) 750 MG 24 hr tablet TAKE 1 TABLET BY MOUTH TWICE DAILY AT  10  AM  AND  5  PM   metoprolol tartrate (LOPRESSOR) 50 MG tablet Take 1 tablet (50 mg total) by mouth 2 (two) times daily.   pravastatin (PRAVACHOL) 40 MG tablet Take 1 tablet (40 mg total) by mouth daily.   No facility-administered encounter medications on file as of 02/08/2022.  Fill History: METFORMIN ER 750MG  TAB 10/26/2021 90   AMLODIPINE 5MG TAB 10/25/2021 90   PRAVASTATIN 40MG     TAB 11/30/2021 30   METOPROLOL TART 50MG TAB 10/25/2021 90   LISINOPRIL/HCTZ 20-25MG TAB 10/25/2021 90   LEVOTHYROXIN 137MCG TAB 11/30/2021 30   TOUJEO MAX SOLO 300IU/ML INJ 11/22/2021 75   GLIMEPIRIDE 2MG TAB 10/25/2021 90   ELIQUIS 2.5MG       TAB 10/25/2021 90   Reviewed chart prior to disease state call. Spoke with patient regarding BP  Recent Office Vitals: BP Readings from Last 3 Encounters:  02/08/22 (!) 144/98  11/25/20 124/80  08/26/20 (!) 130/92   Pulse Readings from Last 3 Encounters:  02/08/22 95  08/26/20 (!) 101  05/26/20 (!) 115    Wt Readings from Last 3 Encounters:  02/08/22 229 lb 14.4 oz (104.3 kg)  11/25/20 226 lb (102.5 kg)  08/26/20 224 lb (101.6 kg)     Kidney Function Lab Results  Component Value Date/Time   CREATININE 1.24 (H) 11/25/2020 08:40 AM   CREATININE 1.27 (H) 11/25/2019 03:02 PM   CREATININE 1.31 (H) 11/29/2018 05:18 PM   GFR 40.34 (L) 11/25/2019 03:02 PM   GFRNONAA 38 (L) 11/29/2018 05:18 PM   GFRAA 44 (L) 11/29/2018 05:18 PM    BMP Latest Ref Rng & Units 11/25/2020 11/25/2019 11/29/2018  Glucose 65 - 99 mg/dL 124(H) 200(H) 383(H)  BUN 7 - 25 mg/dL 25 35(H) 23  Creatinine 0.60 - 0.88 mg/dL 1.24(H) 1.27(H) 1.31(H)  BUN/Creat Ratio 6 - 22 (calc) 20 - -  Sodium 135 - 146 mmol/L 143 139 140  Potassium 3.5 - 5.3 mmol/L 4.7 4.7 4.3  Chloride 98 - 110 mmol/L 106 103 102  CO2 20 - 32 mmol/L '27 26 24  ' Calcium 8.6 - 10.4 mg/dL 9.6 9.8 9.4   Current antihypertensive regimen:  Amlodipine 5 mg - take 1 tablet daily Zestoretic 20/25 - take 1 tablet daily Metoprolol 50 mg - take 1 tablet two times daily  How often are you checking your Blood Pressure?   Current home BP readings:   What recent interventions/DTPs have been made by any provider to improve Blood Pressure control since last CPP Visit: Patient is to set up an appointment in 1 month from 02/08/22 visit and bring her blood pressure cuff for comparison.   Any recent  hospitalizations or ED visits since last visit with CPP? No recent hospital visits.   What diet changes have been made to improve Blood Pressure Control?    What exercise is being done to improve your Blood Pressure Control?    Adherence Review: Is the patient currently on ACE/ARB medication? Yes Does the patient have >5 day gap between last estimated fill dates?   Unable to reach patient after several attempts  Care Gaps: AWV - completed 02/18/2021 Last BP - 144/98 on 02/08/2022 Last A1C - 7.6 02/08/2022 Foot exam - overdue Flu vaccine - overdue   Star Rating Drugs: Metformin (Glucophage XR) - Last filled 10/26/2021 90DS at [facility]  Lisinopril HCTZ 20-42m - Last filled 11/07/202290 DS at [facility]  Pravastatin 411m- Last filled 11/30/2021 90DS at [facility]  Glipizide 2.5 mg - started at today's visit with Dr. BuAlcide EvenerMA  Clinical Pharmacist Assistant [phone]

## 2022-02-08 NOTE — Patient Instructions (Signed)
Stop the Glimepiride  Start Glipizide one half tablet once daily with breakfast  Set up one month follow up to recheck blood pressure and bring in your cuff.

## 2022-02-08 NOTE — Progress Notes (Signed)
Established Patient Office Visit  Subjective:  Patient ID: Michaela Rhodes, female    DOB: [birth year]  Age: 86 y.o. MRN: [id number]  CC:  Chief Complaint  Patient presents with   Follow-up    HPI Michaela Rhodes presents for medical follow-up.  Her chronic problems include history of hypertension, diastolic heart failure, history of recurrent DVT, type 2 diabetes, hypothyroidism, osteoarthritis involving multiple joints, hypothyroidism, hyperlipidemia  Medications reviewed.  She does not check her blood sugars regularly.  Last A1c was 7.4%.  She is currently on Toujeo 24 units daily and also takes metformin and glimepiride.  No hypoglycemia.  She states her fasting blood sugars are fairly consistently between 100 and 130.  Not monitoring blood pressure regularly.  She has some fatigue generally but no headaches.  No chest pains.  Past Medical History:  Diagnosis Date   Cancer [facility])    breast cancer   DM2 (diabetes mellitus, type 2) ([place])    Hyperlipidemia    Hypertension    Hypothyroidism    Panic attacks    mild   SVT (supraventricular tachycardia) ([place])    in the past   Wears glasses     Past Surgical History:  Procedure Laterality Date   ABDOMINAL HYSTERECTOMY     BSO as well   APPENDECTOMY     APPLICATION OF A-CELL OF EXTREMITY Left 01/15/2015   Procedure: APPLICATION OF A-CELL OF EXTREMITY;  Surgeon: Theodoro Kos, DO;  Location: [place];  Service: Plastics;  Laterality: Left;   CHOLECYSTECTOMY     I & D EXTREMITY Left 01/15/2015   Procedure: IRRIGATION AND DEBRIDEMENT EXTREMITY;  Surgeon: Theodoro Kos, DO;  Location: [place];  Service: Plastics;  Laterality: Left;   IR KYPHO EA ADDL LEVEL THORACIC OR LUMBAR  05/10/2017   IR KYPHO THORACIC WITH BONE BIOPSY  05/10/2017   IR RADIOLOGIST EVAL & MGMT  04/28/2017   MASTECTOMY  1985   Left   RECONSTRUCTION BREAST W/ LATISSIMUS DORSI FLAP     TOTAL SHOULDER ARTHROPLASTY  05/27/2012    Procedure: TOTAL SHOULDER ARTHROPLASTY;  Surgeon: Johnny Bridge, MD;  Location: [place];  Service: Orthopedics;  Laterality: Right;    Family History  Problem Relation Age of Onset   Heart disease Mother    Hypertension Mother    Stroke Father    Hypertension Father    Coronary artery disease Other        family hx of   Diabetes Other        family hx of   Hypertension Other        family hx of   Hypertension Sister     Social History   Socioeconomic History   Marital status: Widowed    Spouse name: Not on file   Number of children: Not on file   Years of education: Not on file   Highest education level: Not on file  Occupational History   Occupation: retired    Comment: had [employer] business  Tobacco Use   Smoking status: Never   Smokeless tobacco: Never  Vaping Use   Vaping Use: Never used  Substance and Sexual Activity   Alcohol use: No   Drug use: No   Sexual activity: Never  Other Topics Concern   Not on file  Social History Narrative   Not on file   Social Determinants of Health   Financial Resource Strain: Low Risk    Difficulty of Paying Living  Expenses: Not hard at all  Food Insecurity: No Food Insecurity   Worried About Charity fundraiser in the Last Year: Never true   Ran Out of Food in the Last Year: Never true  Transportation Needs: No Transportation Needs   Lack of Transportation (Medical): No   Lack of Transportation (Non-Medical): No  Physical Activity: Inactive   Days of Exercise per Week: 0 days   Minutes of Exercise per Session: 0 min  Stress: No Stress Concern Present   Feeling of Stress : Not at all  Social Connections: Moderately Integrated   Frequency of Communication with Friends and Family: Three times a week   Frequency of Social Gatherings with Friends and Family: More than three times a week   Attends Religious Services: More than 4 times per year   Active Member of Clubs or Organizations: Yes   Attends Archivist  Meetings: More than 4 times per year   Marital Status: Widowed  Human resources officer Violence: Not At Risk   Fear of Current or Ex-Partner: No   Emotionally Abused: No   Physically Abused: No   Sexually Abused: No    Outpatient Medications Prior to Visit  Medication Sig Dispense Refill   amLODipine (NORVASC) 5 MG tablet Take 1 tablet by mouth once daily 90 tablet 0   Blood Glucose Monitoring Suppl (ACCU-CHEK GUIDE) w/Device KIT 1 Device by Other route daily. Use to test blood sugar once daily. Dx Code E11.65 1 kit 1   calcium-vitamin D (OSCAL WITH D) 500-200 MG-UNIT per tablet Take 1 tablet by mouth daily. 100 tablet 2   ELIQUIS 2.5 MG TABS tablet Take 1 tablet by mouth twice daily 180 tablet 0   glucose blood (ACCU-CHEK GUIDE) test strip Use as instructed to test blood sugar once daily. Dx Code E11.65 100 each 3   insulin glargine, 2 Unit Dial, (TOUJEO MAX SOLOSTAR) 300 UNIT/ML Solostar Pen Inject 24 Units into the skin daily. 9 mL 1   Insulin Pen Needle (BD PEN NEEDLE NANO U/F) 32G X 4 MM MISC Use to inject insulin once daily. Dx Code E11.65 100 each 1   levothyroxine (SYNTHROID) 137 MCG tablet TAKE 1 TABLET BY MOUTH ONCE DAILY BEFORE BREAKFAST 30 tablet 0   lisinopril-hydrochlorothiazide (ZESTORETIC) 20-25 MG tablet Take 1 tablet by mouth once daily 90 tablet 0   metFORMIN (GLUCOPHAGE-XR) 750 MG 24 hr tablet TAKE 1 TABLET BY MOUTH TWICE DAILY AT  10  AM  AND  5  PM 180 tablet 0   metoprolol tartrate (LOPRESSOR) 50 MG tablet Take 1 tablet by mouth twice daily 180 tablet 0   pravastatin (PRAVACHOL) 40 MG tablet Take 1 tablet by mouth once daily 30 tablet 0   glimepiride (AMARYL) 2 MG tablet Take 1 tablet by mouth once daily 90 tablet 0   No facility-administered medications prior to visit.    Allergies  Allergen Reactions   Penicillins Anaphylaxis and Hives    Has patient had a PCN reaction causing immediate rash, facial/tongue/throat swelling, SOB or lightheadedness with hypotension:  Yes Has patient had a PCN reaction causing severe rash involving mucus membranes or skin necrosis: Yes Has patient had a PCN reaction that required hospitalization: No Has patient had a PCN reaction occurring within the last 10 years: Yes If all of the above answers are "NO", then may proceed with Cephalosporin use.    Keflex [Cephalexin]     Rash     ROS Review of Systems  Constitutional:  Positive for fatigue. Negative for chills and unexpected weight change.  Eyes:  Negative for visual disturbance.  Respiratory:  Negative for cough, chest tightness, shortness of breath and wheezing.   Cardiovascular:  Negative for chest pain, palpitations and leg swelling.  Endocrine: Negative for polydipsia and polyuria.  Genitourinary:  Negative for dysuria.  Musculoskeletal:  Positive for arthralgias.  Neurological:  Negative for dizziness, seizures, syncope, weakness, light-headedness and headaches.     Objective:    Physical Exam Vitals reviewed.  Constitutional:      Appearance: She is obese.  Cardiovascular:     Rate and Rhythm: Normal rate.  Pulmonary:     Effort: Pulmonary effort is normal.     Breath sounds: Normal breath sounds.  Musculoskeletal:     Right lower leg: No edema.     Left lower leg: No edema.  Neurological:     Mental Status: She is alert.    BP (!) 144/98 (BP Location: Left Arm, Patient Position: Sitting, Cuff Size: Large)    Pulse 95    Temp 98.1 F (36.7 C) (Oral)    Ht 5' 2.5" (1.588 m)    Wt 229 lb 14.4 oz (104.3 kg)    SpO2 97%    BMI 41.38 kg/m  Wt Readings from Last 3 Encounters:  02/08/22 229 lb 14.4 oz (104.3 kg)  11/25/20 226 lb (102.5 kg)  08/26/20 224 lb (101.6 kg)     Health Maintenance Due  Topic Date Due   INFLUENZA VACCINE  07/19/2021   FOOT EXAM  08/26/2021    There are no preventive care reminders to display for this patient.  Lab Results  Component Value Date   TSH 5.71 (H) 11/25/2020   Lab Results  Component Value Date    WBC 9.6 11/25/2020   HGB 13.4 11/25/2020   HCT 41.3 11/25/2020   MCV 84.6 11/25/2020   PLT 335 11/25/2020   Lab Results  Component Value Date   NA 143 11/25/2020   K 4.7 11/25/2020   CO2 27 11/25/2020   GLUCOSE 124 (H) 11/25/2020   BUN 25 11/25/2020   CREATININE 1.24 (H) 11/25/2020   BILITOT 0.5 11/25/2020   ALKPHOS 133 (H) 11/25/2019   AST 15 11/25/2020   ALT 15 11/25/2020   PROT 6.6 11/25/2020   ALBUMIN 4.2 11/25/2019   CALCIUM 9.6 11/25/2020   ANIONGAP 14 11/29/2018   GFR 40.34 (L) 11/25/2019   Lab Results  Component Value Date   CHOL 176 11/25/2020   Lab Results  Component Value Date   HDL 46 (L) 11/25/2020   Lab Results  Component Value Date   LDLCALC 93 11/25/2020   Lab Results  Component Value Date   TRIG 244 (H) 11/25/2020   Lab Results  Component Value Date   CHOLHDL 3.8 11/25/2020   Lab Results  Component Value Date   HGBA1C 7.6 (A) 02/08/2022      Assessment & Plan:   #1 type 2 diabetes.  A1c today 7.6%.  We discussed options of pushing to get her less than 7 versus goal of 7-8.  We did discuss the fact that she is on omeprazole I to because of its longer half-life somewhat high risk of hypoglycemia.  We recommended switching this to glipizide 2.5 mg daily.  Consider repeat A1c in 3 months  #2 hypertension suboptimal control.  She does have some home readings recently that have been much better.  Recommend set up 1 month follow-up and bring her cuff in  to compare with ours at that time.  #3 hypothyroidism.  Due for follow-up labs.  Recheck TSH today  #4 hyperlipidemia.  Patient on statin.  Recheck lipids and CMP  #5 history of recurrent DVT.  Patient on low-dose Eliquis.  Check CBC and CMP   Meds ordered this encounter  Medications   glipiZIDE (GLUCOTROL) 5 MG tablet    Sig: Take 0.5 tablets (2.5 mg total) by mouth daily before breakfast.    Dispense:  45 tablet    Refill:  3    Follow-up: Return in about 1 month (around 03/08/2022).     Carolann Littler, MD

## 2022-02-09 MED ORDER — TOUJEO MAX SOLOSTAR 300 UNIT/ML ~~LOC~~ SOPN: 24.0000 [IU] | PEN_INJECTOR | Freq: Every day | SUBCUTANEOUS | 1 refills | Status: DC

## 2022-02-09 NOTE — Addendum Note (Signed)
Addended by: Nilda Riggs on: 02/09/2022 09:55 AM   Modules accepted: Orders

## 2022-04-06 ENCOUNTER — Telehealth: Payer: Self-pay | Admitting: Pharmacist

## 2022-04-06 NOTE — Chronic Care Management (AMB) (Signed)
? ? ?Chronic Care Management ?Pharmacy Assistant  ? ?Name: Michaela Rhodes  MRN: [id number] DOB: [birth year] ? ?Reason for Encounter: Disease State  Hypertension Assessment Call ?  ?Conditions to be addressed/monitored: ?HTN ? ?Recent office visits:  ?None ? ?Recent consult visits:  ?None ? ?Hospital visits:  ?None ? ?Medications: ?Outpatient Encounter Medications as of 04/06/2022  ?Medication Sig  ? amLODipine (NORVASC) 5 MG tablet Take 1 tablet (5 mg total) by mouth daily.  ? apixaban (ELIQUIS) 2.5 MG TABS tablet Take 1 tablet (2.5 mg total) by mouth 2 (two) times daily.  ? Blood Glucose Monitoring Suppl (ACCU-CHEK GUIDE) w/Device KIT 1 Device by Other route daily. Use to test blood sugar once daily. Dx Code E11.65  ? calcium-vitamin D (OSCAL WITH D) 500-200 MG-UNIT per tablet Take 1 tablet by mouth daily.  ? glipiZIDE (GLUCOTROL) 5 MG tablet Take 0.5 tablets (2.5 mg total) by mouth daily before breakfast.  ? glucose blood (ACCU-CHEK GUIDE) test strip Use as instructed to test blood sugar once daily. Dx Code E11.65  ? insulin glargine, 2 Unit Dial, (TOUJEO MAX SOLOSTAR) 300 UNIT/ML Solostar Pen Inject 24 Units into the skin daily.  ? Insulin Pen Needle (BD PEN NEEDLE NANO U/F) 32G X 4 MM MISC Use to inject insulin once daily. Dx Code E11.65  ? levothyroxine (SYNTHROID) 137 MCG tablet Take 1 tablet (137 mcg total) by mouth daily before breakfast.  ? lisinopril-hydrochlorothiazide (ZESTORETIC) 20-25 MG tablet Take 1 tablet by mouth daily.  ? metFORMIN (GLUCOPHAGE-XR) 750 MG 24 hr tablet TAKE 1 TABLET BY MOUTH TWICE DAILY AT  10  AM  AND  5  PM  ? metoprolol tartrate (LOPRESSOR) 50 MG tablet Take 1 tablet (50 mg total) by mouth 2 (two) times daily.  ? pravastatin (PRAVACHOL) 40 MG tablet Take 1 tablet (40 mg total) by mouth daily.  ? ?No facility-administered encounter medications on file as of 04/06/2022.  ?Fill History: ?ELIQUIS 2.5MG       TAB 02/08/2022 90  ? ?TOUJEO MAX SOLO 300IU/ML INJ 02/09/2022 75   ? ?LEVOTHYROXIN 137MCG TAB 02/09/2022 90  ? ?LISINOPRIL/HCTZ 20-25MG TAB 02/08/2022 90  ? ?METOPROLOL TART 50MG TAB 02/08/2022 90  ? ?PRAVASTATIN 40MG    TAB 02/08/2022 90  ? ?AMLODIPINE 5MG TAB 02/08/2022 90  ? ?GlipiZIDE 5MG       TAB 02/08/2022 90  ? ?METFORMIN ER 750MG  TAB 02/08/2022 90  ? ?Reviewed chart prior to disease state call. Spoke with patient regarding BP ? ?Recent Office Vitals: ?BP Readings from Last 3 Encounters:  ?02/08/22 (!) 144/98  ?11/25/20 124/80  ?08/26/20 (!) 130/92  ? ?Pulse Readings from Last 3 Encounters:  ?02/08/22 95  ?08/26/20 (!) 101  ?05/26/20 (!) 115  ?  ?Wt Readings from Last 3 Encounters:  ?02/08/22 229 lb 14.4 oz (104.3 kg)  ?11/25/20 226 lb (102.5 kg)  ?08/26/20 224 lb (101.6 kg)  ?  ? ?Kidney Function ?Lab Results  ?Component Value Date/Time  ? CREATININE 1.50 (H) 02/08/2022 11:36 AM  ? CREATININE 1.24 (H) 11/25/2020 08:40 AM  ? CREATININE 1.27 (H) 11/25/2019 03:02 PM  ? GFR 31.99 (L) 02/08/2022 11:36 AM  ? GFRNONAA 38 (L) 11/29/2018 05:18 PM  ? GFRAA 44 (L) 11/29/2018 05:18 PM  ? ? ? ?  Latest Ref Rng & Units 02/08/2022  ? 11:36 AM 11/25/2020  ?  8:40 AM 11/25/2019  ?  3:02 PM  ?BMP  ?Glucose 70 - 99 mg/dL 225   124  200    ?BUN 6 - 23 mg/dL 37   25   35    ?Creatinine 0.40 - 1.20 mg/dL 1.50   1.24   1.27    ?BUN/Creat Ratio 6 - 22 (calc)  20     ?Sodium 135 - 145 mEq/L 137   143   139    ?Potassium 3.5 - 5.1 mEq/L 4.6   4.7   4.7    ?Chloride 96 - 112 mEq/L 101   106   103    ?CO2 19 - 32 mEq/L '28   27   26    ' ?Calcium 8.4 - 10.5 mg/dL 9.5   9.6   9.8    ? ? ?Current antihypertensive regimen:  ?Amlodipine 5 mg - take 1 tablet daily ?Zestoretic 20/25 - take 1 tablet daily ?Metoprolol 50 mg - take 1 tablet two times daily ? ?How often are you checking your Blood Pressure? Patient checks her blood pressure 2-3 times per week. ? ?Current home BP readings: Patient states she doesn't keep a log however, it is running 130/75 and 135/80. She states it has been creeping up a little  but no bad readings.  ? ?What recent interventions/DTPs have been made by any provider to improve Blood Pressure control since last CPP Visit: No recent interventions. ? ?Any recent hospitalizations or ED visits since last visit with CPP? No recent hospital visit.  ? ?What diet changes have been made to improve Blood Pressure Control?  ?Patient tries no to eat white foods ?Breakfast - patient will have a coffee and occasionally have a half bagel with cheese ?Lunch - patient will have a piece of fruit ?Dinner - patient will have a meat and vegetable ? ?What exercise is being done to improve your Blood Pressure Control?  ?Patient will walk around the house throughout the day.  ? ?Adherence Review: ?Is the patient currently on ACE/ARB medication? Yes ?Does the patient have >5 day gap between last estimated fill dates? No ? ?Care Gaps: ?AWV - message sent to Ramond Craver ?Last BP - 144/98 on 02/08/2022 ?Last A1C - 7.6 02/08/2022 ?Foot exam - overdue ?Covid booster - overdue ?  ?Star Rating Drugs: ?Metformin (Glucophage XR) - Last [facility]/08/2022 90[facility]  ?Lisinopril HCTZ 20-44m - [facility] ?Pravastatin 468m- [facility]?Glipizide 2.5 mg - [facility] ?JaGennie AlmaMA  ?Clinical Pharmacist Assistant ?[phone] ?

## 2022-04-11 ENCOUNTER — Telehealth: Payer: Self-pay | Admitting: Family Medicine

## 2022-04-11 NOTE — Telephone Encounter (Signed)
Left message for patient to call back and schedule Medicare Annual Wellness Visit (AWV).  ? ?Please offer to do virtually or by telephone.  Left office number. ? ?Last AWV:02/18/2021 ? ?Please schedule at anytime with Nurse Health Advisor. ?  ?

## 2022-05-09 ENCOUNTER — Ambulatory Visit: Payer: Medicare HMO | Admitting: Family Medicine

## 2022-05-18 ENCOUNTER — Encounter: Payer: Self-pay | Admitting: Family Medicine

## 2022-05-18 ENCOUNTER — Ambulatory Visit (INDEPENDENT_AMBULATORY_CARE_PROVIDER_SITE_OTHER): Payer: Medicare HMO | Admitting: Family Medicine

## 2022-05-18 VITALS — BP 134/80 | HR 102 | Temp 97.7°F | Ht 62.5 in | Wt 225.2 lb

## 2022-05-18 DIAGNOSIS — E118 Type 2 diabetes mellitus with unspecified complications: Secondary | ICD-10-CM

## 2022-05-18 DIAGNOSIS — E785 Hyperlipidemia, unspecified: Secondary | ICD-10-CM

## 2022-05-18 DIAGNOSIS — M545 Low back pain, unspecified: Secondary | ICD-10-CM | POA: Diagnosis not present

## 2022-05-18 DIAGNOSIS — I10 Essential (primary) hypertension: Secondary | ICD-10-CM | POA: Diagnosis not present

## 2022-05-18 DIAGNOSIS — I130 Hypertensive heart and chronic kidney disease with heart failure and stage 1 through stage 4 chronic kidney disease, or unspecified chronic kidney disease: Secondary | ICD-10-CM | POA: Diagnosis not present

## 2022-05-18 DIAGNOSIS — E1122 Type 2 diabetes mellitus with diabetic chronic kidney disease: Secondary | ICD-10-CM | POA: Diagnosis not present

## 2022-05-18 DIAGNOSIS — I509 Heart failure, unspecified: Secondary | ICD-10-CM | POA: Diagnosis not present

## 2022-05-18 DIAGNOSIS — Z6841 Body Mass Index (BMI) 40.0 and over, adult: Secondary | ICD-10-CM | POA: Diagnosis not present

## 2022-05-18 DIAGNOSIS — N183 Chronic kidney disease, stage 3 unspecified: Secondary | ICD-10-CM | POA: Diagnosis not present

## 2022-05-18 LAB — BASIC METABOLIC PANEL
BUN: 37 mg/dL — ABNORMAL HIGH (ref 6–23)
CO2: 25 mEq/L (ref 19–32)
Calcium: 9.9 mg/dL (ref 8.4–10.5)
Chloride: 102 mEq/L (ref 96–112)
Creatinine, Ser: 1.45 mg/dL — ABNORMAL HIGH (ref 0.40–1.20)
GFR: 33.25 mL/min — ABNORMAL LOW (ref >60.00)
Glucose, Bld: 157 mg/dL — ABNORMAL HIGH (ref 70–99)
Potassium: 4.4 mEq/L (ref 3.5–5.1)
Sodium: 138 mEq/L (ref 135–145)

## 2022-05-18 LAB — LIPID PANEL
Cholesterol: 170 mg/dL (ref 0–200)
HDL: 43.8 mg/dL (ref >39.00)
NonHDL: 126.25
Total CHOL/HDL Ratio: 4
Triglycerides: 259 mg/dL — ABNORMAL HIGH (ref 0.0–149.0)
VLDL: 51.8 mg/dL — ABNORMAL HIGH (ref 0.0–40.0)

## 2022-05-18 LAB — HEPATIC FUNCTION PANEL
ALT: 14 U/L (ref 0–35)
AST: 15 U/L (ref 0–37)
Albumin: 4.2 g/dL (ref 3.5–5.2)
Alkaline Phosphatase: 116 U/L (ref 39–117)
Bilirubin, Direct: 0.1 mg/dL (ref 0.0–0.3)
Total Bilirubin: 0.4 mg/dL (ref 0.2–1.2)
Total Protein: 7.1 g/dL (ref 6.0–8.3)

## 2022-05-18 LAB — HEMOGLOBIN A1C: Hgb A1c MFr Bld: 7.8 % — ABNORMAL HIGH (ref 4.6–6.5)

## 2022-05-18 LAB — LDL CHOLESTEROL, DIRECT: Direct LDL: 89 mg/dL

## 2022-05-18 NOTE — Progress Notes (Signed)
Established Patient Office Visit  Subjective   Patient ID: Michaela Rhodes, female    DOB: 04-29-1936  Age: 86 y.o. MRN: [id number]  Chief Complaint  Patient presents with   Follow-up    HPI   Seen for medical follow-up.  She has history of obesity, hypertension, history of SVT, history of DVT, type 2 diabetes, hypothyroidism, chronic kidney disease stage III, hyperlipidemia.  She has had remote history of breast cancer.  Generally doing well.  Her major complaint is chronic daily low back pain.  She has had this for years.  Worse with ambulation and worse with back extension.  No radiculitis symptoms.  Denies any significant lower extremity weakness.  She feels like she needs more exercise to increase mobility  Type 2 diabetes.  Last A1c 7.6%.  She is on low-dose glipizide and metformin.  Not checking blood sugars regularly.  Very high lipids with last visit.  She had cholesterol 271 and apparently was not taking her pravastatin.  She thinks she is back on this currently.  Denies any myalgias.  Past Medical History:  Diagnosis Date   Cancer [facility])    breast cancer   DM2 (diabetes mellitus, type 2) ([place])    Hyperlipidemia    Hypertension    Hypothyroidism    Panic attacks    mild   SVT (supraventricular tachycardia) ([place])    in the past   Wears glasses    Past Surgical History:  Procedure Laterality Date   ABDOMINAL HYSTERECTOMY     BSO as well   APPENDECTOMY     APPLICATION OF A-CELL OF EXTREMITY Left 01/15/2015   Procedure: APPLICATION OF A-CELL OF EXTREMITY;  Surgeon: Theodoro Kos, DO;  Location: [place];  Service: Plastics;  Laterality: Left;   CHOLECYSTECTOMY     I & D EXTREMITY Left 01/15/2015   Procedure: IRRIGATION AND DEBRIDEMENT EXTREMITY;  Surgeon: Theodoro Kos, DO;  Location: [place];  Service: Plastics;  Laterality: Left;   IR KYPHO EA ADDL LEVEL THORACIC OR LUMBAR  05/10/2017   IR KYPHO THORACIC WITH BONE BIOPSY   05/10/2017   IR RADIOLOGIST EVAL & MGMT  04/28/2017   MASTECTOMY  1985   Left   RECONSTRUCTION BREAST W/ LATISSIMUS DORSI FLAP     TOTAL SHOULDER ARTHROPLASTY  05/27/2012   Procedure: TOTAL SHOULDER ARTHROPLASTY;  Surgeon: Johnny Bridge, MD;  Location: [place];  Service: Orthopedics;  Laterality: Right;    reports that she has never smoked. She has never used smokeless tobacco. She reports that she does not drink alcohol and does not use drugs. family history includes Coronary artery disease in an other family member; Diabetes in an other family member; Heart disease in her mother; Hypertension in her father, mother, sister, and another family member; Stroke in her father. Allergies  Allergen Reactions   Penicillins Anaphylaxis and Hives    Has patient had a PCN reaction causing immediate rash, facial/tongue/throat swelling, SOB or lightheadedness with hypotension: Yes Has patient had a PCN reaction causing severe rash involving mucus membranes or skin necrosis: Yes Has patient had a PCN reaction that required hospitalization: No Has patient had a PCN reaction occurring within the last 10 years: Yes If all of the above answers are "NO", then may proceed with Cephalosporin use.    Keflex [Cephalexin]     Rash     Review of Systems  Constitutional:  Negative for chills and fever.  Respiratory:  Negative for shortness of  breath.   Cardiovascular:  Negative for chest pain.  Genitourinary:  Negative for dysuria.  Endo/Heme/Allergies:  Negative for polydipsia.     Objective:     BP 134/80 (BP Location: Left Arm, Patient Position: Sitting, Cuff Size: Normal)   Pulse (!) 102   Temp 97.7 F (36.5 C) (Oral)   Ht 5' 2.5" (1.588 m)   Wt 225 lb 3.2 oz (102.2 kg)   SpO2 97%   BMI 40.53 kg/m  BP Readings from Last 3 Encounters:  05/18/22 134/80  02/08/22 (!) 144/98  11/25/20 124/80   Wt Readings from Last 3 Encounters:  05/18/22 225 lb 3.2 oz (102.2 kg)  02/08/22 229 lb 14.4 oz (104.3  kg)  11/25/20 226 lb (102.5 kg)      Physical Exam Vitals reviewed.  Cardiovascular:     Rate and Rhythm: Normal rate.  Pulmonary:     Effort: Pulmonary effort is normal.     Breath sounds: Normal breath sounds.  Musculoskeletal:     Right lower leg: No edema.     Left lower leg: No edema.  Neurological:     General: No focal deficit present.     Mental Status: She is alert.     No results found for any visits on 05/18/22.    The ASCVD Risk score (Arnett DK, et al., 2019) failed to calculate for the following reasons:   The 2019 ASCVD risk score is only valid for ages 62 to 56    Assessment & Plan:   Problem List Items Addressed This Visit       Unprioritized   Benign essential HTN - Primary   Relevant Orders   Basic metabolic panel   Diabetes mellitus with complication ([place])   Relevant Orders   Hemoglobin A1c   Hyperlipidemia   Relevant Orders   Lipid panel   Hepatic function panel   Other Visit Diagnoses     Stage 3 chronic kidney disease, unspecified whether stage 3a or 3b CKD (HCC)       Bilateral low back pain without sciatica, unspecified chronicity         -Blood pressure slightly improved today compared with prior readings.  Continue amlodipine along with lisinopril HCTZ and metoprolol. -Recheck labs as above including A1c and repeat lipids. -Suspect she has some lumbar stenosis.  We did discuss alternative exercises such as pool exercises or stationary bike/recumbent bike. -Discussed use of Tylenol for pain and if not adequately controlled may need to look at other alternatives.  Avoid nonsteroidals with her chronic kidney disease and diabetes history. -Set up routine follow-up in 3 months and sooner as needed  Return in about 3 months (around 08/18/2022).    Carolann Littler, MD

## 2022-05-27 ENCOUNTER — Ambulatory Visit (INDEPENDENT_AMBULATORY_CARE_PROVIDER_SITE_OTHER): Payer: Medicare HMO

## 2022-05-27 VITALS — Ht 62.5 in | Wt 225.0 lb

## 2022-05-27 DIAGNOSIS — Z0000 Encounter for general adult medical examination without abnormal findings: Secondary | ICD-10-CM | POA: Diagnosis not present

## 2022-05-27 NOTE — Progress Notes (Signed)
Subjective:   Michaela Rhodes is a 86 y.o. female who presents for Medicare Annual (Subsequent) preventive examination.  Review of Systems    Virtual Visit via Telephone Note  I connected with  Michaela Rhodes on 05/27/22 at  3:30 PM EDT by telephone and verified that I am speaking with the correct person using two identifiers.  Location: Patient: Home Provider: Office Persons participating in the virtual visit: patient/Nurse Health Advisor   I discussed the limitations, risks, security and privacy concerns of performing an evaluation and management service by telephone and the availability of in person appointments. The patient expressed understanding and agreed to proceed.  Interactive audio and video telecommunications were attempted between this nurse and patient, however failed, due to patient having technical difficulties OR patient did not have access to video capability.  We continued and completed visit with audio only.  Some vital signs may be absent or patient reported.   Criselda Peaches, LPN  Cardiac Risk Factors include: advanced age (>81mn, >>25women);diabetes mellitus;obesity (BMI >30kg/m2);hypertension     Objective:    Today's Vitals   05/27/22 1543  Weight: 225 lb (102.1 kg)  Height: 5' 2.5" (1.588 m)   Body mass index is 40.5 kg/m.     05/27/2022    3:57 PM 02/18/2021   11:35 AM 08/03/2017    8:35 AM 07/12/2017    8:00 AM 05/10/2017    9:55 AM 11/29/2016    3:40 PM 01/15/2015   11:14 AM  Advanced Directives  Does Patient Have a Medical Advance Directive? Yes Yes Yes No Yes Yes Yes  Type of [place] will [place] will Living will  [place] will   Does patient want to make changes to medical advance directive? No - Patient declined  No - Patient declined  No - Patient declined    Copy of [place]  Chart? No - copy requested No - copy requested   Yes No - copy requested   Would patient like information on creating a medical advance directive?    No - Patient declined   Yes - Educational materials given    Current Medications (verified) Outpatient Encounter Medications as of 05/27/2022  Medication Sig   amLODipine (NORVASC) 5 MG tablet Take 1 tablet (5 mg total) by mouth daily.   apixaban (ELIQUIS) 2.5 MG TABS tablet Take 1 tablet (2.5 mg total) by mouth 2 (two) times daily.   Blood Glucose Monitoring Suppl (ACCU-CHEK GUIDE) w/Device KIT 1 Device by Other route daily. Use to test blood sugar once daily. Dx Code E11.65   calcium-vitamin D (OSCAL WITH D) 500-200 MG-UNIT per tablet Take 1 tablet by mouth daily.   glipiZIDE (GLUCOTROL) 5 MG tablet Take 0.5 tablets (2.5 mg total) by mouth daily before breakfast.   glucose blood (ACCU-CHEK GUIDE) test strip Use as instructed to test blood sugar once daily. Dx Code E11.65   insulin glargine, 2 Unit Dial, (TOUJEO MAX SOLOSTAR) 300 UNIT/ML Solostar Pen Inject 24 Units into the skin daily.   Insulin Pen Needle (BD PEN NEEDLE NANO U/F) 32G X 4 MM MISC Use to inject insulin once daily. Dx Code E11.65   levothyroxine (SYNTHROID) 137 MCG tablet Take 1 tablet (137 mcg total) by mouth daily before breakfast.   lisinopril-hydrochlorothiazide (ZESTORETIC) 20-25 MG tablet Take 1 tablet by mouth daily.   metFORMIN (GLUCOPHAGE-XR) 750 MG 24 hr tablet  TAKE 1 TABLET BY MOUTH TWICE DAILY AT  10  AM  AND  5  PM   metoprolol tartrate (LOPRESSOR) 50 MG tablet Take 1 tablet (50 mg total) by mouth 2 (two) times daily.   pravastatin (PRAVACHOL) 40 MG tablet Take 1 tablet (40 mg total) by mouth daily.   No facility-administered encounter medications on file as of 05/27/2022.    Allergies (verified) Penicillins and Keflex [cephalexin]   History: Past Medical History:  Diagnosis Date   Cancer ([place])    breast cancer   DM2 (diabetes mellitus, type 2) ([place])     Hyperlipidemia    Hypertension    Hypothyroidism    Panic attacks    mild   SVT (supraventricular tachycardia) ([place])    in the past   Wears glasses    Past Surgical History:  Procedure Laterality Date   ABDOMINAL HYSTERECTOMY     BSO as well   APPENDECTOMY     APPLICATION OF A-CELL OF EXTREMITY Left 01/15/2015   Procedure: APPLICATION OF A-CELL OF EXTREMITY;  Surgeon: Theodoro Kos, DO;  Location: [place];  Service: Plastics;  Laterality: Left;   CHOLECYSTECTOMY     I & D EXTREMITY Left 01/15/2015   Procedure: IRRIGATION AND DEBRIDEMENT EXTREMITY;  Surgeon: Theodoro Kos, DO;  Location: [place];  Service: Plastics;  Laterality: Left;   IR KYPHO EA ADDL LEVEL THORACIC OR LUMBAR  05/10/2017   IR KYPHO THORACIC WITH BONE BIOPSY  05/10/2017   IR RADIOLOGIST EVAL & MGMT  04/28/2017   MASTECTOMY  1985   Left   RECONSTRUCTION BREAST W/ LATISSIMUS DORSI FLAP     TOTAL SHOULDER ARTHROPLASTY  05/27/2012   Procedure: TOTAL SHOULDER ARTHROPLASTY;  Surgeon: Johnny Bridge, MD;  Location: [place];  Service: Orthopedics;  Laterality: Right;   Family History  Problem Relation Age of Onset   Heart disease Mother    Hypertension Mother    Stroke Father    Hypertension Father    Coronary artery disease Other        family hx of   Diabetes Other        family hx of   Hypertension Other        family hx of   Hypertension Sister    Social History   Socioeconomic History   Marital status: Widowed    Spouse name: Not on file   Number of children: Not on file   Years of education: Not on file   Highest education level: Not on file  Occupational History   Occupation: retired    Comment: had [employer] business  Tobacco Use   Smoking status: Never   Smokeless tobacco: Never  Vaping Use   Vaping Use: Never used  Substance and Sexual Activity   Alcohol use: No   Drug use: No   Sexual activity: Never  Other Topics Concern   Not on file  Social History  Narrative   Not on file   Social Determinants of Health   Financial Resource Strain: Low Risk  (05/27/2022)   Overall Financial Resource Strain (CARDIA)    Difficulty of Paying Living Expenses: Not hard at all  Food Insecurity: No Food Insecurity (05/27/2022)   Hunger Vital Sign    Worried About Running Out of Food in the Last Year: Never true    [place] in the Last Year: Never true  Transportation Needs: No Transportation Needs (05/27/2022)   PRAPARE - Transportation  Lack of Transportation (Medical): No    Lack of Transportation (Non-Medical): No  Physical Activity: Inactive (05/27/2022)   Exercise Vital Sign    Days of Exercise per Week: 0 days    Minutes of Exercise per Session: 0 min  Stress: No Stress Concern Present (05/27/2022)   [place]    Feeling of Stress : Not at all  Social Connections: Moderately Isolated (05/27/2022)   Social Connection and Isolation Panel [NHANES]    Frequency of Communication with Friends and Family: More than three times a week    Frequency of Social Gatherings with Friends and Family: More than three times a week    Attends Religious Services: Never    Marine scientist or Organizations: Yes    Attends Music therapist: More than 4 times per year    Marital Status: Widowed     Clinical Intake: Nutrition Risk Assessment:  Has the patient had any N/V/D within the last 2 months?  No  Does the patient have any non-healing wounds?  No  Has the patient had any unintentional weight loss or weight gain?  No   Diabetes:  Is the patient diabetic?  Yes  If diabetic, was a CBG obtained today?  Yes CBG 110 Taken by patient Did the patient bring in their glucometer from home?  No  How often do you monitor your CBG's? Daily.   Financial Strains and Diabetes Management:  Are you having any financial strains with the device, your supplies or your medication? No .   Does the patient want to be seen by Chronic Care Management for management of their diabetes?  No  Would the patient like to be referred to a Nutritionist or for Diabetic Management?  No   Diabetic Exams:  Diabetic Eye Exam: Completed Yes. Overdue for diabetic eye exam. Pt has been advised about the importance in completing this exam. A referral has been placed today. Message sent to referral coordinator for scheduling purposes. Advised pt to expect a call from office referred to regarding appt.  Diabetic Foot Exam: Completed Yes. Pt has been advised about the importance in completing this exam. Pt is scheduled for diabetic foot exam on Followed by PCP.   Pre-visit preparation completed: No Diabetic?  Yes  Interpreter Needed?: NoActivities of Daily Living    05/27/2022    3:56 PM  In your present state of health, do you have any difficulty performing the following activities:  Hearing? 0  Vision? 0  Difficulty concentrating or making decisions? 0  Walking or climbing stairs? 0  Dressing or bathing? 0  Doing errands, shopping? 0  Preparing Food and eating ? N  Using the Toilet? N  In the past six months, have you accidently leaked urine? N  Do you have problems with loss of bowel control? N  Managing your Medications? N  Managing your Finances? N  Housekeeping or managing your Housekeeping? N    Patient Care Team: [name], MD as PCP - General (Family Medicine) Elease Hashimoto, Alinda Sierras, MD (Family Medicine) Viona Gilmore, [facility] as Pharmacist (Pharmacist)  Indicate any recent Medical Services you may have received from other than [facility] providers in the past year (date may be approximate).     Assessment:   This is a routine wellness examination for [place].  Hearing/Vision screen Hearing Screening - Comments:: No hearing difficulty Vision Screening - Comments:: No vision difficulty. Follpowed by [facility]  Dietary issues and exercise activities  discussed: Exercise limited by: None identified   Goals Addressed               This Visit's Progress     Not fall (pt-stated)         Depression Screen    05/27/2022    3:54 PM 02/08/2022   11:33 AM 02/18/2021   11:38 AM 05/26/2020   10:15 AM 12/17/2018    2:21 PM 04/05/2017    8:34 AM 03/31/2016    1:30 PM  PHQ 2/9 Scores  PHQ - 2 Score 0 0 0 0 0 0 0  PHQ- 9 Score  2         Fall Risk    05/27/2022    3:57 PM 05/18/2022    1:38 PM 02/18/2021   11:36 AM 05/26/2020   10:15 AM 12/17/2018    2:21 PM  [place] in the past year? 0 0 0 0 0  Number falls in past yr: 0 0 0 0   Injury with Fall? 0 0 0 0   Risk for fall due to : No Fall Risks No Fall Risks Orthopedic patient;History of fall(s)    Follow up  Falls evaluation completed Falls evaluation completed;Falls prevention discussed      FALL RISK PREVENTION PERTAINING TO THE HOME:  Any stairs in or around the home? Yes  If so, are there any without handrails? No  Home free of loose throw rugs in walkways, pet beds, electrical cords, etc? Yes  Adequate lighting in your home to reduce risk of falls? Yes   ASSISTIVE DEVICES UTILIZED TO PREVENT FALLS:  Life alert? No  Use of a cane, walker or w/c? No  Grab bars in the bathroom? No  Shower chair or bench in shower? Yes  Elevated toilet seat or a handicapped toilet? No   TIMED UP AND GO:  Was the test performed? No . Audio Visit  Cognitive Function:    Immunizations Immunization History  Administered Date(s) Administered   Fluad Quad(high Dose 65+) 08/26/2020   Influenza Split 09/25/2012   Influenza,inj,Quad PF,6+ Mos 08/30/2013, 12/22/2014   PFIZER(Purple Top)SARS-COV-2 Vaccination 01/03/2020, 01/24/2020, 12/08/2020   Pneumococcal Conjugate-13 11/25/2020   Pneumococcal Polysaccharide-23 09/25/2012   Tdap 12/26/2009    TDAP status: Up to date  Flu Vaccine status: Declined, Education has been provided regarding the importance of this vaccine but patient  still declined. Advised may receive this vaccine at local pharmacy or Health Dept. Aware to provide a copy of the vaccination record if obtained from local pharmacy or Health Dept. Verbalized acceptance and understanding.  Pneumococcal vaccine status: Up to date  Covid-19 vaccine status: Completed vaccines  Qualifies for Shingles Vaccine? Yes   Zostavax completed No   Shingrix Completed?: No.    Education has been provided regarding the importance of this vaccine. Patient has been advised to call insurance company to determine out of pocket expense if they have not yet received this vaccine. Advised may also receive vaccine at local pharmacy or Health Dept. Verbalized acceptance and understanding.  Screening Tests Health Maintenance  Topic Date Due   OPHTHALMOLOGY EXAM  03/03/2021   FOOT EXAM  08/26/2021   COVID-19 Vaccine (4 - Booster for Pfizer series) 06/12/2022 (Originally 02/02/2021)   Zoster Vaccines- Shingrix (1 of 2) 08/27/2022 (Originally 06/02/1955)   TETANUS/TDAP  05/28/2023 (Originally 12/27/2019)   INFLUENZA VACCINE  07/19/2022   HEMOGLOBIN A1C  11/17/2022   Pneumonia Vaccine 65+  Years old  Completed   DEXA SCAN  Completed   HPV VACCINES  Aged Out    Health Maintenance  Health Maintenance Due  Topic Date Due   OPHTHALMOLOGY EXAM  03/03/2021   FOOT EXAM  08/26/2021    Colorectal cancer screening: No longer required.   Mammogram status: No longer required due to Age.  Bone Density status: Completed 03/03/16. Results reflect: Bone density results: OSTEOPOROSIS. Repeat every 2 years.  Lung Cancer Screening: (Low Dose CT Chest recommended if Age 25-80 years, 30 pack-year currently smoking OR have quit w/in 15years.) does not qualify.     Additional Screening:  Hepatitis C Screening: does not qualify; Completed    Vision Screening: Recommended annual ophthalmology exams for early detection of glaucoma and other disorders of the eye. Is the patient up to date with  their annual eye exam?  Yes  Who is the provider or what is the name of the office in which the patient attends annual eye exams? [facility] If pt is not established with a provider, would they like to be referred to a provider to establish care? No .   Dental Screening: Recommended annual dental exams for proper oral hygiene  Community Resource Referral / Chronic Care Management:  CRR required this visit?  No   CCM required this visit?  No      Plan:     I have personally reviewed and noted the following in the patient's chart:   Medical and social history Use of alcohol, tobacco or illicit drugs  Current medications and supplements including opioid prescriptions.  Functional ability and status Nutritional status Physical activity Advanced directives List of other physicians Hospitalizations, surgeries, and ER visits in previous 12 months Vitals Screenings to include cognitive, depression, and falls Referrals and appointments  In addition, I have reviewed and discussed with patient certain preventive protocols, quality metrics, and best practice recommendations. A written personalized care plan for preventive services as well as general preventive health recommendations were provided to patient.     Criselda Peaches, LPN   1/0/3013   Nurse Notes: None

## 2022-05-27 NOTE — Patient Instructions (Addendum)
Michaela Rhodes , Thank you for taking time to come for your Medicare Wellness Visit. I appreciate your ongoing commitment to your health goals. Please review the following plan we discussed and let me know if I can assist you in the future.   These are the goals we discussed:  Goals       Not fall (pt-stated)      Pharmacy Care Plan      CARE PLAN ENTRY (see longitudinal plan of care for additional care plan information)  Current Barriers:  Chronic Disease Management support, education, and care coordination needs related to Hypertension, Hyperlipidemia, Diabetes, Hypothyroidism, and Osteopenia, DVT   Hypertension BP Readings from Last 3 Encounters:  08/26/20 (!) 130/92  05/26/20 126/82  02/24/20 124/80  Pharmacist Clinical Goal(s): Over the next 90 days, patient will work with PharmD and providers to maintain BP goal <130/80 Current regimen:  Amlodipine '5mg'$ , 1 tablet once daily Lisinopril/HCTZ  20-'25mg'$ , 1 tablet once daily Metoprolol tartrate '50mg'$ , 1 tablet twice daily Interventions: We discussed differenct mechanism of action of HTN regimen and importance of adherence  Patient self care activities - Over the next 90 days, patient will: Check blood pressure 2 to 3 times per month, document, and provide at future appointments Ensure daily salt intake < 2300 mg/day  Hyperlipidemia Lab Results  Component Value Date/Time   LDLCALC 118 (H) 04/05/2017 09:19 AM   LDLDIRECT 91.0 11/25/2019 03:02 PM  Pharmacist Clinical Goal(s): Over the next 90 days, patient will work with PharmD and providers to achieve LDL goal < 70 Current regimen:  Pravastatin '40mg'$ , 1 tablet once daily  Interventions: We discussed how a diet high in plant sterols (fruits/vegetables/nuts/whole grains/legumes) may reduce your cholesterol.  Encouraged increasing fiber to a daily intake of 10-25g/day  Patient self care activities - Over the next 90 days, patient will: Continue lifestyle modifications (diet and  physical activity)  Diabetes Lab Results  Component Value Date/Time   HGBA1C 7.1 (A) 08/26/2020 08:44 AM   HGBA1C 8.1 (A) 05/26/2020 10:16 AM   HGBA1C 9.3 (H) 03/18/2019 10:33 AM   HGBA1C 7.9 (H) 07/12/2017 03:46 AM  Pharmacist Clinical Goal(s): Over the next 90 days, patient will work with PharmD and providers to achieve A1c goal <7% Current regimen:  glimepiride '2mg'$  ,1 tablet once daily Insulin glargine (Toujeo Max Solostar), inject 20 units into skin daily Metformin ER '750mg'$ , 1 tablet twice daily at 10AM and 5PM  Interventions: We discussed: diet and exercise extensively and how to recognize and treat signs of hypoglycemia  Difference mechanism of action of DM regimen and importance of adherence.   Patient self care activities - Over the next 90 days, patient will: Check blood sugar once daily, document, and provide at future appointments Contact provider with any episodes of hypoglycemia  Hypothyroidism Pharmacist Clinical Goal(s) Over the next 90 days, patient will work with PharmD and providers to maintain TSH: 0.35 to 4.5 uIU/mL Current regimen:  Euthyrox 175mg, 1 tablet once daily before breakfast  Interventions: We discussed:  discussed consistent administration of levothyroxine at least 30 minutes before first meal  Patient self care activities - Over the next 90 days, patient will: Continue current medications as instructed.   DVT (blood clot) Pharmacist Clinical Goal(s) Over the next 90 days, patient will work with PharmD and providers to decrease risk of blood clot reoccurrence  Current regimen:  apixaban 2.'5mg'$ , 1 tablet twice daily  Interventions: We discussed: monitoring for signs and symptoms for bleeding (coughing up blood, prolonged nose  bleeds, black, tarry stools). Patient self care activities Patient will continue current medications as instructed  Osteopenia Pharmacist Clinical Goal(s) Over the next 90 days, patient will work with PharmD and  providers to maintain proper vitamin D and calcium intake to prevent bone fractures.  Current regimen:  Calcium- vitamin D 500-'200mg'$ , 1 tablet once daily (at night) Interventions: Recommend [phone] units of vitamin D daily.  Recommend 1200 mg of calcium daily from dietary and supplemental sources.  Recommend weight-bearing and muscle strengthening exercises for building and maintaining bone density Patient self care activities Patient will continue current supplement as instructed and incorporate weight-bearing exercises.   Medication management Pharmacist Clinical Goal(s): Over the next 90 days, patient will work with PharmD and providers to maintain optimal medication adherence Current pharmacy: [facility] Interventions Comprehensive medication review performed. Continue current medication management strategy Patient self care activities - Over the next 90 days, patient will: Take medications as prescribed Report any questions or concerns to PharmD and/or provider(s)  Please see past updates related to this goal by clicking on the "Past Updates" button in the selected goal        Weight (lb) < 200 lb (90.7 kg)        This is a list of the screening recommended for you and due dates:  Health Maintenance  Topic Date Due   Eye exam for diabetics  03/03/2021   Complete foot exam   08/26/2021   COVID-19 Vaccine (4 - Booster for Pfizer series) 06/12/2022*   Zoster (Shingles) Vaccine (1 of 2) 08/27/2022*   Tetanus Vaccine  05/28/2023*   Flu Shot  07/19/2022   Hemoglobin A1C  11/17/2022   Pneumonia Vaccine  Completed   DEXA scan (bone density measurement)  Completed   HPV Vaccine  Aged Out  *Topic was postponed. The date shown is not the original due date.   Advanced directives: Yes Patient will submit copy  Conditions/risks identified: None  Next appointment: Follow up in one year for your annual wellness visit     Preventive Care 65 Years and Older, Female Preventive care  refers to lifestyle choices and visits with your health care provider that can promote health and wellness. What does preventive care include? A yearly physical exam. This is also called an annual well check. Dental exams once or twice a year. Routine eye exams. Ask your health care provider how often you should have your eyes checked. Personal lifestyle choices, including: Daily care of your teeth and gums. Regular physical activity. Eating a healthy diet. Avoiding tobacco and drug use. Limiting alcohol use. Practicing safe sex. Taking low-dose aspirin every day. Taking vitamin and mineral supplements as recommended by your health care provider. What happens during an annual well check? The services and screenings done by your health care provider during your annual well check will depend on your age, overall health, lifestyle risk factors, and family history of disease. Counseling  Your health care provider may ask you questions about your: Alcohol use. Tobacco use. Drug use. Emotional well-being. Home and relationship well-being. Sexual activity. Eating habits. History of falls. Memory and ability to understand (cognition). Work and work Statistician. Reproductive health. Screening  You may have the following tests or measurements: Height, weight, and BMI. Blood pressure. Lipid and cholesterol levels. These may be checked every 5 years, or more frequently if you are over 37 years old. Skin check. Lung cancer screening. You may have this screening every year starting at age 3 if you have a 30-pack-year  history of smoking and currently smoke or have quit within the past 15 years. Fecal occult blood test (FOBT) of the stool. You may have this test every year starting at age 27. Flexible sigmoidoscopy or colonoscopy. You may have a sigmoidoscopy every 5 years or a colonoscopy every 10 years starting at age 22. Hepatitis C blood test. Hepatitis B blood test. Sexually transmitted  disease (STD) testing. Diabetes screening. This is done by checking your blood sugar (glucose) after you have not eaten for a while (fasting). You may have this done every 1-3 years. Bone density scan. This is done to screen for osteoporosis. You may have this done starting at age 72. Mammogram. This may be done every 1-2 years. Talk to your health care provider about how often you should have regular mammograms. Talk with your health care provider about your test results, treatment options, and if necessary, the need for more tests. Vaccines  Your health care provider may recommend certain vaccines, such as: Influenza vaccine. This is recommended every year. Tetanus, diphtheria, and acellular pertussis (Tdap, Td) vaccine. You may need a Td booster every 10 years. Zoster vaccine. You may need this after age 72. Pneumococcal 13-valent conjugate (PCV13) vaccine. One dose is recommended after age 48. Pneumococcal polysaccharide (PPSV23) vaccine. One dose is recommended after age 46. Talk to your health care provider about which screenings and vaccines you need and how often you need them. This information is not intended to replace advice given to you by your health care provider. Make sure you discuss any questions you have with your health care provider. Document Released: 01/01/2016 Document Revised: 08/24/2016 Document Reviewed: 10/06/2015 Elsevier Interactive Patient Education  2017 [place] Prevention in the Home Falls can cause injuries. They can happen to people of all ages. There are many things you can do to make your home safe and to help prevent falls. What can I do on the outside of my home? Regularly fix the edges of walkways and driveways and fix any cracks. Remove anything that might make you trip as you walk through a door, such as a raised step or threshold. Trim any bushes or trees on the path to your home. Use bright outdoor lighting. Clear any walking paths of  anything that might make someone trip, such as rocks or tools. Regularly check to see if handrails are loose or broken. Make sure that both sides of any steps have handrails. Any raised decks and porches should have guardrails on the edges. Have any leaves, snow, or ice cleared regularly. Use sand or salt on walking paths during winter. Clean up any spills in your garage right away. This includes oil or grease spills. What can I do in the bathroom? Use night lights. Install grab bars by the toilet and in the tub and shower. Do not use towel bars as grab bars. Use non-skid mats or decals in the tub or shower. If you need to sit down in the shower, use a plastic, non-slip stool. Keep the floor dry. Clean up any water that spills on the floor as soon as it happens. Remove soap buildup in the tub or shower regularly. Attach bath mats securely with double-sided non-slip rug tape. Do not have throw rugs and other things on the floor that can make you trip. What can I do in the bedroom? Use night lights. Make sure that you have a light by your bed that is easy to reach. Do not use any sheets or  blankets that are too big for your bed. They should not hang down onto the floor. Have a firm chair that has side arms. You can use this for support while you get dressed. Do not have throw rugs and other things on the floor that can make you trip. What can I do in the kitchen? Clean up any spills right away. Avoid walking on wet floors. Keep items that you use a lot in easy-to-reach places. If you need to reach something above you, use a strong step stool that has a grab bar. Keep electrical cords out of the way. Do not use floor polish or wax that makes floors slippery. If you must use wax, use non-skid floor wax. Do not have throw rugs and other things on the floor that can make you trip. What can I do with my stairs? Do not leave any items on the stairs. Make sure that there are handrails on both  sides of the stairs and use them. Fix handrails that are broken or loose. Make sure that handrails are as long as the stairways. Check any carpeting to make sure that it is firmly attached to the stairs. Fix any carpet that is loose or worn. Avoid having throw rugs at the top or bottom of the stairs. If you do have throw rugs, attach them to the floor with carpet tape. Make sure that you have a light switch at the top of the stairs and the bottom of the stairs. If you do not have them, ask someone to add them for you. What else can I do to help prevent falls? Wear shoes that: Do not have high heels. Have rubber bottoms. Are comfortable and fit you well. Are closed at the toe. Do not wear sandals. If you use a stepladder: Make sure that it is fully opened. Do not climb a closed stepladder. Make sure that both sides of the stepladder are locked into place. Ask someone to hold it for you, if possible. Clearly mark and make sure that you can see: Any grab bars or handrails. First and last steps. Where the edge of each step is. Use tools that help you move around (mobility aids) if they are needed. These include: Canes. Walkers. Scooters. Crutches. Turn on the lights when you go into a dark area. Replace any light bulbs as soon as they burn out. Set up your furniture so you have a clear path. Avoid moving your furniture around. If any of your floors are uneven, fix them. If there are any pets around you, be aware of where they are. Review your medicines with your doctor. Some medicines can make you feel dizzy. This can increase your chance of falling. Ask your doctor what other things that you can do to help prevent falls. This information is not intended to replace advice given to you by your health care provider. Make sure you discuss any questions you have with your health care provider. Document Released: 10/01/2009 Document Revised: 05/12/2016 Document Reviewed: 01/09/2015 Elsevier  Interactive Patient Education  2017 [employer].

## 2022-06-13 ENCOUNTER — Other Ambulatory Visit: Payer: Self-pay | Admitting: Family Medicine

## 2022-07-19 ENCOUNTER — Telehealth: Payer: Self-pay | Admitting: Pharmacist

## 2022-07-19 NOTE — Chronic Care Management (AMB) (Signed)
    Chronic Care Management Pharmacy Assistant   Name: Michaela Rhodes  MRN: [id number] DOB: [birth year]  07/20/2022 APPOINTMENT REMINDER  Called Carla Drape, No answer, left message of appointment on 07/20/2022 at 11:00 via telephone visit with Jeni Salles, Pharm D. Notified to have all medications, supplements, blood pressure and/or blood sugar logs available during appointment and to return call if need to reschedule.  Care Gaps: AWV - scheduled 05/31/2023 Last BP - 134/80 on 05/18/2022 Last A1C - 7.8 on 05/18/2022 Covid booster - overdue Eye exam - overdue Foot exam - overdue Flu vaccine - overdue Shingrix - postponed Tdap - postponed  Star Rating Drug: Glipizide 5 mg - last filled 05/13/2022 90 DS at [facility] Lisinopril HCTZ 20/'25mg'$  - last filled 05/13/2022 90 DS at [facility] Pravastatin 40 mg - last filled 05/13/2022 90 DS at [facility]  Any gaps in medications fill history? No  Gennie Alma [facility]  Catering manager [phone]

## 2022-07-20 ENCOUNTER — Telehealth: Payer: Self-pay | Admitting: Pharmacist

## 2022-07-20 ENCOUNTER — Telehealth: Payer: Medicare HMO

## 2022-07-20 NOTE — Progress Notes (Deleted)
Chronic Care Management Pharmacy Note  07/20/2022 Name:  Michaela Rhodes MRN:  [id number] DOB:  31-May-1936  Summary: ***  Recommendations/Changes made from today's visit: ***  Plan: ***   Subjective: Michaela Rhodes is an 86 y.o. year old female who is a primary patient of Burchette, Alinda Sierras, MD.  The CCM team was consulted for assistance with disease management and care coordination needs.    Engaged with patient by telephone for follow up visit in response to provider referral for pharmacy case management and/or care coordination services.   Consent to Services:  The patient was given information about Chronic Care Management services, agreed to services, and gave verbal consent prior to initiation of services.  Please see initial visit note for detailed documentation.   Patient Care Team: [name], MD as PCP - General (Family Medicine) [name], MD (Family Medicine) Viona Gilmore, [facility] as Pharmacist (Pharmacist)  Recent office visits: 05/27/22 Rolene Arbour, LPN: Patient presented for AWV.  05/18/22 Carolann Littler, MD: Patient presented for chronic conditions follow up. Follow up in 3 months.  Recent consult visits: None  Hospital visits: None in previous 6 months   Objective:  Lab Results  Component Value Date   CREATININE 1.45 (H) 05/18/2022   BUN 37 (H) 05/18/2022   GFR 33.25 (L) 05/18/2022   GFRNONAA 38 (L) 11/29/2018   GFRAA 44 (L) 11/29/2018   NA 138 05/18/2022   K 4.4 05/18/2022   CALCIUM 9.9 05/18/2022   CO2 25 05/18/2022   GLUCOSE 157 (H) 05/18/2022    Lab Results  Component Value Date/Time   HGBA1C 7.8 (H) 05/18/2022 02:19 PM   HGBA1C 7.6 (A) 02/08/2022 11:04 AM   HGBA1C 7.4 (H) 11/25/2020 08:40 AM   GFR 33.25 (L) 05/18/2022 02:19 PM   GFR 31.99 (L) 02/08/2022 11:36 AM    Last diabetic Eye exam:  Lab Results  Component Value Date/Time   HMDIABEYEEXA No Retinopathy 06/06/2019 12:00 AM    Last diabetic Foot exam:   Lab Results  Component Value Date/Time   HMDIABFOOTEX normal 12/22/2014 12:00 AM     Lab Results  Component Value Date   CHOL 170 05/18/2022   HDL 43.80 05/18/2022   LDLCALC 93 11/25/2020   LDLDIRECT 89.0 05/18/2022   TRIG 259.0 (H) 05/18/2022   CHOLHDL 4 05/18/2022       Latest Ref Rng & Units 05/18/2022    2:19 PM 02/08/2022   11:36 AM 11/25/2020    8:40 AM  Hepatic Function  Total Protein 6.0 - 8.3 g/dL 7.1  6.9  6.6   Albumin 3.5 - 5.2 g/dL 4.2  4.1    AST 0 - 37 U/L '15  15  15   ' ALT 0 - 35 U/L '14  13  15   ' Alk Phosphatase 39 - 117 U/L 116  115    Total Bilirubin 0.2 - 1.2 mg/dL 0.4  0.4  0.5   Bilirubin, Direct 0.0 - 0.3 mg/dL 0.1   0.1     Lab Results  Component Value Date/Time   TSH 5.74 (H) 02/08/2022 11:36 AM   TSH 5.71 (H) 11/25/2020 08:40 AM       Latest Ref Rng & Units 02/08/2022   11:36 AM 11/25/2020    8:40 AM 11/29/2018    5:18 PM  CBC  WBC 4.0 - 10.5 K/uL 8.1  9.6  22.4   Hemoglobin 12.0 - 15.0 g/dL 13.2  13.4  13.4   Hematocrit 36.0 -  46.0 % 41.2  41.3  43.8   Platelets 150.0 - 400.0 K/uL 279.0  335  309     No results found for: "VD25OH"  Clinical ASCVD: {YES/NO:21197} The ASCVD Risk score (Arnett DK, et al., 2019) failed to calculate for the following reasons:   The 2019 ASCVD risk score is only valid for ages 60 to 34       05/27/2022    3:54 PM 02/08/2022   11:33 AM 02/18/2021   11:38 AM  Depression screen PHQ 2/9  Decreased Interest 0 0 0  Down, Depressed, Hopeless 0 0 0  PHQ - 2 Score 0 0 0  Altered sleeping  0   Tired, decreased energy  0   Change in appetite  2   Feeling bad or failure about yourself   0   Trouble concentrating  0   Moving slowly or fidgety/restless  0   Suicidal thoughts  0   PHQ-9 Score  2      ***Other: (CHADS2VASc if Afib, MMRC or CAT for COPD, ACT, DEXA)  Social History   Tobacco Use  Smoking Status Never  Smokeless Tobacco Never   BP Readings from Last 3 Encounters:  05/18/22 134/80  02/08/22  (!) 144/98  11/25/20 124/80   Pulse Readings from Last 3 Encounters:  05/18/22 (!) 102  02/08/22 95  08/26/20 (!) 101   Wt Readings from Last 3 Encounters:  05/27/22 225 lb (102.1 kg)  05/18/22 225 lb 3.2 oz (102.2 kg)  02/08/22 229 lb 14.4 oz (104.3 kg)   BMI Readings from Last 3 Encounters:  05/27/22 40.50 kg/m  05/18/22 40.53 kg/m  02/08/22 41.38 kg/m    Assessment/Interventions: Review of patient past medical history, allergies, medications, health status, including review of consultants reports, laboratory and other test data, was performed as part of comprehensive evaluation and provision of chronic care management services.   SDOH:  (Social Determinants of Health) assessments and interventions performed: {yes/no:20286}  SDOH Screenings   Alcohol Screen: Low Risk  (05/27/2022)   Alcohol Screen    Last Alcohol Screening Score (AUDIT): 0  Depression (PHQ2-9): Low Risk  (05/27/2022)   Depression (PHQ2-9)    PHQ-2 Score: 0  Financial Resource Strain: Low Risk  (05/27/2022)   Overall Financial Resource Strain (CARDIA)    Difficulty of Paying Living Expenses: Not hard at all  Food Insecurity: No Food Insecurity (05/27/2022)   Hunger Vital Sign    Worried About Running Out of Food in the Last Year: Never true    [place] in the Last Year: Never true  Housing: Low Risk  (05/27/2022)   Housing    Last Housing Risk Score: 0  Physical Activity: Inactive (05/27/2022)   Exercise Vital Sign    Days of Exercise per Week: 0 days    Minutes of Exercise per Session: 0 min  Social Connections: Moderately Isolated (05/27/2022)   Social Connection and Isolation Panel [NHANES]    Frequency of Communication with Friends and Family: More than three times a week    Frequency of Social Gatherings with Friends and Family: More than three times a week    Attends Religious Services: Never    Marine scientist or Organizations: Yes    Attends Music therapist: More than 4  times per year    Marital Status: Widowed  Stress: No Stress Concern Present (05/27/2022)   [place]    Feeling of  Stress : Not at all  Tobacco Use: Low Risk  (05/27/2022)   Patient History    Smoking Tobacco Use: Never    Smokeless Tobacco Use: Never    Passive Exposure: Not on file  Transportation Needs: No Transportation Needs (05/27/2022)   PRAPARE - Transportation    Lack of Transportation (Medical): No    Lack of Transportation (Non-Medical): No    CCM Care Plan  Allergies  Allergen Reactions   Penicillins Anaphylaxis and Hives    Has patient had a PCN reaction causing immediate rash, facial/tongue/throat swelling, SOB or lightheadedness with hypotension: Yes Has patient had a PCN reaction causing severe rash involving mucus membranes or skin necrosis: Yes Has patient had a PCN reaction that required hospitalization: No Has patient had a PCN reaction occurring within the last 10 years: Yes If all of the above answers are "NO", then may proceed with Cephalosporin use.    Keflex [Cephalexin]     Rash     Medications Reviewed Today     Reviewed by Criselda Peaches, LPN (Licensed Practical Nurse) on 05/27/22 at 64  Med List Status: <None>   Medication Order Taking? Sig Documenting Provider Last Dose Status Informant  amLODipine (NORVASC) 5 MG tablet [id number] No Take 1 tablet (5 mg total) by mouth daily. [name], MD Taking Active   apixaban (ELIQUIS) 2.5 MG TABS tablet [id number] No Take 1 tablet (2.5 mg total) by mouth 2 (two) times daily. [name], MD Taking Active   Blood Glucose Monitoring Suppl (ACCU-CHEK GUIDE) w/Device KIT [id number] No 1 Device by Other route daily. Use to test blood sugar once daily. Dx Code S23.95 [name], MD Taking Active   calcium-vitamin D (OSCAL WITH D) 500-200 MG-UNIT per tablet [id number] No Take 1 tablet by mouth daily. Marchia Bond, MD Taking  Active Self  glipiZIDE (GLUCOTROL) 5 MG tablet [id number] No Take 0.5 tablets (2.5 mg total) by mouth daily before breakfast. [name], MD Taking Active   glucose blood (ACCU-CHEK GUIDE) test strip [id number] No Use as instructed to test blood sugar once daily. Dx Code M08.02 [name], MD Taking Active   insulin glargine, 2 Unit Dial, (TOUJEO MAX SOLOSTAR) 300 UNIT/ML Solostar Pen [id number] No Inject 24 Units into the skin daily. Burchette, Alinda Sierras, MD Taking Active   Insulin Pen Needle (BD PEN NEEDLE NANO U/F) 32G X 4 MM MISC [id number] No Use to inject insulin once daily. Dx Code E11.65 [name], MD Taking Active   levothyroxine (SYNTHROID) 137 MCG tablet [id number] No Take 1 tablet (137 mcg total) by mouth daily before breakfast. [name], MD Taking Active   lisinopril-hydrochlorothiazide (ZESTORETIC) 20-25 MG tablet [id number] No Take 1 tablet by mouth daily. [name], MD Taking Active   metFORMIN (GLUCOPHAGE-XR) 750 MG 24 hr tablet [id number] No TAKE 1 TABLET BY MOUTH TWICE DAILY AT  10  AM  AND  5  PM Burchette, Alinda Sierras, MD Taking Active   metoprolol tartrate (LOPRESSOR) 50 MG tablet [id number] No Take 1 tablet (50 mg total) by mouth 2 (two) times daily. [name], MD Taking Active   pravastatin (PRAVACHOL) 40 MG tablet [id number] No Take 1 tablet (40 mg total) by mouth daily. [name], MD Taking Active             Patient Active Problem List   Diagnosis Date Noted   Lumbar radiculopathy 02/12/2020   Acute deep vein thrombosis (DVT) of  left lower extremity ([place]) 11/29/2019   S/P thoracentesis    Acute diastolic CHF (congestive heart failure) ([place])    AKI (acute kidney injury) ([place])    CAP (community acquired pneumonia) 07/10/2017   Acute respiratory failure ([place]) 07/10/2017   Diabetes mellitus with complication (HCC) 47/82/9562   Pleural effusion on left 07/10/2017   Lactic acidemia 07/10/2017   Sepsis, unspecified  organism ([place]) 07/10/2017   Osteopenia 04/02/2016   Other specified hypothyroidism    Acute renal failure syndrome ([place])    Cellulitis 12/27/2014   Cellulitis of left lower extremity 12/26/2014   Obesity (BMI 30-39.9) 08/30/2013   Hyperlipidemia 09/25/2012   Breast cancer, left ([place]) 07/26/2012   Syncope 06/26/2012   Type 2 diabetes mellitus, uncontrolled 05/30/2012   Acute renal failure ([place]) 05/30/2012   HTN (hypertension) 05/30/2012   Postoperative anemia due to acute blood loss 05/29/2012   Tachycardia 05/27/2012   Closed fracture of right proximal humerus 05/26/2012   OVERWEIGHT 10/19/2009   Hypothyroidism 10/16/2009   Benign essential HTN 10/16/2009   SVT (supraventricular tachycardia) ([place]) 10/16/2009    Immunization History  Administered Date(s) Administered   Fluad Quad(high Dose 65+) 08/26/2020   Influenza Split 09/25/2012   Influenza,inj,Quad PF,6+ Mos 08/30/2013, 12/22/2014   PFIZER(Purple Top)SARS-COV-2 Vaccination 01/03/2020, 01/24/2020, 12/08/2020   Pneumococcal Conjugate-13 11/25/2020   Pneumococcal Polysaccharide-23 09/25/2012   Tdap 12/26/2009   -needs PCP visit scheduled   Conditions to be addressed/monitored:  Hypertension, Hyperlipidemia, Diabetes, Hypothyroidism, and Osteopenia  Conditions addressed this visit: ***  Current Barriers:  {pharmacybarriers:24917}  Pharmacist Clinical Goal(s):  Patient will {PHARMACYGOALCHOICES:24921} through collaboration with PharmD and provider.   Interventions: 1:1 collaboration with [name], MD regarding development and update of comprehensive plan of care as evidenced by provider attestation and co-signature Inter-disciplinary care team collaboration (see longitudinal plan of care) Comprehensive medication review performed; medication list updated in electronic medical record BP Readings from Last 3 Encounters:  05/18/22 134/80  02/08/22 (!) 144/98  11/25/20 124/80    Hypertension (BP goal  <140/90) -{US controlled/uncontrolled:25276} -Current treatment: Amlodipine 5 mg 1 tablet daily Lisinopril-HCTZ 20-25 mg 1 tablet daily Metoprolol tartrate 50 mg 1 tablet twice daily -Medications previously tried: ***  -Current home readings: *** -Current dietary habits: *** -Current exercise habits: *** -{ACTIONS;DENIES/REPORTS:21021675::"Denies"} hypotensive/hypertensive symptoms -Educated on {CCM BP Counseling:25124} -Counseled to monitor BP at home ***, document, and provide log at future appointments -{CCMPHARMDINTERVENTION:25122}  Lab Results  Component Value Date   CHOL 170 05/18/2022   HDL 43.80 05/18/2022   LDLCALC 93 11/25/2020   LDLDIRECT 89.0 05/18/2022   TRIG 259.0 (H) 05/18/2022   CHOLHDL 4 05/18/2022    Hyperlipidemia: (LDL goal < 100) -{US controlled/uncontrolled:25276} -Current treatment: Pravastatin 40 mg 1 tablet daily -Medications previously tried: ***  -Current dietary patterns: *** -Current exercise habits: *** -Educated on {CCM HLD Counseling:25126} -{CCMPHARMDINTERVENTION:25122}  Diabetes (A1c goal <7%) -Uncontrolled -Current medications: Metformin XR 750 mg 1 tablet twice daily Glipizide 5 mg 1/2 tablet at breakfast Toujeo U-300 inject 24 units once daily -Medications previously tried: ***  -Current home glucose readings fasting glucose: *** post prandial glucose: *** -{ACTIONS;DENIES/REPORTS:21021675::"Denies"} hypoglycemic/hyperglycemic symptoms -Current meal patterns:  breakfast: ***  lunch: ***  dinner: *** snacks: *** drinks: *** -Current exercise: *** -Educated on {CCM DM COUNSELING:25123} -Counseled to check feet daily and get yearly eye exams -{CCMPHARMDINTERVENTION:25122} -cgm?  Osteopenia (Goal ***) -{US controlled/uncontrolled:25276} -Last DEXA Scan: ***   T-Score femoral neck: -1.4  T-Score total hip: n/a  T-Score lumbar spine: -0.6  T-Score forearm radius: n/a  10-year probability of major osteoporotic fracture:  11.5%  10-year probability of hip fracture: 2.4% -Patient {is;is not an osteoporosis candidate:23886} -Current treatment  Calcium 500-200 mg-unit 1 tablet daily? -Medications previously tried: ***  -{Osteoporosis Counseling:23892} -{CCMPHARMDINTERVENTION:25122}  Lab Results  Component Value Date   TSH 5.74 (H) 02/08/2022    Hypothyroidism (Goal: TSH 2.5-4.5) -{US controlled/uncontrolled:25276} -Current treatment  Levothyroxine 137 mcg 1 tablet daily -Medications previously tried: ***  -{CCMPHARMDINTERVENTION:25122}  History of DVT (Goal: ***) -{US controlled/uncontrolled:25276} -Current treatment  Eliquis 2.5 mg 1 tablet twice daily -Medications previously tried: ***  -{CCMPHARMDINTERVENTION:25122} -multiple? Unprovoked?  Health Maintenance -Vaccine gaps: shingrix, COVID booster, tetanus, influenza -Current therapy:  No medications -Educated on {ccm supplement counseling:25128} -{CCM Patient satisfied:25129} -{CCMPHARMDINTERVENTION:25122}  Patient Goals/Self-Care Activities Patient will:  - {pharmacypatientgoals:24919}  Follow Up Plan: {CM FOLLOW UP PLAN:22241}   There are no care plans that you recently modified to display for this patient.    Medication Assistance: {MEDASSISTANCEINFO:25044}  Compliance/Adherence/Medication fill history: Care Gaps: COVID booster, eye exam, foot exam, influenza vaccine, shingrix, tetanus Last BP - 134/80 on 05/18/2022 Last A1C - 7.8 on 05/18/2022  Star-Rating Drugs: Glipizide 5 mg - last filled 05/13/2022 90 DS at [facility] Lisinopril HCTZ 20/51m - last filled 05/13/2022 90 DS at [facility] Pravastatin 40 mg - [facility]  [facility] [place] [place]Phone: [phone]: [phone] Uses pill box? {Yes or If no, why not?:20788} Pt endorses ***% compliance  We discussed:  {Pharmacy options:24294} [facility] Care Plan and Follow Up Patient Decision:  {FOLLOWUP:24991}  Plan: {CM FOLLOW UP PPUGG:16619} MJeni Salles PharmD, BAur[facility] Lakeland Med CtrClinical Pharmacist {MP practice sites:26434} [phone]

## 2022-07-20 NOTE — Telephone Encounter (Signed)
  Chronic Care Management   Outreach Note  07/20/2022 Name: Michaela Rhodes MRN: [id number] DOB: [birth year]  Referred by: [name], MD  Patient had a phone appointment scheduled with clinical pharmacist today.  An unsuccessful telephone outreach was attempted today. The patient was referred to the pharmacist for assistance with care management and care coordination.   If possible, a message was left to return call to: [phone] or to [facility] at [facility]: [place], PharmD, [place] Pharmacist [place] at [place]

## 2022-07-22 NOTE — Progress Notes (Signed)
Chronic Care Management Pharmacy Note  07/25/2022 Name:  Michaela Rhodes MRN:  [id number] DOB:  [birth year]  Summary: A1c not at goal < 7% Pt reports arthritis pain in her back  Recommendations/Changes made from today's visit: -Recommend repeat BMP and consider dose decrease of metformin -Recommended avoidance of oral NSAIDs given kidney function -Recommended use of Tylenol 650 mg or diclofenac gel for arthritis pain  Plan: Follow up on pain and DM assessment after PCP visit   Subjective: Michaela Rhodes is an 86 y.o. year old female who is a primary patient of Burchette, Alinda Sierras, MD.  The CCM team was consulted for assistance with disease management and care coordination needs.    Engaged with patient by telephone for follow up visit in response to provider referral for pharmacy case management and/or care coordination services.   Consent to Services:  The patient was given information about Chronic Care Management services, agreed to services, and gave verbal consent prior to initiation of services.  Please see initial visit note for detailed documentation.   Patient Care Team: [name], MD as PCP - General (Family Medicine) [name], MD (Family Medicine) Viona Gilmore, [facility] as Pharmacist (Pharmacist)  Recent office visits: 05/27/22 Rolene Arbour, LPN: Patient presented for AWV.  05/18/22 Carolann Littler, MD: Patient presented for chronic conditions follow up. Follow up in 3 months.  Recent consult visits: None  Hospital visits: None in previous 6 months   Objective:  Lab Results  Component Value Date   CREATININE 1.45 (H) 05/18/2022   BUN 37 (H) 05/18/2022   GFR 33.25 (L) 05/18/2022   GFRNONAA 38 (L) 11/29/2018   GFRAA 44 (L) 11/29/2018   NA 138 05/18/2022   K 4.4 05/18/2022   CALCIUM 9.9 05/18/2022   CO2 25 05/18/2022   GLUCOSE 157 (H) 05/18/2022    Lab Results  Component Value Date/Time   HGBA1C 7.8 (H) 05/18/2022 02:19 PM    HGBA1C 7.6 (A) 02/08/2022 11:04 AM   HGBA1C 7.4 (H) 11/25/2020 08:40 AM   GFR 33.25 (L) 05/18/2022 02:19 PM   GFR 31.99 (L) 02/08/2022 11:36 AM    Last diabetic Eye exam:  Lab Results  Component Value Date/Time   HMDIABEYEEXA No Retinopathy 06/06/2019 12:00 AM    Last diabetic Foot exam:  Lab Results  Component Value Date/Time   HMDIABFOOTEX normal 12/22/2014 12:00 AM     Lab Results  Component Value Date   CHOL 170 05/18/2022   HDL 43.80 05/18/2022   LDLCALC 93 11/25/2020   LDLDIRECT 89.0 05/18/2022   TRIG 259.0 (H) 05/18/2022   CHOLHDL 4 05/18/2022       Latest Ref Rng & Units 05/18/2022    2:19 PM 02/08/2022   11:36 AM 11/25/2020    8:40 AM  Hepatic Function  Total Protein 6.0 - 8.3 g/dL 7.1  6.9  6.6   Albumin 3.5 - 5.2 g/dL 4.2  4.1    AST 0 - 37 U/L '15  15  15   ' ALT 0 - 35 U/L '14  13  15   ' Alk Phosphatase 39 - 117 U/L 116  115    Total Bilirubin 0.2 - 1.2 mg/dL 0.4  0.4  0.5   Bilirubin, Direct 0.0 - 0.3 mg/dL 0.1   0.1     Lab Results  Component Value Date/Time   TSH 5.74 (H) 02/08/2022 11:36 AM   TSH 5.71 (H) 11/25/2020 08:40 AM       Latest Ref  Rng & Units 02/08/2022   11:36 AM 11/25/2020    8:40 AM 11/29/2018    5:18 PM  CBC  WBC 4.0 - 10.5 K/uL 8.1  9.6  22.4   Hemoglobin 12.0 - 15.0 g/dL 13.2  13.4  13.4   Hematocrit 36.0 - 46.0 % 41.2  41.3  43.8   Platelets 150.0 - 400.0 K/uL 279.0  335  309     No results found for: "VD25OH"  Clinical ASCVD: No  The ASCVD Risk score (Arnett DK, et al., 2019) failed to calculate for the following reasons:   The 2019 ASCVD risk score is only valid for ages 13 to 44       05/27/2022    3:54 PM 02/08/2022   11:33 AM 02/18/2021   11:38 AM  Depression screen PHQ 2/9  Decreased Interest 0 0 0  Down, Depressed, Hopeless 0 0 0  PHQ - 2 Score 0 0 0  Altered sleeping  0   Tired, decreased energy  0   Change in appetite  2   Feeling bad or failure about yourself   0   Trouble concentrating  0   Moving slowly or  fidgety/restless  0   Suicidal thoughts  0   PHQ-9 Score  2        Social History   Tobacco Use  Smoking Status Never  Smokeless Tobacco Never   BP Readings from Last 3 Encounters:  05/18/22 134/80  02/08/22 (!) 144/98  11/25/20 124/80   Pulse Readings from Last 3 Encounters:  05/18/22 (!) 102  02/08/22 95  08/26/20 (!) 101   Wt Readings from Last 3 Encounters:  05/27/22 225 lb (102.1 kg)  05/18/22 225 lb 3.2 oz (102.2 kg)  02/08/22 229 lb 14.4 oz (104.3 kg)   BMI Readings from Last 3 Encounters:  05/27/22 40.50 kg/m  05/18/22 40.53 kg/m  02/08/22 41.38 kg/m    Assessment/Interventions: Review of patient past medical history, allergies, medications, health status, including review of consultants reports, laboratory and other test data, was performed as part of comprehensive evaluation and provision of chronic care management services.   SDOH:  (Social Determinants of Health) assessments and interventions performed: Yes SDOH Interventions    Flowsheet Row Most Recent Value  SDOH Interventions   Financial Strain Interventions Intervention Not Indicated      SDOH Screenings   Alcohol Screen: Low Risk  (05/27/2022)   Alcohol Screen    Last Alcohol Screening Score (AUDIT): 0  Depression (PHQ2-9): Low Risk  (05/27/2022)   Depression (PHQ2-9)    PHQ-2 Score: 0  Financial Resource Strain: Low Risk  (07/25/2022)   Overall Financial Resource Strain (CARDIA)    Difficulty of Paying Living Expenses: Not hard at all  Food Insecurity: No Food Insecurity (05/27/2022)   Hunger Vital Sign    Worried About Running Out of Food in the Last Year: Never true    Ran Out of Food in the Last Year: Never true  Housing: Low Risk  (05/27/2022)   Housing    Last Housing Risk Score: 0  Physical Activity: Inactive (05/27/2022)   Exercise Vital Sign    Days of Exercise per Week: 0 days    Minutes of Exercise per Session: 0 min  Social Connections: Moderately Isolated (05/27/2022)   Social  Connection and Isolation Panel [NHANES]    Frequency of Communication with Friends and Family: More than three times a week    Frequency of Social Gatherings with Friends and Family:  More than three times a week    Attends Religious Services: Never    Active Member of Clubs or Organizations: Yes    Attends Archivist Meetings: More than 4 times per year    Marital Status: Widowed  Stress: No Stress Concern Present (05/27/2022)   [place]    Feeling of Stress : Not at all  Tobacco Use: Low Risk  (05/27/2022)   Patient History    Smoking Tobacco Use: Never    Smokeless Tobacco Use: Never    Passive Exposure: Not on file  Transportation Needs: No Transportation Needs (05/27/2022)   PRAPARE - Transportation    Lack of Transportation (Medical): No    Lack of Transportation (Non-Medical): No    CCM Care Plan  Allergies  Allergen Reactions   Penicillins Anaphylaxis and Hives    Has patient had a PCN reaction causing immediate rash, facial/tongue/throat swelling, SOB or lightheadedness with hypotension: Yes Has patient had a PCN reaction causing severe rash involving mucus membranes or skin necrosis: Yes Has patient had a PCN reaction that required hospitalization: No Has patient had a PCN reaction occurring within the last 10 years: Yes If all of the above answers are "NO", then may proceed with Cephalosporin use.    Keflex [Cephalexin]     Rash     Medications Reviewed Today     Reviewed by Viona Gilmore, [facility] (Pharmacist) on 07/25/22 at [phone]  Med List Status: <None>   Medication Order Taking? Sig Documenting Provider Last Dose Status Informant  amLODipine (NORVASC) 5 MG tablet [id number] No Take 1 tablet (5 mg total) by mouth daily. [name], MD Taking Active   apixaban (ELIQUIS) 2.5 MG TABS tablet [id number] No Take 1 tablet (2.5 mg total) by mouth 2 (two) times daily. [name], MD  Taking Active   Blood Glucose Monitoring Suppl (ACCU-CHEK GUIDE) w/Device KIT [id number] No 1 Device by Other route daily. Use to test blood sugar once daily. Dx Code D32.20 [name], MD Taking Active   calcium-vitamin D (OSCAL WITH D) 500-200 MG-UNIT per tablet [id number] No Take 1 tablet by mouth daily. Marchia Bond, MD Taking Active Self  glipiZIDE (GLUCOTROL) 5 MG tablet [id number] No Take 0.5 tablets (2.5 mg total) by mouth daily before breakfast. [name], MD Taking Active   glucose blood (ACCU-CHEK GUIDE) test strip [id number] No Use as instructed to test blood sugar once daily. Dx Code G62.69 [name], MD Taking Active   insulin glargine, 2 Unit Dial, (TOUJEO MAX SOLOSTAR) 300 UNIT/ML Solostar Pen [id number] No Inject 24 Units into the skin daily. Burchette, Alinda Sierras, MD Taking Active   Insulin Pen Needle (BD PEN NEEDLE NANO U/F) 32G X 4 MM MISC [id number] No Use to inject insulin once daily. Dx Code E11.65 [name], MD Taking Active   levothyroxine (SYNTHROID) 137 MCG tablet [id number] No Take 1 tablet (137 mcg total) by mouth daily before breakfast. [name], MD Taking Active   lisinopril-hydrochlorothiazide (ZESTORETIC) 20-25 MG tablet [id number] No Take 1 tablet by mouth daily. [name], MD Taking Active   metFORMIN (GLUCOPHAGE-XR) 750 MG 24 hr tablet [id number]  TAKE 1 TABLET BY MOUTH TWICE DAILY AT  10AM  AND  5PM Burchette, Alinda Sierras, MD  Active   metoprolol tartrate (LOPRESSOR) 50 MG tablet [id number] No Take 1 tablet (50 mg total) by mouth 2 (two) times daily. [name], MD  Taking Active   pravastatin (PRAVACHOL) 40 MG tablet [id number] No Take 1 tablet (40 mg total) by mouth daily. [name], MD Taking Active             Patient Active Problem List   Diagnosis Date Noted   Lumbar radiculopathy 02/12/2020   Acute deep vein thrombosis (DVT) of left lower extremity ([place]) 11/29/2019   S/P thoracentesis    Acute  diastolic CHF (congestive heart failure) ([place])    AKI (acute kidney injury) ([place])    CAP (community acquired pneumonia) 07/10/2017   Acute respiratory failure ([place]) 07/10/2017   Diabetes mellitus with complication (HCC) 74/07/1447   Pleural effusion on left 07/10/2017   Lactic acidemia 07/10/2017   Sepsis, unspecified organism ([place]) 07/10/2017   Osteopenia 04/02/2016   Other specified hypothyroidism    Acute renal failure syndrome ([place])    Cellulitis 12/27/2014   Cellulitis of left lower extremity 12/26/2014   Obesity (BMI 30-39.9) 08/30/2013   Hyperlipidemia 09/25/2012   Breast cancer, left ([place]) 07/26/2012   Syncope 06/26/2012   Type 2 diabetes mellitus, uncontrolled 05/30/2012   Acute renal failure ([place]) 05/30/2012   HTN (hypertension) 05/30/2012   Postoperative anemia due to acute blood loss 05/29/2012   Tachycardia 05/27/2012   Closed fracture of right proximal humerus 05/26/2012   OVERWEIGHT 10/19/2009   Hypothyroidism 10/16/2009   Benign essential HTN 10/16/2009   SVT (supraventricular tachycardia) ([place]) 10/16/2009    Immunization History  Administered Date(s) Administered   Fluad Quad(high Dose 65+) 08/26/2020   Influenza Split 09/25/2012   Influenza,inj,Quad PF,6+ Mos 08/30/2013, 12/22/2014   PFIZER(Purple Top)SARS-COV-2 Vaccination 01/03/2020, 01/24/2020, 12/08/2020   Pneumococcal Conjugate-13 11/25/2020   Pneumococcal Polysaccharide-23 09/25/2012   Tdap 12/26/2009   Patient denies any issues right now. She is limited by her pain in her back right above her hips. She does use Tylenol and ibuprofen and alternates them. She isn't worried if this affects her organs as she feels like she will outlive them. Patient does some walking at home but no strenuous exercise. She does belong to the [facility] but does not go very often. She is worried about falling in the pool area because it is slippery but will try to get her granddaughter to go with her.   Conditions to be  addressed/monitored:  Hypertension, Hyperlipidemia, Diabetes, Hypothyroidism, Osteopenia, and Osteoarthritis  Conditions addressed this visit: Hypertension, diabetes, osteoarthritis       Care Plan : CCM Pharmacy Care Plan  Updates made by Viona Gilmore, [place] since 07/25/2022 12:00 AM     Problem: Problem: Hypertension, Hyperlipidemia, Diabetes, Hypothyroidism, Osteopenia, and Osteoarthritis      Long-Range Goal: Patient-Specific Goal   Start Date: 07/25/2022  Expected End Date: 07/26/2023  This Visit's Progress: On track  Priority: High  Note:   Current Barriers:  Unable to independently monitor therapeutic efficacy Unable to achieve control of diabetes   Pharmacist Clinical Goal(s):  Patient will achieve adherence to monitoring guidelines and medication adherence to achieve therapeutic efficacy achieve control of diabetes as evidenced by next A1c  through collaboration with PharmD and provider.   Interventions: 1:1 collaboration with [name], MD regarding development and update of comprehensive plan of care as evidenced by provider attestation and co-signature Inter-disciplinary care team collaboration (see longitudinal plan of care) Comprehensive medication review performed; medication list updated in electronic medical record  Hypertension (BP goal <140/90) -Controlled -Current treatment: Amlodipine 5 mg 1 tablet daily - Appropriate, Effective, Safe, Accessible Lisinopril-HCTZ 20-25 mg 1  tablet daily - Appropriate, Effective, Safe, Accessible Metoprolol tartrate 50 mg 1 tablet twice daily - Appropriate, Effective, Safe, Accessible -Medications previously tried: unknown  -Current home readings: doesn't write it down; has brought to our office to make sure it's accurate - wrist cuff; usually 130/80s -Current dietary habits: n/a -Current exercise habits: walking some but limited with back pain -Denies hypotensive/hypertensive symptoms -Educated on BP goals and  benefits of medications for prevention of heart attack, stroke and kidney damage; Importance of home blood pressure monitoring; Proper BP monitoring technique; -Counseled to monitor BP at home weekly, document, and provide log at future appointments -Counseled on diet and exercise extensively Recommended to continue current medication  Hyperlipidemia: (LDL goal < 100, TG goal < 130 ) -Uncontrolled TG; LDL controlled  -Current treatment: Pravastatin 40 mg 1 tablet daily - Appropriate, Query effective, Safe, Accessible -Medications previously tried: none  -Current dietary patterns: limiting bread -Current exercise habits: walking some but back pain is limited -Educated on Cholesterol goals;  Importance of limiting foods high in cholesterol; -Counseled on diet and exercise extensively Recommended to continue current medication  Diabetes (A1c goal <7%) -Uncontrolled -Current medications: Metformin XR 750 mg 1 tablet twice daily - Appropriate, Query effective, Query Safe, Accessible Glipizide 5 mg 1/2 tablet at breakfast - Appropriate, Query effective, Safe, Accessible Toujeo U-300 inject 24 units once daily - Appropriate, Query effective, Safe, Accessible -Medications previously tried: n/a  -Current home glucose readings fasting glucose: 100, lowest of 90, highest of 110 post prandial glucose: does not check -Denies hypoglycemic/hyperglycemic symptoms -Current meal patterns: cutting back on bread breakfast: n/a  lunch: n/a  dinner: n/a snacks: n/a drinks: n/a -Current exercise: doesn't exercise enough but back really bothers her; walking in the house and around it and someone is in the house when she does; takes her about 20 minutes and about twice a day; belongs to the [facility] and is worried about falling -Educated on A1c and blood sugar goals; Complications of diabetes including kidney damage, retinal damage, and cardiovascular disease; Benefits of routine self-monitoring of blood  sugar; Continuous glucose monitoring; -Counseled to check feet daily and get yearly eye exams -Counseled on diet and exercise extensively Recommended to continue current medication Recommended repeat BMP and consider decreasing metformin dose.  Osteopenia (Goal prevent fractures) -Not ideally controlled -Last DEXA Scan: 2017             T-Score femoral neck: -1.4  T-Score total hip: n/a  T-Score lumbar spine: -0.6  T-Score forearm radius: n/a  10-year probability of major osteoporotic fracture: 11.5%  10-year probability of hip fracture: 2.4% -Patient is not a candidate for pharmacologic treatment -Current treatment  Calcium 500-200 mg-unit 1 tablet daily - Appropriate, Query effective, Safe, Accessible -Medications previously tried: n/a  -Recommend [phone] units of vitamin D daily. Recommend 1200 mg of calcium daily from dietary and supplemental sources. Recommend weight-bearing and muscle strengthening exercises for building and maintaining bone density. -Counseled on diet and exercise extensively  Hypothyroidism (Goal: TSH 2.5-4.5) -Uncontrolled -Current treatment  Levothyroxine 137 mcg 1 tablet daily - Appropriate, Query effective, Safe, Accessible -Medications previously tried: n/a  -Recommended repeat TSH or dose adjustment.  History of DVT (Goal: prevent blood clots) -Controlled -Current treatment  Eliquis 2.5 mg 1 tablet twice daily - Appropriate, Effective, Safe, Accessible -Medications previously tried: n/a  -Recommended to continue current medication   Health Maintenance -Vaccine gaps: shingrix (has not had chicken pox), COVID booster, tetanus, influenza -Current therapy:  No medications -Educated on Cost  vs benefit of each product must be carefully weighed by individual consumer -Patient is satisfied with current therapy and denies issues -Recommended to continue as is.  Patient Goals/Self-Care Activities Patient will:  - take medications as prescribed as  evidenced by patient report and record review check glucose different times of day, document, and provide at future appointments check blood pressure weekly, document, and provide at future appointments  Follow Up Plan: The care management team will reach out to the patient again over the next 60 days.        Medication Assistance: None required.  Patient affirms current coverage meets needs.  Compliance/Adherence/Medication fill history: Care Gaps: COVID booster, eye exam, foot exam, influenza vaccine, shingrix (did not have the chicken pox), tetanus  Last BP - 134/80 on 05/18/2022 Last A1C - 7.8 on 05/18/2022  Star-Rating Drugs: Glipizide 5 mg - last filled 05/13/2022 90 DS at [facility] Lisinopril HCTZ 20/7m - last filled 05/13/2022 90 DS at [facility] Pravastatin 40 mg - [facility]:  [facility] [id number]: [phone]: [phone] Uses pill box? Yes Pt endorses 99% compliance  We discussed: Current pharmacy is preferred with insurance plan and patient is satisfied with pharmacy services Patient decided to: Continue current medication management strategy  Care Plan and Follow Up Patient Decision:  Patient agrees to Care Plan and Follow-up.  Plan: The care management team will reach out to the patient again over the next 60 days.  MJeni Salles PharmD, BLuquilloPharmacist [place]ustinat [place]

## 2022-07-25 ENCOUNTER — Ambulatory Visit (INDEPENDENT_AMBULATORY_CARE_PROVIDER_SITE_OTHER): Payer: Medicare HMO | Admitting: Pharmacist

## 2022-07-25 DIAGNOSIS — E118 Type 2 diabetes mellitus with unspecified complications: Secondary | ICD-10-CM

## 2022-07-25 DIAGNOSIS — I10 Essential (primary) hypertension: Secondary | ICD-10-CM

## 2022-07-25 NOTE — Patient Instructions (Signed)
Hi Elize,  It was great to catch up with you again! Go ahead and try to use the arthritis strength Tylenol (650 mg) to see if that helps more with your back pain. Also, give the Voltaren (diclofenac) gel a try to see if this helps with your pain. The reason Dr. Elease Hashimoto wanted you to limit use of NSAIDs (ibuprofen, Advil, Aleve, etc.) is because we don't want to do any harm to your kidneys.  Don't forget to schedule a follow up with Dr. Elease Hashimoto in September as well.  Please reach out to me if you have any questions or need anything before our follow up!  Best, Maddie  Jeni Salles, PharmD, [facility]    Visit Information   Goals Addressed   None    Patient Care Plan: CCM Pharmacy Care Plan     Problem Identified: Problem: Hypertension, Hyperlipidemia, Diabetes, Hypothyroidism, Osteopenia, and Osteoarthritis      Long-Range Goal: Patient-Specific Goal   Start Date: 07/25/2022  Expected End Date: 07/26/2023  This Visit's Progress: On track  Priority: High  Note:   Current Barriers:  Unable to independently monitor therapeutic efficacy Unable to achieve control of diabetes   Pharmacist Clinical Goal(s):  Patient will achieve adherence to monitoring guidelines and medication adherence to achieve therapeutic efficacy achieve control of diabetes as evidenced by next A1c  through collaboration with PharmD and provider.   Interventions: 1:1 collaboration with [name], MD regarding development and update of comprehensive plan of care as evidenced by provider attestation and co-signature Inter-disciplinary care team collaboration (see longitudinal plan of care) Comprehensive medication review performed; medication list updated in electronic medical record  Hypertension (BP goal <140/90) -Controlled -Current treatment: Amlodipine 5 mg 1 tablet daily - Appropriate, Effective, Safe,  Accessible Lisinopril-HCTZ 20-25 mg 1 tablet daily - Appropriate, Effective, Safe, Accessible Metoprolol tartrate 50 mg 1 tablet twice daily - Appropriate, Effective, Safe, Accessible -Medications previously tried: unknown  -Current home readings: doesn't write it down; has brought to our office to make sure it's accurate - wrist cuff; usually 130/80s -Current dietary habits: n/a -Current exercise habits: walking some but limited with back pain -Denies hypotensive/hypertensive symptoms -Educated on BP goals and benefits of medications for prevention of heart attack, stroke and kidney damage; Importance of home blood pressure monitoring; Proper BP monitoring technique; -Counseled to monitor BP at home weekly, document, and provide log at future appointments -Counseled on diet and exercise extensively Recommended to continue current medication  Hyperlipidemia: (LDL goal < 100, TG goal < 130 ) -Uncontrolled TG; LDL controlled  -Current treatment: Pravastatin 40 mg 1 tablet daily - Appropriate, Query effective, Safe, Accessible -Medications previously tried: none  -Current dietary patterns: limiting bread -Current exercise habits: walking some but back pain is limited -Educated on Cholesterol goals;  Importance of limiting foods high in cholesterol; -Counseled on diet and exercise extensively Recommended to continue current medication  Diabetes (A1c goal <7%) -Uncontrolled -Current medications: Metformin XR 750 mg 1 tablet twice daily - Appropriate, Query effective, Query Safe, Accessible Glipizide 5 mg 1/2 tablet at breakfast - Appropriate, Query effective, Safe, Accessible Toujeo U-300 inject 24 units once daily - Appropriate, Query effective, Safe, Accessible -Medications previously tried: n/a  -Current home glucose readings fasting glucose: 100, lowest of 90, highest of 110 post prandial glucose: does not check -Denies hypoglycemic/hyperglycemic symptoms -Current meal patterns:  cutting back on bread breakfast: n/a  lunch: n/a  dinner: n/a snacks: n/a drinks: n/a -Current  exercise: doesn't exercise enough but back really bothers her; walking in the house and around it and someone is in the house when she does; takes her about 20 minutes and about twice a day; belongs to the [facility] [employer] [facility] [employer] and is worried about falling -Educated on A1c and blood sugar goals; Complications of diabetes including kidney damage, retinal damage, and cardiovascular disease; Benefits of routine self-monitoring of blood sugar; Continuous glucose monitoring; -Counseled to check feet daily and get yearly eye exams -Counseled on diet and exercise extensively Recommended to continue current medication Recommended repeat BMP and consider decreasing metformin dose.  Osteopenia (Goal prevent fractures) -Not ideally controlled -Last DEXA Scan: 2017             T-Score femoral neck: -1.4  T-Score total hip: n/a  T-Score lumbar spine: -0.6  T-Score forearm radius: n/a  10-year probability of major osteoporotic fracture: 11.5%  10-year probability of hip fracture: 2.4% -Patient is not a candidate for pharmacologic treatment -Current treatment  Calcium 500-200 mg-unit 1 tablet daily - Appropriate, Query effective, Safe, Accessible -Medications previously tried: n/a  -Recommend [phone] units of vitamin D daily. Recommend 1200 mg of calcium daily from dietary and supplemental sources. Recommend weight-bearing and muscle strengthening exercises for building and maintaining bone density. -Counseled on diet and exercise extensively  Hypothyroidism (Goal: TSH 2.5-4.5) -Uncontrolled -Current treatment  Levothyroxine 137 mcg 1 tablet daily - Appropriate, Query effective, Safe, Accessible -Medications previously tried: n/a  -Recommended repeat TSH or dose adjustment.  History of DVT (Goal: prevent blood clots) -Controlled -Current treatment  Eliquis 2.5 mg 1 tablet twice daily - Appropriate, Effective,  Safe, Accessible -Medications previously tried: n/a  -Recommended to continue current medication   Health Maintenance -Vaccine gaps: shingrix (has not had chicken pox), COVID booster, tetanus, influenza -Current therapy:  No medications -Educated on Cost vs benefit of each product must be carefully weighed by individual consumer -Patient is satisfied with current therapy and denies issues -Recommended to continue as is.  Patient Goals/Self-Care Activities Patient will:  - take medications as prescribed as evidenced by patient report and record review check glucose different times of day, document, and provide at future appointments check blood pressure weekly, document, and provide at future appointments  Follow Up Plan: The care management team will reach out to the patient again over the next 60 days.        Patient verbalizes understanding of instructions and care plan provided today and agrees to view in [place]. Active MyChart status and patient understanding of how to access instructions and care plan via MyChart confirmed with patient.    The pharmacy team will reach out to the patient again over the next 60 days.   Viona Gilmore, [facility]

## 2022-07-25 NOTE — Progress Notes (Signed)
Noted.  Does need adjustment of metformin and repeat basic metabolic panel.  Make sure she schedules follow-up by next month.  Would avoid ibuprofen altogether if possible

## 2022-08-18 DIAGNOSIS — Z794 Long term (current) use of insulin: Secondary | ICD-10-CM

## 2022-08-18 DIAGNOSIS — E1159 Type 2 diabetes mellitus with other circulatory complications: Secondary | ICD-10-CM

## 2022-08-18 DIAGNOSIS — I10 Essential (primary) hypertension: Secondary | ICD-10-CM

## 2022-08-18 DIAGNOSIS — E785 Hyperlipidemia, unspecified: Secondary | ICD-10-CM | POA: Diagnosis not present

## 2022-08-23 ENCOUNTER — Other Ambulatory Visit: Payer: Self-pay | Admitting: Family Medicine

## 2022-08-24 ENCOUNTER — Encounter: Payer: Self-pay | Admitting: Family Medicine

## 2022-08-26 DIAGNOSIS — M1712 Unilateral primary osteoarthritis, left knee: Secondary | ICD-10-CM | POA: Diagnosis not present

## 2022-08-26 DIAGNOSIS — M25562 Pain in left knee: Secondary | ICD-10-CM | POA: Diagnosis not present

## 2022-09-09 ENCOUNTER — Telehealth: Payer: Self-pay | Admitting: Pharmacist

## 2022-09-09 NOTE — Chronic Care Management (AMB) (Signed)
    Chronic Care Management Pharmacy Assistant   Name: Michaela Rhodes  MRN: [id number] DOB: Aug 23, 1936  Reason for Encounter: Follow up call  Spoke with patient to follow up blood pressures and blood sugars. She states she has been checking blood pressure more often, she has not written them down and doesn't recall the actual readings, she states they were good readings.  She has been checking blood sugars at different times of the day, she has not been writing these down, she states todays blood sugar at 12:40 was 110 4 hrs pp and yesterdays blood sugar at 12:00 was 150 4 hrs pp. She does not recall the previous readings or what time of day she checked her blood sugars.  Patient was encouraged to get a notebook and start writing down the date, time and what the readings are, she was also encourage to make a note of the times she eats. Patient agree's  Remind patient to schedule follow up with Dr. Sarajane Jews. She agree's.   [place] Pharmacist Assistant [phone]

## 2022-09-27 ENCOUNTER — Telehealth: Payer: Self-pay | Admitting: *Deleted

## 2022-09-27 NOTE — Patient Outreach (Signed)
  Care Coordination   09/27/2022 Name: Michaela Rhodes MRN: [id number] DOB: 12-Feb-1936   Care Coordination Outreach Attempts:  An unsuccessful telephone outreach was attempted today to offer the patient information about available care coordination services as a benefit of their health plan.   Follow Up Plan:  Additional outreach attempts will be made to offer the patient care coordination information and services.   Encounter Outcome:  No Answer  Care Coordination Interventions Activated:  No   Care Coordination Interventions:  No, not indicated    Raina Mina, RN Care Management Coordinator [place] Office [phone]

## 2022-09-30 ENCOUNTER — Telehealth: Payer: Self-pay | Admitting: *Deleted

## 2022-09-30 NOTE — Patient Outreach (Signed)
  Care Coordination   09/30/2022 Name: Michaela Rhodes MRN: [id number] DOB: 1936/04/21   Care Coordination Outreach Attempts:  A second unsuccessful outreach was attempted today to offer the patient with information about available care coordination services as a benefit of their health plan.     Follow Up Plan:  Additional outreach attempts will be made to offer the patient care coordination information and services.   Encounter Outcome:  No Answer  Care Coordination Interventions Activated:  No   Care Coordination Interventions:  No, not indicated     Raina Mina, RN Care Management Coordinator [place] Office [phone]

## 2022-10-10 ENCOUNTER — Telehealth: Payer: Self-pay | Admitting: *Deleted

## 2022-10-10 NOTE — Patient Outreach (Signed)
  Care Coordination   10/10/2022 Name: Michaela Rhodes MRN: [id number] DOB: [birth year]   Care Coordination Outreach Attempts:  A third unsuccessful outreach was attempted today to offer the patient with information about available care coordination services as a benefit of their health plan.   Follow Up Plan:  No further outreach attempts will be made at this time. We have been unable to contact the patient to offer or enroll patient in care coordination services  Encounter Outcome:  No Answer  Care Coordination Interventions Activated:  No   Care Coordination Interventions:  No, not indicated    Raina Mina, RN Care Management Coordinator [place] Office [phone]

## 2022-10-15 ENCOUNTER — Other Ambulatory Visit: Payer: Self-pay | Admitting: Family Medicine

## 2022-10-17 NOTE — Telephone Encounter (Signed)
Pt called to say she needed a refill, but was now not sure which one she needed:  insulin glargine, 2 Unit Dial, (TOUJEO MAX SOLOSTAR) 300 UNIT/ML Solostar Pen  Insulin Pen Needle (BD PEN NEEDLE NANO U/F) 32G X 4 MM MISC  Pt is asking if MD could decide which one she needs?   LOV:  05/10/22  [place], [place] [address] Phone:  [phone]  Fax:  [phone]

## 2022-10-17 NOTE — Telephone Encounter (Signed)
Patient called to check on progress of this refill insulin glargine, 2 Unit Dial, (TOUJEO MAX SOLOSTAR) 300 UNIT/ML Solostar Pen

## 2022-11-04 ENCOUNTER — Telehealth: Payer: Self-pay | Admitting: Pharmacist

## 2022-11-04 NOTE — Progress Notes (Unsigned)
Chronic Care Management Pharmacy Assistant   Name: Michaela Rhodes  MRN: [id number] DOB: 04/15/1936  Reason for Encounter: Disease State / Hypertension and Diabetes Assessment Call   Conditions to be addressed/monitored: HTN and DMII  Recent office visits:  08/18/2022 Carolann Littler MD - Patient was seen for Type 2 diabetes mellitus with other circulatory complications and additional issues. No additional chart notes.   05/18/2022 Carolann Littler MD - Patient was seen for benign essential HTN and additional concerns. No medication changes. Follow up in 3 months.   Recent consult visits:  08/26/2022 Rhina Brackett (family med) - Patient was seen for Unilateral primary osteoarthritis, left knee  and pain in left knee. No additional chart notes.   Hospital visits:  None  Medications: Outpatient Encounter Medications as of 11/04/2022  Medication Sig   amLODipine (NORVASC) 5 MG tablet Take 1 tablet (5 mg total) by mouth daily.   apixaban (ELIQUIS) 2.5 MG TABS tablet Take 1 tablet (2.5 mg total) by mouth 2 (two) times daily.   Blood Glucose Monitoring Suppl (ACCU-CHEK GUIDE) w/Device KIT 1 Device by Other route daily. Use to test blood sugar once daily. Dx Code E11.65   calcium-vitamin D (OSCAL WITH D) 500-200 MG-UNIT per tablet Take 1 tablet by mouth daily.   glipiZIDE (GLUCOTROL) 5 MG tablet Take 0.5 tablets (2.5 mg total) by mouth daily before breakfast.   glucose blood (ACCU-CHEK GUIDE) test strip Use as instructed to test blood sugar once daily. Dx Code E11.65   Insulin Pen Needle (BD PEN NEEDLE NANO U/F) 32G X 4 MM MISC Use to inject insulin once daily. Dx Code E11.65   levothyroxine (SYNTHROID) 137 MCG tablet Take 1 tablet (137 mcg total) by mouth daily before breakfast.   lisinopril-hydrochlorothiazide (ZESTORETIC) 20-25 MG tablet Take 1 tablet by mouth daily.   metFORMIN (GLUCOPHAGE-XR) 750 MG 24 hr tablet TAKE 1 TABLET BY MOUTH TWICE DAILY AT  10AM  AND  5PM.  PATIENT  NEEDS   APPOINTMENT  WITH  PCP   metoprolol tartrate (LOPRESSOR) 50 MG tablet Take 1 tablet (50 mg total) by mouth 2 (two) times daily.   pravastatin (PRAVACHOL) 40 MG tablet Take 1 tablet (40 mg total) by mouth daily.   TOUJEO MAX SOLOSTAR 300 UNIT/ML Solostar Pen INJECT 24 UNITS SUBCUTANEOUSLY ONCE DAILY   No facility-administered encounter medications on file as of 11/04/2022.  Fill History:   Dispensed Days Supply Quantity Provider Pharmacy  AMLODIPINE 5MG TAB 08/23/2022 90 90 each      Dispensed Days Supply Quantity Provider Pharmacy  ELIQUIS 2.5MG       TAB 08/23/2022 90 180 each      Dispensed Days Supply Quantity Provider Pharmacy  GlipiZIDE 5MG       TAB 08/23/2022 90 45 each      Dispensed Days Supply Quantity Provider Pharmacy  TOUJEO MAX SOLO 300IU/ML INJ 10/17/2022 76 6 mL      Dispensed Days Supply Quantity Provider Pharmacy  LEVOTHYROXIN 137MCG TAB 08/16/2022 90 90 each      Dispensed Days Supply Quantity Provider Pharmacy  LISINOPRIL/HCTZ 20-25MG TAB 08/23/2022 90 90 each      Dispensed Days Supply Quantity Provider Pharmacy  METFORMIN ER 750MG  TAB 09/06/2022 90 180 each      Dispensed Days Supply Quantity Provider Pharmacy  METOPROLOL TART 50MG TAB 09/06/2022 90 180 each      Dispensed Days Supply Quantity Provider Pharmacy  PRAVASTATIN 40MG    TAB 08/23/2022 90 90 each  Reviewed chart prior to disease state call. Spoke with patient regarding BP  Recent Office Vitals: BP Readings from Last 3 Encounters:  05/18/22 134/80  02/08/22 (!) 144/98  11/25/20 124/80   Pulse Readings from Last 3 Encounters:  05/18/22 (!) 102  02/08/22 95  08/26/20 (!) 101    Wt Readings from Last 3 Encounters:  05/27/22 225 lb (102.1 kg)  05/18/22 225 lb 3.2 oz (102.2 kg)  02/08/22 229 lb 14.4 oz (104.3 kg)     Kidney Function Lab Results  Component Value Date/Time   CREATININE 1.45 (H) 05/18/2022 02:19 PM   CREATININE 1.50 (H) 02/08/2022 11:36 AM   CREATININE 1.24 (H)  11/25/2020 08:40 AM   GFR 33.25 (L) 05/18/2022 02:19 PM   GFRNONAA 38 (L) 11/29/2018 05:18 PM   GFRAA 44 (L) 11/29/2018 05:18 PM       Latest Ref Rng & Units 05/18/2022    2:19 PM 02/08/2022   11:36 AM 11/25/2020    8:40 AM  BMP  Glucose 70 - 99 mg/dL 157  225  124   BUN 6 - 23 mg/dL 37  37  25   Creatinine 0.40 - 1.20 mg/dL 1.45  1.50  1.24   BUN/Creat Ratio 6 - 22 (calc)   20   Sodium 135 - 145 mEq/L 138  137  143   Potassium 3.5 - 5.1 mEq/L 4.4  4.6  4.7   Chloride 96 - 112 mEq/L 102  101  106   CO2 19 - 32 mEq/L _0 Calcium 8.4 - 10.5 mg/dL 9.9  9.5  9.6    Recent Relevant Labs: Lab Results  Component Value Date/Time   HGBA1C 7.8 (H) 05/18/2022 02:19 PM   HGBA1C 7.6 (A) 02/08/2022 11:04 AM   HGBA1C 7.4 (H) 11/25/2020 08:40 AM    Kidney Function Lab Results  Component Value Date/Time   CREATININE 1.45 (H) 05/18/2022 02:19 PM   CREATININE 1.50 (H) 02/08/2022 11:36 AM   CREATININE 1.24 (H) 11/25/2020 08:40 AM   GFR 33.25 (L) 05/18/2022 02:19 PM   GFRNONAA 38 (L) 11/29/2018 05:18 PM   GFRAA 44 (L) 11/29/2018 05:18 PM   Current antihypertensive regimen:  Amlodipine 5 mg daily Lisinopril HCTZ 20/25 daily Metoprolol 50 mg twice daily  Current antihyperglycemic regimen:  Glipizide 5 mg 1/2 tablet daily Metformin 750 mg twice daily Toujeo Solostar 300 unit/ML inject 24 units daily  How often are you checking your Blood Pressure? {CHL HP BP Monitoring [phone]}  Current home BP readings: ***  What diet changes have been made to improve Blood Pressure Control?  Patient tries no to eat white foods Breakfast - patient will have a coffee and occasionally have a half bagel with cheese Lunch - patient will have a piece of fruit Dinner - patient will have a meat and vegetable  What exercise is being done to improve your Blood Pressure Control?  Patient will walk around the house throughout the day.   Patient {reports/denies:24182} hypoglycemic  symptoms, including {Hypoglycemic Symptoms:3049003}   Patient {reports/denies:24182} hyperglycemic symptoms, including {symptoms; hyperglycemia:17903}  How often are you checking your blood sugar? {BG Testing frequency:23922}  What are your blood sugars ranging?  Fasting: *** Before meals: *** After meals: *** Bedtime: ***  During the week, how often does your blood glucose drop below 70? {LowBGfrequency:24142}  Are you checking your feet daily/regularly? ***  Adherence Review: Is the patient currently on a STATIN medication? Yes Is the patient currently on  ACE/ARB medication? Yes Does the patient have >5 day gap between last estimated fill dates? No  Care Gaps: AWV - scheduled 06/09/2023 Last BP - 134/80 on 04/30/2022 Last A1C - 7.8 on 04/30/2022 Urine ACR - never done Shingrix - never done Covid - overdue Eye exam - overdue Foot exam - overdue Flu - due  Star Rating Drugs: Glipizide 5 mg - last filled 08/23/2022 90 DS at [facility] Lisinopril HCTZ 20/16m - [facility] 09/06/2022 90 DS at [facility] Pravastatin 40 mg - last filled 08/23/2022 [id number] [phone]

## 2022-11-08 NOTE — Chronic Care Management (AMB) (Signed)
Chronic Care Management Pharmacy Assistant   Name: Michaela Rhodes  MRN: [id number] DOB: [birth year]  Reason for Encounter: Disease State / Hypertension and Diabetes Assessment Call   Conditions to be addressed/monitored: HTN and DMII  Recent office visits:  08/18/2022 Carolann Littler MD - Patient was seen for Type 2 diabetes mellitus with other circulatory complications and additional issues. No additional chart notes.   05/18/2022 Carolann Littler MD - Patient was seen for benign essential HTN and additional concerns. No medication changes. Follow up in 3 months.   Recent consult visits:  08/26/2022 Rhina Brackett (family med) - Patient was seen for Unilateral primary osteoarthritis, left knee  and pain in left knee. No additional chart notes.   Hospital visits:  None  Medications: Outpatient Encounter Medications as of 11/04/2022  Medication Sig   amLODipine (NORVASC) 5 MG tablet Take 1 tablet (5 mg total) by mouth daily.   apixaban (ELIQUIS) 2.5 MG TABS tablet Take 1 tablet (2.5 mg total) by mouth 2 (two) times daily.   Blood Glucose Monitoring Suppl (ACCU-CHEK GUIDE) w/Device KIT 1 Device by Other route daily. Use to test blood sugar once daily. Dx Code E11.65   calcium-vitamin D (OSCAL WITH D) 500-200 MG-UNIT per tablet Take 1 tablet by mouth daily.   glipiZIDE (GLUCOTROL) 5 MG tablet Take 0.5 tablets (2.5 mg total) by mouth daily before breakfast.   glucose blood (ACCU-CHEK GUIDE) test strip Use as instructed to test blood sugar once daily. Dx Code E11.65   Insulin Pen Needle (BD PEN NEEDLE NANO U/F) 32G X 4 MM MISC Use to inject insulin once daily. Dx Code E11.65   levothyroxine (SYNTHROID) 137 MCG tablet Take 1 tablet (137 mcg total) by mouth daily before breakfast.   lisinopril-hydrochlorothiazide (ZESTORETIC) 20-25 MG tablet Take 1 tablet by mouth daily.   metFORMIN (GLUCOPHAGE-XR) 750 MG 24 hr tablet TAKE 1 TABLET BY MOUTH TWICE DAILY AT  10AM  AND  5PM.  PATIENT  NEEDS   APPOINTMENT  WITH  PCP   metoprolol tartrate (LOPRESSOR) 50 MG tablet Take 1 tablet (50 mg total) by mouth 2 (two) times daily.   pravastatin (PRAVACHOL) 40 MG tablet Take 1 tablet (40 mg total) by mouth daily.   TOUJEO MAX SOLOSTAR 300 UNIT/ML Solostar Pen INJECT 24 UNITS SUBCUTANEOUSLY ONCE DAILY   No facility-administered encounter medications on file as of 11/04/2022.  Fill History:   Dispensed Days Supply Quantity Provider Pharmacy  AMLODIPINE 5MG TAB 08/23/2022 90 90 each      Dispensed Days Supply Quantity Provider Pharmacy  ELIQUIS 2.5MG       TAB 08/23/2022 90 180 each      Dispensed Days Supply Quantity Provider Pharmacy  GlipiZIDE 5MG       TAB 08/23/2022 90 45 each      Dispensed Days Supply Quantity Provider Pharmacy  TOUJEO MAX SOLO 300IU/ML INJ 10/17/2022 76 6 mL      Dispensed Days Supply Quantity Provider Pharmacy  LEVOTHYROXIN 137MCG TAB 08/16/2022 90 90 each      Dispensed Days Supply Quantity Provider Pharmacy  LISINOPRIL/HCTZ 20-25MG TAB 08/23/2022 90 90 each      Dispensed Days Supply Quantity Provider Pharmacy  METFORMIN ER 750MG  TAB 09/06/2022 90 180 each      Dispensed Days Supply Quantity Provider Pharmacy  METOPROLOL TART 50MG TAB 09/06/2022 90 180 each      Dispensed Days Supply Quantity Provider Pharmacy  PRAVASTATIN 40MG    TAB 08/23/2022 90 90 each  Reviewed chart prior to disease state call. Spoke with patient regarding BP  Recent Office Vitals: BP Readings from Last 3 Encounters:  05/18/22 134/80  02/08/22 (!) 144/98  11/25/20 124/80   Pulse Readings from Last 3 Encounters:  05/18/22 (!) 102  02/08/22 95  08/26/20 (!) 101    Wt Readings from Last 3 Encounters:  05/27/22 225 lb (102.1 kg)  05/18/22 225 lb 3.2 oz (102.2 kg)  02/08/22 229 lb 14.4 oz (104.3 kg)     Kidney Function Lab Results  Component Value Date/Time   CREATININE 1.45 (H) 05/18/2022 02:19 PM   CREATININE 1.50 (H) 02/08/2022 11:36 AM   CREATININE 1.24 (H)  11/25/2020 08:40 AM   GFR 33.25 (L) 05/18/2022 02:19 PM   GFRNONAA 38 (L) 11/29/2018 05:18 PM   GFRAA 44 (L) 11/29/2018 05:18 PM       Latest Ref Rng & Units 05/18/2022    2:19 PM 02/08/2022   11:36 AM 11/25/2020    8:40 AM  BMP  Glucose 70 - 99 mg/dL 157  225  124   BUN 6 - 23 mg/dL 37  37  25   Creatinine 0.40 - 1.20 mg/dL 1.45  1.50  1.24   BUN/Creat Ratio 6 - 22 (calc)   20   Sodium 135 - 145 mEq/L 138  137  143   Potassium 3.5 - 5.1 mEq/L 4.4  4.6  4.7   Chloride 96 - 112 mEq/L 102  101  106   CO2 19 - 32 mEq/L _0 Calcium 8.4 - 10.5 mg/dL 9.9  9.5  9.6    Recent Relevant Labs: Lab Results  Component Value Date/Time   HGBA1C 7.8 (H) 05/18/2022 02:19 PM   HGBA1C 7.6 (A) 02/08/2022 11:04 AM   HGBA1C 7.4 (H) 11/25/2020 08:40 AM    Kidney Function Lab Results  Component Value Date/Time   CREATININE 1.45 (H) 05/18/2022 02:19 PM   CREATININE 1.50 (H) 02/08/2022 11:36 AM   CREATININE 1.24 (H) 11/25/2020 08:40 AM   GFR 33.25 (L) 05/18/2022 02:19 PM   GFRNONAA 38 (L) 11/29/2018 05:18 PM   GFRAA 44 (L) 11/29/2018 05:18 PM   Current antihypertensive regimen:  Amlodipine 5 mg daily Lisinopril HCTZ 20/25 daily Metoprolol 50 mg twice daily  Current antihyperglycemic regimen:  Glipizide 5 mg 1/2 tablet daily Metformin 750 mg twice daily Toujeo Solostar 300 unit/ML inject 24 units daily  How often are you checking your Blood Pressure?   Current home BP readings:   What diet changes have been made to improve Blood Pressure Control?   What exercise is being done to improve your Blood Pressure Control?    Patient hypoglycemic symptoms, including    Patient hyperglycemic symptoms, including   How often are you checking your blood sugar?   What are your blood sugars ranging?  Fasting:  Before meals:  After meals:  Bedtime:   During the week, how often does your blood glucose drop below 70?   Are you checking your feet daily/regularly?   Adherence  Review: Is the patient currently on a STATIN medication? Yes Is the patient currently on ACE/ARB medication? Yes Does the patient have >5 day gap between last estimated fill dates? No  Unable to reach patient after several attempts  Care Gaps: AWV - scheduled 06/09/2023 Last BP - 134/80 on 04/30/2022 Last A1C - 7.8 on 04/30/2022 Urine ACR - never done Shingrix - never done Covid - overdue Eye exam - overdue  Foot exam - overdue Flu - due  Star Rating Drugs: Glipizide 5 mg - last filled 08/23/2022 90 DS at [facility] Lisinopril HCTZ 20/42m - [facility] - [facility]/2022 90 DS at [facility] Pravastatin 40 mg - [facility] [facility] [place] [facility] [place] [phone]

## 2022-12-07 ENCOUNTER — Other Ambulatory Visit: Payer: Self-pay | Admitting: Family Medicine

## 2022-12-27 ENCOUNTER — Telehealth: Payer: Self-pay | Admitting: Pharmacist

## 2022-12-27 NOTE — Progress Notes (Signed)
Care Management & Coordination Services Pharmacy Team  Name: Michaela Rhodes  MRN: [id number] DOB: 02/18/1936  Reason for Encounter: Appointment Reminder  Contacted patient on 12/27/2022   Medications: Outpatient Encounter Medications as of 12/27/2022  Medication Sig   amLODipine (NORVASC) 5 MG tablet Take 1 tablet (5 mg total) by mouth daily.   apixaban (ELIQUIS) 2.5 MG TABS tablet Take 1 tablet (2.5 mg total) by mouth 2 (two) times daily.   Blood Glucose Monitoring Suppl (ACCU-CHEK GUIDE) w/Device KIT 1 Device by Other route daily. Use to test blood sugar once daily. Dx Code E11.65   calcium-vitamin D (OSCAL WITH D) 500-200 MG-UNIT per tablet Take 1 tablet by mouth daily.   glipiZIDE (GLUCOTROL) 5 MG tablet Take 0.5 tablets (2.5 mg total) by mouth daily before breakfast.   glucose blood (ACCU-CHEK GUIDE) test strip Use as instructed to test blood sugar once daily. Dx Code E11.65   Insulin Pen Needle (BD PEN NEEDLE NANO U/F) 32G X 4 MM MISC Use to inject insulin once daily. Dx Code E11.65   levothyroxine (SYNTHROID) 137 MCG tablet Take 1 tablet (137 mcg total) by mouth daily before breakfast.   lisinopril-hydrochlorothiazide (ZESTORETIC) 20-25 MG tablet Take 1 tablet by mouth daily.   metFORMIN (GLUCOPHAGE-XR) 750 MG 24 hr tablet TAKE 1 TABLET BY MOUTH TWICE DAILY AT 10 AM AND 5 PM. SCHEDULE APPT FOR FUTURE REFILLS   metoprolol tartrate (LOPRESSOR) 50 MG tablet Take 1 tablet (50 mg total) by mouth 2 (two) times daily.   pravastatin (PRAVACHOL) 40 MG tablet Take 1 tablet (40 mg total) by mouth daily.   TOUJEO MAX SOLOSTAR 300 UNIT/ML Solostar Pen INJECT 24 UNITS SUBCUTANEOUSLY ONCE DAILY   No facility-administered encounter medications on file as of 12/27/2022.   Lab Results  Component Value Date/Time   HGBA1C 7.8 (H) 05/18/2022 02:19 PM   HGBA1C 7.6 (A) 02/08/2022 11:04 AM   HGBA1C 7.4 (H) 11/25/2020 08:40 AM    BP Readings from Last 3 Encounters:  05/18/22 134/80  02/08/22 (!) 144/98   11/25/20 124/80    Unsuccessful attempt to reach patient. Left patient message reminding patient of appointment.  Unable to confirm telephone appointment with Jeni Salles PharmD, on 12/28/2022 at 11:00.  Care Gaps: AWV - scheduled 06/09/2023 Last BP - 134/80 on 04/30/2022 Last A1C - 7.8 on 04/30/2022 Urine ACR - never done Shingrix - never done Tdap - overdue Eye exam - overdue Foot exam - overdue Flu - overdue Covid - overdue HGA1C - overdue  Star Rating Drugs: Glipizide 5 mg - last filled 11/17/2022 90 DS at [facility] Lisinopril HCTZ 20/'25mg'$  - last filled 11/17/2022 90 DS at [facility] Metformin 750 mg - last filled 12/07/2022 90 DS at [facility] Pravastatin 40 mg - last filled 11/17/2022 90 DS at [place] [phone]

## 2022-12-28 ENCOUNTER — Ambulatory Visit: Payer: Medicare HMO | Admitting: Pharmacist

## 2022-12-28 DIAGNOSIS — I10 Essential (primary) hypertension: Secondary | ICD-10-CM

## 2022-12-28 DIAGNOSIS — E785 Hyperlipidemia, unspecified: Secondary | ICD-10-CM

## 2022-12-28 NOTE — Progress Notes (Signed)
Care Management & Coordination Services Pharmacy Note  12/28/2022 Name:  GALYA DUNNIGAN MRN:  [id number] DOB:  [birth year]  Summary: A1c not at goal < 7% Pt requested CGM monitoring   Recommendations/Changes made from today's visit: -Recommend repeat BMP and consider dose decrease of metformin -Recommended addition of Ozempic per patient request -Recommended Dexcom CGM monitoring   Follow up plan: Dexcom check in in 3-4 weeks  Subjective: Michaela Rhodes is an 87 y.o. year old female who is a primary patient of Burchette, Alinda Sierras, MD.  The care coordination team was consulted for assistance with disease management and care coordination needs.    Engaged with patient by telephone for follow up visit.  Patient Care Team: [name], MD as PCP - General (Family Medicine) [name], MD (Family Medicine) Viona Gilmore, [facility] as Pharmacist (Pharmacist)  Recent office visits: 05/27/22 Rolene Arbour, LPN: Patient presented for AWV.   05/18/22 Carolann Littler, MD: Patient presented for chronic conditions follow up. Follow up in 3 months.  Recent consult visits: 08/26/2022 Rhina Brackett (family med) - Patient was seen for Unilateral primary osteoarthritis, left knee  and pain in left knee. No additional chart notes.   Hospital visits: None in previous 6 months   Objective:  Lab Results  Component Value Date   CREATININE 1.45 (H) 05/18/2022   BUN 37 (H) 05/18/2022   GFR 33.25 (L) 05/18/2022   GFRNONAA 38 (L) 11/29/2018   GFRAA 44 (L) 11/29/2018   NA 138 05/18/2022   K 4.4 05/18/2022   CALCIUM 9.9 05/18/2022   CO2 25 05/18/2022   GLUCOSE 157 (H) 05/18/2022    Lab Results  Component Value Date/Time   HGBA1C 7.8 (H) 05/18/2022 02:19 PM   HGBA1C 7.6 (A) 02/08/2022 11:04 AM   HGBA1C 7.4 (H) 11/25/2020 08:40 AM   GFR 33.25 (L) 05/18/2022 02:19 PM   GFR 31.99 (L) 02/08/2022 11:36 AM    Last diabetic Eye exam:  Lab Results  Component Value Date/Time    HMDIABEYEEXA No Retinopathy 06/06/2019 12:00 AM    Last diabetic Foot exam:  Lab Results  Component Value Date/Time   HMDIABFOOTEX normal 12/22/2014 12:00 AM     Lab Results  Component Value Date   CHOL 170 05/18/2022   HDL 43.80 05/18/2022   LDLCALC 93 11/25/2020   LDLDIRECT 89.0 05/18/2022   TRIG 259.0 (H) 05/18/2022   CHOLHDL 4 05/18/2022       Latest Ref Rng & Units 05/18/2022    2:19 PM 02/08/2022   11:36 AM 11/25/2020    8:40 AM  Hepatic Function  Total Protein 6.0 - 8.3 g/dL 7.1  6.9  6.6   Albumin 3.5 - 5.2 g/dL 4.2  4.1    AST 0 - 37 U/L '15  15  15   '$ ALT 0 - 35 U/L '14  13  15   '$ Alk Phosphatase 39 - 117 U/L 116  115    Total Bilirubin 0.2 - 1.2 mg/dL 0.4  0.4  0.5   Bilirubin, Direct 0.0 - 0.3 mg/dL 0.1   0.1     Lab Results  Component Value Date/Time   TSH 5.74 (H) 02/08/2022 11:36 AM   TSH 5.71 (H) 11/25/2020 08:40 AM       Latest Ref Rng & Units 02/08/2022   11:36 AM 11/25/2020    8:40 AM 11/29/2018    5:18 PM  CBC  WBC 4.0 - 10.5 K/uL 8.1  9.6  22.4  Hemoglobin 12.0 - 15.0 g/dL 13.2  13.4  13.4   Hematocrit 36.0 - 46.0 % 41.2  41.3  43.8   Platelets 150.0 - 400.0 K/uL 279.0  335  309     No results found for: "VD25OH", "VITAMINB12"  Clinical ASCVD: No  The ASCVD Risk score (Arnett DK, et al., 2019) failed to calculate for the following reasons:   The 2019 ASCVD risk score is only valid for ages 65 to 33       05/27/2022    3:54 PM 02/08/2022   11:33 AM 02/18/2021   11:38 AM  Depression screen PHQ 2/9  Decreased Interest 0 0 0  Down, Depressed, Hopeless 0 0 0  PHQ - 2 Score 0 0 0  Altered sleeping  0   Tired, decreased energy  0   Change in appetite  2   Feeling bad or failure about yourself   0   Trouble concentrating  0   Moving slowly or fidgety/restless  0   Suicidal thoughts  0   PHQ-9 Score  2       Social History   Tobacco Use  Smoking Status Never  Smokeless Tobacco Never   BP Readings from Last 3 Encounters:  05/18/22  134/80  02/08/22 (!) 144/98  11/25/20 124/80   Pulse Readings from Last 3 Encounters:  05/18/22 (!) 102  02/08/22 95  08/26/20 (!) 101   Wt Readings from Last 3 Encounters:  05/27/22 225 lb (102.1 kg)  05/18/22 225 lb 3.2 oz (102.2 kg)  02/08/22 229 lb 14.4 oz (104.3 kg)   BMI Readings from Last 3 Encounters:  05/27/22 40.50 kg/m  05/18/22 40.53 kg/m  02/08/22 41.38 kg/m    Allergies  Allergen Reactions   Penicillins Anaphylaxis and Hives    Has patient had a PCN reaction causing immediate rash, facial/tongue/throat swelling, SOB or lightheadedness with hypotension: Yes Has patient had a PCN reaction causing severe rash involving mucus membranes or skin necrosis: Yes Has patient had a PCN reaction that required hospitalization: No Has patient had a PCN reaction occurring within the last 10 years: Yes If all of the above answers are "NO", then may proceed with Cephalosporin use.    Keflex [Cephalexin]     Rash     Medications Reviewed Today     Reviewed by Viona Gilmore, [facility] (Pharmacist) on 07/25/22 at [phone]  Med List Status: <None>   Medication Order Taking? Sig Documenting Provider Last Dose Status Informant  amLODipine (NORVASC) 5 MG tablet [id number] No Take 1 tablet (5 mg total) by mouth daily. [name], MD Taking Active   apixaban (ELIQUIS) 2.5 MG TABS tablet [id number] No Take 1 tablet (2.5 mg total) by mouth 2 (two) times daily. [name], MD Taking Active   Blood Glucose Monitoring Suppl (ACCU-CHEK GUIDE) w/Device KIT [id number] No 1 Device by Other route daily. Use to test blood sugar once daily. Dx Code X41.28 [name], MD Taking Active   calcium-vitamin D (OSCAL WITH D) 500-200 MG-UNIT per tablet [id number] No Take 1 tablet by mouth daily. Marchia Bond, MD Taking Active Self  glipiZIDE (GLUCOTROL) 5 MG tablet [id number] No Take 0.5 tablets (2.5 mg total) by mouth daily before breakfast. [name], MD Taking Active   glucose  blood (ACCU-CHEK GUIDE) test strip [id number] No Use as instructed to test blood sugar once daily. Dx Code Y50.35 Burchette, Alinda Sierras, MD Taking Active   insulin glargine, 2 Unit Dial, (TOUJEO MAX  SOLOSTAR) 300 UNIT/ML Solostar Pen [id number] No Inject 24 Units into the skin daily. Burchette, Alinda Sierras, MD Taking Active   Insulin Pen Needle (BD PEN NEEDLE NANO U/F) 32G X 4 MM MISC [id number] No Use to inject insulin once daily. Dx Code E11.65 [name], MD Taking Active   levothyroxine (SYNTHROID) 137 MCG tablet [id number] No Take 1 tablet (137 mcg total) by mouth daily before breakfast. [name], MD Taking Active   lisinopril-hydrochlorothiazide (ZESTORETIC) 20-25 MG tablet [id number] No Take 1 tablet by mouth daily. [name], MD Taking Active   metFORMIN (GLUCOPHAGE-XR) 750 MG 24 hr tablet [id number]  TAKE 1 TABLET BY MOUTH TWICE DAILY AT  10AM  AND  5PM Burchette, Alinda Sierras, MD  Active   metoprolol tartrate (LOPRESSOR) 50 MG tablet [id number] No Take 1 tablet (50 mg total) by mouth 2 (two) times daily. [name], MD Taking Active   pravastatin (PRAVACHOL) 40 MG tablet [id number] No Take 1 tablet (40 mg total) by mouth daily. [name], MD Taking Active             Patient Active Problem List   Diagnosis Date Noted   Lumbar radiculopathy 02/12/2020   Acute deep vein thrombosis (DVT) of left lower extremity ([place]) 11/29/2019   S/P thoracentesis    Acute diastolic CHF (congestive heart failure) ([place])    AKI (acute kidney injury) ([place])    CAP (community acquired pneumonia) 07/10/2017   Acute respiratory failure ([place]) 07/10/2017   Diabetes mellitus with complication (HCC) 81/12/7508   Pleural effusion on left 07/10/2017   Lactic acidemia 07/10/2017   Sepsis, unspecified organism ([place]) 07/10/2017   Osteopenia 04/02/2016   Other specified hypothyroidism    Acute renal failure syndrome ([place])    Cellulitis 12/27/2014   Cellulitis of left lower extremity  12/26/2014   Obesity (BMI 30-39.9) 08/30/2013   Hyperlipidemia 09/25/2012   Breast cancer, left ([place]) 07/26/2012   Syncope 06/26/2012   Type 2 diabetes mellitus, uncontrolled 05/30/2012   Acute renal failure ([place]) 05/30/2012   HTN (hypertension) 05/30/2012   Postoperative anemia due to acute blood loss 05/29/2012   Tachycardia 05/27/2012   Closed fracture of right proximal humerus 05/26/2012   OVERWEIGHT 10/19/2009   Hypothyroidism 10/16/2009   Benign essential HTN 10/16/2009   SVT (supraventricular tachycardia) ([place]) 10/16/2009    Immunization History  Administered Date(s) Administered   Fluad Quad(high Dose 65+) 08/26/2020   Influenza Split 09/25/2012   Influenza,inj,Quad PF,6+ Mos 08/30/2013, 12/22/2014   PFIZER(Purple Top)SARS-COV-2 Vaccination 01/03/2020, 01/24/2020, 12/08/2020   Pneumococcal Conjugate-13 11/25/2020   Pneumococcal Polysaccharide-23 09/25/2012   Tdap 12/26/2009     Compliance/Adherence/Medication fill history: Care Gaps: Urine microalbumin, shingrix, tetanus, eye exam, foot exam, A1c, COVID, influenza Last BP - 134/80 on 04/30/2022 Last A1C - 7.8 on 04/30/2022  Star-Rating Drugs: Glipizide 5 mg - last filled 11/17/2022 90 DS at [facility] Lisinopril HCTZ 20/'25mg'$  - last filled 11/17/2022 90 DS at [facility] Metformin 750 mg - last filled 12/07/2022 90 DS at [facility] Pravastatin 40 mg - last filled 11/17/2022 90 DS at [place]:  (Social Determinants of Health) assessments and interventions performed: Yes (last 07/25/22) SDOH Interventions    [place] from 12/28/2022 in [place] [facility] from 07/25/2022 in [place] at [place] from 05/27/2022 in [place] [facility] [place] from 02/18/2021 in [place] [facility] [place] [facility] from 06/24/2020 in [place] at [place]  SDOH Interventions  Food Insecurity Interventions -- --  Intervention Not Indicated Intervention Not Indicated --  Housing Interventions -- -- Intervention Not Indicated Intervention Not Indicated --  Transportation Interventions -- -- Intervention Not Indicated Intervention Not Indicated Intervention Not Indicated  Financial Strain Interventions Intervention Not Indicated Intervention Not Indicated Intervention Not Indicated Intervention Not Indicated Intervention Not Indicated  Physical Activity Interventions -- -- Intervention Not Indicated Intervention Not Indicated --  Stress Interventions -- -- Intervention Not Indicated Intervention Not Indicated --  Social Connections Interventions -- -- Intervention Not Indicated Intervention Not Indicated --      SDOH Screenings   Food Insecurity: No Food Insecurity (05/27/2022)  Housing: Low Risk  (05/27/2022)  Transportation Needs: No Transportation Needs (05/27/2022)  Alcohol Screen: Low Risk  (05/27/2022)  Depression (PHQ2-9): Low Risk  (05/27/2022)  Financial Resource Strain: Low Risk  (12/28/2022)  Physical Activity: Inactive (05/27/2022)  Social Connections: Moderately Isolated (05/27/2022)  Stress: No Stress Concern Present (05/27/2022)  Tobacco Use: Low Risk  (05/27/2022)    Medication Assistance: None required.  Patient affirms current coverage meets needs.  Medication Access: Within the past 30 days, how often has patient missed a dose of medication? none Is a pillbox or other method used to improve adherence? Yes  Factors that may affect medication adherence? no barriers identified Are meds synced by current pharmacy? No  Are meds delivered by current pharmacy? No  Does patient experience delays in picking up medications due to transportation concerns? No   Upstream Services Reviewed: Is patient disadvantaged to use [facility]?: No  Current Rx insurance plan: [employer] Name and location of Current pharmacy:  [facility] [address], [place] - [place] [address] [place] [address] Phone: [phone] Fax: [phone]  [facility] Pharmacy services reviewed with patient today?: No  Patient requests to transfer care to [facility] Pharmacy?: No  Reason patient declined to change pharmacies: Not mentioned at this visit   Patient reports her son asked about starting her on Ozempic. Patient doesn't move at all because she is overweight and she is getting bent over as well which bothers her. She reports her blood sugars have been higher with the holidays. She reports her readings don't go up above 120 but it averages around 100s.  Patient lives with her son because she cannot afford being in a retirement home.   Patient inquired about coming off of Eliquis because she didn't know if she needed to continue with this. Discussed her clotting disorder and patient will discuss further with her PCP.  Patient inquired about getting set up with a CGM. She discussed it with her daughter in law who is a Marine scientist and already knows how to use them and could train her on it. She had discussed it previously with me but   Assessment/Plan  Patient Care Plan: [facility] Pharmacy Assessment/Plan     Problem Identified: Problem: Hypertension, Hyperlipidemia, Diabetes, Hypothyroidism, Osteopenia, and Osteoarthritis      Long-Range Goal: Patient-Specific Goal   Start Date: 07/25/2022  Expected End Date: 07/26/2023  Recent Progress: On track  Priority: High  Note:   Hypertension (BP goal <140/90) -Controlled -Current treatment: Amlodipine 5 mg 1 tablet daily - Appropriate, Effective, Safe, Accessible Lisinopril-HCTZ 20-25 mg 1 tablet daily - Appropriate, Effective, Safe, Accessible Metoprolol tartrate 50 mg 1 tablet twice daily - Appropriate, Effective, Safe, Accessible -Medications previously tried: unknown  -Current home readings: doesn't write it down; has brought to our office to make sure it's accurate - wrist cuff;  usually 130/80s -Current dietary habits: n/a -Current  exercise habits: walking some but limited with back pain -Denies hypotensive/hypertensive symptoms -Educated on BP goals and benefits of medications for prevention of heart attack, stroke and kidney damage; Importance of home blood pressure monitoring; Proper BP monitoring technique; -Counseled to monitor BP at home weekly, document, and provide log at future appointments -Counseled on diet and exercise extensively Recommended to continue current medication  Hyperlipidemia: (LDL goal < 100, TG goal < 150 ) -Uncontrolled TG; LDL controlled  -Current treatment: Pravastatin 40 mg 1 tablet daily - Appropriate, Query effective, Safe, Accessible -Medications previously tried: none  -Current dietary patterns: limiting bread -Current exercise habits: walking some but back pain is limited -Educated on Cholesterol goals;  Importance of limiting foods high in cholesterol; -Counseled on diet and exercise extensively Recommended to continue current medication  Diabetes (A1c goal <7%) -Uncontrolled -Current medications: Metformin XR 750 mg 1 tablet twice daily - Appropriate, Query effective, Query Safe, Accessible Glipizide 5 mg 1/2 tablet at breakfast - Appropriate, Query effective, Safe, Accessible Toujeo U-300 inject 24 units once daily - Appropriate, Query effective, Safe, Accessible -Medications previously tried: n/a  -Current home glucose readings fasting glucose: 100s, highest 120 lately post prandial glucose: does not check -Denies hypoglycemic/hyperglycemic symptoms -Current meal patterns: cutting back on bread breakfast: n/a  lunch: n/a  dinner: n/a snacks: n/a drinks: n/a -Current exercise: doesn't exercise enough but back really bothers her; walking in the house and around it and someone is in the house when she does; takes her about 20 minutes and about twice a day; belongs to the [facility] and is worried about falling -Educated on A1c and blood sugar goals; Complications of  diabetes including kidney damage, retinal damage, and cardiovascular disease; Benefits of routine self-monitoring of blood sugar; Continuous glucose monitoring; -Counseled to check feet daily and get yearly eye exams -Counseled on diet and exercise extensively Recommended to continue current medication Recommended repeat BMP and consider decreasing metformin dose. Submitted order for Dexcom.  Osteopenia (Goal prevent fractures) -Not ideally controlled -Last DEXA Scan: 2017             T-Score femoral neck: -1.4  T-Score total hip: n/a  T-Score lumbar spine: -0.6  T-Score forearm radius: n/a  10-year probability of major osteoporotic fracture: 11.5%  10-year probability of hip fracture: 2.4% -Patient is not a candidate for pharmacologic treatment -Current treatment  Calcium 500-200 mg-unit 1 tablet daily - Appropriate, Query effective, Safe, Accessible -Medications previously tried: n/a  -Recommend [phone] units of vitamin D daily. Recommend 1200 mg of calcium daily from dietary and supplemental sources. Recommend weight-bearing and muscle strengthening exercises for building and maintaining bone density. -Counseled on diet and exercise extensively  Hypothyroidism (Goal: TSH 2.5-4.5) -Uncontrolled -Current treatment  Levothyroxine 137 mcg 1 tablet daily - Appropriate, Query effective, Safe, Accessible -Medications previously tried: n/a  -Recommended repeat TSH or dose adjustment.  History of DVT (Goal: prevent blood clots) -Controlled -Current treatment  Eliquis 2.5 mg 1 tablet twice daily - Appropriate, Effective, Safe, Accessible -Medications previously tried: n/a  -Recommended to continue current medication   Health Maintenance -Vaccine gaps: shingrix (has not had chicken pox), COVID booster, tetanus, influenza -Current therapy:  No medications -Educated on Cost vs benefit of each product must be carefully weighed by individual consumer -Patient is satisfied with  current therapy and denies issues -Recommended to continue as is.      Jeni Salles, PharmD, [place] Pharmacist [place] [facility] [place]

## 2022-12-28 NOTE — Patient Instructions (Signed)
Hi Gorgeous,  It was great to catch up again! Don't forget to reach out to [facility] if you don't hear from them in the next 2 weeks.  Please reach out to me if you have any questions or need anything!  Best, Maddie  Jeni Salles, PharmD, [place] Pharmacist [place] [facility] [place]

## 2023-01-02 ENCOUNTER — Encounter: Payer: Self-pay | Admitting: Family Medicine

## 2023-01-02 ENCOUNTER — Ambulatory Visit (INDEPENDENT_AMBULATORY_CARE_PROVIDER_SITE_OTHER): Payer: Medicare HMO | Admitting: Family Medicine

## 2023-01-02 VITALS — BP 150/80 | HR 105 | Temp 97.7°F | Ht 62.5 in | Wt 225.4 lb

## 2023-01-02 DIAGNOSIS — E1122 Type 2 diabetes mellitus with diabetic chronic kidney disease: Secondary | ICD-10-CM | POA: Diagnosis not present

## 2023-01-02 DIAGNOSIS — N1832 Chronic kidney disease, stage 3b: Secondary | ICD-10-CM | POA: Diagnosis not present

## 2023-01-02 DIAGNOSIS — E118 Type 2 diabetes mellitus with unspecified complications: Secondary | ICD-10-CM

## 2023-01-02 DIAGNOSIS — Z86718 Personal history of other venous thrombosis and embolism: Secondary | ICD-10-CM | POA: Diagnosis not present

## 2023-01-02 DIAGNOSIS — N183 Chronic kidney disease, stage 3 unspecified: Secondary | ICD-10-CM | POA: Insufficient documentation

## 2023-01-02 DIAGNOSIS — Z6841 Body Mass Index (BMI) 40.0 and over, adult: Secondary | ICD-10-CM | POA: Diagnosis not present

## 2023-01-02 DIAGNOSIS — I509 Heart failure, unspecified: Secondary | ICD-10-CM | POA: Diagnosis not present

## 2023-01-02 LAB — POCT GLYCOSYLATED HEMOGLOBIN (HGB A1C): Hemoglobin A1C: 7.4 % — AB (ref 4.0–5.6)

## 2023-01-02 MED ORDER — TOUJEO MAX SOLOSTAR 300 UNIT/ML ~~LOC~~ SOPN: PEN_INJECTOR | SUBCUTANEOUS | 1 refills | Status: DC

## 2023-01-02 MED ORDER — OZEMPIC (0.25 OR 0.5 MG/DOSE) 2 MG/3ML ~~LOC~~ SOPN: PEN_INJECTOR | SUBCUTANEOUS | 1 refills | Status: DC

## 2023-01-02 MED ORDER — DEXCOM G7 RECEIVER DEVI: 1 refills | Status: DC

## 2023-01-02 MED ORDER — DEXCOM G7 SENSOR MISC: 1 refills | Status: DC

## 2023-01-02 NOTE — Patient Instructions (Addendum)
When you start the Ozempic, HOLD the Glipizide  We are also ordering the continuous glucose monitoring  Let me know if interested in switching to Xarelto once you finish current prescription for Eliquis.    A1C today 7.4%.

## 2023-01-02 NOTE — Progress Notes (Signed)
Established Patient Office Visit  Subjective   Patient ID: Michaela Rhodes, female    DOB: 12-01-1936  Age: 87 y.o. MRN: [id number]  Chief Complaint  Patient presents with   Follow-up    HPI   Ms. Michaela Rhodes is here today to discuss several things as follows.  Her past medical history is significant for obesity, hypertension, diastolic heart failure, history of DVT, hypothyroidism, type 2 diabetes, chronic kidney disease stage III, hyperlipidemia  She sent in a note electronically earlier today with the following concerns/issues that she wishes to address today:  -Questions about changing from Eliquis to [place] secondary to cost -Consideration for starting Ozempic after discussing with our clinical pharmacist.  She especially is interested in trying to lose some weight and possibly get off some of her oral medications. -Discussion with her daughters about possible continuous glucose monitoring system -Consider further interventions for her chronic back pain so that she can be more active.  She has been very sedentary for years.  Her diabetes has been fairly well-controlled but not great control in spite of Toujeo, metformin, and glipizide.  Denies any recent hypoglycemic episodes.  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  Also positive family history of DVT.  Has been on lower dose Eliquis with good success of prevention since that time.  We had discussed previously at some length pros and cons of coming off low-dose Eliquis versus continued in the setting of unprovoked DVT and positive family history in addition to the fact that she is very sedentary  Past Medical History:  Diagnosis Date   Cancer ([place])    breast cancer   DM2 (diabetes mellitus, type 2) ([place])    Hyperlipidemia    Hypertension    Hypothyroidism    Panic attacks    mild   SVT (supraventricular tachycardia)    in the past   Wears glasses    Past Surgical History:  Procedure Laterality Date    ABDOMINAL HYSTERECTOMY     BSO as well   APPENDECTOMY     APPLICATION OF A-CELL OF EXTREMITY Left 01/15/2015   Procedure: APPLICATION OF A-CELL OF EXTREMITY;  Surgeon: Theodoro Kos, DO;  Location: [facility];  Service: Plastics;  Laterality: Left;   CHOLECYSTECTOMY     I & D EXTREMITY Left 01/15/2015   Procedure: IRRIGATION AND DEBRIDEMENT EXTREMITY;  Surgeon: Theodoro Kos, DO;  Location: [facility];  Service: Plastics;  Laterality: Left;   IR KYPHO EA ADDL LEVEL THORACIC OR LUMBAR  05/10/2017   IR KYPHO THORACIC WITH BONE BIOPSY  05/10/2017   IR RADIOLOGIST EVAL & MGMT  04/28/2017   MASTECTOMY  1985   Left   RECONSTRUCTION BREAST W/ LATISSIMUS DORSI FLAP     TOTAL SHOULDER ARTHROPLASTY  05/27/2012   Procedure: TOTAL SHOULDER ARTHROPLASTY;  Surgeon: Johnny Bridge, MD;  Location: [facility];  Service: Orthopedics;  Laterality: Right;    reports that she has never smoked. She has never used smokeless tobacco. She reports that she does not drink alcohol and does not use drugs. family history includes Coronary artery disease in an other family member; Diabetes in an other family member; Heart disease in her mother; Hypertension in her father, mother, sister, and another family member; Stroke in her father. Allergies  Allergen Reactions   Penicillins Anaphylaxis and Hives    Has patient had a PCN reaction causing immediate rash, facial/tongue/throat swelling, SOB or lightheadedness with hypotension: Yes Has patient had a  PCN reaction causing severe rash involving mucus membranes or skin necrosis: Yes Has patient had a PCN reaction that required hospitalization: No Has patient had a PCN reaction occurring within the last 10 years: Yes If all of the above answers are "NO", then may proceed with Cephalosporin use.    Keflex [Cephalexin]     Rash       Review of Systems  Constitutional:  Positive for malaise/fatigue. Negative for chills and fever.  Respiratory:   Negative for cough and shortness of breath.   Cardiovascular:  Negative for chest pain.  Gastrointestinal:  Negative for abdominal pain.  Genitourinary:  Negative for dysuria.      Objective:     BP (!) 150/80 (BP Location: Left Arm, Patient Position: Sitting, Cuff Size: Large)   Pulse (!) 105   Temp 97.7 F (36.5 C) (Oral)   Ht 5' 2.5" (1.588 m)   Wt 225 lb 6.4 oz (102.2 kg)   SpO2 97%   BMI 40.57 kg/m  BP Readings from Last 3 Encounters:  01/02/23 (!) 150/80  05/18/22 134/80  02/08/22 (!) 144/98   Wt Readings from Last 3 Encounters:  01/02/23 225 lb 6.4 oz (102.2 kg)  05/27/22 225 lb (102.1 kg)  05/18/22 225 lb 3.2 oz (102.2 kg)      Physical Exam Vitals reviewed.  Constitutional:      Appearance: She is obese.  Cardiovascular:     Rate and Rhythm: Normal rate and regular rhythm.  Pulmonary:     Effort: Pulmonary effort is normal.     Breath sounds: Normal breath sounds.  Musculoskeletal:     Comments: No significant pitting edema.  Neurological:     Mental Status: She is alert.      Results for orders placed or performed in visit on 01/02/23  POCT glycosylated hemoglobin (Hb A1C)  Result Value Ref Range   Hemoglobin A1C 7.4 (A) 4.0 - 5.6 %   HbA1c POC (<> result, manual entry)     HbA1c, POC (prediabetic range)     HbA1c, POC (controlled diabetic range)        The ASCVD Risk score (Arnett DK, et al., 2019) failed to calculate for the following reasons:   The 2019 ASCVD risk score is only valid for ages 57 to 71    Assessment & Plan:   #1 type 2 diabetes.  History of poor control.  Longstanding history of obesity.  She is on combination therapy with Toujeo, metformin, and glipizide.  We discussed pros and cons of GLP-1's at some length.  She would like to start Ozempic if she can get this covered. -Would start Ozempic 0.25 mg subcutaneous once weekly.  She is aware of potential side effects including nausea.  Would give feedback in 1 month if  tolerating well at that point titrate to 0.5 mg -If she is able to get started on Ozempic would go ahead and have her hold the glipizide for now.  Our goal would be to eventually reduce her metformin further although she is tolerating currently without any loose stools or other side effects -Prescription sent for continuous glucose monitoring system and if she is able to get this would have her follow-up with our clinical pharmacist to go over  #2 history of unprovoked DVT.  Also has positive family history of DVT.  She is very sedentary and at increased risk of future events.  We did discuss possibility of going off anticoagulant altogether although I feel like her future  risk is fairly high.  Cost is her main issue.  She just got her Eliquis filled.  Consider change to low-dose Xarelto in 1 month when she is finishing end of her Eliquis prescription  #3 history of chronic kidney disease.  Recheck basic metabolic panel.  Avoid nonsteroidals.   Carolann Littler, MD

## 2023-01-03 LAB — BASIC METABOLIC PANEL
BUN: 31 mg/dL — ABNORMAL HIGH (ref 6–23)
CO2: 25 mEq/L (ref 19–32)
Calcium: 9.5 mg/dL (ref 8.4–10.5)
Chloride: 102 mEq/L (ref 96–112)
Creatinine, Ser: 1.29 mg/dL — ABNORMAL HIGH (ref 0.40–1.20)
GFR: 38.09 mL/min — ABNORMAL LOW (ref >60.00)
Glucose, Bld: 171 mg/dL — ABNORMAL HIGH (ref 70–99)
Potassium: 4.2 mEq/L (ref 3.5–5.1)
Sodium: 140 mEq/L (ref 135–145)

## 2023-01-17 ENCOUNTER — Other Ambulatory Visit: Payer: Self-pay | Admitting: Family Medicine

## 2023-01-19 ENCOUNTER — Telehealth: Payer: Self-pay

## 2023-01-19 NOTE — Progress Notes (Unsigned)
[facility] Pharmacy Team  Reason for Encounter: Follow up phone call  Was patient able to get a dexcom meter? Is patient liking it? Any concerns?  Has patient started Ozempic? Is she tolerating it?  Unable to reach patient after several attempts.    [place] Pharmacist Assistant [phone]

## 2023-01-27 ENCOUNTER — Other Ambulatory Visit: Payer: Self-pay | Admitting: Family Medicine

## 2023-02-01 ENCOUNTER — Encounter: Payer: Self-pay | Admitting: Family Medicine

## 2023-02-01 ENCOUNTER — Ambulatory Visit (INDEPENDENT_AMBULATORY_CARE_PROVIDER_SITE_OTHER): Payer: Medicare HMO | Admitting: Family Medicine

## 2023-02-01 VITALS — BP 136/84 | HR 103 | Temp 98.1°F | Ht 62.5 in | Wt 223.9 lb

## 2023-02-01 DIAGNOSIS — N1832 Chronic kidney disease, stage 3b: Secondary | ICD-10-CM | POA: Diagnosis not present

## 2023-02-01 DIAGNOSIS — E118 Type 2 diabetes mellitus with unspecified complications: Secondary | ICD-10-CM | POA: Diagnosis not present

## 2023-02-01 DIAGNOSIS — M545 Low back pain, unspecified: Secondary | ICD-10-CM | POA: Diagnosis not present

## 2023-02-01 MED ORDER — DULOXETINE HCL 30 MG PO CPEP: ORAL_CAPSULE | ORAL | 3 refills | Status: DC

## 2023-02-01 NOTE — Patient Instructions (Signed)
Increase the Ozempic to 0.5 mg [place] once weekly  Let's plan on follow up in about 2 months.

## 2023-02-01 NOTE — Progress Notes (Signed)
Established Patient Office Visit  Subjective   Patient ID: Michaela Rhodes, female    DOB: [birth year]  Age: 87 y.o. MRN: [id number]  Chief Complaint  Patient presents with   Medication Consultation    HPI   [name] is seen for follow-up.  Refer to last note from 15 January for details  Type 2 diabetes with history of suboptimal control.  She had expressed interest in GLP-1 medication.  We ended up prescribing Ozempic 0.25 mg subcutaneous once weekly.  She is tolerating well with no side effects whatsoever.  She would like to consider further titration now up to 0.5 mg.  We did recently discontinue her glipizide.  She has lost a couple of pounds but realizes we do not expect much weight loss with initial titration.  She does remain on Toujeo 24 units once daily.  No recent hypoglycemic symptoms.  She did go ahead with Dexcom continuous glucose monitor and is found that very helpful with helping her to see effect of different foods on her blood sugar.  She has chronic back pain.  She realizes that some of this is probably exacerbated by her inactivity and weight.  She specifically had questions regarding Cymbalta.  She has not tried this previously.  She does feel like her chronic back pain to some extent reduces her motivation to be more active.  Past history of unprovoked DVT and remains on Eliquis low-dose.  She has chronic kidney disease with recent GFR of 38.  Past Medical History:  Diagnosis Date   Cancer ([place])    breast cancer   DM2 (diabetes mellitus, type 2) ([place])    Hyperlipidemia    Hypertension    Hypothyroidism    Panic attacks    mild   SVT (supraventricular tachycardia)    in the past   Wears glasses    Past Surgical History:  Procedure Laterality Date   ABDOMINAL HYSTERECTOMY     BSO as well   APPENDECTOMY     APPLICATION OF A-CELL OF EXTREMITY Left 01/15/2015   Procedure: APPLICATION OF A-CELL OF EXTREMITY;  Surgeon: Theodoro Kos, DO;  Location: [facility];  Service: Plastics;  Laterality: Left;   CHOLECYSTECTOMY     I & D EXTREMITY Left 01/15/2015   Procedure: IRRIGATION AND DEBRIDEMENT EXTREMITY;  Surgeon: Theodoro Kos, DO;  Location: [facility];  Service: Plastics;  Laterality: Left;   IR KYPHO EA ADDL LEVEL THORACIC OR LUMBAR  05/10/2017   IR KYPHO THORACIC WITH BONE BIOPSY  05/10/2017   IR RADIOLOGIST EVAL & MGMT  04/28/2017   MASTECTOMY  1985   Left   RECONSTRUCTION BREAST W/ LATISSIMUS DORSI FLAP     TOTAL SHOULDER ARTHROPLASTY  05/27/2012   Procedure: TOTAL SHOULDER ARTHROPLASTY;  Surgeon: Johnny Bridge, MD;  Location: [facility];  Service: Orthopedics;  Laterality: Right;    reports that she has never smoked. She has never used smokeless tobacco. She reports that she does not drink alcohol and does not use drugs. family history includes Coronary artery disease in an other family member; Diabetes in an other family member; Heart disease in her mother; Hypertension in her father, mother, sister, and another family member; Stroke in her father. Allergies  Allergen Reactions   Penicillins Anaphylaxis and Hives    Has patient had a PCN reaction causing immediate rash, facial/tongue/throat swelling, SOB or lightheadedness with hypotension: Yes Has patient had a PCN reaction causing severe rash involving mucus membranes or  skin necrosis: Yes Has patient had a PCN reaction that required hospitalization: No Has patient had a PCN reaction occurring within the last 10 years: Yes If all of the above answers are "NO", then may proceed with Cephalosporin use.    Keflex [Cephalexin]     Rash     Review of Systems  Constitutional:  Negative for chills, fever and malaise/fatigue.  Eyes:  Negative for blurred vision.  Respiratory:  Negative for shortness of breath.   Cardiovascular:  Negative for chest pain.  Gastrointestinal:  Negative for abdominal pain.  Neurological:  Negative for dizziness, weakness and  headaches.      Objective:     BP 136/84 (BP Location: Left Arm, Patient Position: Sitting, Cuff Size: Large)   Pulse (!) 103   Temp 98.1 F (36.7 C) (Oral)   Ht 5' 2.5" (1.588 m)   Wt 223 lb 14.4 oz (101.6 kg)   SpO2 96%   BMI 40.30 kg/m  BP Readings from Last 3 Encounters:  02/01/23 136/84  01/02/23 (!) 150/80  05/18/22 134/80   Wt Readings from Last 3 Encounters:  02/01/23 223 lb 14.4 oz (101.6 kg)  01/02/23 225 lb 6.4 oz (102.2 kg)  05/27/22 225 lb (102.1 kg)      Physical Exam Vitals reviewed.  Constitutional:      Appearance: She is well-developed.  Eyes:     Pupils: Pupils are equal, round, and reactive to light.  Neck:     Thyroid: No thyromegaly.     Vascular: No JVD.  Cardiovascular:     Rate and Rhythm: Normal rate and regular rhythm.     Heart sounds:     No gallop.  Pulmonary:     Effort: Pulmonary effort is normal. No respiratory distress.     Breath sounds: Normal breath sounds. No wheezing or rales.  Musculoskeletal:     Cervical back: Neck supple.     Right lower leg: No edema.     Left lower leg: No edema.  Neurological:     Mental Status: She is alert.      No results found for any visits on 02/01/23.    The ASCVD Risk score (Arnett DK, et al., 2019) failed to calculate for the following reasons:   The 2019 ASCVD risk score is only valid for ages 55 to 35    Assessment & Plan:   #1 type 2 diabetes.  History of poor control.  Recent A1c 7.4%.  Recent initiation of Ozempic and tolerating well.  Increase Ozempic to 0.5 mg subcutaneous once weekly.  Set up follow-up in about 2 months to reassess.  We did caution her that she may have to reduce her Toujeo as her blood sugar improves.  She is now off glipizide.  #2 chronic low back pain.  Discussed management options.  She specifically would like to try Cymbalta.  Discussed possible side effects.  Initiate Cymbalta 30 mg once daily for 1 week and then if tolerating well increase to 60 mg  daily.  Reassess at follow-up  #3 chronic kidney disease.  Recent GFR 38.  Will have to watch her GFR closely with current metformin therapy.  Carolann Littler, MD

## 2023-02-23 ENCOUNTER — Other Ambulatory Visit: Payer: Self-pay | Admitting: Family Medicine

## 2023-03-03 ENCOUNTER — Ambulatory Visit (INDEPENDENT_AMBULATORY_CARE_PROVIDER_SITE_OTHER): Payer: Medicare HMO | Admitting: Family Medicine

## 2023-03-03 ENCOUNTER — Encounter: Payer: Self-pay | Admitting: Family Medicine

## 2023-03-03 VITALS — BP 138/82 | HR 123 | Temp 97.3°F | Ht 62.5 in | Wt 216.7 lb

## 2023-03-03 DIAGNOSIS — I10 Essential (primary) hypertension: Secondary | ICD-10-CM | POA: Diagnosis not present

## 2023-03-03 DIAGNOSIS — S5011XA Contusion of right forearm, initial encounter: Secondary | ICD-10-CM | POA: Diagnosis not present

## 2023-03-03 DIAGNOSIS — E118 Type 2 diabetes mellitus with unspecified complications: Secondary | ICD-10-CM | POA: Diagnosis not present

## 2023-03-03 NOTE — Patient Instructions (Signed)
Reduce the Tresiba to 22 units once daily and reduce 2 units every 4-5 days if fastings consistently < 110.

## 2023-03-03 NOTE — Progress Notes (Signed)
Established Patient Office Visit  Subjective   Patient ID: Michaela Rhodes, female    DOB: July 05, 1936  Age: 87 y.o. MRN: [id number]  Chief Complaint  Patient presents with   Fall    Patient reports fall, x5 days    Arm Injury    Patient reports right arm injury and bruising after fall, x5 days     HPI   Here for acute issue of fall last Sunday.  She was getting up from dining room table and apparently hung her foot on carpet and fell landing against a bench.  She hit her head against a pillow but no loss of consciousness.  She had immediate swelling right dorsal forearm and this eventually extended to the arm region.  No significant pain.  Extensive bruising since then.  Full range of motion elbow, shoulder, and wrist.  She is on Eliquis.  No headache.  No cognitive change.  She states that her head strike was very mild against the pillow and not of a concern.  They were more concerned about the right upper extremity swelling.  She has type 2 diabetes with recent A1c 7.4%.  She went on Ozempic and has already lost 7 pounds over the past month.  She had sugar of 88 recently which was low for her.  There was 1 episode where she was concerned she may have had some hypoglycemic symptoms which did not improve with some orange juice.  No consistent hypoglycemic symptoms.  She also remains on metformin and Tresiba along with the Ozempic.  Her current [place] dose is 24 units daily.  She has hypertension treated with amlodipine 5 mg daily and Zestoretic 20/25 mg 1 daily along with metoprolol tartrate 50 mg twice daily.  No recent orthostatic changes.  Past Medical History:  Diagnosis Date   Cancer ([place])    breast cancer   DM2 (diabetes mellitus, type 2) ([place])    Hyperlipidemia    Hypertension    Hypothyroidism    Panic attacks    mild   SVT (supraventricular tachycardia)    in the past   Wears glasses    Past Surgical History:  Procedure Laterality Date   ABDOMINAL HYSTERECTOMY      BSO as well   APPENDECTOMY     APPLICATION OF A-CELL OF EXTREMITY Left 01/15/2015   Procedure: APPLICATION OF A-CELL OF EXTREMITY;  Surgeon: Theodoro Kos, DO;  Location: [facility];  Service: Plastics;  Laterality: Left;   CHOLECYSTECTOMY     I & D EXTREMITY Left 01/15/2015   Procedure: IRRIGATION AND DEBRIDEMENT EXTREMITY;  Surgeon: Theodoro Kos, DO;  Location: [facility];  Service: Plastics;  Laterality: Left;   IR KYPHO EA ADDL LEVEL THORACIC OR LUMBAR  05/10/2017   IR KYPHO THORACIC WITH BONE BIOPSY  05/10/2017   IR RADIOLOGIST EVAL & MGMT  04/28/2017   MASTECTOMY  1985   Left   RECONSTRUCTION BREAST W/ LATISSIMUS DORSI FLAP     TOTAL SHOULDER ARTHROPLASTY  05/27/2012   Procedure: TOTAL SHOULDER ARTHROPLASTY;  Surgeon: Johnny Bridge, MD;  Location: [facility];  Service: Orthopedics;  Laterality: Right;    reports that she has never smoked. She has never used smokeless tobacco. She reports that she does not drink alcohol and does not use drugs. family history includes Coronary artery disease in an other family member; Diabetes in an other family member; Heart disease in her mother; Hypertension in her father, mother, sister, and another family member;  Stroke in her father. Allergies  Allergen Reactions   Penicillins Anaphylaxis and Hives    Has patient had a PCN reaction causing immediate rash, facial/tongue/throat swelling, SOB or lightheadedness with hypotension: Yes Has patient had a PCN reaction causing severe rash involving mucus membranes or skin necrosis: Yes Has patient had a PCN reaction that required hospitalization: No Has patient had a PCN reaction occurring within the last 10 years: Yes If all of the above answers are "NO", then may proceed with Cephalosporin use.    Keflex [Cephalexin]     Rash     Review of Systems  Constitutional:  Negative for malaise/fatigue.  Eyes:  Negative for blurred vision.  Respiratory:  Negative for shortness of  breath.   Cardiovascular:  Negative for chest pain.  Neurological:  Negative for dizziness, weakness and headaches.      Objective:     BP 138/82 (BP Location: Left Arm, Cuff Size: Large)   Pulse (!) 123   Temp (!) 97.3 F (36.3 C) (Oral)   Ht 5' 2.5" (1.588 m)   Wt 216 lb 11.2 oz (98.3 kg)   SpO2 98%   BMI 39.00 kg/m  BP Readings from Last 3 Encounters:  03/03/23 138/82  02/01/23 136/84  01/02/23 (!) 150/80   Wt Readings from Last 3 Encounters:  03/03/23 216 lb 11.2 oz (98.3 kg)  02/01/23 223 lb 14.4 oz (101.6 kg)  01/02/23 225 lb 6.4 oz (102.2 kg)      Physical Exam Constitutional:      Appearance: She is well-developed.  Eyes:     Pupils: Pupils are equal, round, and reactive to light.  Neck:     Thyroid: No thyromegaly.     Vascular: No JVD.  Cardiovascular:     Rate and Rhythm: Normal rate and regular rhythm.     Heart sounds:     No gallop.  Pulmonary:     Effort: Pulmonary effort is normal. No respiratory distress.     Breath sounds: Normal breath sounds. No wheezing or rales.  Musculoskeletal:     Comments: Right forearm reveals extensive bruising involving basically all the forearm and also extending up involving most of the right arm.  She has hematoma which is about 4 x 4 cm dorsum mid right forearm.  No bony tenderness.  Full range of motion elbow and wrist.  No wrist tenderness.  No tenderness in palpating around the olecranon process or radial head region.  No pain with supination and pronation of the forearm  Neurological:     Mental Status: She is alert.      No results found for any visits on 03/03/23.  Last CBC Lab Results  Component Value Date   WBC 8.1 02/08/2022   HGB 13.2 02/08/2022   HCT 41.2 02/08/2022   MCV 84.6 02/08/2022   MCH 27.5 11/25/2020   RDW 13.7 02/08/2022   PLT 279.0 123456   Last metabolic panel Lab Results  Component Value Date   GLUCOSE 171 (H) 01/02/2023   NA 140 01/02/2023   K 4.2 01/02/2023   CL 102  01/02/2023   CO2 25 01/02/2023   BUN 31 (H) 01/02/2023   CREATININE 1.29 (H) 01/02/2023   GFRNONAA 38 (L) 11/29/2018   CALCIUM 9.5 01/02/2023   PROT 7.1 05/18/2022   ALBUMIN 4.2 05/18/2022   BILITOT 0.4 05/18/2022   ALKPHOS 116 05/18/2022   AST 15 05/18/2022   ALT 14 05/18/2022   ANIONGAP 14 11/29/2018   Last hemoglobin  A1c Lab Results  Component Value Date   HGBA1C 7.4 (A) 01/02/2023      The ASCVD Risk score (Arnett DK, et al., 2019) failed to calculate for the following reasons:   The 2019 ASCVD risk score is only valid for ages 12 to 75    Assessment & Plan:   #1 fall with hematoma right forearm.  Suspect soft tissue injury.  She has extensive bruising and some fairly extensive swelling.  She is on Eliquis which no doubt exacerbated her bleeding.  Swelling seems to have stabilized.  Low clinical suspicion for fracture  -Obtain x-ray right forearm -She is aware that bruising and swelling will likely take several weeks to resolve  #2 type 2 diabetes.  Recent hypoglycemic symptoms on Tresiba in the setting of recent initiation of Ozempic with 7 pound weight loss  -Reduce Tresiba to 22 units once daily and will have her drop back 2 units every 4 to 5 days if fastings consistently less than 110.  Continue current dose of metformin and Ozempic  #3 hypertension.  Blood pressure was up some initially 156/80 and did improve some after rest 138/82.  Continue monitoring  No follow-ups on file.    Carolann Littler, MD

## 2023-03-06 ENCOUNTER — Encounter: Payer: Self-pay | Admitting: Family Medicine

## 2023-03-06 ENCOUNTER — Ambulatory Visit (INDEPENDENT_AMBULATORY_CARE_PROVIDER_SITE_OTHER): Payer: Medicare HMO

## 2023-03-06 ENCOUNTER — Telehealth (INDEPENDENT_AMBULATORY_CARE_PROVIDER_SITE_OTHER): Payer: Medicare HMO | Admitting: Family Medicine

## 2023-03-06 ENCOUNTER — Other Ambulatory Visit: Payer: Medicare HMO

## 2023-03-06 VITALS — Ht 62.5 in | Wt 216.7 lb

## 2023-03-06 DIAGNOSIS — S5011XA Contusion of right forearm, initial encounter: Secondary | ICD-10-CM

## 2023-03-06 DIAGNOSIS — M7989 Other specified soft tissue disorders: Secondary | ICD-10-CM | POA: Diagnosis not present

## 2023-03-06 NOTE — Progress Notes (Signed)
Patient ID: Michaela Rhodes, female   DOB: [birth year], 87 y.o.   MRN: [id number]   Virtual Visit via Video Note  I connected with Michaela Rhodes on 03/06/23 at  3:00 PM EDT by a video enabled telemedicine application and verified that I am speaking with the correct person using two identifiers.  Location patient: home Location provider:work or home office Persons participating in the virtual visit: patient, provider  I discussed the limitations of evaluation and management by telemedicine and the availability of in person appointments. The patient expressed understanding and agreed to proceed.   HPI:  Michaela Rhodes had recent fall.  Refer to prior note for details.  She had large hematoma right upper extremity but no significant right arm or forearm pain.  She takes Eliquis twice daily.  We had sent her for x-ray and there was a problem with the equipment and she returned earlier today for x-ray.  This is yet to be over read but by my reading did not see any obvious fractures.  She has had no additional swelling.  She has extensive bruising as previously noted.  No skin breakdown from hematoma.  No difficulties with range of motion wrist and elbow.  No headaches or dizziness.  No confusion.   ROS: See pertinent positives and negatives per HPI.  Past Medical History:  Diagnosis Date   Cancer ([place])    breast cancer   DM2 (diabetes mellitus, type 2) ([address])    Hyperlipidemia    Hypertension    Hypothyroidism    Panic attacks    mild   SVT (supraventricular tachycardia)    in the past   Wears glasses     Past Surgical History:  Procedure Laterality Date   ABDOMINAL HYSTERECTOMY     BSO as well   APPENDECTOMY     APPLICATION OF A-CELL OF EXTREMITY Left 01/15/2015   Procedure: APPLICATION OF A-CELL OF EXTREMITY;  Surgeon: Theodoro Kos, DO;  Location: [facility];  Service: Plastics;  Laterality: Left;   CHOLECYSTECTOMY     I & D EXTREMITY Left 01/15/2015   Procedure:  IRRIGATION AND DEBRIDEMENT EXTREMITY;  Surgeon: Theodoro Kos, DO;  Location: [facility];  Service: Plastics;  Laterality: Left;   IR KYPHO EA ADDL LEVEL THORACIC OR LUMBAR  05/10/2017   IR KYPHO THORACIC WITH BONE BIOPSY  05/10/2017   IR RADIOLOGIST EVAL & MGMT  04/28/2017   MASTECTOMY  1985   Left   RECONSTRUCTION BREAST W/ LATISSIMUS DORSI FLAP     TOTAL SHOULDER ARTHROPLASTY  05/27/2012   Procedure: TOTAL SHOULDER ARTHROPLASTY;  Surgeon: Johnny Bridge, MD;  Location: [facility];  Service: Orthopedics;  Laterality: Right;    Family History  Problem Relation Age of Onset   Heart disease Mother    Hypertension Mother    Stroke Father    Hypertension Father    Coronary artery disease Other        family hx of   Diabetes Other        family hx of   Hypertension Other        family hx of   Hypertension Sister     SOCIAL HX: Non-smoker.   Current Outpatient Medications:    amLODipine (NORVASC) 5 MG tablet, Take 1 tablet (5 mg total) by mouth daily., Disp: 90 tablet, Rfl: 3   apixaban (ELIQUIS) 2.5 MG TABS tablet, Take 1 tablet (2.5 mg total) by mouth 2 (two) times daily., Disp: 180 tablet, Rfl:  3   Blood Glucose Monitoring Suppl (ACCU-CHEK GUIDE) w/Device KIT, 1 Device by Other route daily. Use to test blood sugar once daily. Dx Code E11.65, Disp: 1 kit, Rfl: 1   calcium-vitamin D (OSCAL WITH D) 500-200 MG-UNIT per tablet, Take 1 tablet by mouth daily., Disp: 100 tablet, Rfl: 2   Continuous Blood Gluc Receiver ([place]) DEVI, Use as directed to check blood sugar daily, Disp: 1 each, Rfl: 1   Continuous Blood Gluc Sensor (DEXCOM G7 SENSOR) MISC, USE AS NEEDED TO CHECK BLOOD SUGAR DAILY, Disp: 1 each, Rfl: 0   DULoxetine (CYMBALTA) 30 MG capsule, Take one capsule by mouth once daily for one week and then increase to two capsules daily., Disp: 60 capsule, Rfl: 3   glucose blood (ACCU-CHEK GUIDE) test strip, Use as instructed to test blood sugar once daily. Dx Code  E11.65, Disp: 100 each, Rfl: 3   insulin glargine, 2 Unit Dial, (TOUJEO MAX SOLOSTAR) 300 UNIT/ML Solostar Pen, INJECT 24 UNITS SUBCUTANEOUSLY ONCE DAILY, Disp: 9 mL, Rfl: 1   Insulin Pen Needle (BD PEN NEEDLE NANO U/F) 32G X 4 MM MISC, Use to inject insulin once daily. Dx Code E11.65, Disp: 100 each, Rfl: 1   levothyroxine (SYNTHROID) 137 MCG tablet, Take 1 tablet (137 mcg total) by mouth daily before breakfast., Disp: 90 tablet, Rfl: 3   lisinopril-hydrochlorothiazide (ZESTORETIC) 20-25 MG tablet, Take 1 tablet by mouth daily., Disp: 90 tablet, Rfl: 3   metFORMIN (GLUCOPHAGE-XR) 750 MG 24 hr tablet, TAKE 1 TABLET BY MOUTH TWICE DAILY AT 10AM AND 5PM . APPOINTMENT REQUIRED FOR FUTURE REFILLS, Disp: 180 tablet, Rfl: 0   metoprolol tartrate (LOPRESSOR) 50 MG tablet, Take 1 tablet (50 mg total) by mouth 2 (two) times daily., Disp: 180 tablet, Rfl: 3   pravastatin (PRAVACHOL) 40 MG tablet, Take 1 tablet (40 mg total) by mouth daily., Disp: 90 tablet, Rfl: 3   Semaglutide,0.25 or 0.5MG /DOS, (OZEMPIC, 0.25 OR 0.5 MG/DOSE,) 2 MG/3ML SOPN, INJECT 0.5MG  SUBCUTANEOUS ONCE WEEKLY, Disp: 6 mL, Rfl: 0  EXAM:  VITALS per patient if applicable:  GENERAL: alert, oriented, appears well and in no acute distress  HEENT: atraumatic, conjunttiva clear, no obvious abnormalities on inspection of external nose and ears  NECK: normal movements of the head and neck  LUNGS: on inspection no signs of respiratory distress, breathing rate appears normal, no obvious gross SOB, gasping or wheezing  CV: no obvious cyanosis  MS: moves all visible extremities without noticeable abnormality  PSYCH/NEURO: pleasant and cooperative, no obvious depression or anxiety, speech and thought processing grossly intact  ASSESSMENT AND PLAN:  Discussed the following assessment and plan:   Traumatic hematoma right forearm.  X-rays obtained and to be over read by radiology.  No obvious fracture.  She has no significant pain at this  time.  No skin breakdown. -Continue frequent elevation of right upper extremity -She is aware bruising and swelling will take months to resolve -Follow-up immediately for any skin breakdown or signs of secondary infection    I discussed the assessment and treatment plan with the patient. The patient was provided an opportunity to ask questions and all were answered. The patient agreed with the plan and demonstrated an understanding of the instructions.   The patient was advised to call back or seek an in-person evaluation if the symptoms worsen or if the condition fails to improve as anticipated.     Carolann Littler, MD

## 2023-03-12 ENCOUNTER — Other Ambulatory Visit: Payer: Self-pay | Admitting: Family Medicine

## 2023-04-12 ENCOUNTER — Other Ambulatory Visit: Payer: Self-pay | Admitting: Family Medicine

## 2023-05-16 ENCOUNTER — Other Ambulatory Visit: Payer: Self-pay | Admitting: Family Medicine

## 2023-05-31 ENCOUNTER — Ambulatory Visit (INDEPENDENT_AMBULATORY_CARE_PROVIDER_SITE_OTHER): Payer: Medicare HMO

## 2023-05-31 VITALS — Ht 62.5 in | Wt 216.0 lb

## 2023-05-31 DIAGNOSIS — Z0000 Encounter for general adult medical examination without abnormal findings: Secondary | ICD-10-CM

## 2023-05-31 NOTE — Patient Instructions (Addendum)
Ms. Michaela Rhodes , Thank you for taking time to come for your Medicare Wellness Visit. I appreciate your ongoing commitment to your health goals. Please review the following plan we discussed and let me know if I can assist you in the future.   These are the goals we discussed:  Goals       No current goals (pt-stated)      Not fall (pt-stated)      Weight (lb) < 200 lb (90.7 kg)        This is a list of the screening recommended for you and due dates:  Health Maintenance  Topic Date Due   DTaP/Tdap/Td vaccine (2 - Td or Tdap) 12/27/2019   Eye exam for diabetics  03/03/2021   Complete foot exam   08/26/2021   Yearly kidney health urinalysis for diabetes  06/01/2023*   COVID-19 Vaccine (4 - 2023-24 season) 06/16/2023*   Zoster (Shingles) Vaccine (1 of 2) 08/31/2023*   Hemoglobin A1C  07/03/2023   Flu Shot  07/20/2023   Yearly kidney function blood test for diabetes  01/03/2024   Medicare Annual Wellness Visit  05/30/2024   Pneumonia Vaccine  Completed   DEXA scan (bone density measurement)  Completed   HPV Vaccine  Aged Out  *Topic was postponed. The date shown is not the original due date.    Advanced directives: Please bring a copy of your health care power of attorney and living will to the office to be added to your chart at your convenience.   Conditions/risks identified: None  Next appointment: Follow up in one year for your annual wellness visit    Preventive Care 65 Years and Older, Female Preventive care refers to lifestyle choices and visits with your health care provider that can promote health and wellness. What does preventive care include? A yearly physical exam. This is also called an annual well check. Dental exams once or twice a year. Routine eye exams. Ask your health care provider how often you should have your eyes checked. Personal lifestyle choices, including: Daily care of your teeth and gums. Regular physical activity. Eating a healthy  diet. Avoiding tobacco and drug use. Limiting alcohol use. Practicing safe sex. Taking low-dose aspirin every day. Taking vitamin and mineral supplements as recommended by your health care provider. What happens during an annual well check? The services and screenings done by your health care provider during your annual well check will depend on your age, overall health, lifestyle risk factors, and family history of disease. Counseling  Your health care provider may ask you questions about your: Alcohol use. Tobacco use. Drug use. Emotional well-being. Home and relationship well-being. Sexual activity. Eating habits. History of falls. Memory and ability to understand (cognition). Work and work Astronomer. Reproductive health. Screening  You may have the following tests or measurements: Height, weight, and BMI. Blood pressure. Lipid and cholesterol levels. These may be checked every 5 years, or more frequently if you are over 68 years old. Skin check. Lung cancer screening. You may have this screening every year starting at age 50 if you have a 30-pack-year history of smoking and currently smoke or have quit within the past 15 years. Fecal occult blood test (FOBT) of the stool. You may have this test every year starting at age 44. Flexible sigmoidoscopy or colonoscopy. You may have a sigmoidoscopy every 5 years or a colonoscopy every 10 years starting at age 67. Hepatitis C blood test. Hepatitis B blood test. Sexually transmitted disease (STD)  testing. Diabetes screening. This is done by checking your blood sugar (glucose) after you have not eaten for a while (fasting). You may have this done every 1-3 years. Bone density scan. This is done to screen for osteoporosis. You may have this done starting at age 13. Mammogram. This may be done every 1-2 years. Talk to your health care provider about how often you should have regular mammograms. Talk with your health care provider about  your test results, treatment options, and if necessary, the need for more tests. Vaccines  Your health care provider may recommend certain vaccines, such as: Influenza vaccine. This is recommended every year. Tetanus, diphtheria, and acellular pertussis (Tdap, Td) vaccine. You may need a Td booster every 10 years. Zoster vaccine. You may need this after age 65. Pneumococcal 13-valent conjugate (PCV13) vaccine. One dose is recommended after age 52. Pneumococcal polysaccharide (PPSV23) vaccine. One dose is recommended after age 69. Talk to your health care provider about which screenings and vaccines you need and how often you need them. This information is not intended to replace advice given to you by your health care provider. Make sure you discuss any questions you have with your health care provider. Document Released: 01/01/2016 Document Revised: 08/24/2016 Document Reviewed: 10/06/2015 Elsevier Interactive Patient Education  2017 [place] Prevention in the Home Falls can cause injuries. They can happen to people of all ages. There are many things you can do to make your home safe and to help prevent falls. What can I do on the outside of my home? Regularly fix the edges of walkways and driveways and fix any cracks. Remove anything that might make you trip as you walk through a door, such as a raised step or threshold. Trim any bushes or trees on the path to your home. Use bright outdoor lighting. Clear any walking paths of anything that might make someone trip, such as rocks or tools. Regularly check to see if handrails are loose or broken. Make sure that both sides of any steps have handrails. Any raised decks and porches should have guardrails on the edges. Have any leaves, snow, or ice cleared regularly. Use sand or salt on walking paths during winter. Clean up any spills in your garage right away. This includes oil or grease spills. What can I do in the bathroom? Use  night lights. Install grab bars by the toilet and in the tub and shower. Do not use towel bars as grab bars. Use non-skid mats or decals in the tub or shower. If you need to sit down in the shower, use a plastic, non-slip stool. Keep the floor dry. Clean up any water that spills on the floor as soon as it happens. Remove soap buildup in the tub or shower regularly. Attach bath mats securely with double-sided non-slip rug tape. Do not have throw rugs and other things on the floor that can make you trip. What can I do in the bedroom? Use night lights. Make sure that you have a light by your bed that is easy to reach. Do not use any sheets or blankets that are too big for your bed. They should not hang down onto the floor. Have a firm chair that has side arms. You can use this for support while you get dressed. Do not have throw rugs and other things on the floor that can make you trip. What can I do in the kitchen? Clean up any spills right away. Avoid walking on wet  floors. Keep items that you use a lot in easy-to-reach places. If you need to reach something above you, use a strong step stool that has a grab bar. Keep electrical cords out of the way. Do not use floor polish or wax that makes floors slippery. If you must use wax, use non-skid floor wax. Do not have throw rugs and other things on the floor that can make you trip. What can I do with my stairs? Do not leave any items on the stairs. Make sure that there are handrails on both sides of the stairs and use them. Fix handrails that are broken or loose. Make sure that handrails are as long as the stairways. Check any carpeting to make sure that it is firmly attached to the stairs. Fix any carpet that is loose or worn. Avoid having throw rugs at the top or bottom of the stairs. If you do have throw rugs, attach them to the floor with carpet tape. Make sure that you have a light switch at the top of the stairs and the bottom of the  stairs. If you do not have them, ask someone to add them for you. What else can I do to help prevent falls? Wear shoes that: Do not have high heels. Have rubber bottoms. Are comfortable and fit you well. Are closed at the toe. Do not wear sandals. If you use a stepladder: Make sure that it is fully opened. Do not climb a closed stepladder. Make sure that both sides of the stepladder are locked into place. Ask someone to hold it for you, if possible. Clearly mark and make sure that you can see: Any grab bars or handrails. First and last steps. Where the edge of each step is. Use tools that help you move around (mobility aids) if they are needed. These include: Canes. Walkers. Scooters. Crutches. Turn on the lights when you go into a dark area. Replace any light bulbs as soon as they burn out. Set up your furniture so you have a clear path. Avoid moving your furniture around. If any of your floors are uneven, fix them. If there are any pets around you, be aware of where they are. Review your medicines with your doctor. Some medicines can make you feel dizzy. This can increase your chance of falling. Ask your doctor what other things that you can do to help prevent falls. This information is not intended to replace advice given to you by your health care provider. Make sure you discuss any questions you have with your health care provider. Document Released: 10/01/2009 Document Revised: 05/12/2016 Document Reviewed: 01/09/2015 Elsevier Interactive Patient Education  2017 [employer].

## 2023-05-31 NOTE — Progress Notes (Signed)
Subjective:   Michaela Rhodes is a 87 y.o. female who presents for Medicare Annual (Subsequent) preventive examination.  Review of Systems    Virtual Visit via Telephone Note  I connected with  Michaela Rhodes on 05/31/23 at  1:30 PM EDT by telephone and verified that I am speaking with the correct person using two identifiers.  Location: Patient: Home Provider: Office Persons participating in the virtual visit: patient/Nurse Health Advisor   I discussed the limitations, risks, security and privacy concerns of performing an evaluation and management service by telephone and the availability of in person appointments. The patient expressed understanding and agreed to proceed.  Interactive audio and video telecommunications were attempted between this nurse and patient, however failed, due to patient having technical difficulties OR patient did not have access to video capability.  We continued and completed visit with audio only.  Some vital signs may be absent or patient reported.   Michaela Rung, LPN  Cardiac Risk Factors include: advanced age (>86men, >36 women);diabetes mellitus;hypertension     Objective:    Today's Vitals   05/31/23 1347  Weight: 216 lb (98 kg)  Height: 5' 2.5" (1.588 m)   Body mass index is 38.88 kg/m.     05/31/2023    1:55 PM 05/27/2022    3:57 PM 02/18/2021   11:35 AM 08/03/2017    8:35 AM 07/12/2017    8:00 AM 05/10/2017    9:55 AM 11/29/2016    3:40 PM  Advanced Directives  Does Patient Have a Medical Advance Directive? Yes Yes Yes Yes No Yes Yes  Type of Estate agent of [place];Living will Healthcare Power of [place];Living will Healthcare Power of [place];Living will Living will  Healthcare Power of [place];Living will Healthcare Power of [place];Living will  Does patient want to make changes to medical advance directive?  No - Patient declined  No - Patient declined  No - Patient declined   Copy of Healthcare Power  of Attorney in Chart? No - copy requested No - copy requested No - copy requested   Yes No - copy requested  Would patient like information on creating a medical advance directive?     No - Patient declined      Current Medications (verified) Outpatient Encounter Medications as of 05/31/2023  Medication Sig   amLODipine (NORVASC) 5 MG tablet Take 1 tablet by mouth once daily   Blood Glucose Monitoring Suppl (ACCU-CHEK GUIDE) w/Device KIT 1 Device by Other route daily. Use to test blood sugar once daily. Dx Code E11.65   calcium-vitamin D (OSCAL WITH D) 500-200 MG-UNIT per tablet Take 1 tablet by mouth daily.   Continuous Blood Gluc Receiver (DEXCOM G7 RECEIVER) DEVI Use as directed to check blood sugar daily   Continuous Blood Gluc Sensor (DEXCOM G7 SENSOR) MISC USE AS NEEDED TO CHECK BLOOD SUGAR DAILY   DULoxetine (CYMBALTA) 30 MG capsule Take one capsule by mouth once daily for one week and then increase to two capsules daily.   ELIQUIS 2.5 MG TABS tablet Take 1 tablet by mouth twice daily   glucose blood (ACCU-CHEK GUIDE) test strip Use as instructed to test blood sugar once daily. Dx Code E11.65   insulin glargine, 2 Unit Dial, (TOUJEO MAX SOLOSTAR) 300 UNIT/ML Solostar Pen INJECT 24 UNITS SUBCUTANEOUSLY ONCE DAILY . APPOINTMENT REQUIRED FOR FUTURE REFILLS   Insulin Pen Needle (BD PEN NEEDLE NANO U/F) 32G X 4 MM MISC Use to inject insulin once daily. Dx Code  E11.65   levothyroxine (SYNTHROID) 137 MCG tablet TAKE 1 TABLET BY MOUTH ONCE DAILY BEFORE BREAKFAST. LABS  WITH  APPOINTMENT  REQUIRED  FOR  GREATER  QUAILITY  OF  REFILLS   lisinopril-hydrochlorothiazide (ZESTORETIC) 20-25 MG tablet Take 1 tablet by mouth once daily   metFORMIN (GLUCOPHAGE-XR) 750 MG 24 hr tablet TAKE 1 TABLET BY MOUTH TWICE DAILY AT 10 AM AND 5PM APPOINTMENT REQUIRED FOR FUTURE REFILLS   metoprolol tartrate (LOPRESSOR) 50 MG tablet Take 1 tablet by mouth twice daily   pravastatin (PRAVACHOL) 40 MG tablet Take 1 tablet  by mouth once daily   Semaglutide,0.25 or 0.5MG /DOS, (OZEMPIC, 0.25 OR 0.5 MG/DOSE,) 2 MG/3ML SOPN INJECT 0.5MG  SUBCUTANEOUS ONCE WEEKLY   No facility-administered encounter medications on file as of 05/31/2023.    Allergies (verified) Penicillins and Keflex [cephalexin]   History: Past Medical History:  Diagnosis Date   Cancer (HCC)    breast cancer   DM2 (diabetes mellitus, type 2) (HCC)    Hyperlipidemia    Hypertension    Hypothyroidism    Panic attacks    mild   SVT (supraventricular tachycardia)    in the past   Wears glasses    Past Surgical History:  Procedure Laterality Date   ABDOMINAL HYSTERECTOMY     BSO as well   APPENDECTOMY     APPLICATION OF A-CELL OF EXTREMITY Left 01/15/2015   Procedure: APPLICATION OF A-CELL OF EXTREMITY;  Surgeon: Wayland Denis, DO;  Location: [facility];  Service: Plastics;  Laterality: Left;   CHOLECYSTECTOMY     I & D EXTREMITY Left 01/15/2015   Procedure: IRRIGATION AND DEBRIDEMENT EXTREMITY;  Surgeon: Wayland Denis, DO;  Location:  SURGERY CENTER;  Service: Plastics;  Laterality: Left;   IR KYPHO EA ADDL LEVEL THORACIC OR LUMBAR  05/10/2017   IR KYPHO THORACIC WITH BONE BIOPSY  05/10/2017   IR RADIOLOGIST EVAL & MGMT  04/28/2017   MASTECTOMY  1985   Left   RECONSTRUCTION BREAST W/ LATISSIMUS DORSI FLAP     TOTAL SHOULDER ARTHROPLASTY  05/27/2012   Procedure: TOTAL SHOULDER ARTHROPLASTY;  Surgeon: [name], MD;  Location: [facility] OR;  Service: Orthopedics;  Laterality: Right;   Family History  Problem Relation Age of Onset   Heart disease Mother    Hypertension Mother    Stroke Father    Hypertension Father    Coronary artery disease Other        family hx of   Diabetes Other        family hx of   Hypertension Other        family hx of   Hypertension Sister    Social History   Socioeconomic History   Marital status: Widowed    Spouse name: Not on file   Number of children: Not on file   Years of  education: Not on file   Highest education level: Not on file  Occupational History   Occupation: retired    Comment: had [employer] business  Tobacco Use   Smoking status: Never   Smokeless tobacco: Never  Vaping Use   Vaping Use: Never used  Substance and Sexual Activity   Alcohol use: No   Drug use: No   Sexual activity: Never  Other Topics Concern   Not on file  Social History Narrative   Not on file   Social Determinants of Health   Financial Resource Strain: Low Risk  (05/31/2023)   Overall Physicist, medical Strain (  CARDIA)    Difficulty of Paying Living Expenses: Not hard at all  Food Insecurity: No Food Insecurity (05/31/2023)   Hunger Vital Sign    Worried About Running Out of Food in the Last Year: Never true    Ran Out of Food in the Last Year: Never true  Transportation Needs: No Transportation Needs (05/31/2023)   PRAPARE - Administrator, Civil Service (Medical): No    Lack of Transportation (Non-Medical): No  Physical Activity: Inactive (05/31/2023)   Exercise Vital Sign    Days of Exercise per Week: 0 days    Minutes of Exercise per Session: 0 min  Stress: No Stress Concern Present (05/31/2023)   [employer] of Occupational Health - Occupational Stress Questionnaire    Feeling of Stress : Not at all  Social Connections: Moderately Integrated (05/31/2023)   Social Connection and Isolation Panel [NHANES]    Frequency of Communication with Friends and Family: More than three times a week    Frequency of Social Gatherings with Friends and Family: More than three times a week    Attends Religious Services: More than 4 times per year    Active Member of [employer] or Organizations: Yes    Attends Banker Meetings: More than 4 times per year    Marital Status: Widowed    Tobacco Counseling Counseling given: Not Answered   Clinical Intake:  Pre-visit preparation completed: No  Pain : No/denies pain Nutrition Risk Assessment:  Has  the patient had any N/V/D within the last 2 months?  No  Does the patient have any non-healing wounds?  No  Has the patient had any unintentional weight loss or weight gain?  No   Diabetes:  Is the patient diabetic?  Yes  If diabetic, was a CBG obtained today?  No  Did the patient bring in their glucometer from home?  No  How often do you monitor your CBG's? Prn.   Financial Strains and Diabetes Management:  Are you having any financial strains with the device, your supplies or your medication? No .  Does the patient want to be seen by Chronic Care Management for management of their diabetes?  No  Would the patient like to be referred to a Nutritionist or for Diabetic Management?  No   Diabetic Exams:  Diabetic Eye Exam: Completed . Overdue for diabetic eye exam. Pt has been advised about the importance in completing this exam. A referral has been placed today. Message sent to referral coordinator for scheduling purposes. Advised pt to expect a call from office referred to regarding appt.  Diabetic Foot Exam: Completed . Pt has been advised about the importance in completing this exam. Pt is scheduled for diabetic foot exam on Followed by PCP.      BMI - recorded: 38.88 Nutritional Status: BMI > 30  Obese Nutritional Risks: None Diabetes: Yes CBG done?: No Did pt. bring in CBG monitor from home?: No  How often do you need to have someone help you when you read instructions, pamphlets, or other written materials from your doctor or pharmacy?: 1 - Never  Diabetic?  Yes  Interpreter Needed?: No  Information entered by :: Theresa Mulligan LPN   Activities of Daily Living    05/31/2023    1:54 PM  In your present state of health, do you have any difficulty performing the following activities:  Hearing? 0  Vision? 0  Difficulty concentrating or making decisions? 0  Walking or climbing stairs?  1  Comment Uses a cane and walker  Dressing or bathing? 0  Doing errands, shopping?  0  Preparing Food and eating ? N  Using the Toilet? N  In the past six months, have you accidently leaked urine? N  Do you have problems with loss of bowel control? N  Managing your Medications? N  Managing your Finances? N  Housekeeping or managing your Housekeeping? N    Patient Care Team: [name], MD as PCP - General (Family Medicine) Caryl Never, Elberta Fortis, MD (Family Medicine) Verner Chol, [facility] (Inactive) as Pharmacist (Pharmacist)  Indicate any recent Medical Services you may have received from other than [facility] providers in the past year (date may be approximate).     Assessment:   This is a routine wellness examination for Emmanuel.  Hearing/Vision screen Hearing Screening - Comments:: Denies hearing difficulties   Vision Screening - Comments:: - up to date with routine eye exams with  [facility]  Dietary issues and exercise activities discussed: Exercise limited by: None identified   Goals Addressed               This Visit's Progress     No current goals (pt-stated)         Depression Screen    05/31/2023    1:53 PM 02/01/2023   11:19 AM 05/27/2022    3:54 PM 02/08/2022   11:33 AM 02/18/2021   11:38 AM 05/26/2020   10:15 AM 12/17/2018    2:21 PM  PHQ 2/9 Scores  PHQ - 2 Score 0 0 0 0 0 0 0  PHQ- 9 Score    2       Fall Risk    05/31/2023    1:54 PM 02/01/2023   11:20 AM 05/27/2022    3:57 PM 05/18/2022    1:38 PM 02/18/2021   11:36 AM  Fall Risk   Falls in the past year? 0 0 0 0 0  Number falls in past yr: 0 0 0 0 0  Injury with Fall? 0 0 0 0 0  Risk for fall due to : No Fall Risks No Fall Risks No Fall Risks No Fall Risks Orthopedic patient;History of fall(s)  Follow up Falls prevention discussed Falls evaluation completed  Falls evaluation completed Falls evaluation completed;Falls prevention discussed    FALL RISK PREVENTION PERTAINING TO THE HOME:  Any stairs in or around the home? Yes  If so, are there any without handrails? No   Home free of loose throw rugs in walkways, pet beds, electrical cords, etc? Yes  Adequate lighting in your home to reduce risk of falls? Yes   ASSISTIVE DEVICES UTILIZED TO PREVENT FALLS:  Life alert? Yes  Use of a cane, walker or w/c? Yes  Grab bars in the bathroom? No  Shower chair or bench in shower? Yes  Elevated toilet seat or a handicapped toilet? No   TIMED UP AND GO:  Was the test performed? No . Audio visit   Cognitive Function:        05/31/2023    1:55 PM 05/27/2022    3:58 PM  6CIT Screen  What Year? 0 points 0 points  What month? 0 points 0 points  What time? 0 points 0 points  Count back from 20 0 points 0 points  Months in reverse 0 points 0 points  Repeat phrase 0 points 0 points  Total Score 0 points 0 points    Immunizations Immunization History  Administered Date(s) Administered   Fluad Quad(high Dose 65+) 08/26/2020   Influenza Split 09/25/2012   Influenza,inj,Quad PF,6+ Mos 08/30/2013, 12/22/2014   PFIZER(Purple Top)SARS-COV-2 Vaccination 01/03/2020, 01/24/2020, 12/08/2020   Pneumococcal Conjugate-13 11/25/2020   Pneumococcal Polysaccharide-23 09/25/2012   Tdap 12/26/2009    TDAP status: Due, Education has been provided regarding the importance of this vaccine. Advised may receive this vaccine at local pharmacy or Health Dept. Aware to provide a copy of the vaccination record if obtained from local pharmacy or Health Dept. Verbalized acceptance and understanding.  Flu Vaccine status: Declined, Education has been provided regarding the importance of this vaccine but patient still declined. Advised may receive this vaccine at local pharmacy or Health Dept. Aware to provide a copy of the vaccination record if obtained from local pharmacy or Health Dept. Verbalized acceptance and understanding.  Pneumococcal vaccine status: Up to date  Covid-19 vaccine status: Completed vaccines  Qualifies for Shingles Vaccine? Yes   Zostavax completed No    Shingrix Completed?: No.    Education has been provided regarding the importance of this vaccine. Patient has been advised to call insurance company to determine out of pocket expense if they have not yet received this vaccine. Advised may also receive vaccine at local pharmacy or Health Dept. Verbalized acceptance and understanding.  Screening Tests Health Maintenance  Topic Date Due   DTaP/Tdap/Td (2 - Td or Tdap) 12/27/2019   OPHTHALMOLOGY EXAM  03/03/2021   FOOT EXAM  08/26/2021   Diabetic kidney evaluation - Urine ACR  06/01/2023 (Originally 06/02/1954)   COVID-19 Vaccine (4 - 2023-24 season) 06/16/2023 (Originally 08/19/2022)   Zoster Vaccines- Shingrix (1 of 2) 08/31/2023 (Originally 06/02/1955)   HEMOGLOBIN A1C  07/03/2023   INFLUENZA VACCINE  07/20/2023   Diabetic kidney evaluation - eGFR measurement  01/03/2024   Medicare Annual Wellness (AWV)  05/30/2024   Pneumonia Vaccine 34+ Years old  Completed   DEXA SCAN  Completed   HPV VACCINES  Aged Out    Health Maintenance  Health Maintenance Due  Topic Date Due   DTaP/Tdap/Td (2 - Td or Tdap) 12/27/2019   OPHTHALMOLOGY EXAM  03/03/2021   FOOT EXAM  08/26/2021    Colorectal cancer screening: No longer required.   Mammogram status: No longer required due to Age.  Bone Density status: Completed 03/03/16. Results reflect: Bone density results: OSTEOPOROSIS. Repeat every   years.  Lung Cancer Screening: (Low Dose CT Chest recommended if Age 40-80 years, 30 pack-year currently smoking OR have quit w/in 15years.) does not qualify.     Additional Screening:  Hepatitis C Screening: does not qualify; Completed   Vision Screening: Recommended annual ophthalmology exams for early detection of glaucoma and other disorders of the eye. Is the patient up to date with their annual eye exam?  Yes  Who is the provider or what is the name of the office in which the patient attends annual eye exams? [facility] If pt is not  established with a provider, would they like to be referred to a provider to establish care? No .   Dental Screening: Recommended annual dental exams for proper oral hygiene  Community Resource Referral / Chronic Care Management:  CRR required this visit?  No   CCM required this visit?  No      Plan:     I have personally reviewed and noted the following in the patient's chart:   Medical and social history Use of alcohol, tobacco or illicit drugs  Current medications  and supplements including opioid prescriptions. Patient is not currently taking opioid prescriptions. Functional ability and status Nutritional status Physical activity Advanced directives List of other physicians Hospitalizations, surgeries, and ER visits in previous 12 months Vitals Screenings to include cognitive, depression, and falls Referrals and appointments  In addition, I have reviewed and discussed with patient certain preventive protocols, quality metrics, and best practice recommendations. A written personalized care plan for preventive services as well as general preventive health recommendations were provided to patient.     Michaela Rung, LPN   03/27/8118   Nurse Notes: Patient due Diabetic kidney evaluation-Urine ACR

## 2023-06-18 ENCOUNTER — Other Ambulatory Visit: Payer: Self-pay | Admitting: Family Medicine

## 2023-06-29 ENCOUNTER — Other Ambulatory Visit: Payer: Self-pay | Admitting: Family Medicine

## 2023-07-10 ENCOUNTER — Other Ambulatory Visit: Payer: Self-pay | Admitting: Family Medicine

## 2023-07-11 ENCOUNTER — Other Ambulatory Visit: Payer: Self-pay | Admitting: Family Medicine

## 2023-08-16 ENCOUNTER — Other Ambulatory Visit: Payer: Self-pay | Admitting: Family Medicine

## 2023-09-11 ENCOUNTER — Other Ambulatory Visit: Payer: Self-pay | Admitting: Family Medicine

## 2023-10-03 ENCOUNTER — Other Ambulatory Visit: Payer: Self-pay | Admitting: Family Medicine

## 2023-10-09 ENCOUNTER — Other Ambulatory Visit: Payer: Self-pay | Admitting: Family Medicine

## 2023-11-03 ENCOUNTER — Other Ambulatory Visit: Payer: Self-pay | Admitting: Family Medicine

## 2023-11-09 ENCOUNTER — Other Ambulatory Visit: Payer: Self-pay | Admitting: Family Medicine

## 2023-12-29 ENCOUNTER — Other Ambulatory Visit: Payer: Self-pay | Admitting: Family Medicine

## 2024-01-06 ENCOUNTER — Other Ambulatory Visit: Payer: Self-pay | Admitting: Family Medicine

## 2024-01-28 ENCOUNTER — Other Ambulatory Visit: Payer: Self-pay | Admitting: Family Medicine

## 2024-02-05 ENCOUNTER — Other Ambulatory Visit: Payer: Self-pay | Admitting: Family Medicine

## 2024-02-16 ENCOUNTER — Ambulatory Visit (INDEPENDENT_AMBULATORY_CARE_PROVIDER_SITE_OTHER): Payer: Medicare HMO | Admitting: Family Medicine

## 2024-02-16 ENCOUNTER — Encounter: Payer: Self-pay | Admitting: Family Medicine

## 2024-02-16 VITALS — BP 124/70 | HR 83 | Temp 97.5°F | Ht 62.5 in | Wt 216.3 lb

## 2024-02-16 DIAGNOSIS — Z86718 Personal history of other venous thrombosis and embolism: Secondary | ICD-10-CM | POA: Diagnosis not present

## 2024-02-16 DIAGNOSIS — M545 Low back pain, unspecified: Secondary | ICD-10-CM

## 2024-02-16 MED ORDER — METHOCARBAMOL 500 MG PO TABS: 500.0000 mg | ORAL_TABLET | Freq: Three times a day (TID) | ORAL | 0 refills | Status: AC | PRN

## 2024-02-16 MED ORDER — APIXABAN 2.5 MG PO TABS: 2.5000 mg | ORAL_TABLET | Freq: Two times a day (BID) | ORAL | 1 refills | Status: DC

## 2024-02-16 MED ORDER — DULOXETINE HCL 30 MG PO CPEP: ORAL_CAPSULE | ORAL | 3 refills | Status: DC

## 2024-02-16 NOTE — Progress Notes (Signed)
 Acute Office Visit   Subjective:  Patient ID: Michaela Rhodes, female    DOB: [birth year], 88 y.o.   MRN: [id number]  Chief Complaint  Patient presents with   Back Pain    Pt c/o back pain. Pt states it is chronic- always have back ache. Worsen a wk. Taking ibuprofen. Hurts to stand up, hurts to get out of bed.     HPI:  Patient reports she has chronic back pain, but become worse after bundling up some books and moving the books about a month ago. She reports the pain is mostly across lumbar spine. Pain described as pressure, intermittent. The pain is constant with certain movements, such as walking or lying flat on the bed, but usually does not hurt with sitting in a chair. Denies any loss of bowel or bladder; or numbness and tingling.  She has been taking Ibuprofen with little relief.   Review of Systems  Musculoskeletal:  Positive for back pain.  See HPI above      Objective:   BP 124/70 (BP Location: Left Arm, Patient Position: Sitting, Cuff Size: Large)   Pulse 83   Temp (!) 97.5 F (36.4 C) (Oral)   Ht 5' 2.5" (1.588 m)   Wt 216 lb 4.8 oz (98.1 kg)   SpO2 98%   BMI 38.93 kg/m    Physical Exam Vitals reviewed.  Constitutional:      General: She is not in acute distress.    Appearance: Normal appearance. She is obese. She is not ill-appearing, toxic-appearing or diaphoretic.  HENT:     Head: Normocephalic and atraumatic.  Eyes:     General:        Right eye: No discharge.        Left eye: No discharge.     Conjunctiva/sclera: Conjunctivae normal.  Cardiovascular:     Rate and Rhythm: Normal rate and regular rhythm.     Heart sounds: Normal heart sounds. No murmur heard.    No friction rub. No gallop.  Pulmonary:     Effort: Pulmonary effort is normal. No respiratory distress.     Breath sounds: Normal breath sounds.  Musculoskeletal:        General: Normal range of motion.     Lumbar back: Tenderness present. No swelling.  Skin:    General: Skin is warm and  dry.  Neurological:     General: No focal deficit present.     Mental Status: She is alert and oriented to person, place, and time. Mental status is at baseline.     Gait: Gait abnormal Randie Heinz).  Psychiatric:        Mood and Affect: Mood normal.        Behavior: Behavior normal.        Thought Content: Thought content normal.        Judgment: Judgment normal.       Assessment & Plan:  Bilateral low back pain without sciatica, unspecified chronicity -     Methocarbamol; Take 1 tablet (500 mg total) by mouth every 8 (eight) hours as needed for up to 5 days.  Dispense: 15 tablet; Refill: 0 -     DULoxetine HCl; Take one capsule by mouth once daily for one week and then increase to two capsules daily.  Dispense: 60 capsule; Refill: 3  History of DVT (deep vein thrombosis) -     Apixaban; Take 1 tablet (2.5 mg total) by mouth 2 (two) times daily.  Dispense: 180 tablet;  Refill: 1  -Recommend to restart Duloxetine (Cymbalta) 30mg  tablet for lower back pain. Start with taking 1 tablet for 7 days, then take 2 tablets daily. Patient was initially started on this medication last year, but didn't refill because she didn't think it helped very much.  -Prescribed Robaxin 500mg  tablet, take 1 tablet every 8 hours as needed for muscle spasms. Caution: This medication can cause drowsiness and please be careful when moving around and walking.  -Refilled Eliquis 2.5mg  tablet. Please take 1 tablet twice a day for a history of blood clots. Patient reports she has not been taking medication for at least a month. She is uncertain if Dr. Caryl Never or another family member who is in health care told her she could stop taking the medication. Reached out to Dr. Caryl Never about this concern. Based on review of chart, back in 08/2022, she was complaining about the cost of medication, but recommended either Xarelto or Coumadin as an alternative. Dr. Caryl Never is going to review her records. If for some reason he does not  think she needs to be on the medication, the office will give her a call.  -Follow up if not improved.   No follow-ups on file.  Zandra Abts, NP

## 2024-02-16 NOTE — Patient Instructions (Addendum)
-  Recommend to restart Duloxetine (Cymbalta) 30mg  tablet for lower back pain. Start with taking 1 tablet for 7 days, then take 2 tablets daily.  -Prescribed Robaxin 500mg  tablet, take 1 tablet every 8 hours as needed for muscle spasms. Caution: This medication can cause drowsiness and please be careful when moving around and walking.  -Refilled Eliquis 2.5mg  tablet. Please take 1 tablet twice a day for a history of blood clots. Dr. Caryl Never is going to review your records. If for some reason he does not think you need to be on the medication, we will give you a call.  -Follow up if not improved.

## 2024-03-03 ENCOUNTER — Other Ambulatory Visit: Payer: Self-pay | Admitting: Family Medicine

## 2024-03-04 ENCOUNTER — Other Ambulatory Visit: Payer: Self-pay | Admitting: Family Medicine

## 2024-03-25 ENCOUNTER — Encounter: Payer: Self-pay | Admitting: Family Medicine

## 2024-03-25 ENCOUNTER — Ambulatory Visit (INDEPENDENT_AMBULATORY_CARE_PROVIDER_SITE_OTHER): Admitting: Family Medicine

## 2024-03-25 VITALS — BP 150/90 | HR 89 | Temp 97.8°F | Wt 212.5 lb

## 2024-03-25 DIAGNOSIS — M545 Low back pain, unspecified: Secondary | ICD-10-CM | POA: Diagnosis not present

## 2024-03-25 DIAGNOSIS — E039 Hypothyroidism, unspecified: Secondary | ICD-10-CM | POA: Diagnosis not present

## 2024-03-25 DIAGNOSIS — I10 Essential (primary) hypertension: Secondary | ICD-10-CM | POA: Diagnosis not present

## 2024-03-25 DIAGNOSIS — Z7984 Long term (current) use of oral hypoglycemic drugs: Secondary | ICD-10-CM | POA: Diagnosis not present

## 2024-03-25 DIAGNOSIS — N1832 Chronic kidney disease, stage 3b: Secondary | ICD-10-CM | POA: Diagnosis not present

## 2024-03-25 DIAGNOSIS — E118 Type 2 diabetes mellitus with unspecified complications: Secondary | ICD-10-CM

## 2024-03-25 MED ORDER — METOPROLOL TARTRATE 50 MG PO TABS: 50.0000 mg | ORAL_TABLET | Freq: Two times a day (BID) | ORAL | 3 refills | Status: AC

## 2024-03-25 MED ORDER — METFORMIN HCL ER 750 MG PO TB24: ORAL_TABLET | ORAL | 3 refills | Status: AC

## 2024-03-25 MED ORDER — TOUJEO MAX SOLOSTAR 300 UNIT/ML ~~LOC~~ SOPN: PEN_INJECTOR | SUBCUTANEOUS | 3 refills | Status: AC

## 2024-03-25 MED ORDER — AMLODIPINE BESYLATE 5 MG PO TABS: 5.0000 mg | ORAL_TABLET | Freq: Every day | ORAL | 3 refills | Status: AC

## 2024-03-25 MED ORDER — PRAVASTATIN SODIUM 40 MG PO TABS: 40.0000 mg | ORAL_TABLET | Freq: Every day | ORAL | 3 refills | Status: AC

## 2024-03-25 MED ORDER — LEVOTHYROXINE SODIUM 137 MCG PO TABS: 137.0000 ug | ORAL_TABLET | Freq: Every day | ORAL | 3 refills | Status: DC

## 2024-03-25 MED ORDER — DULOXETINE HCL 30 MG PO CPEP: ORAL_CAPSULE | ORAL | 3 refills | Status: AC

## 2024-03-25 MED ORDER — LISINOPRIL-HYDROCHLOROTHIAZIDE 20-25 MG PO TABS: 1.0000 | ORAL_TABLET | Freq: Every day | ORAL | 3 refills | Status: AC

## 2024-03-25 NOTE — Progress Notes (Signed)
 Established Patient Office Visit  Subjective   Patient ID: Michaela Rhodes, female    DOB: 01-Jun-1936  Age: 88 y.o. MRN: [id number]  Chief Complaint  Patient presents with   Medication Refill    HPI   Michaela Rhodes is seen for medical follow-up.  She has history of prior DVT, diastolic heart failure, hypertension, type 2 diabetes, hypothyroidism, dyslipidemia, chronic kidney disease stage III, obesity, and past history of breast cancer.  She recently ran out of amlodipine about a week ago.  Compliant with other medications.  Her medications were reviewed and include pravastatin, metoprolol, metformin, lisinopril HCTZ, levothyroxine, Toujeo insulin, Eliquis, and amlodipine.  She was seen back in February with some chronic low back pain was placed on Cymbalta which she thinks may have helped somewhat.  She ran out.  She would like to consider refills.  Did not have any intolerance to taking this.  Currently on 26 units of Toujeo.  No recent hypoglycemic symptoms.  Overdue for follow-up labs.  Denies any recent falls.  Ambulates with a cane.  Past Medical History:  Diagnosis Date   Cancer [facility])    breast cancer   DM2 (diabetes mellitus, type 2) (HCC)    Hyperlipidemia    Hypertension    Hypothyroidism    Panic attacks    mild   SVT (supraventricular tachycardia) (HCC)    in the past   Wears glasses    Past Surgical History:  Procedure Laterality Date   ABDOMINAL HYSTERECTOMY     BSO as well   APPENDECTOMY     APPLICATION OF A-CELL OF EXTREMITY Left 01/15/2015   Procedure: APPLICATION OF A-CELL OF EXTREMITY;  Surgeon: Wayland Denis, DO;  Location: [facility];  Service: Plastics;  Laterality: Left;   CHOLECYSTECTOMY     I & D EXTREMITY Left 01/15/2015   Procedure: IRRIGATION AND DEBRIDEMENT EXTREMITY;  Surgeon: Wayland Denis, DO;  Location: [facility];  Service: Plastics;  Laterality: Left;   IR KYPHO EA ADDL LEVEL THORACIC OR LUMBAR  05/10/2017    IR KYPHO THORACIC WITH BONE BIOPSY  05/10/2017   IR RADIOLOGIST EVAL & MGMT  04/28/2017   MASTECTOMY  1985   Left   RECONSTRUCTION BREAST W/ LATISSIMUS DORSI FLAP     TOTAL SHOULDER ARTHROPLASTY  05/27/2012   Procedure: TOTAL SHOULDER ARTHROPLASTY;  Surgeon: [name], MD;  Location: [facility] OR;  Service: Orthopedics;  Laterality: Right;    reports that she has never smoked. She has never used smokeless tobacco. She reports that she does not drink alcohol and does not use drugs. family history includes Coronary artery disease in an other family member; Diabetes in an other family member; Heart disease in her mother; Hypertension in her father, mother, sister, and another family member; Stroke in her father. Allergies  Allergen Reactions   Penicillins Anaphylaxis and Hives    Has patient had a PCN reaction causing immediate rash, facial/tongue/throat swelling, SOB or lightheadedness with hypotension: Yes Has patient had a PCN reaction causing severe rash involving mucus membranes or skin necrosis: Yes Has patient had a PCN reaction that required hospitalization: No Has patient had a PCN reaction occurring within the last 10 years: Yes If all of the above answers are "NO", then may proceed with Cephalosporin use.    Keflex [Cephalexin]     Rash     Review of Systems  Constitutional:  Negative for chills, fever and malaise/fatigue.  Eyes:  Negative for blurred vision.  Respiratory:  Negative for shortness of breath.   Cardiovascular:  Negative for chest pain.  Neurological:  Negative for dizziness, weakness and headaches.      Objective:     BP (!) 150/90 (BP Location: Left Arm, Patient Position: Sitting, Cuff Size: Normal)   Pulse 89   Temp 97.8 F (36.6 C) (Oral)   Wt 212 lb 8 oz (96.4 kg)   SpO2 96%   BMI 38.25 kg/m  BP Readings from Last 3 Encounters:  03/25/24 (!) 150/90  02/16/24 124/70  03/03/23 138/82   Wt Readings from Last 3 Encounters:  03/25/24 212 lb 8 oz (96.4  kg)  02/16/24 216 lb 4.8 oz (98.1 kg)  05/31/23 216 lb (98 kg)      Physical Exam Vitals reviewed.  Constitutional:      General: She is not in acute distress.    Appearance: She is well-developed. She is not ill-appearing.  Neck:     Thyroid: No thyromegaly.     Vascular: No JVD.  Cardiovascular:     Rate and Rhythm: Normal rate and regular rhythm.     Heart sounds:     No gallop.  Pulmonary:     Effort: Pulmonary effort is normal. No respiratory distress.     Breath sounds: Normal breath sounds. No wheezing or rales.  Musculoskeletal:     Cervical back: Neck supple.     Right lower leg: No edema.     Left lower leg: No edema.  Neurological:     Mental Status: She is alert.      No results found for any visits on 03/25/24.    The ASCVD Risk score (Arnett DK, et al., 2019) failed to calculate for the following reasons:   The 2019 ASCVD risk score is only valid for ages 57 to 74    Assessment & Plan:   #1 history of type 2 diabetes.  Unknown current level of control.  Recheck A1c.  Refill metformin and Toujeo.  Continue yearly diabetic eye exam.  #2 hypertension.  Poorly controlled today but patient off amlodipine for the past week.  Refill amlodipine along with lisinopril HCTZ and metoprolol.  Set up follow-up in a few months to reassess.  #3 hyperlipidemia treated with pravastatin 40 mg daily.  Check lipid and CMP  #4 history of recurrent DVT.  Patient is on low-dose Eliquis for prophylaxis.  #5 hypothyroidism.  Treated with levothyroxine.  Overdue for labs.  Check TSH.  Refill levothyroxine 137 mcg daily  Stressed importance of establishing more consistent primary care follow-up.  Set up 51-month follow-up.   Return in about 3 months (around 06/24/2024).    Michaela Peat, MD

## 2024-03-25 NOTE — Patient Instructions (Signed)
 All of your medications have been renewed  We will call with lab results  Set up 3 month follow up.

## 2024-03-26 LAB — CBC WITH DIFFERENTIAL/PLATELET
Basophils Absolute: 0.1 10*3/uL (ref 0.0–0.1)
Basophils Relative: 0.6 % (ref 0.0–3.0)
Eosinophils Absolute: 0.7 10*3/uL (ref 0.0–0.7)
Eosinophils Relative: 5.5 % — ABNORMAL HIGH (ref 0.0–5.0)
HCT: 42.4 % (ref 36.0–46.0)
Hemoglobin: 14 g/dL (ref 12.0–15.0)
Lymphocytes Relative: 13 % (ref 12.0–46.0)
Lymphs Abs: 1.7 10*3/uL (ref 0.7–4.0)
MCHC: 33 g/dL (ref 30.0–36.0)
MCV: 84.2 fl (ref 78.0–100.0)
Monocytes Absolute: 0.7 10*3/uL (ref 0.1–1.0)
Monocytes Relative: 5.8 % (ref 3.0–12.0)
Neutro Abs: 9.6 10*3/uL — ABNORMAL HIGH (ref 1.4–7.7)
Neutrophils Relative %: 75.1 % (ref 43.0–77.0)
Platelets: 376 10*3/uL (ref 150.0–400.0)
RBC: 5.03 Mil/uL (ref 3.87–5.11)
RDW: 13.4 % (ref 11.5–15.5)
WBC: 12.8 10*3/uL — ABNORMAL HIGH (ref 4.0–10.5)

## 2024-03-26 LAB — COMPREHENSIVE METABOLIC PANEL WITH GFR
ALT: 18 U/L (ref 0–35)
AST: 18 U/L (ref 0–37)
Albumin: 4 g/dL (ref 3.5–5.2)
Alkaline Phosphatase: 167 U/L — ABNORMAL HIGH (ref 39–117)
BUN: 32 mg/dL — ABNORMAL HIGH (ref 6–23)
CO2: 25 meq/L (ref 19–32)
Calcium: 9.8 mg/dL (ref 8.4–10.5)
Chloride: 100 meq/L (ref 96–112)
Creatinine, Ser: 1.39 mg/dL — ABNORMAL HIGH (ref 0.40–1.20)
GFR: 34.53 mL/min — ABNORMAL LOW (ref >60.00)
Glucose, Bld: 235 mg/dL — ABNORMAL HIGH (ref 70–99)
Potassium: 4.6 meq/L (ref 3.5–5.1)
Sodium: 138 meq/L (ref 135–145)
Total Bilirubin: 0.5 mg/dL (ref 0.2–1.2)
Total Protein: 7 g/dL (ref 6.0–8.3)

## 2024-03-26 LAB — LIPID PANEL
Cholesterol: 188 mg/dL (ref 0–200)
HDL: 34.9 mg/dL — ABNORMAL LOW (ref >39.00)
LDL Cholesterol: 107 mg/dL — ABNORMAL HIGH (ref 0–99)
NonHDL: 152.83
Total CHOL/HDL Ratio: 5
Triglycerides: 228 mg/dL — ABNORMAL HIGH (ref 0.0–149.0)
VLDL: 45.6 mg/dL — ABNORMAL HIGH (ref 0.0–40.0)

## 2024-03-26 LAB — HEMOGLOBIN A1C: Hgb A1c MFr Bld: 9.8 % — ABNORMAL HIGH (ref 4.6–6.5)

## 2024-03-26 LAB — TSH: TSH: 0.03 u[IU]/mL — ABNORMAL LOW (ref 0.35–5.50)

## 2024-03-27 NOTE — Addendum Note (Signed)
 Addended by: Christy Sartorius on: 03/27/2024 07:18 AM   Modules accepted: Orders

## 2024-04-02 ENCOUNTER — Other Ambulatory Visit: Payer: Self-pay | Admitting: *Deleted

## 2024-04-02 MED ORDER — LEVOTHYROXINE SODIUM 125 MCG PO TABS: 125.0000 ug | ORAL_TABLET | Freq: Every day | ORAL | 1 refills | Status: DC

## 2024-04-18 ENCOUNTER — Telehealth: Payer: Self-pay | Admitting: *Deleted

## 2024-04-18 NOTE — Telephone Encounter (Signed)
 Copied from CRM [id number]. Topic: General - Other >> Apr 18, 2024  3:03 PM Michaela Rhodes wrote: Reason for CRM: Patient called in regarding wanting to know the requirements of getting an handicapped license plate, would like for someone to give her a call regarding this    [phone]

## 2024-04-23 ENCOUNTER — Telehealth: Payer: Self-pay

## 2024-04-23 NOTE — Telephone Encounter (Signed)
 E2c2 agent will let pt know we will call her once form has been completed and ready for pick up

## 2024-04-23 NOTE — Telephone Encounter (Signed)
 Copied from CRM [id number]. Topic: General - Other >> Apr 23, 2024  9:45 AM [place] La N wrote: Reason for CRM: Patient is wanting her handicap form filled out as soon as possible and she is wanting for someone to reach out.

## 2024-05-01 NOTE — Telephone Encounter (Signed)
 Agent sent crm pt is checking on the status of handicap form

## 2024-05-01 NOTE — Telephone Encounter (Signed)
 Left message for patient to return my call.

## 2024-05-02 ENCOUNTER — Telehealth: Payer: Self-pay | Admitting: *Deleted

## 2024-05-02 NOTE — Telephone Encounter (Signed)
 Copied from CRM [id number]. Topic: General - Other >> May 02, 2024  9:17 AM Juleen Oakland F wrote: Reason for CRM: Patient returned [place] phone call

## 2024-05-02 NOTE — Telephone Encounter (Signed)
 Left message for patient to return my call.

## 2024-05-02 NOTE — Telephone Encounter (Signed)
 Please see previous encounter

## 2024-05-03 NOTE — Telephone Encounter (Signed)
 Left message for patient to return my call.

## 2024-05-06 NOTE — Telephone Encounter (Signed)
 Left a message for the patient to return my call.

## 2024-06-24 ENCOUNTER — Ambulatory Visit: Admitting: Family Medicine

## 2024-07-03 ENCOUNTER — Ambulatory Visit: Payer: Self-pay | Admitting: Family Medicine

## 2024-07-03 ENCOUNTER — Ambulatory Visit: Admitting: Family Medicine

## 2024-07-03 VITALS — BP 130/78

## 2024-07-03 DIAGNOSIS — E039 Hypothyroidism, unspecified: Secondary | ICD-10-CM | POA: Diagnosis not present

## 2024-07-03 DIAGNOSIS — I10 Essential (primary) hypertension: Secondary | ICD-10-CM

## 2024-07-03 DIAGNOSIS — E118 Type 2 diabetes mellitus with unspecified complications: Secondary | ICD-10-CM | POA: Diagnosis not present

## 2024-07-03 DIAGNOSIS — Z7984 Long term (current) use of oral hypoglycemic drugs: Secondary | ICD-10-CM | POA: Diagnosis not present

## 2024-07-03 LAB — POCT GLYCOSYLATED HEMOGLOBIN (HGB A1C): Hemoglobin A1C: 10.1 % — AB (ref 4.0–5.6)

## 2024-07-03 LAB — TSH: TSH: 0.01 u[IU]/mL — ABNORMAL LOW (ref 0.35–5.50)

## 2024-07-03 MED ORDER — GLIPIZIDE 5 MG PO TABS: 5.0000 mg | ORAL_TABLET | Freq: Every day | ORAL | 5 refills | Status: AC

## 2024-07-03 NOTE — Patient Instructions (Signed)
 Start the Glipizide  5 mg once daily  Set up 3 month follow up.    A1C today up to 10.1%.

## 2024-07-03 NOTE — Progress Notes (Unsigned)
   Established Patient Office Visit  Subjective   Patient ID: Michaela Rhodes, female    DOB: 02/22/1936  Age: 88 y.o. MRN: [id number]  Chief Complaint  Patient presents with  . Medical Management of Chronic Issues    HPI  {History (Optional):23778} Michaela Rhodes is seen for chronic management follow-up.  She has past medical history significant for diastolic heart failure, hypertension, past history of DVT, type 2 diabetes, hypothyroidism, chronic kidney disease with baseline GFR around 35, history of breast cancer, hyperlipidemia.  She was here in April and had thyroid  over place with T SH 0.03.  We reduced her dose of levothyroxine  and she needs follow-up today.  Type 2 diabetes with history of poor control.  She send a little bit unsure of medications today. ROS    Objective:     BP 130/78 (BP Location: Left Arm, Cuff Size: Normal)  {Vitals History (Optional):23777}  Physical Exam   Results for orders placed or performed in visit on 07/03/24  POC HgB A1c  Result Value Ref Range   Hemoglobin A1C 10.1 (A) 4.0 - 5.6 %   HbA1c POC (<> result, manual entry)     HbA1c, POC (prediabetic range)     HbA1c, POC (controlled diabetic range)      {Labs (Optional):23779}  The ASCVD Risk score (Arnett DK, et al., 2019) failed to calculate for the following reasons:   The 2019 ASCVD risk score is only valid for ages 5 to 33    Assessment & Plan:   Problem List Items Addressed This Visit       Unprioritized   Hypothyroidism   Relevant Orders   TSH   Diabetes mellitus with complication (HCC) - Primary   Relevant Medications   glipiZIDE  (GLUCOTROL ) 5 MG tablet   Other Relevant Orders   POC HgB A1c (Completed)    Return in about 3 months (around 10/03/2024).    Wolm Scarlet, MD

## 2024-07-09 MED ORDER — LEVOTHYROXINE SODIUM 100 MCG PO TABS: 100.0000 ug | ORAL_TABLET | Freq: Every day | ORAL | 0 refills | Status: DC

## 2024-07-09 NOTE — Addendum Note (Signed)
 Addended by: METTA KRISTEN CROME on: 07/09/2024 04:45 PM   Modules accepted: Orders

## 2024-07-22 ENCOUNTER — Telehealth: Payer: Self-pay | Admitting: Family Medicine

## 2024-07-22 NOTE — Telephone Encounter (Signed)
 I have not attempted to reach out to patient at this time

## 2024-07-22 NOTE — Telephone Encounter (Signed)
 Copied from CRM [id number]. Topic: General - Call Back - No Documentation >> Jul 22, 2024 10:48 AM Robinson H wrote: Reason for CRM: Patient states she missed a call from [place], no new messages or notes in system, please reach back out.  Joliene [phone]

## 2024-09-25 ENCOUNTER — Other Ambulatory Visit: Payer: Self-pay | Admitting: Family Medicine

## 2024-09-25 ENCOUNTER — Ambulatory Visit: Admitting: Family Medicine

## 2024-09-25 DIAGNOSIS — Z86718 Personal history of other venous thrombosis and embolism: Secondary | ICD-10-CM

## 2024-10-07 DIAGNOSIS — C44319 Basal cell carcinoma of skin of other parts of face: Secondary | ICD-10-CM | POA: Diagnosis not present

## 2024-10-07 DIAGNOSIS — C44311 Basal cell carcinoma of skin of nose: Secondary | ICD-10-CM | POA: Diagnosis not present

## 2024-11-06 DIAGNOSIS — C44311 Basal cell carcinoma of skin of nose: Secondary | ICD-10-CM | POA: Diagnosis not present
